# Patient Record
Sex: Male | Born: 1950 | Race: Black or African American | Hispanic: No | Marital: Married | Smoking: Former smoker
Health system: Southern US, Community
[De-identification: ages and names within clinical notes are randomized; demographics above are authoritative.]

## PROBLEM LIST (undated history)

## (undated) DIAGNOSIS — Z87891 Personal history of nicotine dependence: Secondary | ICD-10-CM

## (undated) DIAGNOSIS — M1990 Unspecified osteoarthritis, unspecified site: Secondary | ICD-10-CM

## (undated) DIAGNOSIS — Z550 Illiteracy and low-level literacy: Secondary | ICD-10-CM

## (undated) DIAGNOSIS — I10 Essential (primary) hypertension: Secondary | ICD-10-CM

## (undated) DIAGNOSIS — Z860100 Personal history of colon polyps, unspecified: Secondary | ICD-10-CM

## (undated) DIAGNOSIS — I442 Atrioventricular block, complete: Secondary | ICD-10-CM

## (undated) DIAGNOSIS — Z952 Presence of prosthetic heart valve: Secondary | ICD-10-CM

## (undated) DIAGNOSIS — E119 Type 2 diabetes mellitus without complications: Secondary | ICD-10-CM

## (undated) DIAGNOSIS — I2510 Atherosclerotic heart disease of native coronary artery without angina pectoris: Secondary | ICD-10-CM

## (undated) DIAGNOSIS — D649 Anemia, unspecified: Secondary | ICD-10-CM

## (undated) DIAGNOSIS — I739 Peripheral vascular disease, unspecified: Secondary | ICD-10-CM

## (undated) DIAGNOSIS — E785 Hyperlipidemia, unspecified: Secondary | ICD-10-CM

## (undated) DIAGNOSIS — K219 Gastro-esophageal reflux disease without esophagitis: Secondary | ICD-10-CM

## (undated) DIAGNOSIS — Z950 Presence of cardiac pacemaker: Secondary | ICD-10-CM

## (undated) DIAGNOSIS — Z45018 Encounter for adjustment and management of other part of cardiac pacemaker: Secondary | ICD-10-CM

## (undated) HISTORY — DX: Presence of cardiac pacemaker: Z95.0

## (undated) HISTORY — DX: Illiteracy and low-level literacy: Z55.0

## (undated) HISTORY — DX: Hyperlipidemia, unspecified: E78.5

## (undated) HISTORY — DX: Essential (primary) hypertension: I10

## (undated) HISTORY — DX: Presence of prosthetic heart valve: Z95.2

## (undated) HISTORY — DX: Personal history of colon polyps, unspecified: Z86.0100

## (undated) HISTORY — DX: Type 2 diabetes mellitus without complications: E11.9

## (undated) HISTORY — DX: Personal history of nicotine dependence: Z87.891

## (undated) HISTORY — DX: Anemia, unspecified: D64.9

## (undated) HISTORY — DX: Personal history of colonic polyps: Z86.010

## (undated) HISTORY — DX: Peripheral vascular disease, unspecified: I73.9

---
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
## 2000-08-23 ENCOUNTER — Ambulatory Visit (HOSPITAL_COMMUNITY): Admission: RE | Admit: 2000-08-23 | Discharge: 2000-08-23 | Payer: Self-pay | Admitting: Gastroenterology

## 2000-08-23 ENCOUNTER — Encounter (INDEPENDENT_AMBULATORY_CARE_PROVIDER_SITE_OTHER): Payer: Self-pay | Admitting: *Deleted

## 2006-09-06 ENCOUNTER — Emergency Department (HOSPITAL_COMMUNITY): Admission: EM | Admit: 2006-09-06 | Discharge: 2006-09-06 | Payer: Self-pay | Admitting: Family Medicine

## 2006-10-28 ENCOUNTER — Encounter: Admission: RE | Admit: 2006-10-28 | Discharge: 2006-10-28 | Payer: Self-pay | Admitting: Internal Medicine

## 2006-12-27 HISTORY — PX: COLONOSCOPY: SHX174

## 2007-01-06 LAB — HM COLONOSCOPY

## 2007-08-01 ENCOUNTER — Encounter: Admission: RE | Admit: 2007-08-01 | Discharge: 2007-08-01 | Payer: Self-pay | Admitting: Internal Medicine

## 2010-06-08 ENCOUNTER — Encounter: Admission: RE | Admit: 2010-06-08 | Discharge: 2010-06-08 | Payer: Self-pay | Admitting: Internal Medicine

## 2010-12-27 DIAGNOSIS — I10 Essential (primary) hypertension: Secondary | ICD-10-CM

## 2010-12-27 HISTORY — DX: Essential (primary) hypertension: I10

## 2011-01-17 ENCOUNTER — Encounter: Payer: Self-pay | Admitting: Internal Medicine

## 2011-05-14 NOTE — Procedures (Signed)
[place]. [facility]  Patient:    EDDRICK, DILONE                    MRN: [id number] Proc. Date: 08/23/00 Adm. Date:  [id number] Attending:  Nelda Marseille CC:         Hadassah Pais. Jeannetta Nap, M.D.   Procedure Report  PROCEDURE:  Colonoscopy with biopsy.  SURGEON:  Petra Kuba, M.D.  INDICATIONS:  Guaiac positivity.  Consent was signed after risks, benefits, methods, and options were thoroughly discussed in the office.  MEDICINES USED:  Demerol 50 and Versed 5.  DESCRIPTION OF PROCEDURE:  Rectal inspection is pertinent for small external hemorrhoids.  Digital exam was negative.  The video colonoscope was inserted and easily advanced around the colon to the cecum.  This did not require any abdominal pressure or any position changes.  The cecum was identified by the appendiceal orifice and the ileocecal valve.  In fact, the scope was inserted a short ways in the terminal ileum which was normal.  Photo documentation was obtained.  The scope was slowly withdrawn.  The prep was adequate.  There was minimal liquid stool that required washing and suctioning.  On slow withdrawal through the colon, the cecum, ascending, and transverse were normal.  In the mid descending, there was a slightly edematous fold that had somewhat polypoid appearance to it, and we went ahead and took two cold biopsies.  I doubt it was a polyp.  The scope was further withdrawn.  No other abnormalities were seen as we slowly withdrew back to the rectum.  Once back in the rectum, the scope was retroflexed and pertinent for some internal hemorrhoids.  The scope was straightened.  Anorectal pull through confirmed the above.  The scope was then reinserted a short ways of the left side of the colon.  Air was suctioned, the scope removed.  The patient tolerated the procedure well.  There was no obvious or immediate complication.  ENDOSCOPIC DIAGNOSES: 1. Internal and external  hemorrhoids. 2. Questionable tiny mid descending polyps, status post cold biopsy. 3. Otherwise within normal limits to the terminal ileum.  PLAN:  Await pathology to see if and when future colonic screening is needed. Followup p.r.n. or in six weeks to recheck guaiac symptoms, and decided any other workup and plans like a one-time upper GI small bowel follow through. Will await a CBC and certainly if _________ anemic, will proceed with workup sooner. DD:  08/23/00 TD:  08/24/00 Job: [id number]

## 2012-12-27 DIAGNOSIS — E119 Type 2 diabetes mellitus without complications: Secondary | ICD-10-CM

## 2012-12-27 HISTORY — DX: Type 2 diabetes mellitus without complications: E11.9

## 2013-12-26 ENCOUNTER — Encounter: Payer: Self-pay | Admitting: *Deleted

## 2014-01-01 ENCOUNTER — Ambulatory Visit: Payer: Self-pay | Admitting: Cardiovascular Disease

## 2014-03-25 ENCOUNTER — Encounter (HOSPITAL_COMMUNITY): Payer: Self-pay | Admitting: Pharmacy Technician

## 2014-04-02 ENCOUNTER — Ambulatory Visit (HOSPITAL_COMMUNITY)
Admission: RE | Admit: 2014-04-02 | Discharge: 2014-04-02 | Disposition: A | Discharge status: Home / Self Care | Payer: BC Managed Care – PPO | Source: Ambulatory Visit | Attending: Cardiology | Admitting: Cardiology

## 2014-04-02 ENCOUNTER — Encounter (HOSPITAL_COMMUNITY)
Admission: RE | Disposition: A | Discharge status: Home / Self Care | Payer: Self-pay | Source: Ambulatory Visit | Attending: Cardiology

## 2014-04-02 DIAGNOSIS — E785 Hyperlipidemia, unspecified: Secondary | ICD-10-CM | POA: Insufficient documentation

## 2014-04-02 DIAGNOSIS — I10 Essential (primary) hypertension: Secondary | ICD-10-CM | POA: Insufficient documentation

## 2014-04-02 DIAGNOSIS — I359 Nonrheumatic aortic valve disorder, unspecified: Secondary | ICD-10-CM | POA: Insufficient documentation

## 2014-04-02 DIAGNOSIS — Q231 Congenital insufficiency of aortic valve: Secondary | ICD-10-CM | POA: Insufficient documentation

## 2014-04-02 HISTORY — PX: LEFT AND RIGHT HEART CATHETERIZATION WITH CORONARY ANGIOGRAM: SHX5449

## 2014-04-02 LAB — POCT I-STAT 3, VENOUS BLOOD GAS (G3P V)
Acid-base deficit: 4 mmol/L — ABNORMAL HIGH (ref 0.0–2.0)
Bicarbonate: 21.2 mEq/L (ref 20.0–24.0)
O2 Saturation: 64 %
TCO2: 22 mmol/L (ref 0–100)
pCO2, Ven: 40 mmHg — ABNORMAL LOW (ref 45.0–50.0)
pH, Ven: 7.332 — ABNORMAL HIGH (ref 7.250–7.300)
pO2, Ven: 36 mmHg (ref 30.0–45.0)

## 2014-04-02 LAB — POCT I-STAT 3, ART BLOOD GAS (G3+)
Acid-base deficit: 5 mmol/L — ABNORMAL HIGH (ref 0.0–2.0)
Acid-base deficit: 5 mmol/L — ABNORMAL HIGH (ref 0.0–2.0)
Bicarbonate: 19.5 mEq/L — ABNORMAL LOW (ref 20.0–24.0)
Bicarbonate: 20.6 mEq/L (ref 20.0–24.0)
O2 Saturation: 69 %
O2 Saturation: 99 %
PCO2 ART: 38.6 mmHg (ref 35.0–45.0)
PH ART: 7.334 — AB (ref 7.350–7.450)
TCO2: 21 mmol/L (ref 0–100)
TCO2: 22 mmol/L (ref 0–100)
pCO2 arterial: 34.3 mmHg — ABNORMAL LOW (ref 35.0–45.0)
pH, Arterial: 7.362 (ref 7.350–7.450)
pO2, Arterial: 119 mmHg — ABNORMAL HIGH (ref 80.0–100.0)
pO2, Arterial: 38 mmHg — CL (ref 80.0–100.0)

## 2014-04-02 LAB — GLUCOSE, CAPILLARY: GLUCOSE-CAPILLARY: 130 mg/dL — AB (ref 70–99)

## 2014-04-02 SURGERY — LEFT AND RIGHT HEART CATHETERIZATION WITH CORONARY ANGIOGRAM
Anesthesia: LOCAL

## 2014-04-02 MED ORDER — MIDAZOLAM HCL 2 MG/2ML IJ SOLN
INTRAMUSCULAR | Status: AC
  Filled 2014-04-02: qty 2

## 2014-04-02 MED ORDER — ASPIRIN 81 MG PO CHEW
81.0000 mg | CHEWABLE_TABLET | ORAL | Status: AC
  Administered 2014-04-02: 81 mg via ORAL
  Filled 2014-04-02: qty 1

## 2014-04-02 MED ORDER — NITROGLYCERIN 0.2 MG/ML ON CALL CATH LAB
INTRAVENOUS | Status: AC
  Filled 2014-04-02: qty 1

## 2014-04-02 MED ORDER — SODIUM CHLORIDE 0.9 % IJ SOLN: 3.0000 mL | INTRAMUSCULAR | Status: DC | PRN

## 2014-04-02 MED ORDER — SODIUM CHLORIDE 0.9 % IV SOLN
INTRAVENOUS | Status: DC
  Administered 2014-04-02: 10:00:00 via INTRAVENOUS

## 2014-04-02 MED ORDER — LIDOCAINE HCL (PF) 1 % IJ SOLN
INTRAMUSCULAR | Status: AC
  Filled 2014-04-02: qty 30

## 2014-04-02 MED ORDER — HEPARIN (PORCINE) IN NACL 2-0.9 UNIT/ML-% IJ SOLN
INTRAMUSCULAR | Status: AC
  Filled 2014-04-02: qty 1000

## 2014-04-02 MED ORDER — SODIUM CHLORIDE 0.9 % IJ SOLN
3.0000 mL | Freq: Two times a day (BID) | INTRAMUSCULAR | Status: DC
Start: 2014-04-02 — End: 2014-04-02
  Administered 2014-04-02: 3 mL via INTRAVENOUS

## 2014-04-02 MED ORDER — SODIUM CHLORIDE 0.9 % IV BOLUS (SEPSIS)
500.0000 mL | Freq: Once | INTRAVENOUS | Status: AC
  Administered 2014-04-02: 09:00:00 via INTRAVENOUS

## 2014-04-02 MED ORDER — SODIUM CHLORIDE 0.9 % IV SOLN: 250.0000 mL | INTRAVENOUS | Status: DC | PRN

## 2014-04-02 MED ORDER — VERAPAMIL HCL 2.5 MG/ML IV SOLN
INTRAVENOUS | Status: AC
Start: 2014-04-02 — End: 2014-04-02
  Filled 2014-04-02: qty 2

## 2014-04-02 MED ORDER — HYDROMORPHONE HCL PF 1 MG/ML IJ SOLN
INTRAMUSCULAR | Status: AC
  Filled 2014-04-02: qty 1

## 2014-04-02 MED ORDER — SODIUM CHLORIDE 0.9 % IV SOLN
1.0000 mL/kg/h | INTRAVENOUS | Status: DC
Start: 2014-04-02 — End: 2014-04-02

## 2014-04-02 MED ORDER — HEPARIN SODIUM (PORCINE) 1000 UNIT/ML IJ SOLN
INTRAMUSCULAR | Status: AC
  Filled 2014-04-02: qty 1

## 2014-04-02 MED ORDER — METFORMIN HCL 500 MG PO TABS
500.0000 mg | ORAL_TABLET | Freq: Two times a day (BID) | ORAL | Status: DC
Start: 2014-04-04 — End: 2016-02-03

## 2014-04-02 NOTE — H&P (Signed)
  Please see office visit notes for complete details of HPI.  

## 2014-04-02 NOTE — Discharge Instructions (Signed)
° °  HOLD METFORMIN FOR 48 HOURS ° ° °Radial Site Care °Refer to this sheet in the next few weeks. These instructions provide you with information on caring for yourself after your procedure. Your caregiver may also give you more specific instructions. Your treatment has been planned according to current medical practices, but problems sometimes occur. Call your caregiver if you have any problems or questions after your procedure. °HOME CARE INSTRUCTIONS °· You may shower the day after the procedure. Remove the bandage (dressing) and gently wash the site with plain soap and water. Gently pat the site dry. °· Do not apply powder or lotion to the site. °· Do not submerge the affected site in water for 3 to 5 days. °· Inspect the site at least twice daily. °· Do not flex or bend the affected arm for 24 hours. °· No lifting over 5 pounds (2.3 kg) for 5 days after your procedure. °· Do not drive home if you are discharged the same day of the procedure. Have someone else drive you. °· You may drive 24 hours after the procedure unless otherwise instructed by your caregiver. °· Do not operate machinery or power tools for 24 hours. °· A responsible adult should be with you for the first 24 hours after you arrive home. °What to expect: °· Any bruising will usually fade within 1 to 2 weeks. °· Blood that collects in the tissue (hematoma) may be painful to the touch. It should usually decrease in size and tenderness within 1 to 2 weeks. °SEEK IMMEDIATE MEDICAL CARE IF: °· You have unusual pain at the radial site. °· You have redness, warmth, swelling, or pain at the radial site. °· You have drainage (other than a small amount of blood on the dressing). °· You have chills. °· You have a fever or persistent symptoms for more than 72 hours. °· You have a fever and your symptoms suddenly get worse. °· Your arm becomes pale, cool, tingly, or numb. °· You have heavy bleeding from the site. Hold pressure on the site. °Document  Released: 01/15/2011 Document Revised: 03/06/2012 Document Reviewed: 01/15/2011 °ExitCare® Patient Information ©2014 [employer]. ° °

## 2014-04-02 NOTE — Interval H&P Note (Signed)
History and Physical Interval Note:  04/02/2014 11:50 AM  Jonathan Brown  has presented today for surgery, with the diagnosis of c/p  The various methods of treatment have been discussed with the patient and family. After consideration of risks, benefits and other options for treatment, the patient has consented to  Procedure(s): LEFT AND RIGHT HEART CATHETERIZATION WITH CORONARY ANGIOGRAM (N/A) as a surgical intervention .  The patient's history has been reviewed, patient examined, no change in status, stable for surgery.  I have reviewed the patient's chart and labs.  Questions were answered to the patient's satisfaction.     Pamella PertGANJI,JAGADEESH R

## 2014-04-02 NOTE — CV Procedure (Addendum)
Procedures performed: Right and left heart catheterization, coronary arteriograms only without left ventriculogram and calculation of cardiac output and cardiac index by Fick. Right radial arterial access and right antecubital vein access was utilized for performing the procedure. Ascending aortogram and descending thoracic aortogram.  Indication: Patient is a 63 year-old [place] American male with hyperlipidemia, hypertension,  severe aortic stenosis by outpatient 2-D echocardiogram and abnormal stress test in the inferior wall. He presents with shortness of breath and dyspnea and exertion. Patient will need aortic valve replacement and hence also set up for coronary angiography and also aortogram to evaluate his anatomy. Aortogram was performed to evaluate for feasibility for minimally invasive surgery.  Procedural data:  RA pressure 5/5  Mean 3 mm mercury. RA saturation 69 %.  RV pressure 24/5 and Right ventricular EDP 6 mm Hg. PA pressure 27/11 with a mean of 19  mm mercury. PA saturation 64 %.  Pulmonary capillary wedge 7/6 with a mean of 5 mm Hg. Aortic saturation 99 %.  Cardiac output was 4.54 with cardiac index of 2.25, normal  by Fick.   Angiographic data  Left ventricle: NOT performed.    Right coronary artery: Smooth, dominant and has mild diffuse luminal irregularity.   Left main coronary artery: Normal. No stenosis.    LAD: Large, has mild diffuse luminal irregularity and mild proximal coronary calcification.  Circumflex coronary artery: Has mild diffuse luminal irregularity.  Ramus intermediate 1: Large vessel with mild disease in the proximal segment. Next  Ramus intermediate 2: Large vessel with midsegment showing very minimal luminal irregularity.  Ascending aortogram: The aortic valve was moderate to severely calcified. Bicuspid aortic valve. No significant aortic regurgitation. There is no evidence of ascending aortic aneurysm.   Descending thoracic aortogram:  Descending thoracic aorta was normal. Abdominal aorta was also well visualized. The mesenteric vessels were widely patent including the renal arteries. The femoral artery is widely patent bilaterally in the proximal segment, distal vessel not visualized.  Impression: Normal right heart catheterizaton with very mild coronary artery luminal irregularity, no significant stenosis. Left ventriculogram was not performed, aortic valve area not palpated due to inability to cross the aortic valve. No significant attempt was made to cross the aortic valve as mean gradient by transthoracic echocardiogram was suggestive of severe aortic stenosis.   Recommendation: Patient will be set up for a outpatient TEE to confirm the stenosis severity of the aortic valve. Once confirmed, he will need to be referred for aortic valve replacement.  Technique: A 5 French brachial short sheath introduced into the right antecubital vein access. A 5 French Swan-Ganz catheter was advanced with balloon inflated on the sheath under fluoroscopic guidance along with a 0.025 inch J-wire for support into first the right atrium followed by the right ventricle and into the pulmonary artery to pulmonary artery wedge position. Hemodynamics were obtained in a locations.  After hemodynamics were completed, samples were taken for SaO2% measurement to be used in [facility] /Index catheterization.  The catheter was then pulled back the balloon down and then placed in the axillary vein and we proceeded with completion of left heart catheterization.  Left Heart Catheterization   Right radial arterial access was obtained in a 6 French sheath was introduced into the right radial artery. A 5 [place]  Tig 4 diagnostic catheter was utilized over a J-wire 2 advanced into the descending aorta and selective right and left coronary arteriograms was performed. The same catheter was then utilized to perform a sling and aortic  root arteriogram and descending thoracic and  abdominal aortogram. The catheter was then pulled out of the body over J-wire. Hemostasis was obtained by applying TR band of the arterial access site. Venous hemostasis was obtained by applying manual pressure. There was no immediate complication.  The catheter was then removed the body over wire. All exchanges were made over standard J wire.

## 2014-04-30 ENCOUNTER — Ambulatory Visit (HOSPITAL_COMMUNITY)
Admission: RE | Admit: 2014-04-30 | Discharge: 2014-04-30 | Disposition: A | Discharge status: Home / Self Care | Payer: BC Managed Care – PPO | Source: Ambulatory Visit | Attending: Cardiology | Admitting: Cardiology

## 2014-04-30 ENCOUNTER — Encounter (HOSPITAL_COMMUNITY)
Admission: RE | Disposition: A | Discharge status: Home / Self Care | Payer: Self-pay | Source: Ambulatory Visit | Attending: Cardiology

## 2014-04-30 ENCOUNTER — Encounter (HOSPITAL_COMMUNITY): Payer: Self-pay

## 2014-04-30 DIAGNOSIS — I359 Nonrheumatic aortic valve disorder, unspecified: Secondary | ICD-10-CM | POA: Insufficient documentation

## 2014-04-30 HISTORY — DX: Gastro-esophageal reflux disease without esophagitis: K21.9

## 2014-04-30 HISTORY — PX: TEE WITHOUT CARDIOVERSION: SHX5443

## 2014-04-30 LAB — GLUCOSE, CAPILLARY: Glucose-Capillary: 94 mg/dL (ref 70–99)

## 2014-04-30 SURGERY — ECHOCARDIOGRAM, TRANSESOPHAGEAL
Anesthesia: Moderate Sedation

## 2014-04-30 MED ORDER — FENTANYL CITRATE 0.05 MG/ML IJ SOLN
INTRAMUSCULAR | Status: DC | PRN
  Administered 2014-04-30 (×2): 25 ug via INTRAVENOUS

## 2014-04-30 MED ORDER — SODIUM CHLORIDE 0.9 % IV SOLN
INTRAVENOUS | Status: DC
  Administered 2014-04-30: 500 mL via INTRAVENOUS

## 2014-04-30 MED ORDER — BUTAMBEN-TETRACAINE-BENZOCAINE 2-2-14 % EX AERO
INHALATION_SPRAY | CUTANEOUS | Status: DC | PRN
  Administered 2014-04-30: 2 via TOPICAL

## 2014-04-30 MED ORDER — FENTANYL CITRATE 0.05 MG/ML IJ SOLN
INTRAMUSCULAR | Status: AC
  Filled 2014-04-30: qty 2

## 2014-04-30 MED ORDER — MIDAZOLAM HCL 10 MG/2ML IJ SOLN
INTRAMUSCULAR | Status: DC | PRN
Start: 2014-04-30 — End: 2014-04-30
  Administered 2014-04-30 (×2): 2 mg via INTRAVENOUS

## 2014-04-30 MED ORDER — MIDAZOLAM HCL 5 MG/ML IJ SOLN
INTRAMUSCULAR | Status: AC
  Filled 2014-04-30: qty 1

## 2014-04-30 NOTE — Interval H&P Note (Signed)
History and Physical Interval Note:  04/30/2014 11:21 AM  Jonathan Brown  has presented today for surgery, with the diagnosis of AORTIC STENOSIS  The various methods of treatment have been discussed with the patient and family. After consideration of risks, benefits and other options for treatment, the patient has consented to  Procedure(s): TRANSESOPHAGEAL ECHOCARDIOGRAM (TEE) (N/A) as a surgical intervention .  The patient's history has been reviewed, patient examined, no change in status, stable for surgery.  I have reviewed the patient's chart and labs.  Questions were answered to the patient's satisfaction.   Patient has no new symptoms. No changes in medications. Physical exam unchanged and stable for the procedure. Has severe aortic stenosis by TTE.   Pamella PertJagadeesh R Camielle Sizer

## 2014-04-30 NOTE — CV Procedure (Signed)
Normal LVEF. Mod LVH. Mild mitral anular calcification. Mild MR, Trace TR. Severe AS. Mean PG of 51 mm Hg. AVA 0.6cm2. Bicuspid and calcified aortic valve.

## 2014-04-30 NOTE — H&P (View-Only) (Signed)
  Please see office visit notes for complete details of HPI.  

## 2014-04-30 NOTE — Progress Notes (Signed)
  Echocardiogram Echocardiogram Transesophageal has been performed.  Hermine MessickLauren A Williams 04/30/2014, 12:01 PM

## 2014-04-30 NOTE — Discharge Instructions (Addendum)
Moderate Sedation, Adult °Moderate sedation is given to help you relax or even sleep through a procedure. You may remain sleepy, be clumsy, or have poor balance for several hours following this procedure. Arrange for a responsible adult, family member, or friend to take you home. A responsible adult should stay with you for at least 24 hours or until the medicines have worn off. °· Do not participate in any activities where you could become injured for the next 24 hours, or until you feel normal again. Do not: °· Drive. °· Swim. °· Ride a bicycle. °· Operate heavy machinery. °· Cook. °· Use power tools. °· Climb ladders. °· Work at heights. °· Do not make important decisions or sign legal documents until you are improved. °· Vomiting may occur if you eat too soon. When you can drink without vomiting, try water, juice, or soup. Try solid foods if you feel little or no nausea. °· Only take over-the-counter or prescription medications for pain, discomfort, or fever as directed by your caregiver.If pain medications have been prescribed for you, ask your caregiver how soon it is safe to take them. °· Make sure you and your family fully understands everything about the medication given to you. Make sure you understand what side effects may occur. °· You should not drink alcohol, take sleeping pills, or medications that cause drowsiness for at least 24 hours. °· If you smoke, do not smoke alone. °· If you are feeling better, you may resume normal activities 24 hours after receiving sedation. °· Keep all appointments as scheduled. Follow all instructions. °· Ask questions if you do not understand. °SEEK MEDICAL CARE IF:  °· Your skin is pale or bluish in color. °· You continue to feel sick to your stomach (nauseous) or throw up (vomit). °· Your pain is getting worse and not helped by medication. °· You have bleeding or swelling. °· You are still sleepy or feeling clumsy after 24 hours. °SEEK IMMEDIATE MEDICAL CARE IF:   °· You develop a rash. °· You have difficulty breathing. °· You develop any type of allergic problem. °· You have a fever. °Document Released: 09/07/2001 Document Revised: 03/06/2012 Document Reviewed: 08/20/2013 °ExitCare® Patient Information ©2014 [employer]. ° °

## 2014-05-01 ENCOUNTER — Encounter (HOSPITAL_COMMUNITY): Payer: Self-pay | Admitting: Cardiology

## 2014-12-05 ENCOUNTER — Encounter (HOSPITAL_COMMUNITY): Payer: Self-pay | Admitting: Cardiology

## 2016-02-03 ENCOUNTER — Ambulatory Visit (INDEPENDENT_AMBULATORY_CARE_PROVIDER_SITE_OTHER): Payer: BLUE CROSS/BLUE SHIELD | Admitting: Medical

## 2016-02-03 ENCOUNTER — Encounter: Payer: Self-pay | Admitting: Medical

## 2016-02-03 VITALS — BP 110/78 | HR 74 | Ht 66.75 in | Wt 203.0 lb

## 2016-02-03 DIAGNOSIS — I10 Essential (primary) hypertension: Secondary | ICD-10-CM | POA: Diagnosis not present

## 2016-02-03 DIAGNOSIS — K219 Gastro-esophageal reflux disease without esophagitis: Secondary | ICD-10-CM | POA: Diagnosis not present

## 2016-02-03 DIAGNOSIS — E1141 Type 2 diabetes mellitus with diabetic mononeuropathy: Secondary | ICD-10-CM | POA: Diagnosis not present

## 2016-02-03 DIAGNOSIS — E118 Type 2 diabetes mellitus with unspecified complications: Secondary | ICD-10-CM

## 2016-02-03 DIAGNOSIS — Z23 Encounter for immunization: Secondary | ICD-10-CM | POA: Diagnosis not present

## 2016-02-03 DIAGNOSIS — I70213 Atherosclerosis of native arteries of extremities with intermittent claudication, bilateral legs: Secondary | ICD-10-CM | POA: Diagnosis not present

## 2016-02-03 DIAGNOSIS — I350 Nonrheumatic aortic (valve) stenosis: Secondary | ICD-10-CM | POA: Diagnosis not present

## 2016-02-03 DIAGNOSIS — I25119 Atherosclerotic heart disease of native coronary artery with unspecified angina pectoris: Secondary | ICD-10-CM | POA: Diagnosis not present

## 2016-02-03 DIAGNOSIS — E785 Hyperlipidemia, unspecified: Secondary | ICD-10-CM | POA: Diagnosis not present

## 2016-02-03 LAB — CBC
HCT: 41.9 % (ref 39.0–52.0)
Hemoglobin: 14 g/dL (ref 13.0–17.0)
MCH: 29.8 pg (ref 26.0–34.0)
MCHC: 33.4 g/dL (ref 30.0–36.0)
MCV: 89.1 fL (ref 78.0–100.0)
MPV: 11.2 fL (ref 8.6–12.4)
PLATELETS: 167 10*3/uL (ref 150–400)
RBC: 4.7 MIL/uL (ref 4.22–5.81)
RDW: 13.1 % (ref 11.5–15.5)
WBC: 7.6 10*3/uL (ref 4.0–10.5)

## 2016-02-03 MED ORDER — AMLODIPINE-OLMESARTAN 10-40 MG PO TABS: 1.0000 | ORAL_TABLET | Freq: Every day | ORAL | Status: DC

## 2016-02-03 MED ORDER — GABAPENTIN 100 MG PO CAPS: 100.0000 mg | ORAL_CAPSULE | Freq: Every day | ORAL | Status: DC

## 2016-02-03 MED ORDER — METOPROLOL TARTRATE 25 MG PO TABS: 25.0000 mg | ORAL_TABLET | Freq: Two times a day (BID) | ORAL | Status: DC

## 2016-02-03 MED ORDER — METFORMIN HCL 500 MG PO TABS: 500.0000 mg | ORAL_TABLET | Freq: Two times a day (BID) | ORAL | Status: DC

## 2016-02-03 MED ORDER — ROSUVASTATIN CALCIUM 20 MG PO TABS
20.0000 mg | ORAL_TABLET | Freq: Every day | ORAL | Status: DC
Start: 2016-02-03 — End: 2016-06-23

## 2016-02-03 MED ORDER — ESOMEPRAZOLE MAGNESIUM 40 MG PO CPDR: 40.0000 mg | DELAYED_RELEASE_CAPSULE | Freq: Every day | ORAL | Status: DC

## 2016-02-03 NOTE — Addendum Note (Signed)
Addended by: Kieth Brightly on: 02/03/2016 10:33 AM   Modules accepted: Orders

## 2016-02-03 NOTE — Progress Notes (Signed)
Subjective: Chief Complaint  Patient presents with  . New Patient (Initial Visit)    do not have records yet. brought medications. sees no other doctors. will get flu shot at the end of visit.   . Medication Refill   Here as a new patient.  Wife is in the lobby. Was seeing health dept for refills, but now has [employer].   Here for medication refills, follow up on chronic issues.   He notes hx/o diabetes, high cholesterol, high blood pressure, and hx/o "blockages" on prior cath.   hasn't seen ccardiology2 years, but in passing notes getting some pains in legs when he walks for a block, relieved with rest. Also gets some pains in the chest from time to time radiating to left arm.   Currently denies chest pain, edema, SOB.   Overall says he is pretty hhealthy  On the one hand he notes eating healthy diabetic diet, but then notes that he and his wife really love Pepsi Cola.  No other aggravating or relieving factors. No other complaint.  Past Medical History  Diagnosis Date  . GERD (gastroesophageal reflux disease)   . Hyperlipidemia   . Diabetes mellitus without complication (HCC) 2014  . Hypertension 2012    Past Surgical History  Procedure Laterality Date  . Tee without cardioversion N/A 04/30/2014    Procedure: TRANSESOPHAGEAL ECHOCARDIOGRAM (TEE);  Surgeon: Pamella Pert, MD;  Location: [facility] ENDOSCOPY;  Service: Cardiovascular;  Laterality: N/A;  . Left and right heart catheterization with coronary angiogram N/A 04/02/2014    Procedure: LEFT AND RIGHT HEART CATHETERIZATION WITH CORONARY ANGIOGRAM;  Surgeon: Pamella Pert, MD;  Location: [facility] CATH LAB;  Service: Cardiovascular;  Laterality: N/A;    Social History   Social History  . Marital Status: Married    Spouse Name: N/A  . Number of Children: N/A  . Years of Education: N/A   Occupational History  . Not on file.   Social History Main Topics  . Smoking status: Former Games developer  . Smokeless tobacco: Never Used  . Alcohol  Use: No  . Drug Use: No  . Sexual Activity: Not on file   Other Topics Concern  . Not on file   Social History Narrative    History reviewed. No pertinent family history.   Current outpatient prescriptions:  .  amLODipine-olmesartan (AZOR) 10-40 MG tablet, Take 1 tablet by mouth daily., Disp: 90 tablet, Rfl: 1 .  aspirin 81 MG tablet, Take 81 mg by mouth daily., Disp: , Rfl:  .  esomeprazole (NEXIUM) 40 MG capsule, Take 1 capsule (40 mg total) by mouth daily at 12 noon., Disp: 90 capsule, Rfl: 1 .  metFORMIN (GLUCOPHAGE) 500 MG tablet, Take 1 tablet (500 mg total) by mouth 2 (two) times daily with a meal., Disp: 180 tablet, Rfl: 0 .  metoprolol tartrate (LOPRESSOR) 25 MG tablet, Take 1 tablet (25 mg total) by mouth 2 (two) times daily., Disp: 180 tablet, Rfl: 1 .  rosuvastatin (CRESTOR) 20 MG tablet, Take 1 tablet (20 mg total) by mouth daily., Disp: 90 tablet, Rfl: 1 .  gabapentin (NEURONTIN) 100 MG capsule, Take 1 capsule (100 mg total) by mouth at bedtime. Reported on 02/03/2016, Disp: 90 capsule, Rfl: 1 .  metoprolol succinate (TOPROL-XL) 25 MG 24 hr tablet, Take 25 mg by mouth daily. Reported on 02/03/2016, Disp: , Rfl:   No Known Allergies   ROS as in subjective   Objective: BP 110/78 mmHg  Pulse 74  Ht 5'  6.75" (1.695 m)  Wt 203 lb (92.08 kg)  BMI 32.05 kg/m2  General appearance: alert, no distress, WD/WN, AA male Oral cavity: MMM, no lesions Neck: supple, no lymphadenopathy, no thyromegaly, no masses, no bruits Heart: 3/6 holosystolic blowing murmur, RRR, normal S1, S2, no murmurs Lungs: CTA bilaterally, no wheezes, rhonchi, or rales 1+ pedal pulses, faint Psych: pleasant, good eye contact, answers questions appropriately   Adult ECG Report  Indication: claudication, dyspnea  Rate: 63 bpm  Rhythm: normal sinus rhythm  QRS Axis: -65 degrees  PR Interval:  QRS Duration:  QTc:  Conduction Disturbances: right bundle branch block and left anterior  fascicular block  Other Abnormalities: T wave inversion III, strain pattern  Patient's cardiac risk factors are: advanced age (older than 38 for men, 36 for women), diabetes mellitus, dyslipidemia, hypertension and male gender.  EKG comparison: 2015, unchanged  Narrative Interpretation: abnormal EKG, but no change from 2015   Assessment: Encounter Diagnoses  Name Primary?  . Type 2 diabetes mellitus with complication, without long-term current use of insulin (HCC) Yes  . Essential hypertension   . Hyperlipidemia   . Gastroesophageal reflux disease without esophagitis   . 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 Need for prophylactic vaccination and inoculation against influenza   . Coronary artery disease involving native coronary artery of native heart with angina pectoris (HCC)   . Atherosclerosis of native artery of both lower extremities with intermittent claudication (HCC)   . Aortic stenosis    Plan: Given our conversation today, I suspect some degree of lower than average cognitive function/lower mental functioning, but not sure.  Not necessarily MR.  He was able to give relatively good history. Counseled on the influenza virus vaccine.  Vaccine information sheet given.  Influenza vaccine given after consent obtained. Refills today on medications routine labs today Discussed diabetic diet, exercise, and cutting out pepsi Given the cardiac symptoms, referral back to Dr. Jacinto Halim for updated eval.   reviewed cath info and EKG from 2015.

## 2016-02-04 ENCOUNTER — Other Ambulatory Visit: Payer: Self-pay | Admitting: Medical

## 2016-02-04 LAB — COMPREHENSIVE METABOLIC PANEL
ALK PHOS: 82 U/L (ref 40–115)
ALT: 33 U/L (ref 9–46)
AST: 17 U/L (ref 10–35)
Albumin: 4.1 g/dL (ref 3.6–5.1)
BUN: 13 mg/dL (ref 7–25)
CO2: 23 mmol/L (ref 20–31)
CREATININE: 1.04 mg/dL (ref 0.70–1.25)
Calcium: 9.3 mg/dL (ref 8.6–10.3)
Chloride: 108 mmol/L (ref 98–110)
GLUCOSE: 161 mg/dL — AB (ref 65–99)
POTASSIUM: 4.5 mmol/L (ref 3.5–5.3)
SODIUM: 139 mmol/L (ref 135–146)
TOTAL PROTEIN: 6.8 g/dL (ref 6.1–8.1)
Total Bilirubin: 0.5 mg/dL (ref 0.2–1.2)

## 2016-02-04 LAB — LIPID PANEL
Cholesterol: 149 mg/dL (ref 125–200)
HDL: 32 mg/dL — ABNORMAL LOW (ref >=40)
LDL Cholesterol: 80 mg/dL (ref <130)
Total CHOL/HDL Ratio: 4.7 Ratio (ref <=5.0)
Triglycerides: 185 mg/dL — ABNORMAL HIGH (ref <150)
VLDL: 37 mg/dL — ABNORMAL HIGH (ref <30)

## 2016-02-04 LAB — MICROALBUMIN / CREATININE URINE RATIO
Creatinine, Urine: 170 mg/dL (ref 20–370)
Microalb Creat Ratio: 11 mcg/mg creat (ref <30)
Microalb, Ur: 1.8 mg/dL

## 2016-02-04 LAB — HEMOGLOBIN A1C
Hgb A1c MFr Bld: 6.2 % — ABNORMAL HIGH (ref <5.7)
MEAN PLASMA GLUCOSE: 131 mg/dL — AB (ref <117)

## 2016-06-15 ENCOUNTER — Other Ambulatory Visit: Payer: Self-pay | Admitting: Medical

## 2016-06-17 ENCOUNTER — Telehealth: Payer: Self-pay | Admitting: Medical

## 2016-06-17 DIAGNOSIS — E118 Type 2 diabetes mellitus with unspecified complications: Secondary | ICD-10-CM

## 2016-06-17 MED ORDER — METFORMIN HCL 500 MG PO TABS: 500.0000 mg | ORAL_TABLET | Freq: Two times a day (BID) | ORAL | Status: DC

## 2016-06-17 NOTE — Telephone Encounter (Signed)
done

## 2016-06-17 NOTE — Telephone Encounter (Signed)
Pt called and needs a refill on his metformin pt made a diabetes check for 06/23/2016 pt uses [id number] - [place], [place] - [address]

## 2016-06-23 ENCOUNTER — Encounter: Payer: Self-pay | Admitting: Medical

## 2016-06-23 ENCOUNTER — Ambulatory Visit (INDEPENDENT_AMBULATORY_CARE_PROVIDER_SITE_OTHER): Payer: BLUE CROSS/BLUE SHIELD | Admitting: Medical

## 2016-06-23 VITALS — BP 126/84 | HR 64 | Wt 197.0 lb

## 2016-06-23 DIAGNOSIS — R9389 Abnormal findings on diagnostic imaging of other specified body structures: Secondary | ICD-10-CM

## 2016-06-23 DIAGNOSIS — K219 Gastro-esophageal reflux disease without esophagitis: Secondary | ICD-10-CM

## 2016-06-23 DIAGNOSIS — G4710 Hypersomnia, unspecified: Secondary | ICD-10-CM | POA: Diagnosis not present

## 2016-06-23 DIAGNOSIS — Z7189 Other specified counseling: Secondary | ICD-10-CM | POA: Diagnosis not present

## 2016-06-23 DIAGNOSIS — R062 Wheezing: Secondary | ICD-10-CM | POA: Diagnosis not present

## 2016-06-23 DIAGNOSIS — E1141 Type 2 diabetes mellitus with diabetic mononeuropathy: Secondary | ICD-10-CM

## 2016-06-23 DIAGNOSIS — E785 Hyperlipidemia, unspecified: Secondary | ICD-10-CM | POA: Diagnosis not present

## 2016-06-23 DIAGNOSIS — I70213 Atherosclerosis of native arteries of extremities with intermittent claudication, bilateral legs: Secondary | ICD-10-CM

## 2016-06-23 DIAGNOSIS — I25119 Atherosclerotic heart disease of native coronary artery with unspecified angina pectoris: Secondary | ICD-10-CM | POA: Diagnosis not present

## 2016-06-23 DIAGNOSIS — R938 Abnormal findings on diagnostic imaging of other specified body structures: Secondary | ICD-10-CM

## 2016-06-23 DIAGNOSIS — E118 Type 2 diabetes mellitus with unspecified complications: Secondary | ICD-10-CM

## 2016-06-23 DIAGNOSIS — I350 Nonrheumatic aortic (valve) stenosis: Secondary | ICD-10-CM

## 2016-06-23 DIAGNOSIS — I10 Essential (primary) hypertension: Secondary | ICD-10-CM

## 2016-06-23 DIAGNOSIS — R400 Somnolence: Secondary | ICD-10-CM

## 2016-06-23 DIAGNOSIS — Z7185 Encounter for immunization safety counseling: Secondary | ICD-10-CM

## 2016-06-23 LAB — COMPREHENSIVE METABOLIC PANEL
ALBUMIN: 4.1 g/dL (ref 3.6–5.1)
ALT: 20 U/L (ref 9–46)
AST: 16 U/L (ref 10–35)
Alkaline Phosphatase: 78 U/L (ref 40–115)
BUN: 13 mg/dL (ref 7–25)
CHLORIDE: 107 mmol/L (ref 98–110)
CO2: 19 mmol/L — AB (ref 20–31)
CREATININE: 1.05 mg/dL (ref 0.70–1.25)
Calcium: 9.4 mg/dL (ref 8.6–10.3)
Glucose, Bld: 82 mg/dL (ref 65–99)
POTASSIUM: 4.3 mmol/L (ref 3.5–5.3)
SODIUM: 137 mmol/L (ref 135–146)
Total Bilirubin: 0.4 mg/dL (ref 0.2–1.2)
Total Protein: 6.6 g/dL (ref 6.1–8.1)

## 2016-06-23 MED ORDER — METOPROLOL TARTRATE 25 MG PO TABS: 25.0000 mg | ORAL_TABLET | Freq: Two times a day (BID) | ORAL | Status: DC

## 2016-06-23 MED ORDER — ROSUVASTATIN CALCIUM 20 MG PO TABS: 20.0000 mg | ORAL_TABLET | Freq: Every day | ORAL | Status: DC

## 2016-06-23 MED ORDER — ALBUTEROL SULFATE HFA 108 (90 BASE) MCG/ACT IN AERS: 2.0000 | INHALATION_SPRAY | Freq: Four times a day (QID) | RESPIRATORY_TRACT | Status: DC | PRN

## 2016-06-23 MED ORDER — GABAPENTIN 100 MG PO CAPS: 100.0000 mg | ORAL_CAPSULE | Freq: Every day | ORAL | Status: DC

## 2016-06-23 MED ORDER — METFORMIN HCL 500 MG PO TABS: 500.0000 mg | ORAL_TABLET | Freq: Two times a day (BID) | ORAL | Status: DC

## 2016-06-23 MED ORDER — ASPIRIN 81 MG PO TABS: 81.0000 mg | ORAL_TABLET | Freq: Every day | ORAL | Status: DC

## 2016-06-23 MED ORDER — ESOMEPRAZOLE MAGNESIUM 40 MG PO CPDR: 40.0000 mg | DELAYED_RELEASE_CAPSULE | Freq: Every day | ORAL | Status: DC

## 2016-06-23 MED ORDER — LORATADINE 10 MG PO TABS: 10.0000 mg | ORAL_TABLET | Freq: Every day | ORAL | Status: DC

## 2016-06-23 MED ORDER — AMLODIPINE-OLMESARTAN 10-40 MG PO TABS: 1.0000 | ORAL_TABLET | Freq: Every day | ORAL | Status: DC

## 2016-06-23 NOTE — Progress Notes (Signed)
Subjective: Chief Complaint  Patient presents with  . Diabetes    needs refills. has not been checking sugar.    Here for 34mo f/u. I saw him as a new patient in February.    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  Married.   Vaccines - no prior shingles vaccine, had Td 2 years ago.  Thinks he may have had pneumococcal vaccine 4-5 years ago.  Lately staying rattly in chest, congestion in chest x 67mo.   Nonsmoker.  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    Here for medication refills, follow up on chronic issues.   He notes hx/o diabetes, high cholesterol, high blood pressure, and hx/o "blockages" on prior cath.  Currently denies chest pain, edema, SOB.   Hasn't seen Dr. Jacinto HalimGanji in follow up yet, although we referred last visit.   No other aggravating or relieving factors. No other complaint.  Past Medical History  Diagnosis Date  . GERD (gastroesophageal reflux disease)   . Hyperlipidemia   . Diabetes mellitus without complication (HCC) 2014  . Hypertension 2012    Past Surgical History  Procedure Laterality Date  . Tee without cardioversion N/A 04/30/2014    Procedure: TRANSESOPHAGEAL ECHOCARDIOGRAM (TEE);  Surgeon: Pamella PertJagadeesh R Ganji, MD;  [facility];  Service: Cardiovascular;  Laterality: N/A;  . Left and right heart catheterization with coronary angiogram N/A 04/02/2014    Procedure: LEFT AND RIGHT HEART CATHETERIZATION WITH CORONARY ANGIOGRAM;  Surgeon: Pamella PertJagadeesh R Ganji, MD;  [facility];  Service: Cardiovascular;  Laterality: N/A;    Social History   Social History  . Marital Status: Married    Spouse Name: N/A  . Number of Children: N/A  . Years of Education: N/A   Occupational History  . Not on file.   Social History Main Topics  . Smoking status: Former Games developermoker  . Smokeless tobacco: Never Used  . Alcohol Use: No   . Drug Use: No  . Sexual Activity: Not on file   Other Topics Concern  . Not on file   Social History Narrative    No family history on file.   Current outpatient prescriptions:  .  amLODipine-olmesartan (AZOR) 10-40 MG tablet, Take 1 tablet by mouth daily., Disp: 90 tablet, Rfl: 1 .  aspirin 81 MG tablet, Take 1 tablet (81 mg total) by mouth daily., Disp: 90 tablet, Rfl: 3 .  esomeprazole (NEXIUM) 40 MG capsule, Take 1 capsule (40 mg total) by mouth daily at 12 noon., Disp: 90 capsule, Rfl: 1 .  loratadine (CLARITIN) 10 MG tablet, Take 1 tablet (10 mg total) by mouth daily., Disp: 90 tablet, Rfl: 3 .  metFORMIN (GLUCOPHAGE) 500 MG tablet, Take 1 tablet (500 mg total) by mouth 2 (two) times daily with a meal., Disp: 180 tablet, Rfl: 1 .  metoprolol tartrate (LOPRESSOR) 25 MG tablet, Take 1 tablet (25 mg total) by mouth 2 (two) times daily., Disp: 180 tablet, Rfl: 1 .  rosuvastatin (CRESTOR) 20 MG tablet, Take 1 tablet (20 mg total) by mouth daily., Disp: 90 tablet, Rfl: 1 .  albuterol (PROVENTIL HFA;VENTOLIN HFA) 108 (90 Base) MCG/ACT inhaler, Inhale 2 puffs into the lungs every 6 (six) hours as needed for wheezing or shortness of breath., Disp: 1 Inhaler, Rfl: 0 .  gabapentin (NEURONTIN) 100 MG capsule, Take 1 capsule (100 mg total) by  mouth at bedtime. Reported on 02/03/2016, Disp: 90 capsule, Rfl: 1  No Known Allergies   ROS as in subjective   Objective: BP 126/84 mmHg  Pulse 64  Wt 197 lb (89.359 kg)  General appearance: alert, no distress, WD/WN, AA male Oral cavity: MMM, no lesions Neck: supple, no lymphadenopathy, no thyromegaly, no masses, no bruits, no JVD Heart: 3/6 holosystolic blowing murmur, RRR, normal S1, S2, no murmurs Lungs: coarse sounds throughout, no wheezes, rhonchi, or rales 1+ pedal pulses, faint Psych: pleasant, good eye contact, answers questions appropriately Ext: no edema   Assessment: Encounter Diagnoses  Name Primary?  . Diabetic  mononeuropathy associated with type 2 diabetes mellitus (HCC)   . Type 2 diabetes mellitus with complication, without long-term current use of insulin (HCC)   . Essential hypertension Yes  . Hyperlipidemia   . Gastroesophageal reflux disease without esophagitis   . Coronary artery disease involving native coronary artery of native heart with angina pectoris (HCC)   . Atherosclerosis of native artery of both lower extremities with intermittent claudication (HCC)   . Aortic stenosis   . Wheezing   . Abnormal chest CT   . Daytime somnolence   . Vaccine counseling     Plan: C/t same medications, f/u with Dr. Jacinto HalimGanji soon  Labs today  PFT reviewed although effort was suboptimals, and CMA noted he couldn't blow out all the candles on the demo, suggesting decreased capacity or inability to perform test fully.   Of note prior 2008 abnormal Chest CT.  Will send for chest xray.  Can use inhaler prn.    Advised he check insurance coverage for pneumococcal and shingles vaccine  Consider sleep study  Jonathan Brown was seen today for diabetes.  Diagnoses and all orders for this visit:  Essential hypertension -     Comprehensive metabolic panel -     Hemoglobin A1c -     amLODipine-olmesartan (AZOR) 10-40 MG tablet; Take 1 tablet by mouth daily. -     metoprolol tartrate (LOPRESSOR) 25 MG tablet; Take 1 tablet (25 mg total) by mouth 2 (two) times daily.  Diabetic mononeuropathy associated with type 2 diabetes mellitus (HCC) -     Comprehensive metabolic panel -     Hemoglobin A1c -     gabapentin (NEURONTIN) 100 MG capsule; Take 1 capsule (100 mg total) by mouth at bedtime. Reported on 02/03/2016  Type 2 diabetes mellitus with complication, without long-term current use of insulin (HCC) -     Comprehensive metabolic panel -     Hemoglobin A1c -     metFORMIN (GLUCOPHAGE) 500 MG tablet; Take 1 tablet (500 mg total) by mouth 2 (two) times daily with a meal.  Hyperlipidemia -     Comprehensive  metabolic panel -     Hemoglobin A1c -     rosuvastatin (CRESTOR) 20 MG tablet; Take 1 tablet (20 mg total) by mouth daily.  Gastroesophageal reflux disease without esophagitis -     esomeprazole (NEXIUM) 40 MG capsule; Take 1 capsule (40 mg total) by mouth daily at 12 noon.  Coronary artery disease involving native coronary artery of native heart with angina pectoris (HCC)  Atherosclerosis of native artery of both lower extremities with intermittent claudication (HCC) -     DG Chest 2 View; Future  Aortic stenosis -     DG Chest 2 View; Future  Wheezing -     Spirometry with Graph -     DG Chest 2 View; Future  Abnormal chest CT -     Spirometry with Graph -     DG Chest 2 View; Future  Daytime somnolence  Vaccine counseling  Other orders -     loratadine (CLARITIN) 10 MG tablet; Take 1 tablet (10 mg total) by mouth daily. -     aspirin 81 MG tablet; Take 1 tablet (81 mg total) by mouth daily. -     albuterol (PROVENTIL HFA;VENTOLIN HFA) 108 (90 Base) MCG/ACT inhaler; Inhale 2 puffs into the lungs every 6 (six) hours as needed for wheezing or shortness of breath.

## 2016-06-23 NOTE — Patient Instructions (Signed)
Encounter Diagnoses  Name Primary?  . 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 Type 2 diabetes mellitus with complication, without long-term current use of insulin (HCC)   . Essential hypertension Yes  . Hyperlipidemia   . Gastroesophageal reflux disease without esophagitis   . Coronary artery disease involving native coronary artery of native heart with angina pectoris (HCC)   . Atherosclerosis of native artery of both lower extremities with intermittent claudication (HCC)   . Aortic stenosis   . Wheezing   . Abnormal chest CT   . Daytime somnolence   . Vaccine counseling    Recommendations:  Go for chest xray  Begin albuterol inhaler, 2 puffs every 6 hours as needed for wheezing, congestion  We will call with labs and xray results  Follow up with Dr. Gerhard MunchGanji/cardiology  We may end up ordering a sleep study   Check insurance coverage for pneumococcal and shingles vaccines

## 2016-06-24 ENCOUNTER — Other Ambulatory Visit: Payer: Self-pay | Admitting: Medical

## 2016-06-24 LAB — HEMOGLOBIN A1C
HEMOGLOBIN A1C: 5.6 % (ref <5.7)
MEAN PLASMA GLUCOSE: 114 mg/dL

## 2016-11-01 ENCOUNTER — Telehealth: Payer: Self-pay | Admitting: Medical

## 2016-11-01 NOTE — Telephone Encounter (Signed)
Pt states that he needs a refill on his amlodipine pt uses [facility]- [place], [place] - [address] pt can be reached at [phone]

## 2016-11-02 ENCOUNTER — Other Ambulatory Visit: Payer: Self-pay

## 2016-11-02 DIAGNOSIS — I10 Essential (primary) hypertension: Secondary | ICD-10-CM

## 2016-11-02 MED ORDER — AMLODIPINE-OLMESARTAN 10-40 MG PO TABS: 1.0000 | ORAL_TABLET | Freq: Every day | ORAL | 1 refills | Status: DC

## 2016-11-02 NOTE — Telephone Encounter (Signed)
[facility] and called patient to let him know

## 2016-11-04 ENCOUNTER — Encounter: Payer: Self-pay | Admitting: Medical

## 2016-11-04 ENCOUNTER — Ambulatory Visit (INDEPENDENT_AMBULATORY_CARE_PROVIDER_SITE_OTHER): Payer: Medicare HMO | Admitting: Medical

## 2016-11-04 ENCOUNTER — Telehealth: Payer: Self-pay | Admitting: Medical

## 2016-11-04 VITALS — BP 132/70 | HR 77 | Wt 198.4 lb

## 2016-11-04 DIAGNOSIS — I25119 Atherosclerotic heart disease of native coronary artery with unspecified angina pectoris: Secondary | ICD-10-CM

## 2016-11-04 DIAGNOSIS — I70213 Atherosclerosis of native arteries of extremities with intermittent claudication, bilateral legs: Secondary | ICD-10-CM | POA: Diagnosis not present

## 2016-11-04 DIAGNOSIS — I10 Essential (primary) hypertension: Secondary | ICD-10-CM | POA: Diagnosis not present

## 2016-11-04 DIAGNOSIS — E1141 Type 2 diabetes mellitus with diabetic mononeuropathy: Secondary | ICD-10-CM

## 2016-11-04 DIAGNOSIS — R938 Abnormal findings on diagnostic imaging of other specified body structures: Secondary | ICD-10-CM | POA: Diagnosis not present

## 2016-11-04 DIAGNOSIS — Z125 Encounter for screening for malignant neoplasm of prostate: Secondary | ICD-10-CM

## 2016-11-04 DIAGNOSIS — R358 Other polyuria: Secondary | ICD-10-CM

## 2016-11-04 DIAGNOSIS — Z91199 Patient's noncompliance with other medical treatment and regimen due to unspecified reason: Secondary | ICD-10-CM

## 2016-11-04 DIAGNOSIS — E118 Type 2 diabetes mellitus with unspecified complications: Secondary | ICD-10-CM

## 2016-11-04 DIAGNOSIS — I350 Nonrheumatic aortic (valve) stenosis: Secondary | ICD-10-CM

## 2016-11-04 DIAGNOSIS — E785 Hyperlipidemia, unspecified: Secondary | ICD-10-CM

## 2016-11-04 DIAGNOSIS — R351 Nocturia: Secondary | ICD-10-CM | POA: Diagnosis not present

## 2016-11-04 DIAGNOSIS — K219 Gastro-esophageal reflux disease without esophagitis: Secondary | ICD-10-CM | POA: Diagnosis not present

## 2016-11-04 DIAGNOSIS — R3589 Other polyuria: Secondary | ICD-10-CM

## 2016-11-04 DIAGNOSIS — Z0000 Encounter for general adult medical examination without abnormal findings: Secondary | ICD-10-CM | POA: Insufficient documentation

## 2016-11-04 DIAGNOSIS — Z23 Encounter for immunization: Secondary | ICD-10-CM | POA: Diagnosis not present

## 2016-11-04 DIAGNOSIS — E639 Nutritional deficiency, unspecified: Secondary | ICD-10-CM

## 2016-11-04 DIAGNOSIS — Z9119 Patient's noncompliance with other medical treatment and regimen: Secondary | ICD-10-CM

## 2016-11-04 DIAGNOSIS — R9389 Abnormal findings on diagnostic imaging of other specified body structures: Secondary | ICD-10-CM

## 2016-11-04 LAB — HEMOGLOBIN A1C
Hgb A1c MFr Bld: 5.5 % (ref <5.7)
MEAN PLASMA GLUCOSE: 111 mg/dL

## 2016-11-04 LAB — PSA: PSA: 1.6 ng/mL (ref <=4.0)

## 2016-11-04 LAB — ALT: ALT: 37 U/L (ref 9–46)

## 2016-11-04 NOTE — Addendum Note (Signed)
Addended by: Debbrah AlarSMITH, Jerrie Gullo F on: 11/04/2016 11:09 AM   Modules accepted: Orders

## 2016-11-04 NOTE — Progress Notes (Addendum)
Subjective: Chief Complaint  Patient presents with  . dm check    dm check , flu shot    Here for 58mo f/u. I saw him as a new patient in February.   From last visit he forgot to call insurance about shingles and pneumonia vaccines.  Last tetanus vaccine about 4 years ago.  Last colonoscopy about 7 years ago.   Not sure about repeat.  [facility]    Taking Azor 10/40mg  daily and Metoprolol 25mg  twice daily for BP  Taking Nexium 40mg  daily for reflux  Diabetes - taking Metformin 500gm BID, checking glucose most days, seeing mostly <130.   Exercise - walking, mowing.  Checking feet daily for wound.   No foot concerns.  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  Married.   Having some low back pain, using topical heat patch.  No injury or fall.   No other aggravating or relieving factors. No other complaint.  Past Medical History:  Diagnosis Date  . Diabetes mellitus without complication (HCC) 2014  . GERD (gastroesophageal reflux disease)   . Hyperlipidemia   . Hypertension 2012    Past Surgical History:  Procedure Laterality Date  . LEFT AND RIGHT HEART CATHETERIZATION WITH CORONARY ANGIOGRAM N/A 04/02/2014   Procedure: LEFT AND RIGHT HEART CATHETERIZATION WITH CORONARY ANGIOGRAM;  Surgeon: Pamella PertJagadeesh R Ganji, MD;  [facility];  Service: Cardiovascular;  Laterality: N/A;  . TEE WITHOUT CARDIOVERSION N/A 04/30/2014   Procedure: TRANSESOPHAGEAL ECHOCARDIOGRAM (TEE);  Surgeon: Pamella PertJagadeesh R Ganji, MD;  [facility];  Service: Cardiovascular;  Laterality: N/A;    Social History   Social History  . Marital status: Married    Spouse name: N/A  . Number of children: N/A  . Years of education: N/A   Occupational History  . Not on file.   Social History Main Topics  . Smoking status: Former Games developermoker  . Smokeless  tobacco: Never Used  . Alcohol use No  . Drug use: No  . Sexual activity: Not on file   Other Topics Concern  . Not on file   Social History Narrative  . No narrative on file    History reviewed. No pertinent family history.   Current Outpatient Prescriptions:  .  amLODipine-olmesartan (AZOR) 10-40 MG tablet, Take 1 tablet by mouth daily., Disp: 90 tablet, Rfl: 1 .  aspirin 81 MG tablet, Take 1 tablet (81 mg total) by mouth daily., Disp: 90 tablet, Rfl: 3 .  esomeprazole (NEXIUM) 40 MG capsule, Take 1 capsule (40 mg total) by mouth daily at 12 noon., Disp: 90 capsule, Rfl: 1 .  metFORMIN (GLUCOPHAGE) 500 MG tablet, Take 1 tablet (500 mg total) by mouth 2 (two) times daily with a meal., Disp: 180 tablet, Rfl: 1 .  metoprolol tartrate (LOPRESSOR) 25 MG tablet, Take 1 tablet (25 mg total) by mouth 2 (two) times daily., Disp: 180 tablet, Rfl: 1 .  rosuvastatin (CRESTOR) 20 MG tablet, Take 1 tablet (20 mg total) by mouth daily., Disp: 90 tablet, Rfl: 1 .  albuterol (PROVENTIL HFA;VENTOLIN HFA) 108 (90 Base) MCG/ACT inhaler, Inhale 2 puffs into the lungs every 6 (six) hours as needed for wheezing or shortness of breath., Disp: 1 Inhaler, Rfl: 0  No Known Allergies   ROS as in subjective   Objective: BP 132/70   Pulse 77  Wt 198 lb 6.4 oz (90 kg)   SpO2 98%   BMI 31.31 kg/m   General appearance: alert, no distress, WD/WN, AA male Oral cavity: MMM, no lesions Neck: supple, no lymphadenopathy, no thyromegaly, no masses, no bruits, no JVD Heart: 3/6 holosystolic blowing murmur, RRR, normal S1, S2, no murmurs Lungs: coarse sounds throughout, no wheezes, rhonchi, or rales 1+ pedal pulses, faint Psych: pleasant, good eye contact, answers questions appropriately Back: mild mild line lumbar tenderness, ROM 80% of normal with mild pain noted Neuro: normal heel and toe walk, -SLR Ext: no edema Abdomen: +bs, soft, nontneder, no mass, no organomegaly Declines DRE   Diabetic Foot  Exam - Simple   Simple Foot Form Diabetic Foot exam was performed with the following findings:  Yes 11/04/2016 10:50 AM  Visual Inspection See comments:  Yes Sensation Testing See comments:  Yes Pulse Check See comments:  Yes Comments 1+ pedal pulses, 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  . Essential hypertension Yes  . Coronary artery disease involving native coronary artery of native heart with angina pectoris (HCC)   . Atherosclerosis of native artery of both lower extremities with intermittent claudication (HCC)   . Aortic valve stenosis, etiology of cardiac valve disease unspecified   . Gastroesophageal reflux disease without esophagitis   . 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 Type 2 diabetes mellitus with complication, without long-term current use of insulin (HCC)   . Hyperlipidemia, unspecified hyperlipidemia type   . Polyuria   . Nocturia   . Screening for prostate cancer   . Abnormal chest CT   . Poor diet   . Noncompliance      Plan: Discussed need to work on better compliance with diet, exercise  C/t glucose monitoring.  C/t same medications  Labs today  Advised again he check insurance coverage for pneumococcal and shingles vaccine  Noncompliance with diet and some recommendations  Counseled on the influenza virus vaccine.  Vaccine information sheet given.  Influenza vaccine given after consent obtained.   Patient Instructions  Recommendations:  Please call and check to see what your out of pocket expense would be for the shingles and pneumonia vaccines.   You are due for both  We will call with lab results  Continue   I'd like to refer you for Cologuard colon cancer screen.  This just requires a stool sample instead of endoscopy.  High blood pressure  Continue  Azor 10/40mg  daily and  Metoprolol 25mg  twice daily  Acid reflux  Continue Nexium 40mg  daily   Cholesterol  Continue Crestor 20mg  and Aspirin 81mg  daily at bedtime  Diabetes  Continue Metformin 500gm twice daily  Check your sugars daily and write these down so we can review them or bring your glucometer next visit so we can check average readings  Check your feet daily for wounds/sores, and recheck if you see a nonhealing wound  See your eye doctor yearly for diabetic eye exam  See your dentist twice yearly  You need to do a better job with your diet!!!  Eat 3-4 fruit servings daily  Eat vegetables throughout the day  Eat grains 3 times daily such as 1 cup of whole grain pasta or brown rice or oatmeal or 2 slices of whole grain bread  Don't eat meat and dairy every day  Drink mostly water  As a diabetic, you should not be drinking sweet tea, soda, eating junk food such as chips or candy   I would like you to see a dietician  Back pain  Stretch daily  Continue to walk for exercise  You can use tylenol for pain  Abnormal chest findings  You had an abnormal chest CT a few years ago  I'd recommend we repeat this now to make sure there is nothing we need to address.    Please call your insurance company to find out your out of pocket expense to do this  You have valve disease of the heart and are due back with Dr. Jacinto HalimGanji now if your last appointment was 01/2016!    Drevion was seen today for dm check.  Diagnoses and all orders for this visit:  Essential hypertension -     ALT  Coronary artery disease involving native coronary artery of native heart with angina pectoris (HCC)  Atherosclerosis of native artery of both lower extremities with intermittent claudication (HCC)  Aortic valve stenosis, etiology of cardiac valve disease unspecified  Gastroesophageal reflux disease without esophagitis  Diabetic mononeuropathy associated with type 2 diabetes mellitus (HCC) -      Hemoglobin A1c -     HM DIABETES FOOT EXAM -     HM DIABETES EYE EXAM -     ALT  Type 2 diabetes mellitus with complication, without long-term current use of insulin (HCC) -     HM DIABETES EYE EXAM  Hyperlipidemia, unspecified hyperlipidemia type  Polyuria -     PSA  Nocturia -     PSA  Screening for prostate cancer -     PSA  Abnormal chest CT  Poor diet  Noncompliance  Spent > 45 minutes face to face with patient in discussion of symptoms, evaluation, plan and recommendations.

## 2016-11-04 NOTE — Patient Instructions (Signed)
Recommendations:  Please call and check to see what your out of pocket expense would be for the shingles and pneumonia vaccines.   You are due for both  We will call with lab results  Continue   I'd like to refer you for Cologuard colon cancer screen.  This just requires a stool sample instead of endoscopy.  High blood pressure  Continue  Azor 10/40mg  daily and Metoprolol 25mg  twice daily  Acid reflux  Continue Nexium 40mg  daily   Cholesterol  Continue Crestor 20mg  and Aspirin 81mg  daily at bedtime  Diabetes  Continue Metformin 500gm twice daily  Check your sugars daily and write these down so we can review them or bring your glucometer next visit so we can check average readings  Check your feet daily for wounds/sores, and recheck if you see a nonhealing wound  See your eye doctor yearly for diabetic eye exam  See your dentist twice yearly  You need to do a better job with your diet!!!  Eat 3-4 fruit servings daily  Eat vegetables throughout the day  Eat grains 3 times daily such as 1 cup of whole grain pasta or brown rice or oatmeal or 2 slices of whole grain bread  Don't eat meat and dairy every day  Drink mostly water  As a diabetic, you should not be drinking sweet tea, soda, eating junk food such as chips or candy   I would like you to see a dietician  Back pain  Stretch daily  Continue to walk for exercise  You can use tylenol for pain  Abnormal chest findings  You had an abnormal chest CT a few years ago  I'd recommend we repeat this now to make sure there is nothing we need to address.    Please call your insurance company to find out your out of pocket expense to do this  You have valve disease of the heart and are due back with Dr. Jacinto HalimGanji now if your last appointment was 01/2016!

## 2016-11-04 NOTE — Telephone Encounter (Signed)
Faxed order cologuard

## 2016-11-04 NOTE — Telephone Encounter (Signed)
Refer for cologuard testing 

## 2016-11-05 ENCOUNTER — Other Ambulatory Visit: Payer: Self-pay | Admitting: Medical

## 2016-11-08 ENCOUNTER — Telehealth: Payer: Self-pay | Admitting: Medical

## 2016-11-08 ENCOUNTER — Encounter: Payer: Self-pay | Admitting: Medical

## 2016-11-08 ENCOUNTER — Other Ambulatory Visit: Payer: Self-pay | Admitting: Medical

## 2016-11-08 MED ORDER — AMLODIPINE BESYLATE 10 MG PO TABS: 10.0000 mg | ORAL_TABLET | Freq: Every day | ORAL | 0 refills | Status: DC

## 2016-11-08 MED ORDER — LOSARTAN POTASSIUM 100 MG PO TABS: 100.0000 mg | ORAL_TABLET | Freq: Every day | ORAL | 0 refills | Status: DC

## 2016-11-08 NOTE — Telephone Encounter (Signed)
Wife states can't afford generic Azor, was only $4 til insurance changed.  I called [facility] & it's now $52.49 for 30 days.  I checked to see and neither amlodipine or olmesartan are on the $4 list. Cost amlodipine $23 & olmesartan $231.  I printed $4 list to see if there is anything you can change pt to that he can afford, list in your folder.  Please advise

## 2016-11-08 NOTE — Telephone Encounter (Signed)
I would stay on Amlodipine, so I sent separate Amlodipine and a similar medication to Olmesartan called Losartan.   Thus, once he runs out of Azor, he will then take separate Amlodipine and separate Losartan which is similar to Azor.  F/u 39mo on BP

## 2016-11-08 NOTE — Telephone Encounter (Signed)
Wife informed of medication change, called pharmacy and both new meds are $4 each.

## 2016-11-10 ENCOUNTER — Encounter: Payer: Self-pay | Admitting: Medical

## 2016-11-10 ENCOUNTER — Other Ambulatory Visit: Payer: Self-pay | Admitting: Medical

## 2016-11-10 ENCOUNTER — Telehealth: Payer: Self-pay | Admitting: Medical

## 2016-11-10 MED ORDER — LOVASTATIN 40 MG PO TABS: 40.0000 mg | ORAL_TABLET | Freq: Every day | ORAL | 1 refills | Status: DC

## 2016-11-10 NOTE — Telephone Encounter (Signed)
Pt informed and verbalized understanding

## 2016-11-10 NOTE — Telephone Encounter (Signed)
I changed to Lovastain 40mg  which would be similar to the dose of Crestor.  If this is not $4, let me know

## 2016-11-10 NOTE — Telephone Encounter (Signed)
[facility] [place]  [facility] & [facility] to [facility].  Problem is Rosuvastatin is $141 for 30 days and pt can't afford that.  I checked with [facility] and Lovastatin 20mg  is on the $4 list, can you switch him to that?

## 2016-11-11 NOTE — Telephone Encounter (Signed)
Called pharmacy & Lovastatin is also $4, pt picked up

## 2016-12-27 DIAGNOSIS — Z950 Presence of cardiac pacemaker: Secondary | ICD-10-CM

## 2016-12-27 HISTORY — DX: Presence of cardiac pacemaker: Z95.0

## 2017-01-03 DIAGNOSIS — Z79899 Other long term (current) drug therapy: Secondary | ICD-10-CM | POA: Diagnosis not present

## 2017-01-03 DIAGNOSIS — M545 Low back pain: Secondary | ICD-10-CM | POA: Diagnosis not present

## 2017-01-03 DIAGNOSIS — Z98811 Dental restoration status: Secondary | ICD-10-CM | POA: Diagnosis not present

## 2017-01-03 DIAGNOSIS — E785 Hyperlipidemia, unspecified: Secondary | ICD-10-CM | POA: Diagnosis not present

## 2017-01-03 DIAGNOSIS — E119 Type 2 diabetes mellitus without complications: Secondary | ICD-10-CM | POA: Diagnosis not present

## 2017-01-03 DIAGNOSIS — R011 Cardiac murmur, unspecified: Secondary | ICD-10-CM | POA: Diagnosis not present

## 2017-01-03 DIAGNOSIS — K029 Dental caries, unspecified: Secondary | ICD-10-CM | POA: Diagnosis not present

## 2017-01-03 DIAGNOSIS — Z0000 Encounter for general adult medical examination without abnormal findings: Secondary | ICD-10-CM | POA: Diagnosis not present

## 2017-01-03 DIAGNOSIS — K219 Gastro-esophageal reflux disease without esophagitis: Secondary | ICD-10-CM | POA: Diagnosis not present

## 2017-01-03 DIAGNOSIS — Z87891 Personal history of nicotine dependence: Secondary | ICD-10-CM | POA: Diagnosis not present

## 2017-01-03 DIAGNOSIS — Z7984 Long term (current) use of oral hypoglycemic drugs: Secondary | ICD-10-CM | POA: Diagnosis not present

## 2017-01-03 DIAGNOSIS — G3184 Mild cognitive impairment, so stated: Secondary | ICD-10-CM | POA: Diagnosis not present

## 2017-01-03 DIAGNOSIS — R600 Localized edema: Secondary | ICD-10-CM | POA: Diagnosis not present

## 2017-01-03 DIAGNOSIS — I10 Essential (primary) hypertension: Secondary | ICD-10-CM | POA: Diagnosis not present

## 2017-02-11 ENCOUNTER — Other Ambulatory Visit: Payer: Self-pay | Admitting: Medical

## 2017-03-08 ENCOUNTER — Other Ambulatory Visit: Payer: Self-pay | Admitting: Medical

## 2017-03-08 DIAGNOSIS — I10 Essential (primary) hypertension: Secondary | ICD-10-CM

## 2017-03-08 DIAGNOSIS — E118 Type 2 diabetes mellitus with unspecified complications: Secondary | ICD-10-CM

## 2017-03-14 ENCOUNTER — Encounter: Payer: Self-pay | Admitting: Medical

## 2017-03-14 ENCOUNTER — Ambulatory Visit (INDEPENDENT_AMBULATORY_CARE_PROVIDER_SITE_OTHER): Payer: Medicare HMO | Admitting: Medical

## 2017-03-14 VITALS — BP 118/86 | HR 71 | Wt 200.8 lb

## 2017-03-14 DIAGNOSIS — E118 Type 2 diabetes mellitus with unspecified complications: Secondary | ICD-10-CM | POA: Diagnosis not present

## 2017-03-14 DIAGNOSIS — Z2821 Immunization not carried out because of patient refusal: Secondary | ICD-10-CM | POA: Diagnosis not present

## 2017-03-14 DIAGNOSIS — K219 Gastro-esophageal reflux disease without esophagitis: Secondary | ICD-10-CM | POA: Diagnosis not present

## 2017-03-14 DIAGNOSIS — I70213 Atherosclerosis of native arteries of extremities with intermittent claudication, bilateral legs: Secondary | ICD-10-CM | POA: Diagnosis not present

## 2017-03-14 DIAGNOSIS — I10 Essential (primary) hypertension: Secondary | ICD-10-CM | POA: Diagnosis not present

## 2017-03-14 DIAGNOSIS — I350 Nonrheumatic aortic (valve) stenosis: Secondary | ICD-10-CM | POA: Diagnosis not present

## 2017-03-14 DIAGNOSIS — I25119 Atherosclerotic heart disease of native coronary artery with unspecified angina pectoris: Secondary | ICD-10-CM | POA: Diagnosis not present

## 2017-03-14 DIAGNOSIS — Z7189 Other specified counseling: Secondary | ICD-10-CM | POA: Diagnosis not present

## 2017-03-14 DIAGNOSIS — Z7185 Encounter for immunization safety counseling: Secondary | ICD-10-CM

## 2017-03-14 DIAGNOSIS — E1141 Type 2 diabetes mellitus with diabetic mononeuropathy: Secondary | ICD-10-CM

## 2017-03-14 DIAGNOSIS — E785 Hyperlipidemia, unspecified: Secondary | ICD-10-CM | POA: Diagnosis not present

## 2017-03-14 LAB — COMPREHENSIVE METABOLIC PANEL
ALBUMIN: 4 g/dL (ref 3.6–5.1)
ALT: 25 U/L (ref 9–46)
AST: 15 U/L (ref 10–35)
Alkaline Phosphatase: 90 U/L (ref 40–115)
BUN: 15 mg/dL (ref 7–25)
CALCIUM: 10.1 mg/dL (ref 8.6–10.3)
CHLORIDE: 107 mmol/L (ref 98–110)
CO2: 20 mmol/L (ref 20–31)
Creat: 1.07 mg/dL (ref 0.70–1.25)
Glucose, Bld: 175 mg/dL — ABNORMAL HIGH (ref 65–99)
POTASSIUM: 4.3 mmol/L (ref 3.5–5.3)
Sodium: 136 mmol/L (ref 135–146)
Total Bilirubin: 0.5 mg/dL (ref 0.2–1.2)
Total Protein: 6.9 g/dL (ref 6.1–8.1)

## 2017-03-14 LAB — LIPID PANEL
CHOL/HDL RATIO: 5.8 ratio — AB (ref <5.0)
Cholesterol: 174 mg/dL (ref <200)
HDL: 30 mg/dL — AB (ref >40)
LDL CALC: 94 mg/dL (ref <100)
TRIGLYCERIDES: 251 mg/dL — AB (ref <150)
VLDL: 50 mg/dL — AB (ref <30)

## 2017-03-14 NOTE — Progress Notes (Signed)
Subjective: Chief Complaint  Patient presents with  . dm check    dm check    Here for 70mo f/u.  Hypertension - compliant with Norvasc 10mg  daily, Losartan 100mg  daily, Metoprolol 25mg  BID without side effects.  Not check BPs.  Taking Nexium 40mg  daily for reflux  Diabetes - feeling fine, not checking sugars regularly.  Taking Metformin 500gm BID, checking glucose sometime, seeing mostly <130.   Checking feet daily for wound.   No foot concerns.  We changed last visit from [place] which was too expensive.  He notes ABIs within past year or 2 through cardiology.  Lipids - taking Crestor 20mg  daily and ASA 81mg  daily at bedtime  He is exercising with mowing, walking, some physical labor digging.  Walking track at [employer].  Since last visit eating more whole grains, has cut back on meat, trying to eat health.  Wife has diabetes as well. .    No other aggravating or relieving factors. No other complaint.  Past Medical History:  Diagnosis Date  . Diabetes mellitus without complication (HCC) 2014  . GERD (gastroesophageal reflux disease)   . Hyperlipidemia   . Hypertension 2012    Past Surgical History:  Procedure Laterality Date  . LEFT AND RIGHT HEART CATHETERIZATION WITH CORONARY ANGIOGRAM N/A 04/02/2014   Procedure: LEFT AND RIGHT HEART CATHETERIZATION WITH CORONARY ANGIOGRAM;  Surgeon: Pamella Pert, MD;  Location: [facility] CATH LAB;  Service: Cardiovascular;  Laterality: N/A;  . TEE WITHOUT CARDIOVERSION N/A 04/30/2014   Procedure: TRANSESOPHAGEAL ECHOCARDIOGRAM (TEE);  Surgeon: Pamella Pert, MD;  Location: [facility] ENDOSCOPY;  Service: Cardiovascular;  Laterality: N/A;    Social History   Social History  . Marital status: Married    Spouse name: N/A  . Number of children: N/A  . Years of education: N/A   Occupational History  . Not on file.   Social History Main Topics  . Smoking status: Former Games developer  . Smokeless tobacco: Never Used  . Alcohol use No  . Drug use: No  . Sexual  activity: Not on file   Other Topics Concern  . Not on file   Social History Narrative  . No narrative on file    History reviewed. No pertinent family history.   Current Outpatient Prescriptions:  .  amLODipine (NORVASC) 10 MG tablet, TAKE ONE TABLET BY MOUTH ONCE DAILY, Disp: 90 tablet, Rfl: 0 .  aspirin 81 MG tablet, Take 1 tablet (81 mg total) by mouth daily., Disp: 90 tablet, Rfl: 3 .  esomeprazole (NEXIUM) 40 MG capsule, Take 1 capsule (40 mg total) by mouth daily at 12 noon., Disp: 90 capsule, Rfl: 1 .  losartan (COZAAR) 100 MG tablet, TAKE ONE TABLET BY MOUTH ONCE DAILY, Disp: 90 tablet, Rfl: 0 .  lovastatin (MEVACOR) 40 MG tablet, Take 1 tablet (40 mg total) by mouth at bedtime., Disp: 90 tablet, Rfl: 1 .  metFORMIN (GLUCOPHAGE) 500 MG tablet, TAKE ONE TABLET BY MOUTH TWICE DAILY WITH  MEAL, Disp: 180 tablet, Rfl: 0 .  metoprolol tartrate (LOPRESSOR) 25 MG tablet, TAKE ONE TABLET BY MOUTH TWICE DAILY, Disp: 180 tablet, Rfl: 0  No Known Allergies   ROS as in subjective   Objective: BP 118/86   Pulse 71   Wt 200 lb 12.8 oz (91.1 kg)   SpO2 97%   BMI 31.69 kg/m   BP Readings from Last 3 Encounters:  03/14/17 118/86  11/04/16 132/70  06/23/16 126/84   General appearance: alert, no distress, WD/WN,  AA male Oral cavity: MMM, no lesions Neck: supple, no lymphadenopathy, no thyromegaly, no masses, no bruits, no JVD Heart: 3/6 holosystolic blowing murmur, RRR, normal S1, S2, no murmurs Lungs: clear, no wheezes, rhonchi, or rales 1+ pedal pulses, faint Psych: pleasant, good eye contact, answers questions appropriately Ext: no edema Abdomen: +bs, soft, nontender, no mass, no organomegaly   Diabetic Foot Exam - Simple   Simple Foot Form Diabetic Foot exam was performed with the following findings:  Yes 03/14/2017 11:58 AM  Visual Inspection No deformities, no ulcerations, no other skin breakdown bilaterally:  Yes Sensation Testing Intact to touch and monofilament  testing bilaterally:  Yes Pulse Check See comments:  Yes Comments 1+ pedal pulses      Assessment: Encounter Diagnoses  Name Primary?  . Type 2 diabetes mellitus with complication, without long-term current use of insulin (HCC) Yes  . 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 Essential hypertension   . Atherosclerosis of native artery of both lower extremities with intermittent claudication (HCC)   . Coronary artery disease involving native coronary artery of native heart with angina pectoris (HCC)   . Aortic valve stenosis, etiology of cardiac valve disease unspecified   . Gastroesophageal reflux disease without esophagitis   . Hyperlipidemia, unspecified hyperlipidemia type   . Pneumococcal vaccination declined   . Vaccine counseling      Plan: C/t same medications, BP at goal, labs today.  Advised he check sugars 3 days per week fasting in the morning Glad to hear he is exercising, eating healthier. He declines pneumococcal and shingles vaccine Reviewed Dr. Verl DickerGanji's notes from 01/2016.  He is due back at this time for f/u.   Last LE screening 2015.  Ezio was seen today for dm check.  Diagnoses and all orders for this visit:  Type 2 diabetes mellitus with complication, without long-term current use of insulin (HCC) -     Microalbumin / creatinine urine ratio -     HM DIABETES FOOT EXAM -     HM DIABETES EYE EXAM -     Hemoglobin A1c -     Comprehensive metabolic panel -     Lipid panel  Diabetic mononeuropathy associated with type 2 diabetes mellitus (HCC) -     Microalbumin / creatinine urine ratio -     HM DIABETES FOOT EXAM -     HM DIABETES EYE EXAM  Essential hypertension -     Comprehensive metabolic panel  Atherosclerosis of native artery of both lower extremities with intermittent claudication (HCC) -     Comprehensive metabolic panel -     Lipid panel  Coronary artery disease involving native coronary artery of  native heart with angina pectoris (HCC)  Aortic valve stenosis, etiology of cardiac valve disease unspecified  Gastroesophageal reflux disease without esophagitis  Hyperlipidemia, unspecified hyperlipidemia type -     Lipid panel  Pneumococcal vaccination declined  Vaccine counseling

## 2017-03-15 ENCOUNTER — Other Ambulatory Visit: Payer: Self-pay | Admitting: Medical

## 2017-03-15 DIAGNOSIS — K219 Gastro-esophageal reflux disease without esophagitis: Secondary | ICD-10-CM

## 2017-03-15 DIAGNOSIS — I10 Essential (primary) hypertension: Secondary | ICD-10-CM

## 2017-03-15 DIAGNOSIS — E118 Type 2 diabetes mellitus with unspecified complications: Secondary | ICD-10-CM

## 2017-03-15 LAB — MICROALBUMIN / CREATININE URINE RATIO
CREATININE, URINE: 119 mg/dL (ref 20–370)
Microalb Creat Ratio: 2 mcg/mg creat (ref <30)
Microalb, Ur: 0.2 mg/dL

## 2017-03-15 LAB — HEMOGLOBIN A1C
Hgb A1c MFr Bld: 5.5 % (ref <5.7)
Mean Plasma Glucose: 111 mg/dL

## 2017-03-15 MED ORDER — ESOMEPRAZOLE MAGNESIUM 40 MG PO CPDR: 40.0000 mg | DELAYED_RELEASE_CAPSULE | Freq: Every day | ORAL | 1 refills | Status: DC

## 2017-03-15 MED ORDER — LOVASTATIN 40 MG PO TABS
40.0000 mg | ORAL_TABLET | Freq: Every day | ORAL | 3 refills | Status: DC
Start: 2017-03-15 — End: 2017-06-15

## 2017-03-15 MED ORDER — ASPIRIN 81 MG PO TABS: 81.0000 mg | ORAL_TABLET | Freq: Every day | ORAL | 3 refills | Status: DC

## 2017-03-22 ENCOUNTER — Telehealth: Payer: Self-pay

## 2017-03-22 ENCOUNTER — Other Ambulatory Visit: Payer: Self-pay | Admitting: Medical

## 2017-03-22 DIAGNOSIS — R69 Illness, unspecified: Secondary | ICD-10-CM | POA: Diagnosis not present

## 2017-03-22 MED ORDER — OMEPRAZOLE 40 MG PO CPDR: 40.0000 mg | DELAYED_RELEASE_CAPSULE | Freq: Every day | ORAL | 1 refills | Status: DC

## 2017-03-22 NOTE — Telephone Encounter (Signed)
Omeprazole sent as option to nexium

## 2017-03-22 NOTE — Telephone Encounter (Signed)
Pt stopped by the office about his nexium. He said that his nexium was $ 300 .00  And he can't afford it . Wants to know if he can havre something cheaper.

## 2017-03-24 NOTE — Telephone Encounter (Signed)
Okay 

## 2017-03-24 NOTE — Telephone Encounter (Signed)
Called to let him know about this , and he hung up the phone.

## 2017-05-18 ENCOUNTER — Other Ambulatory Visit: Payer: Self-pay | Admitting: Medical

## 2017-06-14 ENCOUNTER — Ambulatory Visit (INDEPENDENT_AMBULATORY_CARE_PROVIDER_SITE_OTHER): Payer: Medicare HMO | Admitting: Medical

## 2017-06-14 ENCOUNTER — Encounter: Payer: Self-pay | Admitting: Medical

## 2017-06-14 VITALS — BP 132/80 | HR 77 | Ht 67.0 in | Wt 189.6 lb

## 2017-06-14 DIAGNOSIS — I70213 Atherosclerosis of native arteries of extremities with intermittent claudication, bilateral legs: Secondary | ICD-10-CM | POA: Diagnosis not present

## 2017-06-14 DIAGNOSIS — I25119 Atherosclerotic heart disease of native coronary artery with unspecified angina pectoris: Secondary | ICD-10-CM | POA: Diagnosis not present

## 2017-06-14 DIAGNOSIS — I10 Essential (primary) hypertension: Secondary | ICD-10-CM | POA: Diagnosis not present

## 2017-06-14 DIAGNOSIS — Z1211 Encounter for screening for malignant neoplasm of colon: Secondary | ICD-10-CM

## 2017-06-14 DIAGNOSIS — Z550 Illiteracy and low-level literacy: Secondary | ICD-10-CM | POA: Insufficient documentation

## 2017-06-14 DIAGNOSIS — Z114 Encounter for screening for human immunodeficiency virus [HIV]: Secondary | ICD-10-CM

## 2017-06-14 DIAGNOSIS — K219 Gastro-esophageal reflux disease without esophagitis: Secondary | ICD-10-CM | POA: Diagnosis not present

## 2017-06-14 DIAGNOSIS — E1141 Type 2 diabetes mellitus with diabetic mononeuropathy: Secondary | ICD-10-CM

## 2017-06-14 DIAGNOSIS — Z125 Encounter for screening for malignant neoplasm of prostate: Secondary | ICD-10-CM | POA: Insufficient documentation

## 2017-06-14 DIAGNOSIS — Z0000 Encounter for general adult medical examination without abnormal findings: Secondary | ICD-10-CM

## 2017-06-14 DIAGNOSIS — I350 Nonrheumatic aortic (valve) stenosis: Secondary | ICD-10-CM

## 2017-06-14 DIAGNOSIS — Z87891 Personal history of nicotine dependence: Secondary | ICD-10-CM | POA: Insufficient documentation

## 2017-06-14 DIAGNOSIS — R69 Illness, unspecified: Secondary | ICD-10-CM | POA: Diagnosis not present

## 2017-06-14 DIAGNOSIS — J929 Pleural plaque without asbestos: Secondary | ICD-10-CM | POA: Insufficient documentation

## 2017-06-14 DIAGNOSIS — E785 Hyperlipidemia, unspecified: Secondary | ICD-10-CM | POA: Diagnosis not present

## 2017-06-14 DIAGNOSIS — E118 Type 2 diabetes mellitus with unspecified complications: Secondary | ICD-10-CM

## 2017-06-14 DIAGNOSIS — Z7189 Other specified counseling: Secondary | ICD-10-CM

## 2017-06-14 DIAGNOSIS — R9389 Abnormal findings on diagnostic imaging of other specified body structures: Secondary | ICD-10-CM

## 2017-06-14 DIAGNOSIS — R5383 Other fatigue: Secondary | ICD-10-CM | POA: Insufficient documentation

## 2017-06-14 DIAGNOSIS — Z1159 Encounter for screening for other viral diseases: Secondary | ICD-10-CM | POA: Diagnosis not present

## 2017-06-14 DIAGNOSIS — R938 Abnormal findings on diagnostic imaging of other specified body structures: Secondary | ICD-10-CM | POA: Diagnosis not present

## 2017-06-14 DIAGNOSIS — Z7185 Encounter for immunization safety counseling: Secondary | ICD-10-CM

## 2017-06-14 LAB — POCT URINALYSIS DIP (PROADVANTAGE DEVICE)
BILIRUBIN UA: NEGATIVE
Glucose, UA: NEGATIVE mg/dL
Leukocytes, UA: NEGATIVE
Nitrite, UA: NEGATIVE
Protein Ur, POC: NEGATIVE mg/dL
SPECIFIC GRAVITY, URINE: 1.03
Urobilinogen, Ur: NEGATIVE
pH, UA: 5.5 (ref 5.0–8.0)

## 2017-06-14 LAB — LIPID PANEL
CHOL/HDL RATIO: 4.3 ratio (ref <5.0)
Cholesterol: 147 mg/dL (ref <200)
HDL: 34 mg/dL — AB (ref >40)
LDL CALC: 97 mg/dL (ref <100)
Triglycerides: 82 mg/dL (ref <150)
VLDL: 16 mg/dL (ref <30)

## 2017-06-14 LAB — CBC
HCT: 42.7 % (ref 38.5–50.0)
Hemoglobin: 14.4 g/dL (ref 13.2–17.1)
MCH: 30.6 pg (ref 27.0–33.0)
MCHC: 33.7 g/dL (ref 32.0–36.0)
MCV: 90.7 fL (ref 80.0–100.0)
MPV: 11.5 fL (ref 7.5–12.5)
PLATELETS: 169 10*3/uL (ref 140–400)
RBC: 4.71 MIL/uL (ref 4.20–5.80)
RDW: 12.8 % (ref 11.0–15.0)
WBC: 6 10*3/uL (ref 4.0–10.5)

## 2017-06-14 LAB — TSH: TSH: 2.16 m[IU]/L (ref 0.40–4.50)

## 2017-06-14 LAB — HEPATITIS C ANTIBODY: HCV AB: NEGATIVE

## 2017-06-14 NOTE — Addendum Note (Signed)
Addended by: Winn JockVALENTINE, Whitley Strycharz N on: 06/14/2017 01:49 PM   Modules accepted: Orders

## 2017-06-14 NOTE — Addendum Note (Signed)
Addended by: Winn JockVALENTINE, Toy Eisemann N on: 06/14/2017 10:11 AM   Modules accepted: Orders

## 2017-06-14 NOTE — Progress Notes (Signed)
Subjective:    Jonathan Brown is a 66 y.o. male who presents for Preventative Services visit and chronic medical problems/med check visit.    Primary Care Provider Tysinger, Kermit Baloavid S, PA-C here for primary care  Current Health Care Team:  Dentist, eye doctor  Dr. Jacinto HalimGanji, cardiology  Medical Services you may have received from other than [facility] providers in the past year (date may be approximate) none  Exercise Current exercise habits: some with walking    Nutrition/Diet Current diet: in general, a "healthy" diet    Depression Screen [facility] 06/[facility]/2017  Decreased Interest 0  Down, Depressed, Hopeless 0  PHQ - 2 Score 0    Activities of Daily Living Screen/Functional Status Survey Is the patient deaf or have difficulty hearing?: No Does the patient have difficulty seeing, even when wearing glasses/contacts?: Yes (sometimes) Does the patient have difficulty concentrating, remembering, or making decisions?: No Does the patient have difficulty walking or climbing stairs?: No Does the patient have difficulty dressing or bathing?: No Does the patient have difficulty doing errands alone such as visiting a doctor's office or shopping?: No   Fall Risk Screen Fall Risk  06/14/2017  Falls in the past year? Yes  Risk for fall due to : Other (Comment)    Gait Assessment: Normal gait observed yes  Advanced directives Does patient have a Health Care Power of Attorney? No Does patient have a Living Will? No  Past Medical History:  Diagnosis Date  . Diabetes mellitus without complication (HCC) 2014  . GERD (gastroesophageal reflux disease)   . Hyperlipidemia   . Hypertension 2012    Past Surgical History:  Procedure Laterality Date  . LEFT AND RIGHT HEART CATHETERIZATION WITH CORONARY ANGIOGRAM N/A 04/02/2014   Procedure: LEFT AND RIGHT HEART CATHETERIZATION WITH CORONARY ANGIOGRAM;  Surgeon: Pamella PertJagadeesh R Ganji, MD;  [facility] LAB;  Service:  Cardiovascular;  Laterality: N/A;  . TEE WITHOUT CARDIOVERSION N/A 04/30/2014   Procedure: TRANSESOPHAGEAL ECHOCARDIOGRAM (TEE);  Surgeon: Pamella PertJagadeesh R Ganji, MD;  [facility];  Service: Cardiovascular;  Laterality: N/A;    Social History   Social History  . Marital status: Married    Spouse name: N/A  . Number of children: N/A  . Years of education: N/A   Occupational History  . Not on file.   Social History Main Topics  . Smoking status: Former Games developermoker  . Smokeless tobacco: Never Used  . Alcohol use No  . Drug use: No  . Sexual activity: Not on file   Other Topics Concern  . Not on file   Social History Narrative  . No narrative on file    Family History  Problem Relation Age of Onset  . Other Mother        died of old age  . Heart disease Father   . Sleep apnea Sister   . Diabetes Brother        diabetic coma x 3 years  . Alcohol abuse Brother   . Heart disease Brother 48       MI  . Cancer Neg Hx   . Stroke Neg Hx      Current Outpatient Prescriptions:  .  amLODipine (NORVASC) 10 MG tablet, TAKE 1 TABLET BY MOUTH ONCE DAILY, Disp: 90 tablet, Rfl: 0 .  aspirin 81 MG tablet, Take 1 tablet (81 mg total) by mouth daily., Disp: 90 tablet, Rfl: 3 .  losartan (COZAAR) 100 MG tablet, TAKE 1 TABLET BY MOUTH ONCE  DAILY, Disp: 90 tablet, Rfl: 0 .  lovastatin (MEVACOR) 40 MG tablet, Take 1 tablet (40 mg total) by mouth at bedtime., Disp: 90 tablet, Rfl: 3 .  metFORMIN (GLUCOPHAGE) 500 MG tablet, TAKE ONE TABLET BY MOUTH TWICE DAILY WITH  MEAL, Disp: 180 tablet, Rfl: 0 .  metoprolol tartrate (LOPRESSOR) 25 MG tablet, TAKE ONE TABLET BY MOUTH TWICE DAILY, Disp: 180 tablet, Rfl: 0 .  omeprazole (PRILOSEC) 40 MG capsule, Take 1 capsule (40 mg total) by mouth daily., Disp: 90 capsule, Rfl: 1  No Known Allergies  History reviewed: allergies, current medications, past family history, past medical history, past social history, past surgical history and problem  list  Chronic issues discussed: Diabetes - compliant with medication without c/o.  Hyperlipidemia - compliant with medication without c/o.  CAD/HTN - compliant with medication without c/o.  hasn't been back to see cardiology yet   Acute issues discussed: Fatigue, not sure about snoring, witnessed apnea.  Sister has sleep apnea  Objective:   Biometrics BP 132/80   Pulse 77   Ht 5\' 7"  (1.702 m)   Wt 189 lb 9.6 oz (86 kg)   SpO2 97%   BMI 29.70 kg/m   Cognitive Testing  Alert? Yes  Normal Appearance?Yes  Oriented to person? Yes  Place? Yes   Time? Yes  Recall of three objects?  Yes  Can perform simple calculations? No  Displays appropriate judgment?Yes  Can read the correct time from a watch face?No  General appearance: alert, no distress, WD/WN, AA male  Nutritional Status: Inadequate calore intake? no Loss of muscle mass? no Loss of fat beneath skin? no Localized or general edema? no Diminished functional status? no  Other pertinent exam: HEENT: normocephalic, sclerae anicteric, TMs pearly, nares patent, no discharge or erythema, pharynx normal Skin:  Oral cavity: MMM, no lesions Neck: supple, no lymphadenopathy, no thyromegaly, no masses, no bruits Heart: 2/6 holosystolic murmur heard best in upper sternal border,s otherwise RRR, normal S1, S2 Lungs: decreased breath sounds, no wheezes, rhonchi, or rales Abdomen: +bs, soft, non tender, non distended, no masses, no hepatomegaly, no splenomegaly Musculoskeletal: nontender, no swelling, no obvious deformity Extremities: no edema, no cyanosis, no clubbing Pulses: 1+ symmetric, upper and lower extremities, normal cap refill Neurological: alert, oriented x 3, CN2-12 intact, strength normal upper extremities and lower extremities, sensation normal throughout, DTRs 2+ throughout, no cerebellar signs, gait normal Psychiatric: normal affect, behavior normal, pleasant   GU: normal male external genitalia, nontender, no  masses, no hernia, no lymphadenopathy Rectal: anus normal tone, prostate mildly enlarged, no nodules, occult negative stool    Assessment:   Encounter Diagnoses  Name Primary?  . Medicare annual wellness visit, initial Yes  . Aortic valve stenosis, etiology of cardiac valve disease unspecified   . Atherosclerosis of native artery of both lower extremities with intermittent claudication (HCC)   . Coronary artery disease involving native coronary artery of native heart with angina pectoris (HCC)   . Essential hypertension   . Gastroesophageal reflux disease without esophagitis   . 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 Type 2 diabetes mellitus with complication, without long-term current use of insulin (HCC)   . Abnormal chest CT   . Hyperlipidemia, unspecified hyperlipidemia type   . Vaccine counseling   . Screening for prostate cancer   . 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 Need for hepatitis C screening test   . Screening for HIV (human immunodeficiency virus)   .  Other fatigue   . Pleural plaque   . Screen for colon cancer      Plan:   A preventative services visit was completed today.  During the course of the visit today, we discussed and counseled about appropriate screening and preventive services.  A health risk assessment was established today that included a review of current medications, allergies, social history, family history, medical and preventative health history, biometrics, and preventative screenings to identify potential safety concerns or impairments.  A personalized plan was printed today for your records and use.   Personalized health advice and education was given today to reduce health risks and promote self management and wellness.  Information regarding end of life planning was discussed today.  Conditions/risks identified: Falls - discussed fall prevention Can't read or write - recommend he consider classes to help learn to  read  Chronic problems discussed today: Diabetes: C/t same medications for diabetes, daily foot checks, glucose monitoring.   Not sure he is really monitoring given inability to read, but he says he is monitoring sugars  HTN - C/t same medication   CAD, hyperlipidemia - c/t same medication labs  GERD - controlled on current medication   Acute problems discussed today: Fatigue - labs today, referral back to cardiology, consider sleep study  Recommendations:  I recommend a yearly ophthalmology/optometry visit for glaucoma screening and eye checkup  I recommended a yearly dental visit for hygiene and checkup  Advanced directives - discussed nature and purpose of Advanced Directives, encouraged them to complete them if they have not done so and/or encouraged them to get [place] a copy if they have done this already.  Due for repeat Chest CT given fatigue, hx/o abnormal CT, pleural plaque   Referrals today: Referred to GI for updated colonoscopy Referred back to cardiology for general eval of fatigue, hx/o aortic stenosis, updated ABI and possibly AAA screen No major risks for osteoporosis at this time   Immunizations: I recommended a yearly influenza vaccine, typically in September when the vaccine is usually available Is the Pneumococcal vaccine up to date: no. Is the Shingles vaccine up to date: no.   Is the Td/Tdap vaccine up to date: no. He will check insurance coverage on vaccines   Medicare Attestation A preventative services visit was completed today.  During the course of the visit the patient was educated and counseled about appropriate screening and preventive services.  A health risk assessment was established with the patient that included a review of current medications, allergies, social history, family history, medical and preventative health history, biometrics, and preventative screenings to identify potential safety concerns or impairments.  A personalized plan was  printed today for the patient's records and use.   Personalized health advice and education was given today to reduce health risks and promote self management and wellness.  Information regarding end of life planning was discussed today.  Ernst Breach, PA-C   06/14/2017

## 2017-06-14 NOTE — Patient Instructions (Signed)
MEDICARE PREVENTATIVE SERVICES AND PERSONALIZED PLAN for Bilbo Carcamo June 14, 2017  Thank you for trusting [place] with your health care.   Here is a summary and plan from your visit.  Your list of diagnoses today/Problem List: Encounter Diagnoses  Name Primary?  . Medicare annual wellness visit, initial Yes  . Aortic valve stenosis, etiology of cardiac valve disease unspecified   . Atherosclerosis of native artery of both lower extremities with intermittent claudication (HCC)   . Coronary artery disease involving native coronary artery of native heart with angina pectoris (HCC)   . Essential hypertension   . Gastroesophageal reflux disease without esophagitis   . 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 Type 2 diabetes mellitus with complication, without long-term current use of insulin (HCC)   . Abnormal chest CT   . Hyperlipidemia, unspecified hyperlipidemia type   . Vaccine counseling   . Screening for prostate cancer   . 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 Need for hepatitis C screening test   . Screening for HIV (human immunodeficiency virus)   . Other fatigue   . Pleural plaque      Cancer Screening Colorectal cancer screening: we will refer you back to the stomach specialist/Gastroenterology as you are due for repeat colonoscopy. Your last one was 10 years ago  Prostate cancer screening: your prostate lab was normal 10/2016, and your prostate is mildly enlarged today   Osteoporosis Screening Bone Density Screening for bone health:  If you have lost height over time, we would recommend a bone density screen   Cardiovascular Screening Blood pressure: your blood pressure is at goal BP Readings from Last 3 Encounters:  06/14/17 132/80  03/14/17 118/86  11/04/16 132/70     Abdominal Aortic Aneurysm Screen Call your insurance to see if they will cover a abdominal screening ultrasound to rule out AAA abdominal aortic aneurysm   Diabetes   We will recheck labs today   Weight Screen/BMI Screen  Wt Readings from Last 3 Encounters:  06/14/17 189 lb 9.6 oz (86 kg)  03/14/17 200 lb 12.8 oz (91.1 kg)  11/04/16 198 lb 6.4 oz (90 kg)    Your height to weight ratio is Body mass index is 29.7 kg/m.  Reference Range:  Underweight: BMI less than 18.5.  Normal weight: BMI between 18.5 and 24.9.  Overweight: BMI between 25 and 29.9.  Obese: BMI of 30 and above.   Your listed Vaccines/Immunizations: Immunization History  Administered Date(s) Administered  . Influenza,inj,Quad PF,36+ Mos 02/03/2016, 11/04/2016    I recommend the following vaccines: Please call your insurer to see if they will cover the following vaccines that are recommended:  Shingrix  Pneumococcal  Tetanus Diptheria    GENERAL RECOMMENDATIONS FOR GOOD HEALTH:  Supplements:  . Take a daily baby Aspirin 81mg  at bedtime for heart health unless you have a history of gastrointestinal bleed, allergy to aspirin, or are already taking higher dose Aspirin or other antiplatelet or blood thinner medication.   . Consume 1200 mg of Calcium daily through dietary calcium or supplement if you are male age 25 or older, or men 5 and older.   Men aged 67-70 should consume 1000 mg of Calcium daily. . Take 600 IU of Vitamin D daily.  Take 800 IU of Calcium daily if you are older than age 93.  . Take a general multivitamin daily.   Healthy diet: Eat a variety of foods, including fruits, vegetables, vegetable protein such  as beans, lentils, tofu, and grains, such as rice.  Limit meat or animal protein, but if you eat meat, choose leans cuts such as chicken, fish, or [place].  Drink plenty of water daily.  Decrease saturated fat in the diet, avoid lots of red meat, processed foods, sweets, fast foods, and fried foods.  Limit salt and caffeine intake.  Exercise: Aerobic exercise helps maintain good heart health. Weight bearing exercise helps keep bones and muscles  working strong.  We recommend at least 30-40 minutes of exercise most days of the week.   Fall prevention: Falls are the leading cause of injuries, accidents, and accidental deaths in people over the age of 66. Falling is a real threat to your ability to live on your own.  Causes include poor eyesight or poor hearing, illness, poor lighting, throw rugs, clutter in your home, and medication side effects causing dizziness or balance problems.  Such medications can include medications for depression, sleep problems, high blood pressure, diabetes, and heart conditions.   PREVENTION  Be sure your home is as safe as possible. Here are some tips:  Wear shoes with non-skid soles (not house slippers).   Be sure your home and outside area are well lit.   Use night lights throughout your house, including hallways and stairways.   Remove clutter and clean up spills on floors and walkways.   Remove throw rugs or fasten them to the floor with carpet tape. Tack down carpet edges.   Do not place electrical cords across pathways.   Install grab bars in your bathtub, shower, and toilet area. Towel bars should not be used as a grab bar.   Install handrails on both sides of stairways.   Do not climb on stools or stepladders. Get someone else to help with jobs that require climbing.   Do not wax your floors at all, or use a non-skid wax.   Repair uneven or unsafe sidewalks, walkways or stairs.   Keep frequently used items within reach.   Be aware of pets so you do not trip.  Get regular check-ups from your doctor, and take good care of yourself:  Have your eyes checked every year for vision changes, cataracts, glaucoma, and other eye problems. Wear eyeglasses as directed.   Have your hearing checked every 2 years, or anytime you or others think that you cannot hear well. Use hearing aids as directed.   See your caregiver if you have foot pain or corns. Sore feet can contribute to falls.   Let your  caregiver know if a medicine is making you feel dizzy or making you lose your balance.   Use a cane, walker, or wheelchair as directed. Use walker or wheelchair brakes when getting in and out.   When you get up from bed, sit on the side of the bed for 1 to 2 minutes before you stand up. This will give your blood pressure time to adjust, and you will feel less dizzy.   If you need to go to the bathroom often, consider using a bedside commode.  Disease prevention:  If you smoke or chew tobacco, find out from your caregiver how to quit. It can literally save your life, no matter how long you have been a tobacco user. If you do not use tobacco, never begin. Medicare does cover some smoking cessation counseling.  Maintain a healthy diet and normal weight. Increased weight leads to problems with blood pressure and diabetes. We check your height, weight, and  BMI as part of your yearly visit.  The Body Mass Index or BMI is a way of measuring how much of your body is fat. Having a BMI above 27 increases the risk of heart disease, diabetes, hypertension, stroke and other problems related to obesity. Your caregiver can help determine your BMI and based on it develop an exercise and dietary program to help you achieve or maintain this important measurement at a healthful level.  High blood pressure causes heart and blood vessel problems.  Persistent high blood pressure should be treated with medicine if weight loss and exercise do not work.  We check your blood pressure as part of your yearly visit.  Avoid drinking alcohol in excess (more than two drinks per day).  Avoid use of street drugs. Do not share needles with anyone. Ask for professional help if you need assistance or instructions on stopping the use of alcohol, cigarettes, and/or drugs.  Brush your teeth twice a day with fluoride toothpaste, and floss once a day. Good oral hygiene prevents tooth decay and gum disease. The problems can be painful,  unattractive, and can cause other health problems. Visit your dentist for a routine oral and dental checkup and preventive care every 6-12 months.   See your eye doctor yearly for routine screening for things like glaucoma.  Look at your skin regularly.  Use a mirror to look at your back. Notify your caregivers of changes in moles, especially if there are changes in shapes, colors, a size larger than a pencil eraser, an irregular border, or development of new moles.  Safety:  Use seatbelts 100% of the time, whether driving or as a passenger.  Use safety devices such as hearing protection if you work in environments with loud noise or significant background noise.  Use safety glasses when doing any work that could send debris in to the eyes.  Use a helmet if you ride a bike or motorcycle.  Use appropriate safety gear for contact sports.  Talk to your caregiver about gun safety.  Use sunscreen with a SPF (or skin protection factor) of 15 or greater.  Lighter skinned people are at a greater risk of skin cancer. Don't forget to also wear sunglasses in order to protect your eyes from too much damaging sunlight. Damaging sunlight can accelerate cataract formation.   If you have multiple sexual partners, or if you are not in a monogamous relationship, practice safe sex. Use condoms. Condoms are used to help reduce the spread of sexually transmitted infections (or STIs).  Consider an HIV test if you have never been tested.  Consider routine screening for STIs if you have multiple sexual partners.   Keep carbon monoxide and smoke detectors in your home functioning at all times. Change the batteries every 6 months or use a model that plugs into the wall or is hard wired in.   END OF LIFE PLANNING/ADVANCED DIRECTIVES Advance health-care planning is deciding the kind of care you want at the end of life. While alert competent adults are able to exercise their rights to make health care and financial decisions,  problems arise when an individual becomes unconscious, incapacitated, or otherwise unable to communicate or make such decisions. Advance health care directives are the legal documents in which you give written instructions about your choices limited, aggressive or palliative care if, in the future, you cannot speak for yourself.  Advanced directives include the following: A Health Care Power of Attorney allows you to appoint someone to act  as your health care agent to make health care decisions for you should it be determined by your health care provider that you are no longer able to make these decisions for yourself.  A Living Will is a legal document in which you can declare that under certain conditions you desire your life not be prolonged by extraordinary or artificial means during your last illness or when you are near death. We can provide you with sample advanced directives, you can get an attorney to prepare these for you, or you can visit [place] Secretary of State's website for additional information and resources at [email or web address]  Further, I recommend you have an attorney prepare a Will and Durable Power of Attorney if you haven't done so already.  Please get [place] a copy of your health care Advanced Directives.   PREVENTATIV E CARE RECOMMENDATIONS:  Vaccinations: We recommend the following vaccinations as part of your preventative care:  Pneumococcal vaccine is recommended to protect against certain types of pneumonia.  This is normally recommended for adults age 52 or older once, or up to every 5 years for those at high risk.  The vaccine is also recommended for adults younger than 66 years old with certain underlying conditions that make them high risk for pneumonia.  Influenza vaccine is recommended to protect against seasonal influenza or "the flu." Influenza is a serious disease that can lead to hospitalization and sometimes even death. Traditional flu vaccines  (called trivalent vaccines) are made to protect against three flu viruses; an influenza A (H1N1) virus, an influenza A (H3N2) virus, and an influenza B virus. In addition, there are flu vaccines made to protect against four flu viruses (called "quadrivalent" vaccines). These vaccines protect against the same viruses as the trivalent vaccine and an additional B virus.  We recommend the high dose influenza vaccine to those 65 years and older.  Hepatitis B vaccine to protect against a form of infection of the liver by a virus acquired from blood or body fluids, particularly for high risk groups.  Td or Tdap vaccine to protect against Tetanus, diphtheria and pertussis which can be very serious.  These diseases are caused by bacteria.  Diphtheria and pertussis are spread from person to person through coughing or sneezing.  Tetanus enters the body through cuts, scratches, or wounds.  Tetanus (Lockjaw) causes painful muscle tightening and stiffness, usually all over the body.  Diphtheria can cause a thick coating to form in the back of the throat.  It can lead to breathing problems, paralysis, heart failure, and death.  Pertussis (Whooping Cough) causes severe coughing spells, which can cause difficulty breathing, vomiting and disturbed sleep.  Td or Tdap is usually given every 10 years.  Shingles vaccine to protect against Varicella Zoster if you are older than age 71, or younger than 66 years old with certain underlying illness.    Cancer Screening: Most routine colon cancer screening begins at the age of 41.  Subsequent colonoscopies are performed either every 5-10 years for normal screening, or every 2-5 years for higher risks patients, up until age 4 years of age. Annual screening is done with easy to use take-home tests to check for hidden blood in the stool called hemoccult tests.  Sigmoidoscopy or colonoscopy can detect the earliest forms of colon cancer and is life saving. These tests use a small camera  at the end of a tube to directly examine the colon.   Osteoporosis Screening: Screening for osteoporosis usually begins at age 36  for women, and can be done as frequent as every 2 years.  However, women or men with higher risk of osteoporosis may be screened earlier than age 46.  Osteoporosis or low bone mass is diminished bone strength from alterations in bone architecture leading to bone fragility and increased fracture risk.     Cardiovascular Screening: Fat and cholesterol leaves deposits in your arteries that can block them. This causes heart disease and vessel disease elsewhere in your body.  If your cholesterol is found to be high, or if you have heart disease or certain other medical conditions, then you may need to have your cholesterol monitored frequently and be treated with medication. Cardiovascular screening in the form of lab tests for cholesterol, HDL and triglycerides can be done every 5 years.  A screening electrocardiogram can be done as part of the Welcome to Medicare physical.  Diabetes Screening: Diabetes screening can be done at least every 3 years for those with risk factors,  or every 6-6months for prediabetic patients.  Screening includes fasting blood sugar test or glucose tolerance test.  Risk factors include hypertension, dyslipidemia, obesity, previously abnormal glucose tests, family history of diabetes, age 79 years or older, and history of gestations diabetes.   AAA (abdominal aortic aneurysm) Screening: Medicare allows for a one time ultrasound to screen for abdominal aortic aneurysm if done as a referral as part of the Welcome to Medicare exam.  Men eligible for this screening include those men between age 66-7 years of age who have smoked at least 100 cigarettes in his lifetime and/or has a family history of AAA.  HIV Screening:  Medicare allows for yearly screening for patients at high risk for contracting HIV disease.

## 2017-06-15 ENCOUNTER — Other Ambulatory Visit: Payer: Self-pay

## 2017-06-15 ENCOUNTER — Other Ambulatory Visit: Payer: Self-pay | Admitting: Medical

## 2017-06-15 DIAGNOSIS — I10 Essential (primary) hypertension: Secondary | ICD-10-CM

## 2017-06-15 DIAGNOSIS — E118 Type 2 diabetes mellitus with unspecified complications: Secondary | ICD-10-CM

## 2017-06-15 LAB — HIV ANTIBODY (ROUTINE TESTING W REFLEX): HIV 1&2 Ab, 4th Generation: NONREACTIVE

## 2017-06-15 LAB — HEMOGLOBIN A1C
HEMOGLOBIN A1C: 5.3 % (ref <5.7)
MEAN PLASMA GLUCOSE: 105 mg/dL

## 2017-06-15 MED ORDER — OMEPRAZOLE 40 MG PO CPDR: 40.0000 mg | DELAYED_RELEASE_CAPSULE | Freq: Every day | ORAL | 3 refills | Status: DC

## 2017-06-15 MED ORDER — ASPIRIN 81 MG PO TABS: 81.0000 mg | ORAL_TABLET | Freq: Every day | ORAL | 3 refills | Status: DC

## 2017-06-15 MED ORDER — METFORMIN HCL 500 MG PO TABS: ORAL_TABLET | ORAL | 3 refills | Status: DC

## 2017-06-15 MED ORDER — AMLODIPINE BESYLATE 10 MG PO TABS: 10.0000 mg | ORAL_TABLET | Freq: Every day | ORAL | 3 refills | Status: DC

## 2017-06-15 MED ORDER — LOSARTAN POTASSIUM 100 MG PO TABS: 100.0000 mg | ORAL_TABLET | Freq: Every day | ORAL | 3 refills | Status: DC

## 2017-06-15 MED ORDER — METFORMIN HCL 500 MG PO TABS
ORAL_TABLET | ORAL | 3 refills | Status: DC
Start: 2017-06-15 — End: 2017-07-12

## 2017-06-15 MED ORDER — METOPROLOL TARTRATE 25 MG PO TABS: 25.0000 mg | ORAL_TABLET | Freq: Two times a day (BID) | ORAL | 3 refills | Status: DC

## 2017-06-15 MED ORDER — LOVASTATIN 40 MG PO TABS: 40.0000 mg | ORAL_TABLET | Freq: Every day | ORAL | 3 refills | Status: DC

## 2017-06-16 ENCOUNTER — Telehealth: Payer: Self-pay | Admitting: Medical

## 2017-06-16 ENCOUNTER — Telehealth: Payer: Self-pay

## 2017-06-16 NOTE — Telephone Encounter (Signed)
Dorinda Hillarsha, GI sent me an email as below.  Not sure if you are aware of this.  Hi Lawton Dollinger,    Patient is requesting to be referred back to Dr. E[facility]Magod at [facility]. His last colon was on 01/06/2007 by Dr. Ewing SchleinMagod.    Thank You  Judeth CornfieldStephanie

## 2017-06-16 NOTE — Telephone Encounter (Signed)
Pt is not able to be scheduled with [facility] due to being dismissed in 2008. PT is aware per Lad[facility]onda at [facility].

## 2017-06-16 NOTE — Telephone Encounter (Signed)
Sent referral to dr.magod

## 2017-06-20 NOTE — Telephone Encounter (Signed)
Called and spoke with [facility] , about this resent referral to [facility]

## 2017-06-21 DIAGNOSIS — I350 Nonrheumatic aortic (valve) stenosis: Secondary | ICD-10-CM | POA: Diagnosis not present

## 2017-06-21 DIAGNOSIS — I119 Hypertensive heart disease without heart failure: Secondary | ICD-10-CM | POA: Diagnosis not present

## 2017-06-21 DIAGNOSIS — I2510 Atherosclerotic heart disease of native coronary artery without angina pectoris: Secondary | ICD-10-CM | POA: Diagnosis not present

## 2017-06-21 DIAGNOSIS — E782 Mixed hyperlipidemia: Secondary | ICD-10-CM | POA: Diagnosis not present

## 2017-06-27 ENCOUNTER — Telehealth: Payer: Self-pay | Admitting: Medical

## 2017-06-27 NOTE — Telephone Encounter (Signed)
Pt's wife, Elinor DodgeGwendolyn, called stating not to schedule colonoscopy yet. They would like to call to let us know when pt is ready to be scheduled for colonoscopy because he has several things going on with his heart doctor right now.

## 2017-06-27 NOTE — Telephone Encounter (Signed)
Okay 

## 2017-07-06 DIAGNOSIS — I2510 Atherosclerotic heart disease of native coronary artery without angina pectoris: Secondary | ICD-10-CM | POA: Diagnosis not present

## 2017-07-06 DIAGNOSIS — I739 Peripheral vascular disease, unspecified: Secondary | ICD-10-CM | POA: Diagnosis not present

## 2017-07-06 DIAGNOSIS — I350 Nonrheumatic aortic (valve) stenosis: Secondary | ICD-10-CM | POA: Diagnosis not present

## 2017-07-10 NOTE — H&P (Signed)
OFFICE VISIT NOTES COPIED TO EPIC FOR DOCUMENTATION  . History of Present Illness Jonathan Brown(Chandra K. Vyas MD; 06/21/2017 8:55 PM) Patient words: Last O/V 03/10/2016; F/U for Fatigue, Dyspnea, Hx of Aortic Stenosis.  The patient is a 66 year old male who presents for a follow-up for Aortic stenosis. [name] is 66 years old African-American male. He has severe aortic stenosis by transthoracic echo. as well as TEE. His TEE on 04/30/2014 had revealed severely calcified bicuspid aortic valve with peak gradient 82, mean gradient 51 and aortic valve area of 0.74 cm2.  He also has mild nonobstructive CAD. Left and right heart catheterization 0n 04/02/2014 revealed mild luminal irregularities of LAD, Cx, Ramus 1 & Ramus 2 & RCA. Ascending aorta and descending aorta normal without aneurysm. Bicuspid aortic valve. Valve not crossed. Normal right heart catheterization with preserved cardiac output and cardiac index.  Patient has returned for follow-up after 15 months. He has h/o exertional shortness of breath, gradually getting worse. He feels short of breath on walking for less than one block. He denies any dyspnea at rest and there is no orthopnea or PND. He is complaining of feeling tired and fatigued on work.  No complaints of chest pain, tightness, pressure or heaviness. No palpitation, sudden heart racing or irregular heart beat. No dizziness, near syncope or syncope.  Patient has had pain in the calves on walking for 1 block, slightly increased from before. No history of leg pain at rest and no cramps. No history of swelling on the legs.  Patient has hypertension, diabetes mellitus type 2 and hypercholesterolemia. He does not smoke.  No history of thyroid problems. No history of TIA or CVA.   Problem List/Past Medical (April Rinaldo RatelGarrison; 06/21/2017 1:55 PM) Gastroesophageal reflux disease without esophagitis (K21.9)  Type II diabetes mellitus, well controlled (E11.9)  Sinus tachycardia (R00.0)   Dyspnea (R06.00)  Claudication (I73.9)  Lower extremity arterial duplex 03/06/2014: 1. No hemodynamically significant stenosis is identified in the right lower extremity arterial system. This exam reveals normal perfusion of the right lower extremity. Right ABI 1.11. Diffuse plaque noted. 2. Significant velocity increase at left SFA-mid suggests hemodynamically significant stenosis of >50%. This exam reveals normal perfusion of the left lower extremity. Left ABI 1.02. Diffuse mixed plaque noted in the LE arterial tree. Benign essential HTN (I10)  Mixed hyperlipidemia (E78.2)  Aortic stenosis, severe (I35.0)  Echocardiogram 02/24/2016: 1. Left ventricle cavity is normal in size. Moderate concentric hypertrophy of the left ventricle. Basal septal hypertrophy. Normal global wall motion. Doppler evidence of grade II (pseudonormal) diastolic dysfunction. Diastolic dysfunction findings suggests elevated LA/LV end diastolic pressure. Calculated EF 57%. 2. Moderate calcification of the aortic valve annulus. Moderate aortic valve leaflet calcification. Severely restricted aortic valve leaflets. Severely calcified aortic valve. Probably bicuspid aortic valve with fused left and noncoronary cusps. Peak gradient of 91 and mean gradient of 55 mmHg, calculated aortic valve area of 0.87 cm. Gradients suggest critical aortic stenosis. Compared to the study done on 03/14/2014, mean gradient has worsened from 43.5 mmHg. 3. Trace mitral regurgitation. Moderate calcification of the mitral valve annulus. MiTEE- 04/30/2014- TEE- Severely calcified bicuspid aortic valve with severe stenosis. Peak gradient-82, mean gradient-51, aortic valve area- 0.74 cm2. Coronary artery disease, non-occlusive (I25.10)  Left and right heart catheterization 04/02/2014: Mild noncritical coronary artery disease. Mild luminal irregularities of LAD, Cx, Ramus 1 & Ramus 2 & RCA. Ascending aorta and descending aorta normal without aneurysm.  Bicuspid aortic valve. Valve not crossed. Normal right heart catheterization  with preserved cardiac output and cardiac index. Hypertension with heart disease (I11.9)   Allergies (April Garrison; 06/25/17 1:55 PM) No Known Drug Allergies [02/20/2014]:  Family History (April Rinaldo Ratel; 06-25-2017 2:10 PM) Mother  Deceased. at age 32; from old age Father  Deceased. at age 27; from unknown reasons Sister 2  1-Older; 1-Younger; No known Heart conditions Brother 2  Deceased. 1 at age 54 from an MI; 1 from DM  Social History (April Rinaldo Ratel; 2017/06/25 1:55 PM) Current tobacco use  Never smoker. Alcohol Use  Occasional alcohol use. Marital status  Married. Living Situation  Lives with spouse. Number of Children  0.  Past Surgical History (April Garrison; June 25, 2017 1:55 PM) None [02/20/2014]:  Medication History Jonathan Many, MD; June 25, 2017 9:39 PM) MetFORMIN HCl (500MG  Tablet, 1 Oral twice daily) Active. NexIUM (40MG  Capsule DR, 1 Oral daily) Active. (Omeprazole) Aspirin (81MG  Tablet, 1 Oral daily) Active. Metoprolol Tartrate (25MG  Tablet, 1 Oral two times daily) Active. Lovastatin (40MG  Tablet, 1 Oral daily) Active. AmLODIPine Besylate (10MG  Tablet, 1/2 Oral daily) Active. (Dose changed to 1/2 tab. daily from 06-25-17) Losartan Potassium (100MG  Tablet, 1 Oral daily) Active. Tylenol (325MG  Tablet, 2 Oral daily) Active. Medications Reconciled (Pt brought medications)  Diagnostic Studies History (April Garrison; 06-25-17 2:13 PM) Echocardiogram [02/2014]: 1. Left ventricular cavity is normal in size. Moderate to severe concentric hypertrophy. Mild septal notching. Diastolic filling with impaired relaxation pattern and elevated mean left atrial pressure. Normal global wall motion. Normal systolic global function. Calculated EF 67%. Doppler evidence of Grade I (impaired) diastolic dysfunction. 2. Left atrial cavity is moderately dilated. 3. Bicuspid aortic valve.  Severe calcification of the aortic annulus. Severe aortic stenosis. Moderate aortic valve leaflet calcification. Aortic valve leaflet mobility is severely restricted. 4. Trace mitral regurgitation. Trace mitral stenosis. Mild mitral valve leaflet calcification. Mitral anterior leaflet mobility is moderately restricted. 5. Tricuspid valve structurally normal. Trace tricuspid regurgitation. No pulmonary hypertension. Nuclear stress test [02/2014]: 1. Resting EKG NSR, LAD, LAFB, RBBB. Poor R wave progression. LVH, RVH. Non specific ST-T changes. Stress EKG was non diagnostic for ischemia. No ST-T changes of ischemia noted with pharmacologic stress testing. Stress symptoms included shortness of breath. Stress terminated due to completion of protocol. 2. Perfusion imaging study demonstrates inferior wall moderate-sized defect extending from the base towards the mid ventricle in the form of myocardial scar with very minimal infarct ischemia. The summed score is 6 on polar plot images with the difference of 2, suggesting very minimal peri-infarct ischemia. Dynamic QGS images reveal no significant wall motion abnormality. Left ventricular ejection fraction was estimated to be 54%. This episodes a low risk study. Lower Extremity Dopplers [02/2014]: 1. No hemodynamically significant stenosis is identified in the right lower extremity arterial system. This exam reveals normal perfusion of the right lower extremity. Right ABI 1.11. Diffuse plaque noted. 2. Significant velocity increase at left SFA-mid suggests hemodynamically significant stenosis of >50%. This exam reveals normal perfusion of the left lower extremity. Left ABI 1.02. Diffuse mixed plaque noted in the LE arterial tree. Colonoscopy [2010]: removed benign polyps Coronary Angiogram [04/02/2014]:    Review of Systems Jonathan Brown K. Vyas MD; 2017-06-25 8:56 PM)  Note: GENERAL- Feels tired, fatigue. No fever, chills. CARDIO VASCULAR- No chest pain, Has  exertional shortness of breath, No orthopnea or PND. No palpitation, dizziness, fainting. Has hypertension & h/o high cholesterol. No swelling on legs. Has claudication in legs, No cramps. No h/o DVT PULMONARY- No cough, phlegm, wheezing, not feeling congested in chest. GASTROINTESTINAL- No  abdominal pain, nausea, vomiting or diarrhea. No dark tarry stools.Normal appetite. No heartburn. ENDOCRINE- No Thyroid problem, No feeling of excessive heat or cold, No polydipsia or polyuria. Has Diabetes. NEUROLOGICAL- No focal motor or sensory symptoms, Good coordination. No seizures. MUSCULOSKELETAL- No generalized myalgias or muscle weakness. No joint swelling SKIN- No skin rash, No pruritus HEMATOLOGY- No anemia, petechiae, excessive bruising, epistaxis, GI bleed or any abnormal bleeding.   Vitals (April Garrison; 06/21/2017 2:20 PM) 06/21/2017 1:56 PM Weight: 189.56 lb Height: 69in Body Surface Area: 2.02 m Body Mass Index: 27.99 kg/m  Pulse: 68 (Regular)  P.OX: 97% (Room air) BP: 104/64 (Sitting, Left Arm, Standard)  Physical Exam Jonathan Brown K. Vyas MD; 06/21/2017 9:00 PM) The physical exam findings are as follows: Note:GENERAL APPEARANCE- Alert, Oriented. Well built, overweight. HEENT- Unremarkable, fundi were not examined. NECK- No JVD. Carotid pulses are 2+, conducted aortic murmur is audible on both sides. No thyromegaly. No lymphadenopathy. HEART- Palpation- Apex beat is palpable in left 5th intercostal space, inside midclavicular line. No heave or thrills palpable. Auscultation- Normal S1, S2. No gallops. Gr. 3/6 ESM is audible in aortic & LPSA, Murmur is conducted to the neck. CHEST- Normal shape, Normal percussion. Auscultation- Normal breath sounds, No crepitations. No wheezing. ABDOMEN- Palpation- Soft, Nontender. No hepatosplenomegaly. No masses felt. Auscultation- No bruits audible. EXTREMITIES- No Clubbing or Cyanosis. No edema on legs or feet. Skin on legs is  very dry. PERIPHERAL PULSES- Both femoral pulses- 1-2+, No bruits audible. Right posterior tibial pulse- faint. Lt. posterior tibial & both dorsalis pedis pulses- Not palpable.    Assessment & Plan Jonathan Brown K. Vyas MD; 06/21/2017 9:42 PM) Aortic stenosis, severe (I35.0) Story:Echocardiogram 07/06/2017: Left ventricle cavity is normal in size. Moderate concentric hypertrophy of the left ventricle. Normal global wall motion. Doppler evidence of grade II (pseudonormal) diastolic dysfunction, elevated LAP. Calculated EF 59%. Mild calcification of the aortic valve annulus.  Moderate aortic valve leaflet calcification. Severely restricted aortic valve leaflets. Severe aortic valve stenosis. Aortic valve peak pressure gradient of 76 and mean gradient of  47  mmHg, calculated aortic valve area  0.65 cm. Mild (Grade I) mitral regurgitation. Mild to moderate tricuspid regurgitation. Moderate pulmonary hypertension. Pulmonary artery systolic pressure is estimated at 46 mm Hg. IVC is dilated with poor inspiration collapse consistent with elevated right atrial pressure. Compared to the study done on 02/24/2016, no significant change in the study.  Coronary artery disease, non-occlusive (I25.10) Story: Left and right heart catheterization 04/02/2014: Mild noncritical coronary artery disease. Mild luminal irregularities of LAD, Cx, Ramus 1 & Ramus 2 & RCA. Ascending aorta and descending aorta normal without aneurysm. Bicuspid aortic valve. Valve not crossed. Normal right heart catheterization with preserved cardiac output and cardiac index. Future Plans 07/04/2017: METABOLIC PANEL, BASIC [id number]) - one time 07/04/2017: CBC & PLATELETS (AUTO) ([id number]) - one time 07/04/2017: PT (PROTHROMBIN TIME) (29562) - one time Hypertension with heart disease (I11.9) Mixed hyperlipidemia (E78.2) Claudication (I73.9) Story: Lower extremity arterial duplex 03/06/2014: 1. No hemodynamically significant stenosis is identified in the  right lower extremity arterial system. This exam reveals normal perfusion of the right lower extremity. Right ABI 1.11. Diffuse plaque noted. 2. Significant velocity increase at left SFA-mid suggests hemodynamically significant stenosis of >50%. This exam reveals normal perfusion of the left lower extremity. Left ABI 1.02. Diffuse mixed plaque noted in the LE arterial tree. Future Plans 07/06/2017: Complete duplex ultrasound of arteries of lower extremity (13086) - one time Note:EKG-sinus rhythm, left atrial abnormality, RBBB, LAHB, LVH, nonspecific  ST-T wave abnormality  Patient has critical aortic stenosis and is becoming increasingly symptomatic. I had advised him to have aortic valve replacement Brown times in the past but he has been putting it off. I again had a lengthy talk with him and explained to him the need for aortic valve replacement. Indications, procedure and possible complications were explained. Patient is now agreeable. We will do cardiac catheterization, repeat echocardiogram prior to the surgery.  His claudication has slightly worsened from before. He has been scheduled for lower extremity arterial duplex study.  Systolic blood pressure is towards the lower limits of normal. I have advised him to decrease the dose of amlodipine to 5 mg daily. He will continue all the other present medications.  Secondary prevention was again discussed. I have advised him to continue strict ADA, low-salt, low-cholesterol diet.  I will see him in follow-up after the above tests. It was a 30 minute face-to-face visit, more than half the time was spent in explaining about the condition, management, possible complications of not having surgery etc.  CC: Crosby Oyster, PA.  Signed electronically by Jonathan Many, MD (06/21/2017 9:43 PM)

## 2017-07-12 ENCOUNTER — Telehealth: Payer: Self-pay | Admitting: Medical

## 2017-07-12 ENCOUNTER — Ambulatory Visit (HOSPITAL_COMMUNITY)
Admission: RE | Admit: 2017-07-12 | Discharge: 2017-07-12 | Disposition: A | Discharge status: Home / Self Care | Payer: Medicare HMO | Source: Ambulatory Visit | Attending: Cardiology | Admitting: Cardiology

## 2017-07-12 ENCOUNTER — Encounter (HOSPITAL_COMMUNITY)
Admission: RE | Disposition: A | Discharge status: Home / Self Care | Payer: Self-pay | Source: Ambulatory Visit | Attending: Cardiology

## 2017-07-12 DIAGNOSIS — I2584 Coronary atherosclerosis due to calcified coronary lesion: Secondary | ICD-10-CM | POA: Diagnosis not present

## 2017-07-12 DIAGNOSIS — Z7982 Long term (current) use of aspirin: Secondary | ICD-10-CM | POA: Insufficient documentation

## 2017-07-12 DIAGNOSIS — I350 Nonrheumatic aortic (valve) stenosis: Secondary | ICD-10-CM | POA: Insufficient documentation

## 2017-07-12 DIAGNOSIS — E119 Type 2 diabetes mellitus without complications: Secondary | ICD-10-CM | POA: Insufficient documentation

## 2017-07-12 DIAGNOSIS — K219 Gastro-esophageal reflux disease without esophagitis: Secondary | ICD-10-CM | POA: Insufficient documentation

## 2017-07-12 DIAGNOSIS — Z7984 Long term (current) use of oral hypoglycemic drugs: Secondary | ICD-10-CM | POA: Diagnosis not present

## 2017-07-12 DIAGNOSIS — I119 Hypertensive heart disease without heart failure: Secondary | ICD-10-CM | POA: Diagnosis not present

## 2017-07-12 DIAGNOSIS — E782 Mixed hyperlipidemia: Secondary | ICD-10-CM | POA: Insufficient documentation

## 2017-07-12 DIAGNOSIS — I2510 Atherosclerotic heart disease of native coronary artery without angina pectoris: Secondary | ICD-10-CM | POA: Insufficient documentation

## 2017-07-12 DIAGNOSIS — E118 Type 2 diabetes mellitus with unspecified complications: Secondary | ICD-10-CM

## 2017-07-12 HISTORY — PX: CARDIAC CATHETERIZATION: SHX172

## 2017-07-12 LAB — POCT I-STAT 3, VENOUS BLOOD GAS (G3P V)
Acid-base deficit: 4 mmol/L — ABNORMAL HIGH (ref 0.0–2.0)
Bicarbonate: 21.3 mmol/L (ref 20.0–28.0)
O2 SAT: 66 %
PH VEN: 7.358 (ref 7.250–7.430)
PO2 VEN: 35 mmHg (ref 32.0–45.0)
TCO2: 22 mmol/L (ref 0–100)
pCO2, Ven: 37.9 mmHg — ABNORMAL LOW (ref 44.0–60.0)

## 2017-07-12 LAB — POCT I-STAT 3, ART BLOOD GAS (G3+)
ACID-BASE DEFICIT: 3 mmol/L — AB (ref 0.0–2.0)
Bicarbonate: 21.8 mmol/L (ref 20.0–28.0)
O2 SAT: 98 %
PCO2 ART: 37.9 mmHg (ref 32.0–48.0)
TCO2: 23 mmol/L (ref 0–100)
pH, Arterial: 7.368 (ref 7.350–7.450)
pO2, Arterial: 111 mmHg — ABNORMAL HIGH (ref 83.0–108.0)

## 2017-07-12 LAB — GLUCOSE, CAPILLARY
Glucose-Capillary: 101 mg/dL — ABNORMAL HIGH (ref 65–99)
Glucose-Capillary: 101 mg/dL — ABNORMAL HIGH (ref 65–99)

## 2017-07-12 SURGERY — RIGHT HEART CATH AND CORONARY ANGIOGRAPHY
Anesthesia: LOCAL

## 2017-07-12 MED ORDER — FENTANYL CITRATE (PF) 100 MCG/2ML IJ SOLN
INTRAMUSCULAR | Status: AC
  Filled 2017-07-12: qty 2

## 2017-07-12 MED ORDER — IODIXANOL 320 MG/ML IV SOLN
INTRAVENOUS | Status: DC | PRN
  Administered 2017-07-12: 65 mL via INTRA_ARTERIAL

## 2017-07-12 MED ORDER — SODIUM CHLORIDE 0.9 % WEIGHT BASED INFUSION: 1.0000 mL/kg/h | INTRAVENOUS | Status: DC

## 2017-07-12 MED ORDER — NITROGLYCERIN 1 MG/10 ML FOR IR/CATH LAB
INTRA_ARTERIAL | Status: AC
  Filled 2017-07-12: qty 10

## 2017-07-12 MED ORDER — SODIUM CHLORIDE 0.9 % IV SOLN: 250.0000 mL | INTRAVENOUS | Status: DC | PRN

## 2017-07-12 MED ORDER — SODIUM CHLORIDE 0.9% FLUSH: 3.0000 mL | INTRAVENOUS | Status: DC | PRN

## 2017-07-12 MED ORDER — VERAPAMIL HCL 2.5 MG/ML IV SOLN
INTRAVENOUS | Status: AC
  Filled 2017-07-12: qty 2

## 2017-07-12 MED ORDER — ASPIRIN 81 MG PO CHEW
81.0000 mg | CHEWABLE_TABLET | ORAL | Status: AC
  Administered 2017-07-12: 81 mg via ORAL

## 2017-07-12 MED ORDER — SODIUM CHLORIDE 0.9% FLUSH: 3.0000 mL | Freq: Two times a day (BID) | INTRAVENOUS | Status: DC

## 2017-07-12 MED ORDER — MIDAZOLAM HCL 2 MG/2ML IJ SOLN
INTRAMUSCULAR | Status: DC | PRN
  Administered 2017-07-12: 1 mg via INTRAVENOUS

## 2017-07-12 MED ORDER — HEPARIN (PORCINE) IN NACL 2-0.9 UNIT/ML-% IJ SOLN
INTRAMUSCULAR | Status: AC
  Filled 2017-07-12: qty 500

## 2017-07-12 MED ORDER — MIDAZOLAM HCL 2 MG/2ML IJ SOLN
INTRAMUSCULAR | Status: AC
  Filled 2017-07-12: qty 2

## 2017-07-12 MED ORDER — LIDOCAINE HCL (PF) 1 % IJ SOLN
INTRAMUSCULAR | Status: DC | PRN
  Administered 2017-07-12 (×2): 2 mL via INTRADERMAL

## 2017-07-12 MED ORDER — HEPARIN SODIUM (PORCINE) 1000 UNIT/ML IJ SOLN
INTRAMUSCULAR | Status: AC
  Filled 2017-07-12: qty 1

## 2017-07-12 MED ORDER — SODIUM CHLORIDE 0.9 % WEIGHT BASED INFUSION
3.0000 mL/kg/h | INTRAVENOUS | Status: AC
  Administered 2017-07-12: 3 mL/kg/h via INTRAVENOUS

## 2017-07-12 MED ORDER — ASPIRIN 81 MG PO CHEW
CHEWABLE_TABLET | ORAL | Status: AC
  Administered 2017-07-12: 81 mg via ORAL
  Filled 2017-07-12: qty 1

## 2017-07-12 MED ORDER — HEPARIN (PORCINE) IN NACL 2-0.9 UNIT/ML-% IJ SOLN
INTRAMUSCULAR | Status: AC | PRN
  Administered 2017-07-12: 1000 mL

## 2017-07-12 MED ORDER — LIDOCAINE HCL (PF) 1 % IJ SOLN
INTRAMUSCULAR | Status: DC | PRN
  Administered 2017-07-12: 5 mL via INTRA_ARTERIAL

## 2017-07-12 MED ORDER — LIDOCAINE HCL (PF) 1 % IJ SOLN
INTRAMUSCULAR | Status: AC
  Filled 2017-07-12: qty 30

## 2017-07-12 MED ORDER — IOPAMIDOL (ISOVUE-370) INJECTION 76%
INTRAVENOUS | Status: AC
  Filled 2017-07-12: qty 100

## 2017-07-12 MED ORDER — FENTANYL CITRATE (PF) 100 MCG/2ML IJ SOLN
INTRAMUSCULAR | Status: DC | PRN
  Administered 2017-07-12: 25 ug via INTRAVENOUS

## 2017-07-12 MED ORDER — HEPARIN SODIUM (PORCINE) 1000 UNIT/ML IJ SOLN
INTRAMUSCULAR | Status: DC | PRN
  Administered 2017-07-12: 3000 [IU] via INTRAVENOUS

## 2017-07-12 MED ORDER — METFORMIN HCL 500 MG PO TABS: ORAL_TABLET | ORAL | 3 refills | Status: DC

## 2017-07-12 SURGICAL SUPPLY — 18 items
CATH BALLN WEDGE 5F 110CM (CATHETERS) ×2 IMPLANT
CATH INFINITI 5FR AL1 (CATHETERS) ×2 IMPLANT
CATH INFINITI 5FR JL4 (CATHETERS) ×2 IMPLANT
CATH OPTITORQUE TIG 4.0 5F (CATHETERS) ×2 IMPLANT
DEVICE RAD COMP TR BAND LRG (VASCULAR PRODUCTS) ×2 IMPLANT
DEVICE TORQUE .025-.038 (MISCELLANEOUS) ×2 IMPLANT
GLIDESHEATH SLEND A-KIT 6F 20G (SHEATH) ×2 IMPLANT
GUIDEWIRE .025 260CM (WIRE) ×2 IMPLANT
GUIDEWIRE INQWIRE 1.5J.035X260 (WIRE) ×1 IMPLANT
INQWIRE 1.5J .035X260CM (WIRE) ×2
KIT HEART LEFT (KITS) ×2 IMPLANT
PACK CARDIAC CATHETERIZATION (CUSTOM PROCEDURE TRAY) ×2 IMPLANT
SHEATH GLIDE SLENDER 4/5FR (SHEATH) ×2 IMPLANT
TRANSDUCER W/STOPCOCK (MISCELLANEOUS) ×2 IMPLANT
TUBING ART PRESS 72  MALE/FEM (TUBING) ×1
TUBING ART PRESS 72 MALE/FEM (TUBING) ×1 IMPLANT
TUBING CIL FLEX 10 FLL-RA (TUBING) ×2 IMPLANT
WIRE HITORQ VERSACORE ST 145CM (WIRE) ×2 IMPLANT

## 2017-07-12 NOTE — Discharge Instructions (Signed)
NO METFORMIN/GLUCOPHAGE FOR 2 DAYS ° ° °Radial Site Care °Refer to this sheet in the next few weeks. These instructions provide you with information about caring for yourself after your procedure. Your health care provider may also give you more specific instructions. Your treatment has been planned according to current medical practices, but problems sometimes occur. Call your health care provider if you have any problems or questions after your procedure. °What can I expect after the procedure? °After your procedure, it is typical to have the following: °· Bruising at the radial site that usually fades within 1-2 weeks. °· Blood collecting in the tissue (hematoma) that may be painful to the touch. It should usually decrease in size and tenderness within 1-2 weeks. ° °Follow these instructions at home: °· Take medicines only as directed by your health care provider. °· You may shower 24-48 hours after the procedure or as directed by your health care provider. Remove the bandage (dressing) and gently wash the site with plain soap and water. Pat the area dry with a clean towel. Do not rub the site, because this may cause bleeding. °· Do not take baths, swim, or use a hot tub until your health care provider approves. °· Check your insertion site every day for redness, swelling, or drainage. °· Do not apply powder or lotion to the site. °· Do not flex or bend the affected arm for 24 hours or as directed by your health care provider. °· Do not push or pull heavy objects with the affected arm for 24 hours or as directed by your health care provider. °· Do not lift over 10 lb (4.5 kg) for 5 days after your procedure or as directed by your health care provider. °· Ask your health care provider when it is okay to: °? Return to work or school. °? Resume usual physical activities or sports. °? Resume sexual activity. °· Do not drive home if you are discharged the same day as the procedure. Have someone else drive you. °· You  may drive 24 hours after the procedure unless otherwise instructed by your health care provider. °· Do not operate machinery or power tools for 24 hours after the procedure. °· If your procedure was done as an outpatient procedure, which means that you went home the same day as your procedure, a responsible adult should be with you for the first 24 hours after you arrive home. °· Keep all follow-up visits as directed by your health care provider. This is important. °Contact a health care provider if: °· You have a fever. °· You have chills. °· You have increased bleeding from the radial site. Hold pressure on the site. °Get help right away if: °· You have unusual pain at the radial site. °· You have redness, warmth, or swelling at the radial site. °· You have drainage (other than a small amount of blood on the dressing) from the radial site. °· The radial site is bleeding, and the bleeding does not stop after 30 minutes of holding steady pressure on the site. °· Your arm or hand becomes pale, cool, tingly, or numb. °This information is not intended to replace advice given to you by your health care provider. Make sure you discuss any questions you have with your health care provider. °Document Released: 01/15/2011 Document Revised: 05/20/2016 Document Reviewed: 07/01/2014 °Elsevier Interactive Patient Education © 2018 [employer] ° °

## 2017-07-12 NOTE — Interval H&P Note (Signed)
History and Physical Interval Note:  07/12/2017 10:01 AM  Jonathan Brown  has presented today for surgery, with the diagnosis of arotic stenosis  The various methods of treatment have been discussed with the patient and family. After consideration of risks, benefits and other options for treatment, the patient has consented to  Procedure(s): Right/Left Heart Cath and Coronary Angiography (N/A) as a surgical intervention .  The patient's history has been reviewed, patient examined, no change in status, stable for surgery.  I have reviewed the patient's chart and labs.  Questions were answered to the patient's satisfaction.     Yates DecampGANJI, Marisol Glazer

## 2017-07-12 NOTE — Telephone Encounter (Signed)
pls call Dr. Verl DickerGanji's office about his abnormal ABI screen.   Please ask them if Dr. Jacinto HalimGanji will be seeing him for further eval and intervention of the lower extremity blood flow?   I didn't know if he needs to see a separate vascular surgeon or if Dr. Jacinto HalimGanji does this type of therapy as well?  I wanted to know before we call patient.   The results of his ABI were abnormal.  However, he is in the hospital TODAY 07/12/17 for major heart surgery/valve replacement!

## 2017-07-12 NOTE — Telephone Encounter (Signed)
[facility].

## 2017-07-18 ENCOUNTER — Encounter: Payer: Self-pay | Admitting: Medical

## 2017-07-19 DIAGNOSIS — I2510 Atherosclerotic heart disease of native coronary artery without angina pectoris: Secondary | ICD-10-CM | POA: Diagnosis not present

## 2017-07-19 DIAGNOSIS — I350 Nonrheumatic aortic (valve) stenosis: Secondary | ICD-10-CM | POA: Diagnosis not present

## 2017-07-19 DIAGNOSIS — I119 Hypertensive heart disease without heart failure: Secondary | ICD-10-CM | POA: Diagnosis not present

## 2017-07-19 DIAGNOSIS — E782 Mixed hyperlipidemia: Secondary | ICD-10-CM | POA: Diagnosis not present

## 2017-07-27 ENCOUNTER — Encounter: Payer: Medicare HMO | Admitting: Cardiothoracic Surgery

## 2017-07-29 ENCOUNTER — Institutional Professional Consult (permissible substitution) (INDEPENDENT_AMBULATORY_CARE_PROVIDER_SITE_OTHER): Payer: Medicare HMO | Admitting: Cardiothoracic Surgery

## 2017-07-29 ENCOUNTER — Encounter: Payer: Self-pay | Admitting: Cardiothoracic Surgery

## 2017-07-29 VITALS — BP 145/88 | HR 60 | Resp 20 | Ht 69.0 in | Wt 185.0 lb

## 2017-07-29 DIAGNOSIS — I350 Nonrheumatic aortic (valve) stenosis: Secondary | ICD-10-CM

## 2017-07-29 NOTE — Progress Notes (Signed)
PCP is Tysinger, Kermit Baloavid S, PA-C Referring Provider is Yates DecampGanji, Jay, MD  Chief Complaint  Patient presents with  . Aortic Stenosis    Surgical eval, Cardiac Cath 07/12/17, ECHO 07/06/17, Arterial Duplex Lower extremities 07/06/17  Patient examined, cardiac cath and echocardiograms personally reviewed. Right heart cath data personally reviewed.  HPI: 66 year old AA male with known asymptomatic aortic stenosis followed with serial echo for a probable bicuspid aortic valve presents for evaluation for aortic valve replacement. His echocardiogram shows a significant increase in transvalvular gradient with a mean gradient now of 48 mmHg and peak gradient of 90 mmHg. Aortic valve areas calculated 0.7. He has had associated dyspnea on exertion and decrease in exercise tolerance. He denies syncope or chest pain. Coronary angiography reveals no significant CAD. Right heart catheterization demonstrated normal right heart pressures and cardiac index. He is in a sinus rhythm. There is no family history of cardiac valvular disease. There is no history of rheumatic fever or scarlet fever. Review of his echocardiogram demonstrates normal LV systolic function, normal RV function, no significant MR or TR.  The patient has no active dental complaints but is not seen a dentist in 3 or 4 years. He plans on having a dental exam and cleaning prior to surgery. The patient wishes to delay surgery until late summer because of his family and work schedule.  The patient is a nonsmoker. He has diabetes hypertension and hyperlipidemia.  Past Medical History:  Diagnosis Date  . Aortic stenosis    Dr. Jacinto HalimGanji  . CAD (coronary artery disease)   . Diabetes mellitus without complication (HCC) 2014  . Former smoker   . GERD (gastroesophageal reflux disease)   . Hyperlipidemia   . Hypertension 2012  . PVD (peripheral vascular disease) (HCC)   . 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  COLONOSCOPY  2008  . LEFT AND RIGHT HEART CATHETERIZATION WITH CORONARY ANGIOGRAM N/A 04/02/2014   Procedure: LEFT AND RIGHT HEART CATHETERIZATION WITH CORONARY ANGIOGRAM;  Surgeon: Pamella PertJagadeesh R Ganji, MD;  [facility];  Service: Cardiovascular;  Laterality: N/A;  . TEE WITHOUT CARDIOVERSION N/A 04/30/2014   Procedure: TRANSESOPHAGEAL ECHOCARDIOGRAM (TEE);  Surgeon: Pamella PertJagadeesh R Ganji, MD;  [facility];  Service: Cardiovascular;  Laterality: N/A;    Family History  Problem Relation Age of Onset  . Other Mother        died of old age  . Heart disease Father   . Sleep apnea Sister   . Diabetes Brother        diabetic coma x 3 years  . Alcohol abuse Brother   . Heart disease Brother 48       MI  . Cancer Neg Hx   . Stroke Neg Hx     Social History Social History  Substance Use Topics  . Smoking status: Former Games developermoker  . Smokeless tobacco: Never Used  . Alcohol use No    Current Outpatient Prescriptions  Medication Sig Dispense Refill  . acetaminophen (TYLENOL) 500 MG tablet Take 1,000 mg by mouth daily as needed for moderate pain.    Marland Kitchen. amLODipine (NORVASC) 10 MG tablet Take 1 tablet (10 mg total) by mouth daily. (Patient taking differently: Take 5 mg by mouth daily. ) 90 tablet 3  . aspirin 81 MG tablet Take 1 tablet (81 mg total) by mouth daily. 90 tablet 3  . losartan (COZAAR) 100 MG tablet Take 1 tablet (100 mg total)  by mouth daily. 90 tablet 3  . lovastatin (MEVACOR) 40 MG tablet Take 1 tablet (40 mg total) by mouth at bedtime. 90 tablet 3  . metFORMIN (GLUCOPHAGE) 500 MG tablet TAKE ONE TABLET BY MOUTH TWICE DAILY WITH  MEAL 180 tablet 3  . metoprolol tartrate (LOPRESSOR) 25 MG tablet Take 1 tablet (25 mg total) by mouth 2 (two) times daily. 180 tablet 3  . omeprazole (PRILOSEC) 40 MG capsule Take 1 capsule (40 mg total) by mouth daily. 90 capsule 3  . oxymetazoline (AFRIN) 0.05 % nasal spray Place 1 spray into both nostrils daily as needed for congestion.     . Tetrahydrozoline HCl (VISINE OP) Apply 1 drop to eye daily as needed (dry eyes).     No current facility-administered medications for this visit.     No Known Allergies  Review of Systems  ROS       Review of Systems :  [ y ] = yes, [  ] = no        General :  Weight gain [ yes  ]    Weight loss  [   ]  Fatigue [ yes ]  Fever [  ]  Chills  [  ]                                Weakness  [  ]           HEENT    Headache [  yes]  Dizziness [  ]  Blurred vision [  ] Glaucoma  [  ]                          Nosebleeds [  ] Painful or loose teeth [  ]last dental exam 3 years ago        Cardiac :  Chest pain/ pressure [  ]  Resting SOB [  ] exertional SOB [yes relieved by rest  ]                        Orthopnea [  ]  Pedal edema  [  ]  Palpitations [  ] Syncope/presyncope [ ]                         Paroxysmal nocturnal dyspnea [  ]         Pulmonary : cough [ yes minimal nonproductive ]  wheezing [  ]  Hemoptysis [  ] Sputum [  ] Snoring [  ]                              Pneumothorax [  ]  Sleep apnea [  ]        GI : Vomiting [  ]  Dysphagia [  ]  Melena  [  ]  Abdominal pain [  ] BRBPR [  ]              Heart burn [  ]  Constipation [  ] Diarrhea  [  ] Colonoscopy Mahler.Beck[yes   ]        GU : Hematuria [  ]  Dysuria [  ]  Nocturia [  ] UTI's [  ]        Vascular :  Claudication [  ]  Rest pain [  ]  DVT [  ] Vein stripping [  ] leg ulcers [  ]                          TIA [  ] Stroke [  ]  Varicose veins [  ]        NEURO :  Headaches  [  ] Seizures [  ] Vision changes [  ] Paresthesias [  ]                                       Seizures [  ]        Musculoskeletal :  Arthritis [  ] Gout  [  ]  Back pain [  ]  Joint pain [  ]        Skin :  Rash [  ]  Melanoma [  ] Sores [  ]        Heme : Bleeding problems [  ]Clotting Disorders [  ] Anemia [  ]Blood Transfusion [ ]         Endocrine : Diabetes Mahler.Beck  ] Heat or Cold intolerance [  ] Polyuria [  ]excessive thirst [ ]         Psych :  Depression [  ]  Anxiety [  ]  Psych hospitalizations [  ] Memory change [  ]                                               BP (!) 145/88   Pulse 60   Resp 20   Ht 5\' 9"  (1.753 m)   Wt 185 lb (83.9 kg)   SpO2 98%   BMI 27.32 kg/m  Physical Exam     Physical Exam  General: Well-nourished middle-aged AA male no acute distress HEENT: Normocephalic pupils equal , dentition adequate but abnormal Neck: Supple without JVD, adenopathy, or bruit Chest: Clear to auscultation, symmetrical breath sounds, no rhonchi, no tenderness             or deformity Cardiovascular: Regular rate and rhythm, n 3/6 AS  murmur, no gallop, peripheral pulses             palpable in all extremities Abdomen:  Soft, nontender, no palpable mass or organomegaly Extremities: Warm, well-perfused, no clubbing cyanosis edema or tenderness,              no venous stasis changes of the legs Rectal/GU: Deferred Neuro: Grossly non--focal and symmetrical throughout Skin: Clean and dry without rash or ulceration   Diagnostic Tests: Echocardiogram and cardiac cath and right heart cath data as noted above  Impression: Severe symptomatic aortic stenosis, possible bicuspid aortic valve Diabetes Hypertension  Plan: Patient would benefit from aortic valve replacement using a tissue prosthesis. Prior to surgery he needs a CTA of his thoracic aorta, PFTs, and carotid Dopplers. He wishes to have his surgery later this summer and he will be seen back in 4-6 weeks to schedule surgery. Prior to surgery he will visit his family dentist for dental exam and cleaning.  Mikey Bussing, MD [facility] [phone]

## 2017-08-02 ENCOUNTER — Encounter: Payer: Self-pay | Admitting: Cardiology

## 2017-09-20 ENCOUNTER — Other Ambulatory Visit: Payer: Self-pay | Admitting: Cardiothoracic Surgery

## 2017-09-20 DIAGNOSIS — Z952 Presence of prosthetic heart valve: Secondary | ICD-10-CM

## 2017-09-21 ENCOUNTER — Encounter: Payer: Self-pay | Admitting: Cardiothoracic Surgery

## 2017-09-21 ENCOUNTER — Other Ambulatory Visit: Payer: Self-pay | Admitting: *Deleted

## 2017-09-21 ENCOUNTER — Ambulatory Visit (INDEPENDENT_AMBULATORY_CARE_PROVIDER_SITE_OTHER): Payer: Medicare HMO | Admitting: Cardiothoracic Surgery

## 2017-09-21 ENCOUNTER — Ambulatory Visit
Admission: RE | Admit: 2017-09-21 | Discharge: 2017-09-21 | Disposition: A | Discharge status: Home / Self Care | Payer: Medicare HMO | Source: Ambulatory Visit | Attending: Cardiothoracic Surgery | Admitting: Cardiothoracic Surgery

## 2017-09-21 VITALS — BP 118/78 | HR 76 | Resp 16 | Ht 69.0 in | Wt 190.0 lb

## 2017-09-21 DIAGNOSIS — I350 Nonrheumatic aortic (valve) stenosis: Secondary | ICD-10-CM | POA: Diagnosis not present

## 2017-09-21 DIAGNOSIS — R0789 Other chest pain: Secondary | ICD-10-CM | POA: Diagnosis not present

## 2017-09-21 DIAGNOSIS — Z952 Presence of prosthetic heart valve: Secondary | ICD-10-CM

## 2017-09-21 NOTE — Progress Notes (Signed)
PCP is Tysinger, Kermit Balo, PA-C Referring Provider is Yates Decamp, MD  Chief Complaint  Patient presents with  . Follow-up    discuss surgery for AVR.Marland KitchenMarland KitchenCTA THORACIC AORTA/PFT/DENTAL VISIT...? CAROTID DOPPLER DONE    [id number], cardiac cath and echocardiograms personally reviewed. Right heart cath data personally reviewed. Patient with known diagnosis of severe aortic stenosis establish earlier this summer returns to schedule date for surgery. His echocardiogram demonstrated severe aortic calcified stenosis with aortic valve area of 0.65, ejection fraction of 60% and peak aortic gradient 75 mmHg. Since his last visit he has had some dyspnea with exertion but no presyncope or chest pains or edema.   HPI: 66 year old AA male with known asymptomatic aortic stenosis followed with serial echo for a probable bicuspid aortic valve presents for evaluation for aortic valve replacement. His echocardiogram shows a significant increase in transvalvular gradient with a mean gradient now of 48 mmHg and peak gradient of 80 mmHg. Aortic valve areas calculated 0.7. He has had associated dyspnea on exertion and decrease in exercise tolerance. He denies syncope or chest pain. Coronary angiography reveals no significant CAD. Right heart catheterization demonstrated normal right heart pressures and cardiac index. He is in a sinus rhythm. There is no family history of cardiac valvular disease. There is no history of rheumatic fever or scarlet fever. Review of his echocardiogram demonstrates normal LV systolic function, normal RV function, no significant MR or TR.   The patient has no active dental complaints and practices good dental hygiene. The patient wishes to delay surgery until late summer because of his family and work schedule.   The patient is a nonsmoker. He has diabetes hypertension and hyperlipidemia.      Past Medical History:  Diagnosis Date  . Aortic stenosis    Dr. Jacinto Halim  . CAD (coronary artery  disease)   . Diabetes mellitus without complication (HCC) 2014  . Former smoker   . GERD (gastroesophageal reflux disease)   . Hyperlipidemia   . Hypertension 2012  . PVD (peripheral vascular disease) (HCC)   . 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 COLONOSCOPY  2008  . LEFT AND RIGHT HEART CATHETERIZATION WITH CORONARY ANGIOGRAM N/A 04/02/2014   Procedure: LEFT AND RIGHT HEART CATHETERIZATION WITH CORONARY ANGIOGRAM;  Surgeon: Pamella Pert, MD;  Location: [facility] CATH LAB;  Service: Cardiovascular;  Laterality: N/A;  . TEE WITHOUT CARDIOVERSION N/A 04/30/2014   Procedure: TRANSESOPHAGEAL ECHOCARDIOGRAM (TEE);  Surgeon: Pamella Pert, MD;  Location: [facility] ENDOSCOPY;  Service: Cardiovascular;  Laterality: N/A;    Family History  Problem Relation Age of Onset  . Other Mother        died of old age  . Heart disease Father   . Sleep apnea Sister   . Diabetes Brother        diabetic coma x 3 years  . Alcohol abuse Brother   . Heart disease Brother 48       MI  . Cancer Neg Hx   . Stroke Neg Hx     Social History Social History  Substance Use Topics  . Smoking status: Former Games developer  . Smokeless tobacco: Never Used  . Alcohol use No    Current Outpatient Prescriptions  Medication Sig Dispense Refill  . acetaminophen (TYLENOL) 500 MG tablet Take 1,000 mg by mouth daily as needed for moderate pain.    Marland Kitchen amLODipine (NORVASC) 10 MG tablet Take 1 tablet (10 mg total)  by mouth daily. (Patient taking differently: Take 5 mg by mouth daily. ) 90 tablet 3  . aspirin 81 MG tablet Take 1 tablet (81 mg total) by mouth daily. 90 tablet 3  . losartan (COZAAR) 100 MG tablet Take 1 tablet (100 mg total) by mouth daily. 90 tablet 3  . lovastatin (MEVACOR) 40 MG tablet Take 1 tablet (40 mg total) by mouth at bedtime. 90 tablet 3  . metFORMIN (GLUCOPHAGE) 500 MG tablet TAKE ONE TABLET BY MOUTH TWICE DAILY WITH  MEAL 180 tablet 3  . metoprolol tartrate  (LOPRESSOR) 25 MG tablet Take 1 tablet (25 mg total) by mouth 2 (two) times daily. 180 tablet 3  . omeprazole (PRILOSEC) 40 MG capsule Take 1 capsule (40 mg total) by mouth daily. 90 capsule 3  . oxymetazoline (AFRIN) 0.05 % nasal spray Place 1 spray into both nostrils daily as needed for congestion.    . Tetrahydrozoline HCl (VISINE OP) Apply 1 drop to eye daily as needed (dry eyes).     No current facility-administered medications for this visit.     No Known Allergies  Review of Systems         Review of Systems :  [ y ] = yes, [  ] = no        General :  Weight gain [   ]    Weight loss  [   ]  Fatigue [  ]  Fever [  ]  Chills  [  ]                                Weakness  [  ]           HEENT    Headache [  ]  Dizziness [  ]  Blurred vision [  ] Glaucoma  [  ]                          Nosebleeds [  ] Painful or loose teeth [  ]        Cardiac :  Chest pain/ pressure [  ]  Resting SOB [  ] exertional SOB [ yes ]  history of hypertension                        Orthopnea [  ]  Pedal edema  [  ]  Palpitations [  ] Syncope/presyncope                         Paroxysmal nocturnal dyspnea [  ]         Pulmonary : cough [  ]  wheezing [  ]  Hemoptysis [  ] Sputum [  ] Snoring [  ]                              Pneumothorax [  ]  Sleep apnea [  ]        GI : Vomiting [  ]  Dysphagia [  ]  Melena  [  ]  Abdominal pain [  ] BRBPR [  ]              Heart burn [  ]  Constipation [  ] Diarrhea  [  ]  Colonoscopy [   ]        GU : Hematuria [  ]  Dysuria [  ]  Nocturia [  ] UTI's [  ]        Vascular : Claudication [  ]  Rest pain [  ]  DVT [  ] Vein stripping [  ] leg ulcers [  ]                          TIA [  ] Stroke [  ]  Varicose veins [  ]        NEURO :  Headaches  [  ] Seizures [  ] Vision changes [  ] Paresthesias [  ]                                       Seizures [  ]        Musculoskeletal :  Arthritis [  ] Gout  [  ]  Back pain [  ]  Joint pain [  ]        Skin :  Rash [   ]  Melanoma [  ] Sores [  ]        Heme : Bleeding problems [  ]Clotting Disorders [  ] Anemia [  ]Blood Transfusion         Endocrine : Diabetes [ yes ] Heat or Cold intolerance [  ] Polyuria [  ]excessive thirst         Psych : Depression [  ]  Anxiety [  ]  Psych hospitalizations [  ] Memory change [  ]         Right-hand dominant   No prior history of surgical procedures or general anesthesia    No history of thoracic trauma or pneumothorax                                      BP 118/78 (BP Location: Left Arm, Patient Position: Sitting, Cuff Size: Large)   Pulse 76   Resp 16   Ht  (1.753 m)   Wt 190 lb (86.2 kg)   SpO2 98% Comment: ON RA  BMI 28.06 kg/m  Physical Exam     Physical Exam  General: Middle-aged male no acute distress accompanied by wife HEENT: Normocephalic pupils equal , dentition adequate Neck: Supple without JVD, adenopathy, or bruit Chest: Clear to auscultation, symmetrical breath sounds, no rhonchi, no tenderness             or deformity Cardiovascular: Regular rate and rhythm, 4/6 AS murmur, no gallop, peripheral pulses             palpable in all extremities Abdomen:  Soft, nontender, no palpable mass or organomegaly Extremities: Warm, well-perfused, no clubbing cyanosis edema or tenderness,              no venous stasis changes of the legs Rectal/GU: Deferred Neuro: Grossly non--focal and symmetrical throughout Skin: Clean and dry without rash or ulceration      Diagnostic Tests: Echocardiogram and coronary angiogram images personally reviewed today and discussed with patient and family.  Impression: Severe aortic stenosis, no significant CAD Preserved LV function Diabetes mellitus Hypertension  Plan: Plan aortic valve replacement with a bioprosthetic valve on October 4 at [facility] Procedure indications benefits risks discussed with patient.  Mikey Bussing, MD [facility] [phone]

## 2017-09-26 NOTE — Pre-Procedure Instructions (Signed)
Enedina Finner  09/26/2017      [facility] [place], [place] - [address] [place] [address] Phone: [phone] Fax: [phone]  [facility] - [place], [place] - [address] [place] [address] Phone: [phone] Fax: [phone]    Your procedure is scheduled on Thursday, 09/29/2017.  Report to [facility] Admitting at 0530 A.M.  Call this number if you have problems the morning of surgery:  [phone]   Remember:  Do not eat food or drink liquids after midnight.  Take these medicines the morning of surgery with A SIP OF WATER: Acetaminophen (Tylenol) - if needed Amlodipine (Norvasc) Metoprolol tartrate (Lopressor) Omeprazole (Prilosec) Oxymetazoline (Afrin) - if needed Visine eye drops - if needed  7 days prior to surgery STOP taking any Aleve, Naproxen, Ibuprofen, Motrin, Advil, Goody's, BC's, all herbal medications, fish oil, and all vitamins.  Follow your doctors instructions regarding your Aspirin.  If no instructions were given by the doctor you will need to call the office to get instructions.  Your pre admission RN will also call for those instructions   WHAT DO I DO ABOUT MY DIABETES MEDICATION?  Marland Kitchen Do not take oral diabetes medicines (pills) the morning of surgery - Metformin (Glucophage)      How to Manage Your Diabetes Before and After Surgery  Why is it important to control my blood sugar before and after surgery? . Improving blood sugar levels before and after surgery helps healing and can limit problems. . A way of improving blood sugar control is eating a healthy diet by: o  Eating less sugar and carbohydrates o  Increasing activity/exercise o  Talking with your doctor about reaching your blood sugar goals . High blood sugars (greater than 180 mg/dL) can raise your risk of infections and slow your recovery, so you  will need to focus on controlling your diabetes during the weeks before surgery. . Make sure that the doctor who takes care of your diabetes knows about your planned surgery including the date and location.  How do I manage my blood sugar before surgery? . Check your blood sugar at least 4 times a day, starting 2 days before surgery, to make sure that the level is not too high or low. o Check your blood sugar the morning of your surgery when you wake up and every 2 hours until you get to the Short Stay unit. . If your blood sugar is less than 70 mg/dL, you will need to treat for low blood sugar: o Do not take insulin. o Treat a low blood sugar (less than 70 mg/dL) with  cup of clear juice (cranberry or apple), 4 glucose tablets, OR glucose gel. o Recheck blood sugar in 15 minutes after treatment (to make sure it is greater than 70 mg/dL). If your blood sugar is not greater than 70 mg/dL on recheck, call [phone] for further instructions. . Report your blood sugar to the short stay nurse when you get to Short Stay.  . If you are admitted to the hospital after surgery: o Your blood sugar will be checked by the staff and you will probably be given insulin after surgery (instead of oral diabetes medicines) to make sure you have good blood sugar levels. o The goal for blood sugar control after surgery is 80-180 mg/dL.     Do not wear jewelry.  Do not wear lotions,  powders, or colognes, or deodorant.  Men may shave face and neck.  Do not bring valuables to the hospital.  [facility] is not responsible for any belongings or valuables.  Contacts, eyeglasses, dentures or bridgework may not be worn into surgery.  Leave your suitcase in the car.  After surgery it may be brought to your room.  For patients admitted to the hospital, discharge time will be determined by your treatment team.  Patients discharged the day of surgery will not be allowed to drive home.   Name and phone number of your  driver:    Special instructions:   [facility]- Preparing For Surgery  Before surgery, you can play an important role. Because skin is not sterile, your skin needs to be as free of germs as possible. You can reduce the number of germs on your skin by washing with CHG (chlorahexidine gluconate) Soap before surgery.  CHG is an antiseptic cleaner which kills germs and bonds with the skin to continue killing germs even after washing.  Please do not use if you have an allergy to CHG or antibacterial soaps. If your skin becomes reddened/irritated stop using the CHG.  Do not shave (including legs and underarms) for at least 48 hours prior to first CHG shower. It is OK to shave your face.  Please follow these instructions carefully.   1. Shower the NIGHT BEFORE SURGERY and the MORNING OF SURGERY with CHG.   2. If you chose to wash your hair, wash your hair first as usual with your normal shampoo.  3. After you shampoo, rinse your hair and body thoroughly to remove the shampoo.  4. Use CHG as you would any other liquid soap. You can apply CHG directly to the skin and wash gently with a scrungie or a clean washcloth.   5. Apply the CHG Soap to your body ONLY FROM THE NECK DOWN.  Do not use on open wounds or open sores. Avoid contact with your eyes, ears, mouth and genitals (private parts). Wash genitals (private parts) with your normal soap.  6. Wash thoroughly, paying special attention to the area where your surgery will be performed.  7. Thoroughly rinse your body with warm water from the neck down.  8. DO NOT shower/wash with your normal soap after using and rinsing off the CHG Soap.  9. Pat yourself dry with a CLEAN TOWEL.   10. Wear CLEAN PAJAMAS   11. Place CLEAN SHEETS on your bed the night of your first shower and DO NOT SLEEP WITH PETS.    Day of Surgery: Shower as stated above. Do not apply any deodorants/lotions. Please wear clean clothes to the hospital/surgery center.       Please read over the following fact sheets that you were given.

## 2017-09-27 ENCOUNTER — Encounter (HOSPITAL_COMMUNITY)
Admission: RE | Admit: 2017-09-27 | Discharge: 2017-09-27 | Disposition: A | Discharge status: Home / Self Care | Payer: Medicare HMO | Source: Ambulatory Visit | Attending: Cardiothoracic Surgery | Admitting: Cardiothoracic Surgery

## 2017-09-27 ENCOUNTER — Ambulatory Visit (HOSPITAL_COMMUNITY)
Admission: RE | Admit: 2017-09-27 | Discharge: 2017-09-27 | Disposition: A | Discharge status: Home / Self Care | Payer: Medicare HMO | Source: Ambulatory Visit | Attending: Cardiothoracic Surgery | Admitting: Cardiothoracic Surgery

## 2017-09-27 ENCOUNTER — Ambulatory Visit (HOSPITAL_BASED_OUTPATIENT_CLINIC_OR_DEPARTMENT_OTHER)
Admission: RE | Admit: 2017-09-27 | Discharge: 2017-09-27 | Disposition: A | Discharge status: Home / Self Care | Payer: Medicare HMO | Source: Ambulatory Visit | Attending: Cardiothoracic Surgery | Admitting: Cardiothoracic Surgery

## 2017-09-27 ENCOUNTER — Encounter (HOSPITAL_COMMUNITY): Payer: Self-pay

## 2017-09-27 ENCOUNTER — Other Ambulatory Visit: Payer: Self-pay

## 2017-09-27 DIAGNOSIS — I350 Nonrheumatic aortic (valve) stenosis: Secondary | ICD-10-CM

## 2017-09-27 DIAGNOSIS — E785 Hyperlipidemia, unspecified: Secondary | ICD-10-CM

## 2017-09-27 DIAGNOSIS — M1990 Unspecified osteoarthritis, unspecified site: Secondary | ICD-10-CM | POA: Diagnosis not present

## 2017-09-27 DIAGNOSIS — I10 Essential (primary) hypertension: Secondary | ICD-10-CM

## 2017-09-27 DIAGNOSIS — I272 Pulmonary hypertension, unspecified: Secondary | ICD-10-CM | POA: Diagnosis not present

## 2017-09-27 DIAGNOSIS — I517 Cardiomegaly: Secondary | ICD-10-CM

## 2017-09-27 DIAGNOSIS — Z01818 Encounter for other preprocedural examination: Secondary | ICD-10-CM

## 2017-09-27 DIAGNOSIS — I442 Atrioventricular block, complete: Secondary | ICD-10-CM | POA: Diagnosis not present

## 2017-09-27 DIAGNOSIS — J90 Pleural effusion, not elsewhere classified: Secondary | ICD-10-CM | POA: Diagnosis not present

## 2017-09-27 DIAGNOSIS — Z01810 Encounter for preprocedural cardiovascular examination: Secondary | ICD-10-CM

## 2017-09-27 DIAGNOSIS — I25119 Atherosclerotic heart disease of native coronary artery with unspecified angina pectoris: Secondary | ICD-10-CM | POA: Diagnosis not present

## 2017-09-27 DIAGNOSIS — K219 Gastro-esophageal reflux disease without esophagitis: Secondary | ICD-10-CM | POA: Diagnosis not present

## 2017-09-27 DIAGNOSIS — I2510 Atherosclerotic heart disease of native coronary artery without angina pectoris: Secondary | ICD-10-CM | POA: Insufficient documentation

## 2017-09-27 DIAGNOSIS — Z01812 Encounter for preprocedural laboratory examination: Secondary | ICD-10-CM | POA: Insufficient documentation

## 2017-09-27 DIAGNOSIS — I70213 Atherosclerosis of native arteries of extremities with intermittent claudication, bilateral legs: Secondary | ICD-10-CM | POA: Insufficient documentation

## 2017-09-27 DIAGNOSIS — E119 Type 2 diabetes mellitus without complications: Secondary | ICD-10-CM

## 2017-09-27 DIAGNOSIS — E1151 Type 2 diabetes mellitus with diabetic peripheral angiopathy without gangrene: Secondary | ICD-10-CM | POA: Diagnosis not present

## 2017-09-27 DIAGNOSIS — I359 Nonrheumatic aortic valve disorder, unspecified: Secondary | ICD-10-CM | POA: Diagnosis not present

## 2017-09-27 DIAGNOSIS — K449 Diaphragmatic hernia without obstruction or gangrene: Secondary | ICD-10-CM

## 2017-09-27 DIAGNOSIS — I959 Hypotension, unspecified: Secondary | ICD-10-CM | POA: Diagnosis not present

## 2017-09-27 HISTORY — DX: Unspecified osteoarthritis, unspecified site: M19.90

## 2017-09-27 LAB — CBC
HCT: 38.6 % — ABNORMAL LOW (ref 39.0–52.0)
Hemoglobin: 13 g/dL (ref 13.0–17.0)
MCH: 30.4 pg (ref 26.0–34.0)
MCHC: 33.7 g/dL (ref 30.0–36.0)
MCV: 90.4 fL (ref 78.0–100.0)
Platelets: 143 10*3/uL — ABNORMAL LOW (ref 150–400)
RBC: 4.27 MIL/uL (ref 4.22–5.81)
RDW: 12.4 % (ref 11.5–15.5)
WBC: 8.9 10*3/uL (ref 4.0–10.5)

## 2017-09-27 LAB — URINALYSIS, ROUTINE W REFLEX MICROSCOPIC
Bilirubin Urine: NEGATIVE
Glucose, UA: NEGATIVE mg/dL
Ketones, ur: NEGATIVE mg/dL
Leukocytes, UA: NEGATIVE
Nitrite: NEGATIVE
Protein, ur: NEGATIVE mg/dL
Specific Gravity, Urine: 1.025 (ref 1.005–1.030)
Squamous Epithelial / LPF: NONE SEEN
WBC, UA: NONE SEEN WBC/hpf (ref 0–5)
pH: 5 (ref 5.0–8.0)

## 2017-09-27 LAB — PULMONARY FUNCTION TEST
DL/VA % pred: 83 %
DL/VA: 3.8 ml/min/mmHg/L
DLCO cor % pred: 66 %
DLCO cor: 20.5 ml/min/mmHg
DLCO unc % pred: 63 %
DLCO unc: 19.77 ml/min/mmHg
FEF 25-75 Post: 3.23 L/sec
FEF 25-75 Pre: 2.7 L/sec
FEF2575-%Change-Post: 19 %
FEF2575-%Pred-Post: 125 %
FEF2575-%Pred-Pre: 104 %
FEV1-%Change-Post: 3 %
FEV1-%Pred-Post: 103 %
FEV1-%Pred-Pre: 99 %
FEV1-Post: 2.97 L
FEV1-Pre: 2.87 L
FEV1FVC-%Change-Post: 0 %
FEV1FVC-%Pred-Pre: 104 %
FEV6-%Change-Post: 0 %
FEV6-%Pred-Post: 99 %
FEV6-%Pred-Pre: 99 %
FEV6-Post: 3.58 L
FEV6-Pre: 3.57 L
FEV6FVC-%Change-Post: -2 %
FEV6FVC-%Pred-Post: 101 %
FEV6FVC-%Pred-Pre: 104 %
FVC-%Change-Post: 2 %
FVC-%Pred-Post: 97 %
FVC-%Pred-Pre: 95 %
FVC-Post: 3.66 L
FVC-Pre: 3.57 L
Post FEV1/FVC ratio: 81 %
Post FEV6/FVC ratio: 98 %
Pre FEV1/FVC ratio: 80 %
Pre FEV6/FVC Ratio: 100 %
RV % pred: 85 %
RV: 1.97 L
TLC % pred: 83 %
TLC: 5.71 L

## 2017-09-27 LAB — COMPREHENSIVE METABOLIC PANEL
ALT: 24 U/L (ref 17–63)
AST: 20 U/L (ref 15–41)
Albumin: 3.7 g/dL (ref 3.5–5.0)
Alkaline Phosphatase: 74 U/L (ref 38–126)
Anion gap: 7 (ref 5–15)
BUN: 10 mg/dL (ref 6–20)
CO2: 18 mmol/L — ABNORMAL LOW (ref 22–32)
Calcium: 9.2 mg/dL (ref 8.9–10.3)
Chloride: 113 mmol/L — ABNORMAL HIGH (ref 101–111)
Creatinine, Ser: 0.94 mg/dL (ref 0.61–1.24)
GFR calc Af Amer: >60 mL/min (ref >60)
GFR calc non Af Amer: >60 mL/min (ref >60)
Glucose, Bld: 116 mg/dL — ABNORMAL HIGH (ref 65–99)
Potassium: 3.6 mmol/L (ref 3.5–5.1)
Sodium: 138 mmol/L (ref 135–145)
Total Bilirubin: 1.3 mg/dL — ABNORMAL HIGH (ref 0.3–1.2)
Total Protein: 6.5 g/dL (ref 6.5–8.1)

## 2017-09-27 LAB — HEMOGLOBIN A1C
Hgb A1c MFr Bld: 5.1 % (ref 4.8–5.6)
Mean Plasma Glucose: 99.67 mg/dL

## 2017-09-27 LAB — GLUCOSE, CAPILLARY: Glucose-Capillary: 114 mg/dL — ABNORMAL HIGH (ref 65–99)

## 2017-09-27 LAB — SURGICAL PCR SCREEN
MRSA, PCR: NEGATIVE
Staphylococcus aureus: NEGATIVE

## 2017-09-27 LAB — APTT: aPTT: 33 seconds (ref 24–36)

## 2017-09-27 LAB — PROTIME-INR
INR: 1.06
Prothrombin Time: 13.7 seconds (ref 11.4–15.2)

## 2017-09-27 LAB — ABO/RH: ABO/RH(D): B POS

## 2017-09-27 MED ORDER — ALBUTEROL SULFATE (2.5 MG/3ML) 0.083% IN NEBU
2.5000 mg | INHALATION_SOLUTION | Freq: Once | RESPIRATORY_TRACT | Status: AC
Start: 2017-09-27 — End: 2017-09-27
  Administered 2017-09-27: 2.5 mg via RESPIRATORY_TRACT

## 2017-09-27 NOTE — Progress Notes (Signed)
PCP - [facility]yster Cardiologist - Jay ganji  Chest x-ray - 09/27/17 EKG - 09/27/17 Stress Test - a year ago requesting from Dr. Jacinto Halim ECHO - 08/02/17 Cardiac Cath -08/02/17   Sleep Study - denies   Fasting Blood Sugar - 110-120s Checks Blood Sugar __0___ times a day Patient does not check his sugar on a regular basis, when he does check it is usually in the lower range   Patient cannot read or write patietn's wife reads all information for him    Patient denies shortness of breath, fever, cough and chest pain at PAT appointment   Patient verbalized understanding of instructions that were given to them at the PAT appointment. Patient was also instructed that they will need to review over the PAT instructions again at home before surgery.

## 2017-09-27 NOTE — Progress Notes (Signed)
Pre-op Cardiac Surgery  Carotid Findings:  Bilateral:  1-39% ICA stenosis.  Vertebral artery flow is antegrade.     Upper Extremity Right Left  Brachial Pressures 121 Triphasic 121 Triphasic  Radial Waveforms Triphasic Triphasic  Ulnar Waveforms Triphasic Triphasic  Palmar Arch (Allen's Test) Normal Normal   Findings:  Doppler waveforms remained normal bilaterally with both radial and ulnar compressions  [employer], RVS     09/27/2017 10:15 am

## 2017-09-28 MED ORDER — DEXTROSE 5 % IV SOLN
750.0000 mg | INTRAVENOUS | Status: DC
  Filled 2017-09-28 (×2): qty 750

## 2017-09-28 MED ORDER — CHLORHEXIDINE GLUCONATE 0.12 % MT SOLN
15.0000 mL | Freq: Once | OROMUCOSAL | Status: AC
  Administered 2017-09-29: 15 mL via OROMUCOSAL
  Filled 2017-09-28: qty 15

## 2017-09-28 MED ORDER — METOPROLOL TARTRATE 12.5 MG HALF TABLET: 12.5000 mg | ORAL_TABLET | Freq: Once | ORAL | Status: DC

## 2017-09-28 MED ORDER — TRANEXAMIC ACID (OHS) BOLUS VIA INFUSION
15.0000 mg/kg | INTRAVENOUS | Status: AC
  Administered 2017-09-29: 1299 mg via INTRAVENOUS
  Filled 2017-09-28: qty 1299

## 2017-09-28 MED ORDER — TRANEXAMIC ACID (OHS) PUMP PRIME SOLUTION
2.0000 mg/kg | INTRAVENOUS | Status: DC
  Filled 2017-09-28 (×2): qty 1.73

## 2017-09-28 MED ORDER — POTASSIUM CHLORIDE 2 MEQ/ML IV SOLN
80.0000 meq | INTRAVENOUS | Status: DC
  Filled 2017-09-28 (×2): qty 40

## 2017-09-28 MED ORDER — SODIUM CHLORIDE 0.9 % IV SOLN
1.5000 mg/kg/h | INTRAVENOUS | Status: AC
  Administered 2017-09-29: 1.5 mg/kg/h via INTRAVENOUS
  Filled 2017-09-28 (×2): qty 25

## 2017-09-28 MED ORDER — SODIUM CHLORIDE 0.9 % IV SOLN
INTRAVENOUS | Status: DC
  Filled 2017-09-28 (×2): qty 30

## 2017-09-28 MED ORDER — NITROGLYCERIN IN D5W 200-5 MCG/ML-% IV SOLN
2.0000 ug/min | INTRAVENOUS | Status: AC
  Administered 2017-09-29: 5 ug/min via INTRAVENOUS
  Filled 2017-09-28 (×2): qty 250

## 2017-09-28 MED ORDER — SODIUM CHLORIDE 0.9 % IV SOLN
INTRAVENOUS | Status: DC
  Filled 2017-09-28 (×2): qty 1

## 2017-09-28 MED ORDER — DEXTROSE 5 % IV SOLN
1.5000 g | INTRAVENOUS | Status: AC
  Administered 2017-09-29: 1.5 g via INTRAVENOUS
  Administered 2017-09-29: .75 g via INTRAVENOUS
  Filled 2017-09-28 (×2): qty 1.5

## 2017-09-28 MED ORDER — MAGNESIUM SULFATE 50 % IJ SOLN
40.0000 meq | INTRAMUSCULAR | Status: DC
  Filled 2017-09-28 (×2): qty 10

## 2017-09-28 MED ORDER — DOPAMINE-DEXTROSE 3.2-5 MG/ML-% IV SOLN
0.0000 ug/kg/min | INTRAVENOUS | Status: AC
  Administered 2017-09-29: 3 ug/kg/min via INTRAVENOUS
  Filled 2017-09-28 (×2): qty 250

## 2017-09-28 MED ORDER — DEXTROSE 5 % IV SOLN
0.0000 ug/min | INTRAVENOUS | Status: DC
  Filled 2017-09-28 (×2): qty 4

## 2017-09-28 MED ORDER — DEXMEDETOMIDINE HCL IN NACL 400 MCG/100ML IV SOLN
0.1000 ug/kg/h | INTRAVENOUS | Status: AC
  Administered 2017-09-29: .5 ug/kg/h via INTRAVENOUS
  Administered 2017-09-29: .3 ug/kg/h via INTRAVENOUS
  Filled 2017-09-28 (×2): qty 100

## 2017-09-28 MED ORDER — SODIUM CHLORIDE 0.9 % IV SOLN
30.0000 ug/min | INTRAVENOUS | Status: DC
  Filled 2017-09-28 (×2): qty 2

## 2017-09-28 MED ORDER — PLASMA-LYTE 148 IV SOLN
INTRAVENOUS | Status: AC
  Administered 2017-09-29: 500 mL
  Filled 2017-09-28 (×2): qty 2.5

## 2017-09-28 MED ORDER — VANCOMYCIN HCL 10 G IV SOLR
1500.0000 mg | INTRAVENOUS | Status: DC
  Filled 2017-09-28 (×2): qty 1500

## 2017-09-28 NOTE — Progress Notes (Signed)
Anesthesia Chart Review:  Pt is a 66 year old male scheduled for aortic valve replacement on 09/29/2017 with Kerin Perna, MD  PMH includes:  CAD (mild by 06/2017 cath), aortic stenosis, OVD, HTN, DM, hyperlipidemia, GERD. Former smoker. BMI 28  Medications include: Amlodipine, ASA 81 mg, losartan, lovastatin, metformin, metoprolol, Prilosec  BP 133/82   Pulse 66   Temp 36.5 C   Resp 18   Ht  (1.753 m)   Wt 190 lb 14.4 oz (86.6 kg)   SpO2 100%   BMI 28.19 kg/m   Preoperative labs reviewed.  HbA1c 5.1, glucose 116  CXR 09/27/17:  1. No pneumonia or effusion.  Question bronchitis. 2. Small hiatal hernia.  EKG 09/27/17: NSR. LAD. LVH with QRS widening. T wave abnormality, consider lateral ischemia  Carotid duplex 09/27/17:  - Final report not yet available. Preliminary report shows 1-39% B ICA stenosis.   Right and left cardiac cath 07/12/17:  - Normal to mild elevation in pulmonary artery pressure, normal LVEDP, PA pressure 35/12, mean 23 mmHg.  - Mild noncritical coronary artery disease, ostial ramus 30% calcific stenosis, otherwise 10-20% luminal irregularity in all 3 vessels. Mild disease in the RCA.  - Aortic valve could not be crossed. Heavily calcified, mild aortic root dilatation and AV appears to be bicuspid.  Echo 07/06/17:  1. LV cavity normal in size. Moderate concentric LVH. Normal global wall motion. Doppler evidence of grade II diastolic dysfunction, elevated LAP.Calculated EF 59%. 2. Mild calcification of the aortic valve annulus. Moderate aortic leaflet calcification. Severely restricted aortic valve leaflets. Severe aortic valve stenosis. Aortic valve peak pressure gradient of 76 and mean gradient of 47 mmHg, calculated aortic valve area 0.65 cm 3. Mild mitral regurgitation. 4. Mild to moderate tricuspid regurgitation. Moderate pulmonary hypertension. Pulmonary artery systolic pressure estimated at 46 mmHg 5. IVC is dilated with poor inspiration collapse  consistent with elevated RA pressure.  If no changes, I anticipate pt can proceed with surgery as scheduled.   Rica Mast, FNP-BC [facility]/Anesthesiology Phone: [phone] 09/28/2017 3:05 PM

## 2017-09-28 NOTE — Anesthesia Preprocedure Evaluation (Addendum)
Anesthesia Evaluation  Patient identified by MRN, date of birth, ID band Patient awake    Reviewed: Allergy & Precautions, NPO status , Patient's Chart, lab work & pertinent test results, reviewed documented beta blocker date and time   Airway Mallampati: I  TM Distance: >3 FB Neck ROM: Full    Dental  (+) Dental Advisory Given   Pulmonary shortness of breath and with exertion, former smoker,    Pulmonary exam normal breath sounds clear to auscultation       Cardiovascular hypertension, + angina with exertion + CAD and + Peripheral Vascular Disease  Normal cardiovascular exam+ Valvular Problems/Murmurs AS  Rhythm:Regular Rate:Normal  TTE 2018 - Mod conc LVH, grade 2 diastolic dysfx, EF ~60%, Severe AS with peak and mean gradients of  and with calc AVA 0.65cm^2.  Mild MR, mild-mod TR, mod pulm HTN (PASP ),    Neuro/Psych negative neurological ROS  negative psych ROS   GI/Hepatic Neg liver ROS, GERD  Controlled,  Endo/Other  negative endocrine ROSdiabetes, Type 2  Renal/GU negative Renal ROS  negative genitourinary   Musculoskeletal  (+) Arthritis ,   Abdominal   Peds  Hematology negative hematology ROS (+)   Anesthesia Other Findings   Reproductive/Obstetrics                            Anesthesia Physical Anesthesia Plan  ASA: IV  Anesthesia Plan: General   Post-op Pain Management:    Induction: Intravenous  PONV Risk Score and Plan: 3 and Treatment may vary due to age or medical condition and Midazolam  Airway Management Planned: Oral ETT  Additional Equipment: Arterial line, CVP, PA Cath, TEE and Ultrasound Guidance Line Placement  Intra-op Plan:   Post-operative Plan: Post-operative intubation/ventilation  Informed Consent: I have reviewed the patients History and Physical, chart, labs and discussed the procedure including the risks, benefits and  alternatives for the proposed anesthesia with the patient or authorized representative who has indicated his/her understanding and acceptance.   Dental advisory given  Plan Discussed with: CRNA  Anesthesia Plan Comments:         Anesthesia Quick Evaluation

## 2017-09-29 ENCOUNTER — Inpatient Hospital Stay (HOSPITAL_COMMUNITY): Payer: Medicare HMO

## 2017-09-29 ENCOUNTER — Inpatient Hospital Stay (HOSPITAL_COMMUNITY)
Admission: RE | Admit: 2017-09-29 | Discharge: 2017-10-06 | DRG: 220 | Disposition: A | Discharge status: Home / Self Care | Payer: Medicare HMO | Source: Ambulatory Visit | Attending: Cardiothoracic Surgery | Admitting: Cardiothoracic Surgery

## 2017-09-29 ENCOUNTER — Inpatient Hospital Stay (HOSPITAL_COMMUNITY): Payer: Medicare HMO | Admitting: Emergency Medicine

## 2017-09-29 ENCOUNTER — Encounter (HOSPITAL_COMMUNITY)
Admission: RE | Disposition: A | Discharge status: Home / Self Care | Payer: Self-pay | Source: Ambulatory Visit | Attending: Cardiothoracic Surgery

## 2017-09-29 ENCOUNTER — Inpatient Hospital Stay (HOSPITAL_COMMUNITY): Payer: Medicare HMO | Admitting: Anesthesiology

## 2017-09-29 ENCOUNTER — Encounter (HOSPITAL_COMMUNITY): Payer: Self-pay | Admitting: Urology

## 2017-09-29 DIAGNOSIS — Z950 Presence of cardiac pacemaker: Secondary | ICD-10-CM

## 2017-09-29 DIAGNOSIS — I10 Essential (primary) hypertension: Secondary | ICD-10-CM | POA: Diagnosis not present

## 2017-09-29 DIAGNOSIS — I442 Atrioventricular block, complete: Secondary | ICD-10-CM | POA: Diagnosis not present

## 2017-09-29 DIAGNOSIS — J90 Pleural effusion, not elsewhere classified: Secondary | ICD-10-CM | POA: Diagnosis present

## 2017-09-29 DIAGNOSIS — I081 Rheumatic disorders of both mitral and tricuspid valves: Secondary | ICD-10-CM | POA: Diagnosis not present

## 2017-09-29 DIAGNOSIS — I272 Pulmonary hypertension, unspecified: Secondary | ICD-10-CM | POA: Diagnosis not present

## 2017-09-29 DIAGNOSIS — I350 Nonrheumatic aortic (valve) stenosis: Secondary | ICD-10-CM

## 2017-09-29 DIAGNOSIS — R05 Cough: Secondary | ICD-10-CM | POA: Diagnosis not present

## 2017-09-29 DIAGNOSIS — M1990 Unspecified osteoarthritis, unspecified site: Secondary | ICD-10-CM | POA: Diagnosis not present

## 2017-09-29 DIAGNOSIS — K219 Gastro-esophageal reflux disease without esophagitis: Secondary | ICD-10-CM | POA: Diagnosis present

## 2017-09-29 DIAGNOSIS — Z87891 Personal history of nicotine dependence: Secondary | ICD-10-CM

## 2017-09-29 DIAGNOSIS — Z452 Encounter for adjustment and management of vascular access device: Secondary | ICD-10-CM | POA: Diagnosis not present

## 2017-09-29 DIAGNOSIS — Z951 Presence of aortocoronary bypass graft: Secondary | ICD-10-CM

## 2017-09-29 DIAGNOSIS — I25119 Atherosclerotic heart disease of native coronary artery with unspecified angina pectoris: Secondary | ICD-10-CM | POA: Diagnosis present

## 2017-09-29 DIAGNOSIS — J9811 Atelectasis: Secondary | ICD-10-CM | POA: Diagnosis not present

## 2017-09-29 DIAGNOSIS — E118 Type 2 diabetes mellitus with unspecified complications: Secondary | ICD-10-CM

## 2017-09-29 DIAGNOSIS — E1151 Type 2 diabetes mellitus with diabetic peripheral angiopathy without gangrene: Secondary | ICD-10-CM | POA: Diagnosis not present

## 2017-09-29 DIAGNOSIS — Q677 Pectus carinatum: Secondary | ICD-10-CM | POA: Diagnosis not present

## 2017-09-29 DIAGNOSIS — Z79899 Other long term (current) drug therapy: Secondary | ICD-10-CM

## 2017-09-29 DIAGNOSIS — I359 Nonrheumatic aortic valve disorder, unspecified: Secondary | ICD-10-CM | POA: Diagnosis not present

## 2017-09-29 DIAGNOSIS — Z952 Presence of prosthetic heart valve: Secondary | ICD-10-CM

## 2017-09-29 DIAGNOSIS — I358 Other nonrheumatic aortic valve disorders: Secondary | ICD-10-CM | POA: Diagnosis not present

## 2017-09-29 DIAGNOSIS — I959 Hypotension, unspecified: Secondary | ICD-10-CM | POA: Diagnosis not present

## 2017-09-29 DIAGNOSIS — I517 Cardiomegaly: Secondary | ICD-10-CM | POA: Diagnosis not present

## 2017-09-29 DIAGNOSIS — I2510 Atherosclerotic heart disease of native coronary artery without angina pectoris: Secondary | ICD-10-CM | POA: Diagnosis present

## 2017-09-29 HISTORY — PX: TEE WITHOUT CARDIOVERSION: SHX5443

## 2017-09-29 HISTORY — PX: AORTIC VALVE REPLACEMENT: SHX41

## 2017-09-29 LAB — POCT I-STAT, CHEM 8
BUN: 15 mg/dL (ref 6–20)
BUN: 14 mg/dL (ref 6–20)
BUN: 13 mg/dL (ref 6–20)
BUN: 13 mg/dL (ref 6–20)
BUN: 13 mg/dL (ref 6–20)
BUN: 13 mg/dL (ref 6–20)
BUN: 10 mg/dL (ref 6–20)
Calcium, Ion: 1.3 mmol/L (ref 1.15–1.40)
Calcium, Ion: 1.29 mmol/L (ref 1.15–1.40)
Calcium, Ion: 1.05 mmol/L — ABNORMAL LOW (ref 1.15–1.40)
Calcium, Ion: 1.19 mmol/L (ref 1.15–1.40)
Calcium, Ion: 1.18 mmol/L (ref 1.15–1.40)
Calcium, Ion: 1.09 mmol/L — ABNORMAL LOW (ref 1.15–1.40)
Calcium, Ion: 1.2 mmol/L (ref 1.15–1.40)
Chloride: 111 mmol/L (ref 101–111)
Chloride: 110 mmol/L (ref 101–111)
Chloride: 107 mmol/L (ref 101–111)
Chloride: 109 mmol/L (ref 101–111)
Chloride: 109 mmol/L (ref 101–111)
Chloride: 108 mmol/L (ref 101–111)
Chloride: 108 mmol/L (ref 101–111)
Creatinine, Ser: 0.8 mg/dL (ref 0.61–1.24)
Creatinine, Ser: 0.9 mg/dL (ref 0.61–1.24)
Creatinine, Ser: 0.7 mg/dL (ref 0.61–1.24)
Creatinine, Ser: 0.7 mg/dL (ref 0.61–1.24)
Creatinine, Ser: 0.6 mg/dL — ABNORMAL LOW (ref 0.61–1.24)
Creatinine, Ser: 0.8 mg/dL (ref 0.61–1.24)
Creatinine, Ser: 0.8 mg/dL (ref 0.61–1.24)
Glucose, Bld: 126 mg/dL — ABNORMAL HIGH (ref 65–99)
Glucose, Bld: 123 mg/dL — ABNORMAL HIGH (ref 65–99)
Glucose, Bld: 107 mg/dL — ABNORMAL HIGH (ref 65–99)
Glucose, Bld: 137 mg/dL — ABNORMAL HIGH (ref 65–99)
Glucose, Bld: 145 mg/dL — ABNORMAL HIGH (ref 65–99)
Glucose, Bld: 188 mg/dL — ABNORMAL HIGH (ref 65–99)
Glucose, Bld: 132 mg/dL — ABNORMAL HIGH (ref 65–99)
HCT: 30 % — ABNORMAL LOW (ref 39.0–52.0)
HCT: 29 % — ABNORMAL LOW (ref 39.0–52.0)
HCT: 25 % — ABNORMAL LOW (ref 39.0–52.0)
HCT: 26 % — ABNORMAL LOW (ref 39.0–52.0)
HCT: 26 % — ABNORMAL LOW (ref 39.0–52.0)
HCT: 26 % — ABNORMAL LOW (ref 39.0–52.0)
HCT: 30 % — ABNORMAL LOW (ref 39.0–52.0)
Hemoglobin: 10.2 g/dL — ABNORMAL LOW (ref 13.0–17.0)
Hemoglobin: 9.9 g/dL — ABNORMAL LOW (ref 13.0–17.0)
Hemoglobin: 8.5 g/dL — ABNORMAL LOW (ref 13.0–17.0)
Hemoglobin: 8.8 g/dL — ABNORMAL LOW (ref 13.0–17.0)
Hemoglobin: 8.8 g/dL — ABNORMAL LOW (ref 13.0–17.0)
Hemoglobin: 8.8 g/dL — ABNORMAL LOW (ref 13.0–17.0)
Hemoglobin: 10.2 g/dL — ABNORMAL LOW (ref 13.0–17.0)
Potassium: 3.8 mmol/L (ref 3.5–5.1)
Potassium: 3.9 mmol/L (ref 3.5–5.1)
Potassium: 4 mmol/L (ref 3.5–5.1)
Potassium: 4.6 mmol/L (ref 3.5–5.1)
Potassium: 4.8 mmol/L (ref 3.5–5.1)
Potassium: 4 mmol/L (ref 3.5–5.1)
Potassium: 4 mmol/L (ref 3.5–5.1)
Sodium: 143 mmol/L (ref 135–145)
Sodium: 142 mmol/L (ref 135–145)
Sodium: 143 mmol/L (ref 135–145)
Sodium: 142 mmol/L (ref 135–145)
Sodium: 143 mmol/L (ref 135–145)
Sodium: 143 mmol/L (ref 135–145)
Sodium: 143 mmol/L (ref 135–145)
TCO2: 23 mmol/L (ref 22–32)
TCO2: 21 mmol/L — ABNORMAL LOW (ref 22–32)
TCO2: 28 mmol/L (ref 22–32)
TCO2: 27 mmol/L (ref 22–32)
TCO2: 25 mmol/L (ref 22–32)
TCO2: 22 mmol/L (ref 22–32)
TCO2: 23 mmol/L (ref 22–32)

## 2017-09-29 LAB — POCT I-STAT 3, ART BLOOD GAS (G3+)
Acid-Base Excess: 1 mmol/L (ref 0.0–2.0)
Acid-base deficit: 4 mmol/L — ABNORMAL HIGH (ref 0.0–2.0)
Acid-base deficit: 4 mmol/L — ABNORMAL HIGH (ref 0.0–2.0)
Acid-base deficit: 6 mmol/L — ABNORMAL HIGH (ref 0.0–2.0)
Acid-base deficit: 7 mmol/L — ABNORMAL HIGH (ref 0.0–2.0)
Acid-base deficit: 5 mmol/L — ABNORMAL HIGH (ref 0.0–2.0)
Bicarbonate: 21.8 mmol/L (ref 20.0–28.0)
Bicarbonate: 25.9 mmol/L (ref 20.0–28.0)
Bicarbonate: 21.9 mmol/L (ref 20.0–28.0)
Bicarbonate: 20.1 mmol/L (ref 20.0–28.0)
Bicarbonate: 18.9 mmol/L — ABNORMAL LOW (ref 20.0–28.0)
Bicarbonate: 24.8 mmol/L (ref 20.0–28.0)
Bicarbonate: 19.6 mmol/L — ABNORMAL LOW (ref 20.0–28.0)
O2 Saturation: 100 %
O2 Saturation: 100 %
O2 Saturation: 98 %
O2 Saturation: 91 %
O2 Saturation: 90 %
O2 Saturation: 97 %
O2 Saturation: 96 %
Patient temperature: 36.5
Patient temperature: 36.6
Patient temperature: 37
Patient temperature: 37.8
TCO2: 23 mmol/L (ref 22–32)
TCO2: 27 mmol/L (ref 22–32)
TCO2: 23 mmol/L (ref 22–32)
TCO2: 21 mmol/L — ABNORMAL LOW (ref 22–32)
TCO2: 20 mmol/L — ABNORMAL LOW (ref 22–32)
TCO2: 26 mmol/L (ref 22–32)
TCO2: 21 mmol/L — ABNORMAL LOW (ref 22–32)
pCO2 arterial: 43.3 mmHg (ref 32.0–48.0)
pCO2 arterial: 41.1 mmHg (ref 32.0–48.0)
pCO2 arterial: 40.8 mmHg (ref 32.0–48.0)
pCO2 arterial: 39.9 mmHg (ref 32.0–48.0)
pCO2 arterial: 37.2 mmHg (ref 32.0–48.0)
pCO2 arterial: 39.3 mmHg (ref 32.0–48.0)
pCO2 arterial: 36.2 mmHg (ref 32.0–48.0)
pH, Arterial: 7.311 — ABNORMAL LOW (ref 7.350–7.450)
pH, Arterial: 7.408 (ref 7.350–7.450)
pH, Arterial: 7.338 — ABNORMAL LOW (ref 7.350–7.450)
pH, Arterial: 7.309 — ABNORMAL LOW (ref 7.350–7.450)
pH, Arterial: 7.312 — ABNORMAL LOW (ref 7.350–7.450)
pH, Arterial: 7.408 (ref 7.350–7.450)
pH, Arterial: 7.345 — ABNORMAL LOW (ref 7.350–7.450)
pO2, Arterial: 289 mmHg — ABNORMAL HIGH (ref 83.0–108.0)
pO2, Arterial: 495 mmHg — ABNORMAL HIGH (ref 83.0–108.0)
pO2, Arterial: 115 mmHg — ABNORMAL HIGH (ref 83.0–108.0)
pO2, Arterial: 65 mmHg — ABNORMAL LOW (ref 83.0–108.0)
pO2, Arterial: 63 mmHg — ABNORMAL LOW (ref 83.0–108.0)
pO2, Arterial: 85 mmHg (ref 83.0–108.0)
pO2, Arterial: 88 mmHg (ref 83.0–108.0)

## 2017-09-29 LAB — BLOOD GAS, ARTERIAL
Acid-base deficit: 3.3 mmol/L — ABNORMAL HIGH (ref 0.0–2.0)
Bicarbonate: 20.1 mmol/L (ref 20.0–28.0)
Drawn by: 421801
FIO2: 21
O2 Saturation: 98.4 %
Patient temperature: 98.6
pCO2 arterial: 30.1 mmHg — ABNORMAL LOW (ref 32.0–48.0)
pH, Arterial: 7.441 (ref 7.350–7.450)
pO2, Arterial: 109 mmHg — ABNORMAL HIGH (ref 83.0–108.0)

## 2017-09-29 LAB — CBC
HCT: 35 % — ABNORMAL LOW (ref 39.0–52.0)
HCT: 32.8 % — ABNORMAL LOW (ref 39.0–52.0)
Hemoglobin: 11.6 g/dL — ABNORMAL LOW (ref 13.0–17.0)
Hemoglobin: 10.9 g/dL — ABNORMAL LOW (ref 13.0–17.0)
MCH: 29.4 pg (ref 26.0–34.0)
MCH: 29.4 pg (ref 26.0–34.0)
MCHC: 33.1 g/dL (ref 30.0–36.0)
MCHC: 33.2 g/dL (ref 30.0–36.0)
MCV: 88.8 fL (ref 78.0–100.0)
MCV: 88.4 fL (ref 78.0–100.0)
PLATELETS: 80 10*3/uL — AB (ref 150–400)
Platelets: 72 10*3/uL — ABNORMAL LOW (ref 150–400)
RBC: 3.94 MIL/uL — AB (ref 4.22–5.81)
RBC: 3.71 MIL/uL — ABNORMAL LOW (ref 4.22–5.81)
RDW: 13 % (ref 11.5–15.5)
RDW: 13.1 % (ref 11.5–15.5)
WBC: 18.7 10*3/uL — AB (ref 4.0–10.5)
WBC: 11.5 10*3/uL — ABNORMAL HIGH (ref 4.0–10.5)

## 2017-09-29 LAB — APTT: APTT: 33 s (ref 24–36)

## 2017-09-29 LAB — CREATININE, SERUM
Creatinine, Ser: 0.89 mg/dL (ref 0.61–1.24)
GFR calc Af Amer: >60 mL/min (ref >60)
GFR calc non Af Amer: >60 mL/min (ref >60)

## 2017-09-29 LAB — GLUCOSE, CAPILLARY
Glucose-Capillary: 125 mg/dL — ABNORMAL HIGH (ref 65–99)
Glucose-Capillary: 110 mg/dL — ABNORMAL HIGH (ref 65–99)
Glucose-Capillary: 115 mg/dL — ABNORMAL HIGH (ref 65–99)
Glucose-Capillary: 146 mg/dL — ABNORMAL HIGH (ref 65–99)
Glucose-Capillary: 136 mg/dL — ABNORMAL HIGH (ref 65–99)
Glucose-Capillary: 145 mg/dL — ABNORMAL HIGH (ref 65–99)
Glucose-Capillary: 115 mg/dL — ABNORMAL HIGH (ref 65–99)
Glucose-Capillary: 112 mg/dL — ABNORMAL HIGH (ref 65–99)

## 2017-09-29 LAB — POCT I-STAT 4, (NA,K, GLUC, HGB,HCT)
Glucose, Bld: 142 mg/dL — ABNORMAL HIGH (ref 65–99)
HCT: 31 % — ABNORMAL LOW (ref 39.0–52.0)
Hemoglobin: 10.5 g/dL — ABNORMAL LOW (ref 13.0–17.0)
Potassium: 4.4 mmol/L (ref 3.5–5.1)
Sodium: 145 mmol/L (ref 135–145)

## 2017-09-29 LAB — PROTIME-INR
INR: 1.46
Prothrombin Time: 17.6 seconds — ABNORMAL HIGH (ref 11.4–15.2)

## 2017-09-29 LAB — PREPARE RBC (CROSSMATCH)

## 2017-09-29 LAB — MAGNESIUM: Magnesium: 3 mg/dL — ABNORMAL HIGH (ref 1.7–2.4)

## 2017-09-29 LAB — PLATELET COUNT: Platelets: 99 10*3/uL — ABNORMAL LOW (ref 150–400)

## 2017-09-29 LAB — HEMOGLOBIN AND HEMATOCRIT, BLOOD
HCT: 26.7 % — ABNORMAL LOW (ref 39.0–52.0)
Hemoglobin: 9.2 g/dL — ABNORMAL LOW (ref 13.0–17.0)

## 2017-09-29 SURGERY — REPLACEMENT, AORTIC VALVE, OPEN
Anesthesia: General | Site: Chest

## 2017-09-29 MED ORDER — SODIUM CHLORIDE 0.9 % IV SOLN
INTRAVENOUS | Status: DC
  Administered 2017-09-30: 3.1 [IU]/h via INTRAVENOUS
  Filled 2017-09-29: qty 1

## 2017-09-29 MED ORDER — NITROGLYCERIN IN D5W 200-5 MCG/ML-% IV SOLN
0.0000 ug/min | INTRAVENOUS | Status: DC
  Administered 2017-09-29: 20 ug/min via INTRAVENOUS

## 2017-09-29 MED ORDER — ASPIRIN 81 MG PO CHEW: 324.0000 mg | CHEWABLE_TABLET | Freq: Every day | ORAL | Status: DC

## 2017-09-29 MED ORDER — PANTOPRAZOLE SODIUM 40 MG PO TBEC
40.0000 mg | DELAYED_RELEASE_TABLET | Freq: Every day | ORAL | Status: DC
  Administered 2017-10-01 – 2017-10-06 (×6): 40 mg via ORAL
  Filled 2017-09-29 (×6): qty 1

## 2017-09-29 MED ORDER — MIDAZOLAM HCL 2 MG/2ML IJ SOLN
2.0000 mg | INTRAMUSCULAR | Status: DC | PRN
  Administered 2017-09-29: 2 mg via INTRAVENOUS
  Filled 2017-09-29: qty 2

## 2017-09-29 MED ORDER — SODIUM CHLORIDE 0.9 % IV SOLN
INTRAVENOUS | Status: DC | PRN
  Administered 2017-09-29: 1 [IU]/h via INTRAVENOUS

## 2017-09-29 MED ORDER — VANCOMYCIN HCL IN DEXTROSE 1-5 GM/200ML-% IV SOLN
1000.0000 mg | Freq: Two times a day (BID) | INTRAVENOUS | Status: AC
  Administered 2017-09-29 – 2017-10-01 (×4): 1000 mg via INTRAVENOUS
  Filled 2017-09-29 (×5): qty 200

## 2017-09-29 MED ORDER — MILRINONE LACTATE IN DEXTROSE 20-5 MG/100ML-% IV SOLN
0.1250 ug/kg/min | INTRAVENOUS | Status: AC
  Administered 2017-09-29: 0.25 ug/kg/min via INTRAVENOUS
  Filled 2017-09-29: qty 100

## 2017-09-29 MED ORDER — "THROMBI-PAD 3""X3"" EX PADS"
1.0000 | MEDICATED_PAD | Freq: Once | CUTANEOUS | Status: AC
  Administered 2017-09-29: 1 via TOPICAL
  Filled 2017-09-29: qty 1

## 2017-09-29 MED ORDER — SODIUM CHLORIDE 0.9% FLUSH
10.0000 mL | Freq: Two times a day (BID) | INTRAVENOUS | Status: DC
  Administered 2017-09-29 – 2017-09-30 (×3): 10 mL

## 2017-09-29 MED ORDER — FENTANYL CITRATE (PF) 250 MCG/5ML IJ SOLN
INTRAMUSCULAR | Status: AC
  Filled 2017-09-29: qty 5

## 2017-09-29 MED ORDER — FAMOTIDINE IN NACL 20-0.9 MG/50ML-% IV SOLN
20.0000 mg | Freq: Two times a day (BID) | INTRAVENOUS | Status: AC
  Administered 2017-09-29 (×2): 20 mg via INTRAVENOUS
  Filled 2017-09-29: qty 50

## 2017-09-29 MED ORDER — VANCOMYCIN HCL IN DEXTROSE 1-5 GM/200ML-% IV SOLN: 1000.0000 mg | Freq: Once | INTRAVENOUS | Status: DC

## 2017-09-29 MED ORDER — METOCLOPRAMIDE HCL 5 MG/ML IJ SOLN
10.0000 mg | Freq: Four times a day (QID) | INTRAMUSCULAR | Status: DC
  Administered 2017-09-29: 10 mg via INTRAVENOUS

## 2017-09-29 MED ORDER — LACTATED RINGERS IV SOLN: INTRAVENOUS | Status: DC

## 2017-09-29 MED ORDER — 0.9 % SODIUM CHLORIDE (POUR BTL) OPTIME
TOPICAL | Status: DC | PRN
  Administered 2017-09-29: 6000 mL

## 2017-09-29 MED ORDER — MORPHINE SULFATE (PF) 4 MG/ML IV SOLN
INTRAVENOUS | Status: AC
  Administered 2017-09-29: 4 mg
  Filled 2017-09-29: qty 1

## 2017-09-29 MED ORDER — PRAVASTATIN SODIUM 40 MG PO TABS
40.0000 mg | ORAL_TABLET | Freq: Every day | ORAL | Status: DC
  Administered 2017-09-30 – 2017-10-04 (×5): 40 mg via ORAL
  Filled 2017-09-29 (×6): qty 1

## 2017-09-29 MED ORDER — ONDANSETRON HCL 4 MG/2ML IJ SOLN: 4.0000 mg | Freq: Four times a day (QID) | INTRAMUSCULAR | Status: DC | PRN

## 2017-09-29 MED ORDER — FENTANYL CITRATE (PF) 250 MCG/5ML IJ SOLN
INTRAMUSCULAR | Status: DC | PRN
  Administered 2017-09-29: 250 ug via INTRAVENOUS
  Administered 2017-09-29: 50 ug via INTRAVENOUS
  Administered 2017-09-29: 500 ug via INTRAVENOUS
  Administered 2017-09-29 (×2): 100 ug via INTRAVENOUS
  Administered 2017-09-29: 250 ug via INTRAVENOUS

## 2017-09-29 MED ORDER — VANCOMYCIN HCL 1000 MG IV SOLR
INTRAVENOUS | Status: DC | PRN
  Administered 2017-09-29: 1500 mg via INTRAVENOUS

## 2017-09-29 MED ORDER — SODIUM CHLORIDE 0.9 % IV SOLN
0.0000 ug/kg/h | INTRAVENOUS | Status: DC
  Filled 2017-09-29: qty 2

## 2017-09-29 MED ORDER — BISACODYL 5 MG PO TBEC
10.0000 mg | DELAYED_RELEASE_TABLET | Freq: Every day | ORAL | Status: DC
  Administered 2017-09-30 – 2017-10-04 (×4): 10 mg via ORAL
  Filled 2017-09-29 (×5): qty 2

## 2017-09-29 MED ORDER — DOPAMINE-DEXTROSE 3.2-5 MG/ML-% IV SOLN: 0.0000 ug/kg/min | INTRAVENOUS | Status: DC

## 2017-09-29 MED ORDER — MIDAZOLAM HCL 5 MG/5ML IJ SOLN
INTRAMUSCULAR | Status: DC | PRN
  Administered 2017-09-29 (×2): 2 mg via INTRAVENOUS
  Administered 2017-09-29: 4 mg via INTRAVENOUS

## 2017-09-29 MED ORDER — DOCUSATE SODIUM 100 MG PO CAPS
200.0000 mg | ORAL_CAPSULE | Freq: Every day | ORAL | Status: DC
  Administered 2017-09-30 – 2017-10-04 (×4): 200 mg via ORAL
  Filled 2017-09-29 (×5): qty 2

## 2017-09-29 MED ORDER — CHLORHEXIDINE GLUCONATE 0.12% ORAL RINSE (MEDLINE KIT)
15.0000 mL | Freq: Two times a day (BID) | OROMUCOSAL | Status: DC
  Administered 2017-09-29: 15 mL via OROMUCOSAL

## 2017-09-29 MED ORDER — LACTATED RINGERS IV SOLN
500.0000 mL | Freq: Once | INTRAVENOUS | Status: AC | PRN
  Administered 2017-09-29: 500 mL via INTRAVENOUS

## 2017-09-29 MED ORDER — THROMBIN 5000 UNITS EX SOLR
CUTANEOUS | Status: AC
  Filled 2017-09-29: qty 5000

## 2017-09-29 MED ORDER — INSULIN REGULAR BOLUS VIA INFUSION
0.0000 [IU] | Freq: Three times a day (TID) | INTRAVENOUS | Status: DC
  Filled 2017-09-29: qty 10

## 2017-09-29 MED ORDER — PROPOFOL 10 MG/ML IV BOLUS
INTRAVENOUS | Status: DC | PRN
  Administered 2017-09-29: 60 mg via INTRAVENOUS

## 2017-09-29 MED ORDER — ACETAMINOPHEN 160 MG/5ML PO SOLN: 650.0000 mg | Freq: Once | ORAL | Status: AC

## 2017-09-29 MED ORDER — SODIUM CHLORIDE 0.9 % IJ SOLN
OROMUCOSAL | Status: DC | PRN
  Administered 2017-09-29 (×4): 4 mL via TOPICAL

## 2017-09-29 MED ORDER — SODIUM CHLORIDE 0.9 % IV SOLN: INTRAVENOUS | Status: DC

## 2017-09-29 MED ORDER — SODIUM CHLORIDE 0.9% FLUSH: 3.0000 mL | INTRAVENOUS | Status: DC | PRN

## 2017-09-29 MED ORDER — ACETAMINOPHEN 500 MG PO TABS
1000.0000 mg | ORAL_TABLET | Freq: Four times a day (QID) | ORAL | Status: AC
  Administered 2017-09-29 – 2017-10-04 (×18): 1000 mg via ORAL
  Filled 2017-09-29 (×18): qty 2

## 2017-09-29 MED ORDER — CHLORHEXIDINE GLUCONATE 4 % EX LIQD: 30.0000 mL | CUTANEOUS | Status: DC

## 2017-09-29 MED ORDER — MORPHINE SULFATE (PF) 2 MG/ML IV SOLN: 2.0000 mg | INTRAVENOUS | Status: DC | PRN

## 2017-09-29 MED ORDER — LACTATED RINGERS IV SOLN
INTRAVENOUS | Status: DC | PRN
  Administered 2017-09-29: 07:00:00 via INTRAVENOUS

## 2017-09-29 MED ORDER — ROCURONIUM BROMIDE 100 MG/10ML IV SOLN
INTRAVENOUS | Status: DC | PRN
  Administered 2017-09-29: 70 mg via INTRAVENOUS
  Administered 2017-09-29: 100 mg via INTRAVENOUS
  Administered 2017-09-29: 30 mg via INTRAVENOUS

## 2017-09-29 MED ORDER — ASPIRIN EC 325 MG PO TBEC
325.0000 mg | DELAYED_RELEASE_TABLET | Freq: Every day | ORAL | Status: DC
  Administered 2017-09-30 – 2017-10-06 (×7): 325 mg via ORAL
  Filled 2017-09-29 (×7): qty 1

## 2017-09-29 MED ORDER — MILRINONE LACTATE IN DEXTROSE 20-5 MG/100ML-% IV SOLN
0.1250 ug/kg/min | INTRAVENOUS | Status: DC
  Administered 2017-09-29: 0.25 ug/kg/min via INTRAVENOUS
  Administered 2017-09-30: 0.125 ug/kg/min via INTRAVENOUS
  Filled 2017-09-29 (×2): qty 100

## 2017-09-29 MED ORDER — PROPOFOL 10 MG/ML IV BOLUS
INTRAVENOUS | Status: AC
  Filled 2017-09-29: qty 20

## 2017-09-29 MED ORDER — ALBUMIN HUMAN 5 % IV SOLN
INTRAVENOUS | Status: DC | PRN
  Administered 2017-09-29: 12:00:00 via INTRAVENOUS

## 2017-09-29 MED ORDER — SODIUM CHLORIDE 0.9% FLUSH: 10.0000 mL | INTRAVENOUS | Status: DC | PRN

## 2017-09-29 MED ORDER — SODIUM CHLORIDE 0.45 % IV SOLN: INTRAVENOUS | Status: DC | PRN

## 2017-09-29 MED ORDER — PHENYLEPHRINE 40 MCG/ML (10ML) SYRINGE FOR IV PUSH (FOR BLOOD PRESSURE SUPPORT)
PREFILLED_SYRINGE | INTRAVENOUS | Status: AC
  Filled 2017-09-29: qty 10

## 2017-09-29 MED ORDER — DEXTROSE 5 % IV SOLN
1.5000 g | Freq: Two times a day (BID) | INTRAVENOUS | Status: AC
  Administered 2017-09-29 – 2017-10-01 (×4): 1.5 g via INTRAVENOUS
  Filled 2017-09-29 (×4): qty 1.5

## 2017-09-29 MED ORDER — MAGNESIUM SULFATE 4 GM/100ML IV SOLN
4.0000 g | Freq: Once | INTRAVENOUS | Status: AC
  Administered 2017-09-29: 4 g via INTRAVENOUS
  Filled 2017-09-29: qty 100

## 2017-09-29 MED ORDER — LACTATED RINGERS IV SOLN
INTRAVENOUS | Status: DC | PRN
  Administered 2017-09-29 (×2): via INTRAVENOUS

## 2017-09-29 MED ORDER — CHLORHEXIDINE GLUCONATE CLOTH 2 % EX PADS
6.0000 | MEDICATED_PAD | Freq: Every day | CUTANEOUS | Status: DC
  Administered 2017-09-29: 6 via TOPICAL

## 2017-09-29 MED ORDER — METOCLOPRAMIDE HCL 5 MG/ML IJ SOLN
10.0000 mg | Freq: Four times a day (QID) | INTRAMUSCULAR | Status: DC
  Administered 2017-09-29 – 2017-10-04 (×15): 10 mg via INTRAVENOUS
  Filled 2017-09-29 (×16): qty 2

## 2017-09-29 MED ORDER — PROTAMINE SULFATE 10 MG/ML IV SOLN
INTRAVENOUS | Status: DC | PRN
  Administered 2017-09-29: 300 mg via INTRAVENOUS

## 2017-09-29 MED ORDER — ROCURONIUM BROMIDE 10 MG/ML (PF) SYRINGE
PREFILLED_SYRINGE | INTRAVENOUS | Status: AC
  Filled 2017-09-29: qty 5

## 2017-09-29 MED ORDER — MORPHINE SULFATE (PF) 4 MG/ML IV SOLN
1.0000 mg | INTRAVENOUS | Status: AC | PRN
  Administered 2017-09-29 (×2): 4 mg via INTRAVENOUS
  Filled 2017-09-29 (×2): qty 1

## 2017-09-29 MED ORDER — ORAL CARE MOUTH RINSE
15.0000 mL | Freq: Four times a day (QID) | OROMUCOSAL | Status: DC
  Administered 2017-09-29: 15 mL via OROMUCOSAL

## 2017-09-29 MED ORDER — METOPROLOL TARTRATE 25 MG/10 ML ORAL SUSPENSION: 12.5000 mg | Freq: Two times a day (BID) | ORAL | Status: DC

## 2017-09-29 MED ORDER — ACETAMINOPHEN 650 MG RE SUPP
650.0000 mg | Freq: Once | RECTAL | Status: AC
  Administered 2017-09-29: 650 mg via RECTAL

## 2017-09-29 MED ORDER — METOPROLOL TARTRATE 12.5 MG HALF TABLET
12.5000 mg | ORAL_TABLET | Freq: Two times a day (BID) | ORAL | Status: DC
  Filled 2017-09-29: qty 1

## 2017-09-29 MED ORDER — PHENYLEPHRINE HCL 10 MG/ML IJ SOLN
INTRAMUSCULAR | Status: DC | PRN
  Administered 2017-09-29: 120 ug via INTRAVENOUS

## 2017-09-29 MED ORDER — TRAMADOL HCL 50 MG PO TABS
50.0000 mg | ORAL_TABLET | ORAL | Status: DC | PRN
  Administered 2017-10-01: 100 mg via ORAL
  Administered 2017-10-01: 50 mg via ORAL
  Administered 2017-10-01 – 2017-10-02 (×2): 100 mg via ORAL
  Administered 2017-10-03: 50 mg via ORAL
  Filled 2017-09-29: qty 2
  Filled 2017-09-29: qty 1
  Filled 2017-09-29: qty 2
  Filled 2017-09-29 (×2): qty 1
  Filled 2017-09-29: qty 2

## 2017-09-29 MED ORDER — CHLORHEXIDINE GLUCONATE CLOTH 2 % EX PADS: 6.0000 | MEDICATED_PAD | Freq: Every day | CUTANEOUS | Status: DC

## 2017-09-29 MED ORDER — POTASSIUM CHLORIDE 10 MEQ/50ML IV SOLN: 10.0000 meq | INTRAVENOUS | Status: AC

## 2017-09-29 MED ORDER — SODIUM CHLORIDE 0.9% FLUSH
3.0000 mL | Freq: Two times a day (BID) | INTRAVENOUS | Status: DC
  Administered 2017-09-30 – 2017-10-02 (×6): 3 mL via INTRAVENOUS

## 2017-09-29 MED ORDER — ALBUTEROL SULFATE (2.5 MG/3ML) 0.083% IN NEBU
2.5000 mg | INHALATION_SOLUTION | Freq: Four times a day (QID) | RESPIRATORY_TRACT | Status: DC
  Administered 2017-09-29 – 2017-10-02 (×11): 2.5 mg via RESPIRATORY_TRACT
  Filled 2017-09-29 (×11): qty 3

## 2017-09-29 MED ORDER — CALCIUM CHLORIDE 10 % IV SOLN
INTRAVENOUS | Status: DC | PRN
  Administered 2017-09-29: 200 mg via INTRAVENOUS

## 2017-09-29 MED ORDER — SODIUM CHLORIDE 0.9 % IV SOLN
0.0000 ug/min | INTRAVENOUS | Status: DC
  Filled 2017-09-29: qty 2

## 2017-09-29 MED ORDER — MORPHINE SULFATE (PF) 2 MG/ML IV SOLN: 1.0000 mg | INTRAVENOUS | Status: DC | PRN

## 2017-09-29 MED ORDER — OXYCODONE HCL 5 MG PO TABS
5.0000 mg | ORAL_TABLET | ORAL | Status: DC | PRN
  Administered 2017-09-30: 10 mg via ORAL
  Administered 2017-09-30: 5 mg via ORAL
  Administered 2017-10-01 (×2): 10 mg via ORAL
  Filled 2017-09-29 (×2): qty 2
  Filled 2017-09-29: qty 1
  Filled 2017-09-29: qty 2

## 2017-09-29 MED ORDER — BISACODYL 10 MG RE SUPP: 10.0000 mg | Freq: Every day | RECTAL | Status: DC

## 2017-09-29 MED ORDER — ACETAMINOPHEN 160 MG/5ML PO SOLN
1000.0000 mg | Freq: Four times a day (QID) | ORAL | Status: AC
  Filled 2017-09-29: qty 40.6

## 2017-09-29 MED ORDER — SODIUM CHLORIDE 0.9 % IV SOLN: 250.0000 mL | INTRAVENOUS | Status: DC

## 2017-09-29 MED ORDER — CHLORHEXIDINE GLUCONATE 0.12 % MT SOLN
15.0000 mL | OROMUCOSAL | Status: AC
  Administered 2017-09-29: 15 mL via OROMUCOSAL

## 2017-09-29 MED ORDER — HEMOSTATIC AGENTS (NO CHARGE) OPTIME
TOPICAL | Status: DC | PRN
  Administered 2017-09-29 (×2): 1 via TOPICAL

## 2017-09-29 MED ORDER — SODIUM BICARBONATE 8.4 % IV SOLN
50.0000 meq | Freq: Once | INTRAVENOUS | Status: AC
  Administered 2017-09-29: 50 meq via INTRAVENOUS

## 2017-09-29 MED ORDER — HEPARIN SODIUM (PORCINE) 1000 UNIT/ML IJ SOLN
INTRAMUSCULAR | Status: DC | PRN
  Administered 2017-09-29: 30000 [IU] via INTRAVENOUS

## 2017-09-29 MED ORDER — FENTANYL CITRATE (PF) 250 MCG/5ML IJ SOLN
INTRAMUSCULAR | Status: AC
  Filled 2017-09-29: qty 20

## 2017-09-29 MED ORDER — MIDAZOLAM HCL 10 MG/2ML IJ SOLN
INTRAMUSCULAR | Status: AC
  Filled 2017-09-29: qty 2

## 2017-09-29 MED ORDER — MORPHINE SULFATE (PF) 4 MG/ML IV SOLN
2.0000 mg | INTRAVENOUS | Status: DC | PRN
  Administered 2017-09-30 (×3): 4 mg via INTRAVENOUS
  Filled 2017-09-29 (×3): qty 1

## 2017-09-29 MED ORDER — ALBUMIN HUMAN 5 % IV SOLN
250.0000 mL | INTRAVENOUS | Status: AC | PRN
  Administered 2017-09-29 (×3): 250 mL via INTRAVENOUS
  Filled 2017-09-29: qty 250

## 2017-09-29 MED ORDER — CALCIUM CHLORIDE 10 % IV SOLN
INTRAVENOUS | Status: AC
  Filled 2017-09-29: qty 10

## 2017-09-29 MED ORDER — PHENYLEPHRINE HCL 10 MG/ML IJ SOLN
INTRAVENOUS | Status: DC | PRN
  Administered 2017-09-29: 50 ug/min via INTRAVENOUS

## 2017-09-29 MED ORDER — METOPROLOL TARTRATE 5 MG/5ML IV SOLN: 2.5000 mg | INTRAVENOUS | Status: DC | PRN

## 2017-09-29 SURGICAL SUPPLY — 95 items
ADAPTER CARDIO PERF ANTE/RETRO (ADAPTER) ×4 IMPLANT
BAG DECANTER FOR FLEXI CONT (MISCELLANEOUS) ×4 IMPLANT
BLADE STERNUM SYSTEM 6 (BLADE) ×4 IMPLANT
BLADE SURG 12 STRL SS (BLADE) ×4 IMPLANT
BLADE SURG 15 STRL LF DISP TIS (BLADE) ×2 IMPLANT
BLADE SURG 15 STRL SS (BLADE) ×2
CANISTER SUCT 3000ML PPV (MISCELLANEOUS) ×4 IMPLANT
CANNULA GUNDRY RCSP 15FR (MISCELLANEOUS) ×4 IMPLANT
CATH CPB KIT VANTRIGT (MISCELLANEOUS) ×4 IMPLANT
CATH HEART VENT LEFT (CATHETERS) ×4 IMPLANT
CATH RETROPLEGIA CORONARY 14FR (CATHETERS) IMPLANT
CATH ROBINSON RED A/P 18FR (CATHETERS) ×20 IMPLANT
CATH THORACIC 36FR RT ANG (CATHETERS) ×4 IMPLANT
CLIP FOGARTY SPRING 6M (CLIP) IMPLANT
CLIP VESOCCLUDE MED 24/CT (CLIP) ×4 IMPLANT
CLIP VESOCCLUDE SM WIDE 24/CT (CLIP) ×8 IMPLANT
CONT SPEC 4OZ CLIKSEAL STRL BL (MISCELLANEOUS) ×4 IMPLANT
COVER SURGICAL LIGHT HANDLE (MISCELLANEOUS) ×8 IMPLANT
CRADLE DONUT ADULT HEAD (MISCELLANEOUS) ×4 IMPLANT
DRAIN CHANNEL 32F RND 10.7 FF (WOUND CARE) ×4 IMPLANT
DRAPE CARDIOVASCULAR INCISE (DRAPES) ×2
DRAPE SLUSH/WARMER DISC (DRAPES) ×4 IMPLANT
DRAPE SRG 135X102X78XABS (DRAPES) ×2 IMPLANT
DRSG AQUACEL AG ADV 3.5X14 (GAUZE/BANDAGES/DRESSINGS) ×4 IMPLANT
ELECT BLADE 4.0 EZ CLEAN MEGAD (MISCELLANEOUS) ×4
ELECT BLADE 6.5 EXT (BLADE) ×4 IMPLANT
ELECT CAUTERY BLADE 6.4 (BLADE) ×4 IMPLANT
ELECT REM PT RETURN 9FT ADLT (ELECTROSURGICAL) ×8
ELECTRODE BLDE 4.0 EZ CLN MEGD (MISCELLANEOUS) ×2 IMPLANT
ELECTRODE REM PT RTRN 9FT ADLT (ELECTROSURGICAL) ×4 IMPLANT
FELT TEFLON 1X6 (MISCELLANEOUS) ×8 IMPLANT
GAUZE SPONGE 4X4 12PLY STRL (GAUZE/BANDAGES/DRESSINGS) ×4 IMPLANT
GLOVE BIO SURGEON STRL SZ 6.5 (GLOVE) ×18 IMPLANT
GLOVE BIO SURGEON STRL SZ7.5 (GLOVE) ×8 IMPLANT
GLOVE BIO SURGEONS STRL SZ 6.5 (GLOVE) ×6
GOWN STRL REUS W/ TWL LRG LVL3 (GOWN DISPOSABLE) ×16 IMPLANT
GOWN STRL REUS W/TWL LRG LVL3 (GOWN DISPOSABLE) ×16
HEMOSTAT POWDER SURGIFOAM 1G (HEMOSTASIS) ×16 IMPLANT
HEMOSTAT SURGICEL 2X14 (HEMOSTASIS) ×4 IMPLANT
INSERT FOGARTY XLG (MISCELLANEOUS) IMPLANT
IV CATH 18G X1.75 CATHLON (IV SOLUTION) ×4 IMPLANT
KIT BASIN OR (CUSTOM PROCEDURE TRAY) ×4 IMPLANT
KIT ROOM TURNOVER OR (KITS) ×4 IMPLANT
KIT SUCTION CATH 14FR (SUCTIONS) ×4 IMPLANT
LEAD PACING MYOCARDI (MISCELLANEOUS) ×4 IMPLANT
LINE VENT (MISCELLANEOUS) ×4 IMPLANT
NEEDLE AORTIC AIR ASPIRATING (NEEDLE) ×4 IMPLANT
NS IRRIG 1000ML POUR BTL (IV SOLUTION) ×24 IMPLANT
PACK OPEN HEART (CUSTOM PROCEDURE TRAY) ×4 IMPLANT
PAD ARMBOARD 7.5X6 YLW CONV (MISCELLANEOUS) ×8 IMPLANT
SENSOR MYOCARDIAL TEMP (MISCELLANEOUS) ×4 IMPLANT
SET CARDIOPLEGIA MPS 5001102 (MISCELLANEOUS) ×4 IMPLANT
SURGIFLO W/THROMBIN 8M KIT (HEMOSTASIS) ×4 IMPLANT
SUT BONE WAX W31G (SUTURE) ×4 IMPLANT
SUT ETHIBON 2 0 V 52N 30 (SUTURE) ×16 IMPLANT
SUT ETHIBOND 2 0 SH (SUTURE) ×10
SUT ETHIBOND 2 0 SH 36X2 (SUTURE) ×10 IMPLANT
SUT PROLENE 3 0 RB 1 (SUTURE) ×4 IMPLANT
SUT PROLENE 3 0 SH 1 (SUTURE) IMPLANT
SUT PROLENE 3 0 SH DA (SUTURE) IMPLANT
SUT PROLENE 4 0 RB 1 (SUTURE) ×22
SUT PROLENE 4 0 SH DA (SUTURE) ×12 IMPLANT
SUT PROLENE 4-0 RB1 .5 CRCL 36 (SUTURE) ×22 IMPLANT
SUT PROLENE 5 0 C 1 36 (SUTURE) ×8 IMPLANT
SUT PROLENE 6 0 C 1 30 (SUTURE) ×40 IMPLANT
SUT PROLENE 8 0 BV175 6 (SUTURE) ×8 IMPLANT
SUT SILK  1 MH (SUTURE) ×6
SUT SILK 1 MH (SUTURE) ×6 IMPLANT
SUT SILK 1 TIES 10X30 (SUTURE) ×4 IMPLANT
SUT SILK 2 0 SH CR/8 (SUTURE) ×12 IMPLANT
SUT SILK 2 0 TIES 10X30 (SUTURE) ×4 IMPLANT
SUT SILK 2 0 TIES 17X18 (SUTURE) ×2
SUT SILK 2-0 18XBRD TIE BLK (SUTURE) ×2 IMPLANT
SUT SILK 3 0 SH CR/8 (SUTURE) ×4 IMPLANT
SUT SILK 4 0 TIE 10X30 (SUTURE) ×8 IMPLANT
SUT STEEL 6MS V (SUTURE) ×8 IMPLANT
SUT STEEL SZ 6 DBL 3X14 BALL (SUTURE) ×4 IMPLANT
SUT TEM PAC WIRE 2 0 SH (SUTURE) ×16 IMPLANT
SUT VIC AB 1 CTX 36 (SUTURE) ×4
SUT VIC AB 1 CTX36XBRD ANBCTR (SUTURE) ×4 IMPLANT
SUT VIC AB 2-0 CTX 27 (SUTURE) ×8 IMPLANT
SUT VIC AB 3-0 X1 27 (SUTURE) ×12 IMPLANT
SYR 10ML KIT SKIN ADHESIVE (MISCELLANEOUS) IMPLANT
SYSTEM SAHARA CHEST DRAIN ATS (WOUND CARE) ×4 IMPLANT
TAPE CLOTH SURG 4X10 WHT LF (GAUZE/BANDAGES/DRESSINGS) ×4 IMPLANT
TAPE PAPER 2X10 WHT MICROPORE (GAUZE/BANDAGES/DRESSINGS) ×4 IMPLANT
TOWEL GREEN STERILE (TOWEL DISPOSABLE) ×16 IMPLANT
TOWEL GREEN STERILE FF (TOWEL DISPOSABLE) ×8 IMPLANT
TOWEL OR 17X24 6PK STRL BLUE (TOWEL DISPOSABLE) ×8 IMPLANT
TOWEL OR 17X26 10 PK STRL BLUE (TOWEL DISPOSABLE) IMPLANT
TRAY FOLEY SILVER 16FR TEMP (SET/KITS/TRAYS/PACK) ×4 IMPLANT
UNDERPAD 30X30 (UNDERPADS AND DIAPERS) ×4 IMPLANT
VALVE MAGNA EASE 21MM (Prosthesis & Implant Heart) ×4 IMPLANT
VENT LEFT HEART 12002 (CATHETERS) ×8
WATER STERILE IRR 1000ML POUR (IV SOLUTION) ×8 IMPLANT

## 2017-09-29 NOTE — Anesthesia Procedure Notes (Signed)
Arterial Line Insertion Start/End10/03/2017 6:45 AM, 09/29/2017 7:00 AM Performed by: Andree Elk, CRNA  Patient location: Pre-op. Preanesthetic checklist: patient identified, IV checked, site marked, risks and benefits discussed, surgical consent, monitors and equipment checked, pre-op evaluation and timeout performed Lidocaine 1% used for infiltration Left, radial was placed Catheter size: 20 G Hand hygiene performed , maximum sterile barriers used  and Seldinger technique used Allen's test indicative of satisfactory collateral circulation Attempts: 1 Procedure performed without using ultrasound guided technique. Following insertion, Biopatch and dressing applied. Post procedure assessment: normal  Patient tolerated the procedure well with no immediate complications.

## 2017-09-29 NOTE — Progress Notes (Signed)
RT advanced ET tube 2 cm to 24 at the lip per MD Dr. Donata Clay.

## 2017-09-29 NOTE — Progress Notes (Signed)
Pharmacy Antibiotic Note  Jonathan Brown is a 66 y.o. male s/p AVR today. Pharmacy consulted to dose vancomycin x 48 hours post-op for surgical prophylaxis. Renal function wnl.  Vancomycin trough goal 10-15  Plan: 1) Vancomycin 1g IV q12 x 48 hours 2) Follow renal function, LOT, level if needed     Temp (24hrs), Avg:98.4 F (36.9 C), Min:98.4 F (36.9 C), Max:98.4 F (36.9 C)   Recent Labs Lab 09/27/17 0833  09/29/17 0907 09/29/17 0930 09/29/17 1033 09/29/17 1140 09/29/17 1253  WBC 8.9  --   --   --   --   --   --   CREATININE 0.94  < > 0.90 0.70 0.70 0.60* 0.80  < > = values in this interval not displayed.  Estimated Creatinine Clearance: 100.4 mL/min (by C-G formula based on SCr of 0.8 mg/dL).    No Known Allergies  Antimicrobials this admission: 10/4 Vancomycin >> 10/4 Cefuroxime post-op  Dose adjustments this admission: n/a  Microbiology results: none  Thank you for allowing pharmacy to be a part of this patient's care.  Fredrik Rigger 09/29/2017 2:12 PM

## 2017-09-29 NOTE — OR Nursing (Signed)
12:35 - 45 minute call to SICU nurse 13:00 - 20 minute call to SICU nurse

## 2017-09-29 NOTE — Progress Notes (Signed)
Advanced 2 cm per MD

## 2017-09-29 NOTE — Progress Notes (Signed)
Pre Procedure note for inpatients:   Jonathan Brown has been scheduled for Procedure(s): AORTIC VALVE REPLACEMENT (AVR) (N/A) TRANSESOPHAGEAL ECHOCARDIOGRAM (TEE) (N/A) today. The various methods of treatment have been discussed with the patient. After consideration of the risks, benefits and treatment options the patient has consented to the planned procedure.   The patient has been seen and labs reviewed. There are no changes in the patient's condition to prevent proceeding with the planned procedure today.  Recent labs:  Lab Results  Component Value Date   WBC 8.9 09/27/2017   HGB 13.0 09/27/2017   HCT 38.6 (L) 09/27/2017   PLT 143 (L) 09/27/2017   GLUCOSE 116 (H) 09/27/2017   CHOL 147 06/14/2017   TRIG 82 06/14/2017   HDL 34 (L) 06/14/2017   LDLCALC 97 06/14/2017   ALT 24 09/27/2017   AST 20 09/27/2017   NA 138 09/27/2017   K 3.6 09/27/2017   CL 113 (H) 09/27/2017   CREATININE 0.94 09/27/2017   BUN 10 09/27/2017   CO2 18 (L) 09/27/2017   TSH 2.16 06/14/2017   PSA 1.6 11/04/2016   INR 1.06 09/27/2017   HGBA1C 5.1 09/27/2017   MICROALBUR 0.2 03/14/2017    Kerin Perna III, MD 09/29/2017 7:29 AM

## 2017-09-29 NOTE — Progress Notes (Signed)
Increased FIO2 to 60% to increase sats. Sats 96% and vitals are stable. RT will continue to monitor.

## 2017-09-29 NOTE — Anesthesia Procedure Notes (Signed)
Central Venous Catheter Insertion Performed by: Val Eagle, anesthesiologist Start/End10/03/2017 7:29 AM, 09/29/2017 7:29 AM Patient location: Pre-op. Preanesthetic checklist: patient identified, IV checked, site marked, risks and benefits discussed, surgical consent, monitors and equipment checked, pre-op evaluation, timeout performed and anesthesia consent Hand hygiene performed  and maximum sterile barriers used  PA cath was placed.Swan type:thermodilution Procedure performed without using ultrasound guided technique. Attempts: 1 Patient tolerated the procedure well with no immediate complications.

## 2017-09-29 NOTE — Progress Notes (Signed)
Patient extubated at 1748 after rapid sicu wean. NIF -20, VC 1.2L. Able to vocalize and clear secretions. Currently on 4 LNC. RT to monitor as needed

## 2017-09-29 NOTE — Anesthesia Procedure Notes (Signed)
Central Venous Catheter Insertion Performed by: Val Eagle, anesthesiologist Start/End10/03/2017 7:14 AM, 09/29/2017 7:23 AM Patient location: Pre-op. Preanesthetic checklist: patient identified, IV checked, site marked, risks and benefits discussed, surgical consent, monitors and equipment checked, pre-op evaluation, timeout performed and anesthesia consent Lidocaine 1% used for infiltration and patient sedated Hand hygiene performed  and maximum sterile barriers used  Catheter size: 8.5 Fr Total catheter length 10. Sheath introducer Procedure performed using ultrasound guided technique. Ultrasound Notes:anatomy identified, needle tip was noted to be adjacent to the nerve/plexus identified, no ultrasound evidence of intravascular and/or intraneural injection and image(s) printed for medical record Attempts: 1 Following insertion, line sutured, dressing applied and Biopatch. Post procedure assessment: blood return through all ports, free fluid flow and no air  Patient tolerated the procedure well with no immediate complications.

## 2017-09-29 NOTE — Transfer of Care (Signed)
Immediate Anesthesia Transfer of Care Note  Patient: Jonathan Brown  Procedure(s) Performed: AORTIC VALVE REPLACEMENT (AVR) using 21mm Magna Ease valve (N/A Chest) TRANSESOPHAGEAL ECHOCARDIOGRAM (TEE) (N/A )  Patient Location: SICU  Anesthesia Type:General  Level of Consciousness: sedated, unresponsive and Patient remains intubated per anesthesia plan  Airway & Oxygen Therapy: Patient remains intubated per anesthesia plan and Patient placed on Ventilator (see vital sign flow sheet for setting)  Post-op Assessment: Report given to RN and Post -op Vital signs reviewed and stable  Post vital signs: Reviewed and stable  Last Vitals:  Vitals:   09/29/17 0618  BP: 132/77  Pulse: 60  Resp: 19  Temp: 36.9 C  SpO2: 100%    Last Pain:  Vitals:   09/29/17 0618  TempSrc: Oral  PainSc:          Complications: No apparent anesthesia complications

## 2017-09-29 NOTE — Brief Op Note (Addendum)
09/29/2017  12:02 PM  PATIENT:  Jonathan Brown  66 y.o. male  PRE-OPERATIVE DIAGNOSIS:  Severe aortic stenosis  POST-OPERATIVE DIAGNOSIS:  Severe aortic stenosis  PROCEDURE:  TRANSESOPHAGEAL ECHOCARDIOGRAM (TEE), MEDIAN STERNOTOMY for AORTIC VALVE REPLACEMENT (AVR) (using  Magna Ease Pericardial tissue valve, Model [id number], size 21)    SURGEON:  Surgeon(s) and Role:    Kerin Perna, MD - Primary  PHYSICIAN ASSISTANT: Doree Fudge PA-C  ASSISTANTS: Christinia Gully RNFA  ANESTHESIA:   general  EBL:  400 cc Total I/O In: -  Out: 1150 [Urine:1150]  BLOOD ADMINISTERED:1 PRBC  DRAINS: 1 MT  SPECIMEN:  Source of Specimen:  Native AV leaflets  DISPOSITION OF SPECIMEN:  PATHOLOGY  COUNTS CORRECT:  YES   DICTATION: .Dragon Dictation  PLAN OF CARE: Admit to inpatient   PATIENT DISPOSITION:  ICU - intubated and hemodynamically stable.   Delay start of Pharmacological VTE agent (>24hrs) due to surgical blood loss or risk of bleeding: yes  BASELINE WEIGHT: 86.6 kg

## 2017-09-29 NOTE — Progress Notes (Signed)
Patient ID: SIGMOND PATALANO, male   DOB: 10-23-1951, 66 y.o.   MRN: [id number] EVENING ROUNDS NOTE :     [address]       [name] [address]             [phone]                 Day of Surgery Procedure(s) (LRB): AORTIC VALVE REPLACEMENT (AVR) using 21mm Magna Ease valve (N/A) TRANSESOPHAGEAL ECHOCARDIOGRAM (TEE) (N/A)  Total Length of Stay:  LOS: 0 days  BP 102/64   Pulse 88   Temp 98.6 F (37 C)   Resp 11   Ht  (1.753 m)   Wt 190 lb 14.7 oz (86.6 kg)   SpO2 98%   BMI 28.19 kg/m  body surface area is 2.05 meters squared.  .Intake/Output      10/04 0701 - 10/05 0700   I.V. (mL/kg) 2628.2 (30.3)   Blood 215   NG/GT 30   IV Piggyback 1300   Total Intake(mL/kg) 4173.2 (48.2)   Urine (mL/kg/hr) 3045 (2.9)   Chest Tube 250   Total Output 3295   Net +878.2         . sodium chloride 20 mL/hr at 09/29/17 1400  . [START ON 09/30/2017] sodium chloride    . sodium chloride 10 mL/hr at 09/29/17 1400  . albumin human    . cefUROXime (ZINACEF)  IV Stopped (09/29/17 1600)  . dexmedetomidine (PRECEDEX) IV infusion Stopped (09/29/17 1725)  . DOPamine 3 mcg/kg/min (09/29/17 1832)  . famotidine (PEPCID) IV Stopped (09/29/17 1530)  . insulin (NOVOLIN-R) infusion 3.8 Units/hr (09/29/17 1830)  . lactated ringers    . lactated ringers 20 mL/hr at 09/29/17 1400  . magnesium sulfate 4 g (09/29/17 1457)  . milrinone 0.25 mcg/kg/min (09/29/17 1400)  . nitroGLYCERIN Stopped (09/29/17 1752)  . phenylephrine (NEO-SYNEPHRINE) Adult infusion Stopped (09/29/17 1640)  . vancomycin 1,000 mg (09/29/17 1815)     Lab Results  Component Value Date   WBC 18.7 (H) 09/29/2017   HGB 11.6 (L) 09/29/2017   HCT 35.0 (L) 09/29/2017   PLT 80 (L) 09/29/2017   GLUCOSE 142 (H) 09/29/2017   CHOL 147 06/14/2017   TRIG 82 06/14/2017   HDL 34 (L) 06/14/2017   LDLCALC 97 06/14/2017   ALT 24 09/27/2017   AST 20 09/27/2017   NA 145 09/29/2017   K 4.4 09/29/2017   CL 108  09/29/2017   CREATININE 0.80 09/29/2017   BUN 13 09/29/2017   CO2 18 (L) 09/27/2017   TSH 2.16 06/14/2017   PSA 1.6 11/04/2016   INR 1.46 09/29/2017   HGBA1C 5.1 09/27/2017   MICROALBUR 0.2 03/14/2017   Stable post op , low dose dopamine and milrinone  Not bleeding Extubated   Delight Ovens MD  Beeper [phone] Office [phone] 09/29/2017 7:05 PM

## 2017-09-29 NOTE — Anesthesia Procedure Notes (Signed)
Procedure Name: Intubation Date/Time: 09/29/2017 7:52 AM Performed by: Rebekah Chesterfield L Pre-anesthesia Checklist: Patient identified, Emergency Drugs available, Suction available and Patient being monitored Patient Re-evaluated:Patient Re-evaluated prior to induction Oxygen Delivery Method: Circle System Utilized Preoxygenation: Pre-oxygenation with 100% oxygen Induction Type: IV induction Ventilation: Mask ventilation with difficulty and Oral airway inserted - appropriate to patient size Laryngoscope Size: Mac and 3 Grade View: Grade II Tube type: Oral Tube size: 7.5 mm Number of attempts: 1 Airway Equipment and Method: Stylet and Oral airway Placement Confirmation: ETT inserted through vocal cords under direct vision,  positive ETCO2 and breath sounds checked- equal and bilateral Secured at: 21 cm Tube secured with: Tape Dental Injury: Teeth and Oropharynx as per pre-operative assessment

## 2017-09-30 ENCOUNTER — Encounter (HOSPITAL_COMMUNITY): Payer: Self-pay | Admitting: Cardiothoracic Surgery

## 2017-09-30 ENCOUNTER — Inpatient Hospital Stay (HOSPITAL_COMMUNITY): Payer: Medicare HMO

## 2017-09-30 LAB — BASIC METABOLIC PANEL
ANION GAP: 7 (ref 5–15)
BUN: 9 mg/dL (ref 6–20)
CALCIUM: 8.2 mg/dL — AB (ref 8.9–10.3)
CO2: 22 mmol/L (ref 22–32)
CREATININE: 0.96 mg/dL (ref 0.61–1.24)
Chloride: 108 mmol/L (ref 101–111)
Glucose, Bld: 113 mg/dL — ABNORMAL HIGH (ref 65–99)
Potassium: 3.9 mmol/L (ref 3.5–5.1)
SODIUM: 137 mmol/L (ref 135–145)

## 2017-09-30 LAB — GLUCOSE, CAPILLARY
Glucose-Capillary: 104 mg/dL — ABNORMAL HIGH (ref 65–99)
Glucose-Capillary: 110 mg/dL — ABNORMAL HIGH (ref 65–99)
Glucose-Capillary: 112 mg/dL — ABNORMAL HIGH (ref 65–99)
Glucose-Capillary: 112 mg/dL — ABNORMAL HIGH (ref 65–99)
Glucose-Capillary: 113 mg/dL — ABNORMAL HIGH (ref 65–99)
Glucose-Capillary: 115 mg/dL — ABNORMAL HIGH (ref 65–99)
Glucose-Capillary: 119 mg/dL — ABNORMAL HIGH (ref 65–99)
Glucose-Capillary: 119 mg/dL — ABNORMAL HIGH (ref 65–99)
Glucose-Capillary: 121 mg/dL — ABNORMAL HIGH (ref 65–99)
Glucose-Capillary: 121 mg/dL — ABNORMAL HIGH (ref 65–99)
Glucose-Capillary: 122 mg/dL — ABNORMAL HIGH (ref 65–99)
Glucose-Capillary: 141 mg/dL — ABNORMAL HIGH (ref 65–99)
Glucose-Capillary: 141 mg/dL — ABNORMAL HIGH (ref 65–99)
Glucose-Capillary: 180 mg/dL — ABNORMAL HIGH (ref 65–99)
Glucose-Capillary: 246 mg/dL — ABNORMAL HIGH (ref 65–99)
Glucose-Capillary: 273 mg/dL — ABNORMAL HIGH (ref 65–99)

## 2017-09-30 LAB — CBC
HCT: 30.1 % — ABNORMAL LOW (ref 39.0–52.0)
HEMATOCRIT: 30.4 % — AB (ref 39.0–52.0)
HEMOGLOBIN: 10.2 g/dL — AB (ref 13.0–17.0)
Hemoglobin: 10 g/dL — ABNORMAL LOW (ref 13.0–17.0)
MCH: 29.2 pg (ref 26.0–34.0)
MCH: 29.9 pg (ref 26.0–34.0)
MCHC: 33.2 g/dL (ref 30.0–36.0)
MCHC: 33.6 g/dL (ref 30.0–36.0)
MCV: 88 fL (ref 78.0–100.0)
MCV: 89.1 fL (ref 78.0–100.0)
PLATELETS: 69 10*3/uL — AB (ref 150–400)
Platelets: 72 10*3/uL — ABNORMAL LOW (ref 150–400)
RBC: 3.42 MIL/uL — ABNORMAL LOW (ref 4.22–5.81)
RBC: 3.41 MIL/uL — AB (ref 4.22–5.81)
RDW: 13.5 % (ref 11.5–15.5)
RDW: 13.9 % (ref 11.5–15.5)
WBC: 11.7 10*3/uL — AB (ref 4.0–10.5)
WBC: 15.2 10*3/uL — ABNORMAL HIGH (ref 4.0–10.5)

## 2017-09-30 LAB — BPAM FFP
Blood Product Expiration Date: 201810062359
Blood Product Expiration Date: 201810092359
ISSUE DATE / TIME: 201810041221
ISSUE DATE / TIME: 201810041221
Unit Type and Rh: 7300
Unit Type and Rh: 7300

## 2017-09-30 LAB — PREPARE FRESH FROZEN PLASMA
Unit division: 0
Unit division: 0

## 2017-09-30 LAB — MAGNESIUM
MAGNESIUM: 2.4 mg/dL (ref 1.7–2.4)
Magnesium: 2.4 mg/dL (ref 1.7–2.4)

## 2017-09-30 LAB — CREATININE, SERUM
Creatinine, Ser: 1.16 mg/dL (ref 0.61–1.24)
GFR calc non Af Amer: >60 mL/min (ref >60)

## 2017-09-30 MED ORDER — INSULIN ASPART 100 UNIT/ML ~~LOC~~ SOLN
0.0000 [IU] | SUBCUTANEOUS | Status: DC
  Administered 2017-09-30: 8 [IU] via SUBCUTANEOUS
  Administered 2017-09-30: 12 [IU] via SUBCUTANEOUS
  Administered 2017-09-30 (×2): 2 [IU] via SUBCUTANEOUS
  Administered 2017-10-01: 4 [IU] via SUBCUTANEOUS
  Administered 2017-10-01: 12 [IU] via SUBCUTANEOUS
  Administered 2017-10-01: 8 [IU] via SUBCUTANEOUS
  Administered 2017-10-01 – 2017-10-02 (×4): 2 [IU] via SUBCUTANEOUS

## 2017-09-30 MED ORDER — PNEUMOCOCCAL VAC POLYVALENT 25 MCG/0.5ML IJ INJ: 0.5000 mL | INJECTION | INTRAMUSCULAR | Status: DC

## 2017-09-30 MED ORDER — FUROSEMIDE 10 MG/ML IJ SOLN
20.0000 mg | Freq: Two times a day (BID) | INTRAMUSCULAR | Status: DC
  Administered 2017-09-30 – 2017-10-02 (×5): 20 mg via INTRAVENOUS
  Filled 2017-09-30 (×5): qty 2

## 2017-09-30 MED ORDER — ORAL CARE MOUTH RINSE
15.0000 mL | Freq: Two times a day (BID) | OROMUCOSAL | Status: DC
  Administered 2017-09-30 – 2017-10-03 (×3): 15 mL via OROMUCOSAL

## 2017-09-30 MED ORDER — GUAIFENESIN ER 600 MG PO TB12
600.0000 mg | ORAL_TABLET | Freq: Two times a day (BID) | ORAL | Status: DC
  Administered 2017-09-30 – 2017-10-06 (×12): 600 mg via ORAL
  Filled 2017-09-30 (×12): qty 1

## 2017-09-30 MED FILL — Heparin Sodium (Porcine) Inj 1000 Unit/ML: INTRAMUSCULAR | Qty: 20 | Status: AC

## 2017-09-30 MED FILL — Electrolyte-R (PH 7.4) Solution: INTRAVENOUS | Qty: 3000 | Status: AC

## 2017-09-30 MED FILL — Lidocaine HCl IV Inj 20 MG/ML: INTRAVENOUS | Qty: 15 | Status: AC

## 2017-09-30 MED FILL — Sodium Bicarbonate IV Soln 8.4%: INTRAVENOUS | Qty: 50 | Status: AC

## 2017-09-30 MED FILL — Mannitol IV Soln 20%: INTRAVENOUS | Qty: 500 | Status: AC

## 2017-09-30 MED FILL — Sodium Chloride IV Soln 0.9%: INTRAVENOUS | Qty: 2000 | Status: AC

## 2017-09-30 NOTE — Care Management Note (Addendum)
Case Management Note  Patient Details  Name: Jonathan Brown MRN: [id number] Date of Birth: 03/19/1951  Subjective/Objective:  From home with wife, who will be with him 24/7 for assistance, POD 1 MVR, extubated, conts on milrinone and dopamine.    10/8 1433 Letha Cape RN, BSN - for PPM placement today.                Action/Plan: NCM will follow for dc needs.   Expected Discharge Date:                  Expected Discharge Plan:  Home/Self Care  In-House Referral:     Discharge planning Services  CM Consult  Post Acute Care Choice:    Choice offered to:     DME Arranged:    DME Agency:     HH Arranged:    HH Agency:     Status of Service:  In process, will continue to follow  If discussed at Long Length of Stay Meetings, dates discussed:    Additional Comments:  Leone Haven, RN 09/30/2017, 11:19 AM

## 2017-09-30 NOTE — Anesthesia Postprocedure Evaluation (Signed)
Anesthesia Post Note  Patient: [name]  Procedure(s) Performed: AORTIC VALVE REPLACEMENT (AVR) using 21mm Magna Ease valve (N/A Chest) TRANSESOPHAGEAL ECHOCARDIOGRAM (TEE) (N/A )     Patient location during evaluation: SICU Anesthesia Type: General Level of consciousness: awake Pain management: pain level controlled Vital Signs Assessment: post-procedure vital signs reviewed and stable Respiratory status: respiratory function stable and patient connected to nasal cannula oxygen Cardiovascular status: stable Postop Assessment: no apparent nausea or vomiting Anesthetic complications: no Comments: Patient extubated uneventfully in ICU on POD#0, doing well.    Last Vitals:  Vitals:   09/30/17 0600 09/30/17 0700  BP: 122/65   Pulse: 89 88  Resp: 13 14  Temp: 37.2 C 37.3 C  SpO2: 97% 97%    Last Pain:  Vitals:   09/30/17 0450  TempSrc:   PainSc: 7                  Jonathan Brown

## 2017-09-30 NOTE — Op Note (Signed)
NAME:  Jonathan Brown, Jonathan Brown NO.:  [id number]  MEDICAL RECORD NO.:  [id number]  LOCATION:                                 FACILITY:  PHYSICIAN:  Kerin Perna, M.D.       DATE OF BIRTH:  DATE OF PROCEDURE:  09/29/2017 DATE OF DISCHARGE:                              OPERATIVE REPORT   OPERATION:  Aortic valve replacement using a 21-mm pericardial Magna Ease valve (serial [id number]).  SURGEON:  Kerin Perna, MD.  ASSISTANT:  Lin Landsman, PA-C.  PREOPERATIVE DIAGNOSES:  Severe aortic stenosis, bicuspid aortic valve, pulmonary hypertension, and left ventricular hypertrophy.  POSTOPERATIVE DIAGNOSES:  Severe aortic stenosis, bicuspid aortic valve, pulmonary hypertension, and left ventricular hypertrophy.  ANESTHESIA:  General by Dr. Mal Amabile.  CLINICAL NOTE:  The patient is a very nice 66 year old gentleman followed by Dr. Jacinto Halim for history of murmur and aortic stenosis by echocardiogram.  His transvalvular gradient has increased over the past few years.  The patient had been recommended by Dr. Jacinto Halim for aortic valve replacement, but the patient has declined until just recently. The patient recently also developed symptoms of decreasing exercise tolerance and easy fatigability, but no discrete symptoms of presyncope or chest pain.  Cardiac catheterization showed no significant coronary artery disease and I had discussed the procedure of aortic valve replacement through sternotomy and cardiopulmonary bypass in detail with the patient and his wife when I saw the patient in consultation in the office.  I discussed the procedure benefits including increased survival and increased quality of life and preservation of LV function.  I had discussed with the patient and wife the risks including stroke, bleeding requiring blood transfusion, arrhythmias including heart block and pacemaker requirement, postoperative infection, postoperative pulmonary problems including  pleural effusion, and death.  The patient demonstrated his understanding of these issues and agreed to proceed with surgery under what I felt was an informed consent.  OPERATIVE FINDINGS: 1. Severe dilatation and LVH. 2. Shortened ascending aorta which was not enlarged, but had     calcification in the aortic root. 3. Severely malformed and calcified bicuspid aortic valve. 4. Successful AVR with a 21-mm tissue valve with normal functioning     prosthetic valve without significant gradient or AI after     termination of cardiopulmonary bypass.  DESCRIPTION OF PROCEDURE:  The patient was brought from the preop holding area after being evaluated and informed consent signed.  The patient was placed supine on the operating table.  General anesthesia was induced under invasive hemodynamic monitoring.  The patient remained stable.  Transesophageal echo probe was placed by the anesthesia team, which confirmed the preoperative diagnosis of severe aortic stenosis. The patient was prepped and draped as a sterile field.  A proper time- out was performed.  A sternal incision was made.  The pericardium was opened.  There was moderate clear pericardial effusion, which was drained.  Heparin was administered.  Pursestrings were placed in ascending aorta and right atrium.  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  Exposure of the aorta was very difficult.  After heparin was administered and the ACT was documented as  being therapeutic, the patient was cannulated and placed on cardiopulmonary bypass.  A left ventricular vent was placed via the right superior pulmonary vein.  Cardioplegia cannulas were placed both antegrade aortic and retrograde coronary sinus cardioplegia.  The patient was cooled to 32 degrees and an aortic crossclamp was applied.  The large clamp was needed because of the deep chest and posteriorly located aorta.  One liter of cold blood  cardioplegia was delivered in split doses between the antegrade aortic and retrograde coronary sinus catheters.  There was good cardioplegic arrest, and septal temperature dropped less than 12 degrees.  Cardioplegia was delivered every 20 minutes while the crossclamp was in place.  The aorta was opened with a transverse aortotomy.  The aortic valve was inspected.  It was extremely dysplastic and had only a slit-like opening.  It was heavily calcified.  With great effort and tedious dissection, the aortic valve and the annular calcium were excised and removed.  The outflow tract was irrigated with copious amounts of cold saline.  The annulus was sized to a 21-mm Magna Ease valve.  Subannular 2-0 Ethibond pledgeted sutures were placed around the anulus numbering 13 total.  The valve was prepared according to the protocol.  The sutures were placed through the sewing ring and the valve was seated and conformed to the annulus nicely.  All sutures were tied.  There was no obstruction of the coronary ostium.  The aortotomy was then closed in 2 layers with running 4-0 Prolene and air was vented from the coronaries with a dose of retrograde warm blood cardioplegia in the usual de-airing maneuvers.  The crossclamp was then removed.  The patient was cardioverted back to a regular rhythm.  Temporary pacing wires were applied.  The patient was rewarmed and reperfused.  The aortotomy was inspected and was hemostatic.  The patient was started on low-dose milrinone and renal dose dopamine. After the patient was adequately rewarmed and reperfused, the lungs were re-expanded and the ventilator was resumed.  The patient was then weaned off cardiopulmonary bypass.  LV function was well preserved.  There was normal systolic function.  The prosthetic valve functioning well without leak or significant transvalvular gradient.  Protamine was administered without adverse reaction.  The cannulas were removed  and the cardioplegia cannula was removed.  The superior pericardial fat was closed over the aorta.  Anterior mediastinal chest tube was placed and brought out through separate incision.  The sternum was closed with interrupted steel wire.  The pectoralis fascia was closed with a running #1 Vicryl.  The subcutaneous and skin layers were closed in running Vicryl and sterile dressings were applied.  Total cardiopulmonary bypass time was 154 minutes.     Kerin Perna, M.D.   ______________________________ Kerin Perna, M.D.    PV/MEDQ  D:  09/29/2017  T:  09/29/2017  Job:  [id number]  cc:   Pamella Pert, MD

## 2017-10-01 ENCOUNTER — Inpatient Hospital Stay (HOSPITAL_COMMUNITY): Payer: Medicare HMO

## 2017-10-01 LAB — BASIC METABOLIC PANEL
Anion gap: 7 (ref 5–15)
BUN: 8 mg/dL (ref 6–20)
CO2: 22 mmol/L (ref 22–32)
Calcium: 8.4 mg/dL — ABNORMAL LOW (ref 8.9–10.3)
Chloride: 107 mmol/L (ref 101–111)
Creatinine, Ser: 1.04 mg/dL (ref 0.61–1.24)
GFR calc Af Amer: >60 mL/min (ref >60)
GFR calc non Af Amer: >60 mL/min (ref >60)
Glucose, Bld: 157 mg/dL — ABNORMAL HIGH (ref 65–99)
Potassium: 3.3 mmol/L — ABNORMAL LOW (ref 3.5–5.1)
Sodium: 136 mmol/L (ref 135–145)

## 2017-10-01 LAB — GLUCOSE, CAPILLARY
Glucose-Capillary: 100 mg/dL — ABNORMAL HIGH (ref 65–99)
Glucose-Capillary: 142 mg/dL — ABNORMAL HIGH (ref 65–99)
Glucose-Capillary: 155 mg/dL — ABNORMAL HIGH (ref 65–99)
Glucose-Capillary: 156 mg/dL — ABNORMAL HIGH (ref 65–99)
Glucose-Capillary: 168 mg/dL — ABNORMAL HIGH (ref 65–99)
Glucose-Capillary: 217 mg/dL — ABNORMAL HIGH (ref 65–99)
Glucose-Capillary: 259 mg/dL — ABNORMAL HIGH (ref 65–99)

## 2017-10-01 LAB — CBC
HCT: 28.7 % — ABNORMAL LOW (ref 39.0–52.0)
Hemoglobin: 9.5 g/dL — ABNORMAL LOW (ref 13.0–17.0)
MCH: 29.2 pg (ref 26.0–34.0)
MCHC: 33.1 g/dL (ref 30.0–36.0)
MCV: 88.3 fL (ref 78.0–100.0)
Platelets: 63 10*3/uL — ABNORMAL LOW (ref 150–400)
RBC: 3.25 MIL/uL — ABNORMAL LOW (ref 4.22–5.81)
RDW: 13.6 % (ref 11.5–15.5)
WBC: 14 10*3/uL — ABNORMAL HIGH (ref 4.0–10.5)

## 2017-10-01 LAB — POCT I-STAT, CHEM 8
BUN: 13 mg/dL (ref 6–20)
Calcium, Ion: 1.25 mmol/L (ref 1.15–1.40)
Chloride: 102 mmol/L (ref 101–111)
Creatinine, Ser: 0.9 mg/dL (ref 0.61–1.24)
Glucose, Bld: 193 mg/dL — ABNORMAL HIGH (ref 65–99)
HCT: 29 % — ABNORMAL LOW (ref 39.0–52.0)
Hemoglobin: 9.9 g/dL — ABNORMAL LOW (ref 13.0–17.0)
Potassium: 3.7 mmol/L (ref 3.5–5.1)
Sodium: 138 mmol/L (ref 135–145)
TCO2: 23 mmol/L (ref 22–32)

## 2017-10-01 MED ORDER — INSULIN GLARGINE 100 UNIT/ML ~~LOC~~ SOLN
14.0000 [IU] | Freq: Two times a day (BID) | SUBCUTANEOUS | Status: DC
  Administered 2017-10-01 – 2017-10-02 (×3): 14 [IU] via SUBCUTANEOUS
  Filled 2017-10-01 (×4): qty 0.14

## 2017-10-01 MED ORDER — POTASSIUM CHLORIDE 10 MEQ/50ML IV SOLN
10.0000 meq | INTRAVENOUS | Status: AC
  Administered 2017-10-01 (×3): 10 meq via INTRAVENOUS
  Filled 2017-10-01 (×3): qty 50

## 2017-10-01 MED ORDER — LOSARTAN POTASSIUM 25 MG PO TABS
25.0000 mg | ORAL_TABLET | Freq: Every day | ORAL | Status: DC
  Administered 2017-10-01 – 2017-10-04 (×4): 25 mg via ORAL
  Filled 2017-10-01 (×4): qty 1

## 2017-10-01 MED ORDER — POTASSIUM CHLORIDE CRYS ER 20 MEQ PO TBCR
20.0000 meq | EXTENDED_RELEASE_TABLET | Freq: Two times a day (BID) | ORAL | Status: DC
  Administered 2017-10-01 – 2017-10-04 (×8): 20 meq via ORAL
  Filled 2017-10-01 (×8): qty 1

## 2017-10-01 MED ORDER — ENOXAPARIN SODIUM 40 MG/0.4ML ~~LOC~~ SOLN
40.0000 mg | SUBCUTANEOUS | Status: DC
  Administered 2017-10-01 – 2017-10-04 (×4): 40 mg via SUBCUTANEOUS
  Filled 2017-10-01 (×5): qty 0.4

## 2017-10-01 MED ORDER — ALPRAZOLAM 0.5 MG PO TABS
0.5000 mg | ORAL_TABLET | Freq: Two times a day (BID) | ORAL | Status: DC | PRN
  Administered 2017-10-01 – 2017-10-02 (×2): 0.5 mg via ORAL
  Filled 2017-10-01 (×2): qty 1

## 2017-10-01 NOTE — Progress Notes (Signed)
2 Days Post-Op Procedure(s) (LRB): AORTIC VALVE REPLACEMENT (AVR) using 21mm Magna Ease valve (N/A) TRANSESOPHAGEAL ECHOCARDIOGRAM (TEE) (N/A) Sub Excellent diuresis with a-v pacing Now with underlying HR 62, appears junctional Will leave at av paced for diuresis, preop CHF  Objective: Vital signs in last 24 hours: Temp:  [98 F (36.7 C)-99.9 F (37.7 C)] 98.6 F (37 C) (10/06 0808) Pulse Rate:  [60-89] 88 (10/06 0600) Cardiac Rhythm: A-V Sequential paced;Other (Comment) (10/06 0600) Resp:  [17-37] 28 (10/06 0600) BP: (100-160)/(49-75) 100/56 (10/06 0600) SpO2:  [93 %-99 %] 95 % (10/06 0808) Arterial Line BP: (156-176)/(49-58) 162/49 (10/05 1300) Weight:  [196 lb 10.4 oz (89.2 kg)] 196 lb 10.4 oz (89.2 kg) (10/06 0500)  Hemodynamic parameters for last 24 hours: PAP: (40)/(13) 40/13  Intake/Output from previous day: 10/05 0701 - 10/06 0700 In: 1484.1 [P.O.:120; I.V.:1014.1; IV Piggyback:350] Out: 2950 [Urine:2770; Chest Tube:180] Intake/Output this shift: No intake/output data recorded.       Exam    General- alert and comfortable   Lungs- clear without rales, wheezes   Cor- regular rate and rhythm, no murmur , gallop   Abdomen- soft, non-tender   Extremities - warm, non-tender, minimal edema   Neuro- oriented, appropriate, no focal weakness   Lab Results:  Recent Labs  09/30/17 1721 10/01/17 0341  WBC 15.2* 14.0*  HGB 10.2* 9.5*  HCT 30.4* 28.7*  PLT 72* 63*   BMET:  Recent Labs  09/30/17 0344 09/30/17 1721 10/01/17 0341  NA 137  --  136  K 3.9  --  3.3*  CL 108  --  107  CO2 22  --  22  GLUCOSE 113*  --  157*  BUN 9  --  8  CREATININE 0.96 1.16 1.04  CALCIUM 8.2*  --  8.4*    PT/INR:  Recent Labs  09/29/17 1421  LABPROT 17.6*  INR 1.46   ABG    Component Value Date/Time   PHART 7.345 (L) 09/29/2017 2050   HCO3 19.6 (L) 09/29/2017 2050   TCO2 21 (L) 09/29/2017 2050   ACIDBASEDEF 5.0 (H) 09/29/2017 2050   O2SAT 96.0 09/29/2017 2050    CBG (last 3)   Recent Labs  09/30/17 1941 09/30/17 2343 10/01/17 0342  GLUCAP 273* 142* 156*    Assessment/Plan: S/P Procedure(s) (LRB): AORTIC VALVE REPLACEMENT (AVR) using 21mm Magna Ease valve (N/A) TRANSESOPHAGEAL ECHOCARDIOGRAM (TEE) (N/A) Mobilize Diuresis Diabetes control   LOS: 2 days    Jonathan Brown 10/01/2017

## 2017-10-01 NOTE — Progress Notes (Signed)
CT surgery p.m. Rounds  Patient ambulated two thirds around the unit AV paced at 80 Excellent diuresis Pulmonary symptoms improved with bronchodilator and Mucinex Anxiety an issue start low-dose Xanax twice a day  prn

## 2017-10-01 NOTE — Plan of Care (Signed)
Problem: Activity: Goal: Risk for activity intolerance will decrease Outcome: Progressing Pt tolerating out of bed to chair and ambulation in hallway well. Ambulated twice today 190-254ft. Will continue to encourage increased activity as tolerated.

## 2017-10-02 ENCOUNTER — Inpatient Hospital Stay (HOSPITAL_COMMUNITY): Payer: Medicare HMO

## 2017-10-02 DIAGNOSIS — I442 Atrioventricular block, complete: Principal | ICD-10-CM

## 2017-10-02 DIAGNOSIS — I359 Nonrheumatic aortic valve disorder, unspecified: Secondary | ICD-10-CM

## 2017-10-02 LAB — COMPREHENSIVE METABOLIC PANEL
ALT: 20 U/L (ref 17–63)
AST: 27 U/L (ref 15–41)
Albumin: 2.8 g/dL — ABNORMAL LOW (ref 3.5–5.0)
Alkaline Phosphatase: 49 U/L (ref 38–126)
Anion gap: 11 (ref 5–15)
BUN: 8 mg/dL (ref 6–20)
CO2: 23 mmol/L (ref 22–32)
Calcium: 8.4 mg/dL — ABNORMAL LOW (ref 8.9–10.3)
Chloride: 104 mmol/L (ref 101–111)
Creatinine, Ser: 0.98 mg/dL (ref 0.61–1.24)
GFR calc Af Amer: >60 mL/min (ref >60)
GFR calc non Af Amer: >60 mL/min (ref >60)
Glucose, Bld: 145 mg/dL — ABNORMAL HIGH (ref 65–99)
Potassium: 3.7 mmol/L (ref 3.5–5.1)
Sodium: 138 mmol/L (ref 135–145)
Total Bilirubin: 0.8 mg/dL (ref 0.3–1.2)
Total Protein: 5.7 g/dL — ABNORMAL LOW (ref 6.5–8.1)

## 2017-10-02 LAB — POCT I-STAT, CHEM 8
BUN: 15 mg/dL (ref 6–20)
Calcium, Ion: 1.25 mmol/L (ref 1.15–1.40)
Chloride: 103 mmol/L (ref 101–111)
Creatinine, Ser: 0.9 mg/dL (ref 0.61–1.24)
Glucose, Bld: 151 mg/dL — ABNORMAL HIGH (ref 65–99)
HCT: 29 % — ABNORMAL LOW (ref 39.0–52.0)
Hemoglobin: 9.9 g/dL — ABNORMAL LOW (ref 13.0–17.0)
Potassium: 3.8 mmol/L (ref 3.5–5.1)
Sodium: 138 mmol/L (ref 135–145)
TCO2: 23 mmol/L (ref 22–32)

## 2017-10-02 LAB — CBC
HCT: 28.7 % — ABNORMAL LOW (ref 39.0–52.0)
Hemoglobin: 9.5 g/dL — ABNORMAL LOW (ref 13.0–17.0)
MCH: 29.4 pg (ref 26.0–34.0)
MCHC: 33.1 g/dL (ref 30.0–36.0)
MCV: 88.9 fL (ref 78.0–100.0)
Platelets: 65 10*3/uL — ABNORMAL LOW (ref 150–400)
RBC: 3.23 MIL/uL — ABNORMAL LOW (ref 4.22–5.81)
RDW: 13.5 % (ref 11.5–15.5)
WBC: 16.8 10*3/uL — ABNORMAL HIGH (ref 4.0–10.5)

## 2017-10-02 LAB — GLUCOSE, CAPILLARY
Glucose-Capillary: 122 mg/dL — ABNORMAL HIGH (ref 65–99)
Glucose-Capillary: 123 mg/dL — ABNORMAL HIGH (ref 65–99)
Glucose-Capillary: 134 mg/dL — ABNORMAL HIGH (ref 65–99)
Glucose-Capillary: 170 mg/dL — ABNORMAL HIGH (ref 65–99)
Glucose-Capillary: 122 mg/dL — ABNORMAL HIGH (ref 65–99)

## 2017-10-02 LAB — SURGICAL PCR SCREEN
MRSA, PCR: NEGATIVE
Staphylococcus aureus: NEGATIVE

## 2017-10-02 MED ORDER — POTASSIUM CHLORIDE 10 MEQ/50ML IV SOLN
10.0000 meq | INTRAVENOUS | Status: AC
  Administered 2017-10-02 (×2): 10 meq via INTRAVENOUS
  Filled 2017-10-02 (×2): qty 50

## 2017-10-02 MED ORDER — INSULIN ASPART 100 UNIT/ML ~~LOC~~ SOLN
0.0000 [IU] | Freq: Three times a day (TID) | SUBCUTANEOUS | Status: DC
  Administered 2017-10-02: 3 [IU] via SUBCUTANEOUS
  Administered 2017-10-02 – 2017-10-04 (×3): 2 [IU] via SUBCUTANEOUS

## 2017-10-02 MED ORDER — SORBITOL 70 % PO SOLN
30.0000 mL | Freq: Once | ORAL | Status: AC
  Administered 2017-10-02: 30 mL via ORAL
  Filled 2017-10-02: qty 30

## 2017-10-02 MED ORDER — ALPRAZOLAM 0.5 MG PO TABS
1.0000 mg | ORAL_TABLET | Freq: Two times a day (BID) | ORAL | Status: DC | PRN
  Administered 2017-10-02 – 2017-10-06 (×3): 1 mg via ORAL
  Filled 2017-10-02 (×3): qty 2

## 2017-10-02 MED ORDER — LEVALBUTEROL HCL 1.25 MG/0.5ML IN NEBU: 1.2500 mg | INHALATION_SOLUTION | Freq: Four times a day (QID) | RESPIRATORY_TRACT | Status: DC

## 2017-10-02 MED ORDER — LEVALBUTEROL HCL 1.25 MG/0.5ML IN NEBU
1.2500 mg | INHALATION_SOLUTION | Freq: Four times a day (QID) | RESPIRATORY_TRACT | Status: DC
  Administered 2017-10-02 – 2017-10-03 (×4): 1.25 mg via RESPIRATORY_TRACT
  Filled 2017-10-02 (×4): qty 0.5

## 2017-10-02 MED ORDER — INSULIN ASPART 100 UNIT/ML ~~LOC~~ SOLN: 0.0000 [IU] | Freq: Every day | SUBCUTANEOUS | Status: DC

## 2017-10-02 NOTE — Progress Notes (Signed)
CT surgery p.m. Rounds  Appreciate evaluation by Dr. Ladona Ridgel for permanent pacemaker Patient with complete heart block being AV paced Diuresing well Ambulating in hallway Pulmonary status stable Possible permanent pacemaker tomorrow in cath lab

## 2017-10-02 NOTE — Consult Note (Signed)
Cardiology Consultation:   Patient ID: VERNIS EID; [id number]; 1951/10/03   Admit date: 09/29/2017 Date of Consult: 10/02/2017  Primary Care Provider: Jac Canavan, PA-C Primary Cardiologist: Jacinto Halim Primary Electrophysiologist:  new   Patient Profile:   Jonathan Brown is a 66 y.o. male with a hx of aortic stenosis who is being seen today for the evaluation of complete heart block at the request of Dr. Morton Peters.  History of Present Illness:   Mr. Moening is a very pleasant 66 year old man with a long history of aortic stenosis, and coronary artery disease, who underwent aortic valve replacement several days ago. His procedure was complicated by the development of complete heart block. He is referred now for additional evaluation. He has an underlying sinus rhythm with a narrow QRS escape in the 40s. He has no history of syncope. The patient had refused valve replacement for many years. He was noted to have received a 21 mm tissue valve at the time of his surgery.  Past Medical History:  Diagnosis Date  . Aortic stenosis    Dr. Jacinto Halim  . Arthritis   . CAD (coronary artery disease)   . Diabetes mellitus without complication (HCC) 2014   type 2   . Former smoker   . GERD (gastroesophageal reflux disease)   . Hyperlipidemia   . Hypertension 2012  . PVD (peripheral vascular disease) (HCC)   . 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 AORTIC VALVE REPLACEMENT N/A 09/29/2017   Procedure: AORTIC VALVE REPLACEMENT (AVR) using 21mm Magna Ease valve;  Surgeon: Kerin Perna, MD;  Location: [facility] OR;  Service: Open Heart Surgery;  Laterality: N/A;  . COLONOSCOPY  2008  . CORONARY ARTERY BYPASS GRAFT    . LEFT AND RIGHT HEART CATHETERIZATION WITH CORONARY ANGIOGRAM N/A 04/02/2014   Procedure: LEFT AND RIGHT HEART CATHETERIZATION WITH CORONARY ANGIOGRAM;  Surgeon: Pamella Pert, MD;  Location: [facility] CATH  LAB;  Service: Cardiovascular;  Laterality: N/A;  . TEE WITHOUT CARDIOVERSION N/A 04/30/2014   Procedure: TRANSESOPHAGEAL ECHOCARDIOGRAM (TEE);  Surgeon: Pamella Pert, MD;  Location: [facility] ENDOSCOPY;  Service: Cardiovascular;  Laterality: N/A;  . TEE WITHOUT CARDIOVERSION N/A 09/29/2017   Procedure: TRANSESOPHAGEAL ECHOCARDIOGRAM (TEE);  Surgeon: Donata Clay, Theron Arista, MD;  Location: [facility] OR;  Service: Open Heart Surgery;  Laterality: N/A;       Inpatient Medications: Scheduled Meds: . acetaminophen  1,000 mg Oral Q6H   Or  . acetaminophen (TYLENOL) oral liquid 160 mg/5 mL  1,000 mg Per Tube Q6H  . albuterol  2.5 mg Nebulization Q6H  . aspirin EC  325 mg Oral Daily   Or  . aspirin  324 mg Per Tube Daily  . bisacodyl  10 mg Oral Daily   Or  . bisacodyl  10 mg Rectal Daily  . docusate sodium  200 mg Oral Daily  . enoxaparin (LOVENOX) injection  40 mg Subcutaneous Q24H  . furosemide  20 mg Intravenous BID  . guaiFENesin  600 mg Oral BID  . insulin aspart  0-24 Units Subcutaneous Q4H  . insulin glargine  14 Units Subcutaneous BID  . losartan  25 mg Oral Daily  . mouth rinse  15 mL Mouth Rinse BID  . metoCLOPramide (REGLAN) injection  10 mg Intravenous Q6H  . pantoprazole  40 mg Oral Daily  . pneumococcal 23 valent vaccine  0.5 mL  Intramuscular Tomorrow-1000  . potassium chloride  20 mEq Oral BID  . pravastatin  40 mg Oral q1800  . sodium chloride flush  10-40 mL Intracatheter Q12H  . sodium chloride flush  10-40 mL Intracatheter Q12H  . sodium chloride flush  3 mL Intravenous Q12H   Continuous Infusions: . sodium chloride Stopped (09/30/17 1000)  . sodium chloride    . sodium chloride 10 mL/hr at 09/30/17 2300  . lactated ringers 20 mL/hr at 10/02/17 0800   PRN Meds: sodium chloride, ALPRAZolam, ondansetron (ZOFRAN) IV, oxyCODONE, sodium chloride flush, traMADol  Allergies:   No Known Allergies  Social History:   Social History   Social History  . Marital status: Married     Spouse name: N/A  . Number of children: N/A  . Years of education: N/A   Occupational History  . Not on file.   Social History Main Topics  . Smoking status: Former Smoker    Quit date: 09/28/2007  . Smokeless tobacco: Never Used  . Alcohol use No  . Drug use: No  . Sexual activity: Not on file   Other Topics Concern  . Not on file   Social History Narrative  . No narrative on file    Family History:    Family History  Problem Relation Age of Onset  . Other Mother        died of old age  . Heart disease Father   . Sleep apnea Sister   . Diabetes Brother        diabetic coma x 3 years  . Alcohol abuse Brother   . Heart disease Brother 48       MI  . Cancer Neg Hx   . Stroke Neg Hx      ROS:  Please see the history of present illness.  ROS  All other ROS reviewed and negative.     Physical Exam/Data:   Vitals:   10/02/17 0600 10/02/17 0700 10/02/17 0800 10/02/17 0900  BP: (!) 144/74 104/73 104/72 130/82  Pulse: 86 96 99 96  Resp: (!) 23 (!) 22 (!) 22 17  Temp:  98.9 F (37.2 C)    TempSrc:      SpO2: (!) 85% 96% 97% 96%  Weight:      Height:        Intake/Output Summary (Last 24 hours) at 10/02/17 1026 Last data filed at 10/02/17 0900  Gross per 24 hour  Intake              830 ml  Output             3225 ml  Net            -2395 ml   Filed Weights   09/30/17 0450 10/01/17 0500 10/02/17 0500  Weight: 196 lb 6.9 oz (89.1 kg) 196 lb 10.4 oz (89.2 kg) (!) 421 lb 15.4 oz (191.4 kg)    General:  Well nourished, well developed, in no acute distress HEENT: normal Lymph: no adenopathy Neck: 6 cm JVD Endocrine:  No thryomegaly Vascular: No carotid bruits; FA pulses 2+ bilaterally without bruits  Cardiac:  normal S1, S2; RRR; no murmur, midline dry sternotomy incision Lungs:  clear to auscultation bilaterally, no wheezing, rhonchi or rales  Abd: soft, nontender, no hepatomegaly  Ext: no edema Musculoskeletal:  No deformities, BUE and BLE strength  normal and equal Skin: warm and dry  Neuro:  CNs 2-12 intact, no focal abnormalities noted Psych:  Normal  affect   EKG:  The EKG was personally reviewed and demonstrates:  Normal sinus rhythm with AV sequential pacing Telemetry:  Telemetry was personally reviewed and demonstrates:  Normal sinus rhythm with AV sequential pacing.  Relevant CV Studies: Echo demonstrates preserved left ventricular systolic function  Laboratory Data:  Chemistry Recent Labs Lab 09/30/17 0344 09/30/17 1721 10/01/17 0341 10/01/17 1729 10/02/17 0605  NA 137  --  136 138 138  K 3.9  --  3.3* 3.7 3.7  CL 108  --  107 102 104  CO2 22  --  22  --  23  GLUCOSE 113*  --  157* 193* 145*  BUN 9  --  CREATININE 0.96 1.16 1.04 0.90 0.98  CALCIUM 8.2*  --  8.4*  --  8.4*  GFRNONAA >60 >60 >60  --  >60  GFRAA >60 >60 >60  --  >60  ANIONGAP 7  --  7  --  11     Recent Labs Lab 09/27/17 0833 10/02/17 0605  PROT 6.5 5.7*  ALBUMIN 3.7 2.8*  AST 20 27  ALT 24 20  ALKPHOS 74 49  BILITOT 1.3* 0.8   Hematology Recent Labs Lab 09/30/17 1721 10/01/17 0341 10/01/17 1729 10/02/17 0605  WBC 15.2* 14.0*  --  16.8*  RBC 3.41* 3.25*  --  3.23*  HGB 10.2* 9.5* 9.9* 9.5*  HCT 30.4* 28.7* 29.0* 28.7*  MCV 89.1 88.3  --  88.9  MCH 29.9 29.2  --  29.4  MCHC 33.6 33.1  --  33.1  RDW 13.9 13.6  --  13.5  PLT 72* 63*  --  65*   Cardiac EnzymesNo results for input(s): TROPONINI in the last 168 hours. No results for input(s): TROPIPOC in the last 168 hours.  BNPNo results for input(s): BNP, PROBNP in the last 168 hours.  DDimer No results for input(s): DDIMER in the last 168 hours.  Radiology/Studies:  Dg Chest Port 1 View  Result Date: 10/02/2017 CLINICAL DATA:  Followup aortic valve replacement. EXAM: PORTABLE CHEST 1 VIEW COMPARISON:  10/01/2017 FINDINGS: Right internal jugular venous access sheath remains in place. Cardiomegaly appears the same. The pulmonary vascularity is normal. The lungs are  clear. No effusions. IMPRESSION: Good postoperative appearance. Venous access sheath remains in place. Persistent stable enlargement of the cardiac silhouette. Electronically Signed   By: Paulina Fusi M.D.   On: 10/02/2017 07:01   Dg Chest Port 1 View  Result Date: 10/01/2017 CLINICAL DATA:  Followup.  Status post aortic valve replacement. EXAM: PORTABLE CHEST 1 VIEW COMPARISON:  09/30/2017 and earlier exams. FINDINGS: Cardiac silhouette is mildly enlarged. There is no mediastinal widening. Lungs are clear.  No convincing pleural effusion.  No pneumothorax. Right internal jugular introducer sheath and mediastinal tube remain in place. Swan-Ganz catheter has been removed. IMPRESSION: 1. No acute findings or evidence of an operative complication. 2. Remaining support apparatus is stable and well positioned. Electronically Signed   By: Amie Portland M.D.   On: 10/01/2017 07:42   Dg Chest Port 1 View  Result Date: 09/30/2017 CLINICAL DATA:  Status post cardiac surgery EXAM: PORTABLE CHEST 1 VIEW COMPARISON:  09/29/2017 FINDINGS: Swan-Ganz catheter and mediastinal drain are again noted and stable. The endotracheal tube and nasogastric catheter have been removed in the interval. The cardiac shadow remains enlarged. Poor inspiratory effort is noted with very minimal left basilar atelectasis. No pneumothorax is seen. IMPRESSION: Poor inspiratory effort with minimal left basilar atelectasis. Tubes and  lines as described. Electronically Signed   By: Alcide Clever M.D.   On: 09/30/2017 07:24   Dg Chest Port 1 View  Result Date: 09/29/2017 CLINICAL DATA:  Status post aortic valve replacement. EXAM: PORTABLE CHEST 1 VIEW COMPARISON:  Radiographs of September 27, 2017. FINDINGS: Endotracheal tube is seen projected over tracheal air shadow with distal tip approximately 7 cm above the carina. Nasogastric tube tip is seen in proximal stomach. Right internal jugular Swan-Ganz catheter is noted with tip directed into right  pulmonary artery. Sternotomy wires are noted. Aortic valve prosthesis is noted. No pneumothorax is noted. Minimal bibasilar subsegmental atelectasis is noted. Bony thorax is unremarkable. IMPRESSION: Endotracheal and nasogastric tubes are in grossly good position. No pneumothorax is noted. Electronically Signed   By: Lupita Raider, M.D.   On: 09/29/2017 14:46    Assessment and Plan:   1. Complete heart block after aortic valve replacement - the patient has had no recurrent AV conduction after 5 days. I've discussed the treatment options in detail. The risks, goals, benefits, and expectations of pacemaker insertion were reviewed with the patient. He wishes to proceed. We will attempt to get this accomplished tomorrow. 2. Aortic stenosis - he is status post aortic valve replacement with no residual exam evidence of stenosis or regurgitation.   For questions or updates, please contact [facility] Please consult [email or web address] for contact info under Cardiology/STEMI.   Signed, Lewayne Bunting, MD  10/02/2017 10:26 AM

## 2017-10-02 NOTE — Progress Notes (Signed)
3 Days Post-Op Procedure(s) (LRB): AORTIC VALVE REPLACEMENT (AVR) using 21mm Magna Ease valve (N/A) TRANSESOPHAGEAL ECHOCARDIOGRAM (TEE) (N/A) Subjective: waqlked in hall Good diuresis Slow nodal; rhythm- 54/min Will do better with ppm  Objective: Vital signs in last 24 hours: Temp:  [98.1 F (36.7 C)-99.4 F (37.4 C)] 98.9 F (37.2 C) (10/07 0700) Pulse Rate:  [80-99] 96 (10/07 0900) Cardiac Rhythm: A-V Sequential paced (10/07 0600) Resp:  [14-35] 17 (10/07 0900) BP: (91-191)/(50-151) 130/82 (10/07 0900) SpO2:  [85 %-100 %] 96 % (10/07 0900) Weight:  [421 lb 15.4 oz (191.4 kg)] 421 lb 15.4 oz (191.4 kg) (10/07 0500)  Hemodynamic parameters for last 24 hours:    Intake/Output from previous day: 10/06 0701 - 10/07 0700 In: 1082 [P.O.:420; I.V.:512; IV Piggyback:100] Out: 3100 [Urine:3100] Intake/Output this shift: Total I/O In: 40 [I.V.:40] Out: 400 [Urine:400]       Exam    General- alert and comfortable   Lungs- clear without rales, wheezes   Cor- regular rate and rhythm, no murmur , gallop   Abdomen- soft, non-tender   Extremities - warm, non-tender, minimal edema   Neuro- oriented, appropriate, no focal weakness   Lab Results:  Recent Labs  10/01/17 0341 10/01/17 1729 10/02/17 0605  WBC 14.0*  --  16.8*  HGB 9.5* 9.9* 9.5*  HCT 28.7* 29.0* 28.7*  PLT 63*  --  65*   BMET:  Recent Labs  10/01/17 0341 10/01/17 1729 10/02/17 0605  NA 136 138 138  K 3.3* 3.7 3.7  CL 107 102 104  CO2 22  --  23  GLUCOSE 157* 193* 145*  BUN CREATININE 1.04 0.90 0.98  CALCIUM 8.4*  --  8.4*    PT/INR:  Recent Labs  09/29/17 1421  LABPROT 17.6*  INR 1.46   ABG    Component Value Date/Time   PHART 7.345 (L) 09/29/2017 2050   HCO3 19.6 (L) 09/29/2017 2050   TCO2 23 10/01/2017 1729   ACIDBASEDEF 5.0 (H) 09/29/2017 2050   O2SAT 96.0 09/29/2017 2050   CBG (last 3)   Recent Labs  10/01/17 2326 10/02/17 0354 10/02/17 0848  GLUCAP 100* 122* 123*     Assessment/Plan: S/P Procedure(s) (LRB): AORTIC VALVE REPLACEMENT (AVR) using 21mm Magna Ease valve (N/A) TRANSESOPHAGEAL ECHOCARDIOGRAM (TEE) (N/A) Mobilize Diuresis Diabetes control PPM tomorrow- npo tonite   LOS: 3 days    Kathlee Nations Trigt III 10/02/2017

## 2017-10-03 ENCOUNTER — Inpatient Hospital Stay (HOSPITAL_COMMUNITY): Payer: Medicare HMO

## 2017-10-03 ENCOUNTER — Inpatient Hospital Stay (HOSPITAL_COMMUNITY)
Admission: RE | Disposition: A | Discharge status: Home / Self Care | Payer: Self-pay | Source: Ambulatory Visit | Attending: Cardiothoracic Surgery

## 2017-10-03 DIAGNOSIS — Z950 Presence of cardiac pacemaker: Secondary | ICD-10-CM

## 2017-10-03 HISTORY — DX: Presence of cardiac pacemaker: Z95.0

## 2017-10-03 HISTORY — PX: PACEMAKER IMPLANT: EP1218

## 2017-10-03 LAB — CBC
HCT: 26.5 % — ABNORMAL LOW (ref 39.0–52.0)
Hemoglobin: 8.7 g/dL — ABNORMAL LOW (ref 13.0–17.0)
MCH: 29.5 pg (ref 26.0–34.0)
MCHC: 32.8 g/dL (ref 30.0–36.0)
MCV: 89.8 fL (ref 78.0–100.0)
Platelets: 103 10*3/uL — ABNORMAL LOW (ref 150–400)
RBC: 2.95 MIL/uL — ABNORMAL LOW (ref 4.22–5.81)
RDW: 13.6 % (ref 11.5–15.5)
WBC: 11.8 10*3/uL — ABNORMAL HIGH (ref 4.0–10.5)

## 2017-10-03 LAB — BPAM RBC
Blood Product Expiration Date: 201810192359
Blood Product Expiration Date: 201810192359
Blood Product Expiration Date: 201810242359
Blood Product Expiration Date: 201810242359
ISSUE DATE / TIME: 201810021328
ISSUE DATE / TIME: 201810040931
ISSUE DATE / TIME: 201810040931
Unit Type and Rh: 7300
Unit Type and Rh: 7300
Unit Type and Rh: 7300
Unit Type and Rh: 7300

## 2017-10-03 LAB — GLUCOSE, CAPILLARY
Glucose-Capillary: 121 mg/dL — ABNORMAL HIGH (ref 65–99)
Glucose-Capillary: 103 mg/dL — ABNORMAL HIGH (ref 65–99)
Glucose-Capillary: 114 mg/dL — ABNORMAL HIGH (ref 65–99)
Glucose-Capillary: 111 mg/dL — ABNORMAL HIGH (ref 65–99)

## 2017-10-03 LAB — TYPE AND SCREEN
ABO/RH(D): B POS
Antibody Screen: NEGATIVE
Unit division: 0
Unit division: 0
Unit division: 0
Unit division: 0

## 2017-10-03 LAB — BASIC METABOLIC PANEL
Anion gap: 6 (ref 5–15)
BUN: 11 mg/dL (ref 6–20)
CO2: 24 mmol/L (ref 22–32)
Calcium: 8.5 mg/dL — ABNORMAL LOW (ref 8.9–10.3)
Chloride: 107 mmol/L (ref 101–111)
Creatinine, Ser: 0.91 mg/dL (ref 0.61–1.24)
GFR calc Af Amer: >60 mL/min (ref >60)
GFR calc non Af Amer: >60 mL/min (ref >60)
Glucose, Bld: 107 mg/dL — ABNORMAL HIGH (ref 65–99)
Potassium: 3.7 mmol/L (ref 3.5–5.1)
Sodium: 137 mmol/L (ref 135–145)

## 2017-10-03 SURGERY — PACEMAKER IMPLANT
Anesthesia: LOCAL

## 2017-10-03 MED ORDER — METOPROLOL TARTRATE 5 MG/5ML IV SOLN
5.0000 mg | Freq: Once | INTRAVENOUS | Status: AC
  Administered 2017-10-03: 5 mg via INTRAVENOUS

## 2017-10-03 MED ORDER — HEPARIN (PORCINE) IN NACL 2-0.9 UNIT/ML-% IJ SOLN
INTRAMUSCULAR | Status: AC
  Filled 2017-10-03: qty 500

## 2017-10-03 MED ORDER — FENTANYL CITRATE (PF) 100 MCG/2ML IJ SOLN
INTRAMUSCULAR | Status: AC
  Filled 2017-10-03: qty 2

## 2017-10-03 MED ORDER — SODIUM CHLORIDE 0.9 % IV SOLN: INTRAVENOUS | Status: DC

## 2017-10-03 MED ORDER — CEFAZOLIN SODIUM-DEXTROSE 2-4 GM/100ML-% IV SOLN
2.0000 g | INTRAVENOUS | Status: AC
  Administered 2017-10-03: 2 g via INTRAVENOUS
  Filled 2017-10-03: qty 100

## 2017-10-03 MED ORDER — SODIUM CHLORIDE 0.9% FLUSH: 3.0000 mL | INTRAVENOUS | Status: DC | PRN

## 2017-10-03 MED ORDER — METOPROLOL TARTRATE 12.5 MG HALF TABLET
12.5000 mg | ORAL_TABLET | Freq: Two times a day (BID) | ORAL | Status: DC
  Filled 2017-10-03 (×2): qty 1

## 2017-10-03 MED ORDER — FUROSEMIDE 40 MG PO TABS
40.0000 mg | ORAL_TABLET | Freq: Every day | ORAL | Status: DC
  Administered 2017-10-04: 40 mg via ORAL
  Filled 2017-10-03: qty 1

## 2017-10-03 MED ORDER — PNEUMOCOCCAL VAC POLYVALENT 25 MCG/0.5ML IJ INJ: 0.5000 mL | INJECTION | INTRAMUSCULAR | Status: AC | PRN

## 2017-10-03 MED ORDER — FUROSEMIDE 10 MG/ML IJ SOLN: 20.0000 mg | Freq: Every day | INTRAMUSCULAR | Status: DC

## 2017-10-03 MED ORDER — MIDAZOLAM HCL 5 MG/5ML IJ SOLN
INTRAMUSCULAR | Status: AC
  Filled 2017-10-03: qty 5

## 2017-10-03 MED ORDER — HEPARIN (PORCINE) IN NACL 2-0.9 UNIT/ML-% IJ SOLN
INTRAMUSCULAR | Status: AC | PRN
  Administered 2017-10-03: 500 mL

## 2017-10-03 MED ORDER — METOPROLOL TARTRATE 5 MG/5ML IV SOLN
INTRAVENOUS | Status: AC
  Filled 2017-10-03: qty 5

## 2017-10-03 MED ORDER — CHLORHEXIDINE GLUCONATE 4 % EX LIQD: 60.0000 mL | Freq: Once | CUTANEOUS | Status: DC

## 2017-10-03 MED ORDER — SODIUM CHLORIDE 0.9% FLUSH: 3.0000 mL | Freq: Two times a day (BID) | INTRAVENOUS | Status: DC

## 2017-10-03 MED ORDER — GENTAMICIN SULFATE 40 MG/ML IJ SOLN
INTRAMUSCULAR | Status: AC
  Filled 2017-10-03: qty 2

## 2017-10-03 MED ORDER — BUPIVACAINE HCL (PF) 0.25 % IJ SOLN
INTRAMUSCULAR | Status: AC
  Filled 2017-10-03: qty 30

## 2017-10-03 MED ORDER — MIDAZOLAM HCL 5 MG/5ML IJ SOLN
INTRAMUSCULAR | Status: DC | PRN
  Administered 2017-10-03: 1 mg via INTRAVENOUS

## 2017-10-03 MED ORDER — YOU HAVE A PACEMAKER BOOK
Freq: Once | Status: AC
  Administered 2017-10-03: 05:00:00
  Filled 2017-10-03: qty 1

## 2017-10-03 MED ORDER — SODIUM CHLORIDE 0.9 % IV SOLN: 250.0000 mL | INTRAVENOUS | Status: DC

## 2017-10-03 MED ORDER — BUPIVACAINE HCL (PF) 0.25 % IJ SOLN
INTRAMUSCULAR | Status: DC | PRN
  Administered 2017-10-03: 30 mL

## 2017-10-03 MED ORDER — SODIUM CHLORIDE 0.9 % IR SOLN
80.0000 mg | Status: AC
  Administered 2017-10-03: 80 mg
  Filled 2017-10-03: qty 2

## 2017-10-03 MED ORDER — INSULIN GLARGINE 100 UNIT/ML ~~LOC~~ SOLN
10.0000 [IU] | Freq: Two times a day (BID) | SUBCUTANEOUS | Status: DC
  Administered 2017-10-03 – 2017-10-05 (×4): 10 [IU] via SUBCUTANEOUS
  Filled 2017-10-03 (×8): qty 0.1

## 2017-10-03 MED ORDER — CEFAZOLIN SODIUM-DEXTROSE 2-4 GM/100ML-% IV SOLN
INTRAVENOUS | Status: AC
  Filled 2017-10-03: qty 100

## 2017-10-03 MED ORDER — POTASSIUM CHLORIDE IN NACL 20-0.9 MEQ/L-% IV SOLN
INTRAVENOUS | Status: DC
  Administered 2017-10-03: 15:00:00 via INTRAVENOUS
  Administered 2017-10-03: 50 mL via INTRAVENOUS
  Filled 2017-10-03 (×3): qty 1000

## 2017-10-03 SURGICAL SUPPLY — 12 items
CABLE SURGICAL S-101-97-12 (CABLE) ×4 IMPLANT
CATH RIGHTSITE C315HIS02 (CATHETERS) ×2 IMPLANT
IPG PACE AZUR XT DR MRI W1DR01 (Pacemaker) ×1 IMPLANT
LEAD CAPSURE NOVUS 5076-52CM (Lead) ×2 IMPLANT
LEAD SELECT SECURE 3830 383069 (Lead) ×1 IMPLANT
PACE AZURE XT DR MRI W1DR01 (Pacemaker) ×2 IMPLANT
PAD DEFIB LIFELINK (PAD) ×2 IMPLANT
SELECT SECURE 3830 383069 (Lead) ×2 IMPLANT
SHEATH CLASSIC 7F (SHEATH) ×4 IMPLANT
SLITTER 6232ADJ (MISCELLANEOUS) ×2 IMPLANT
TRAY PACEMAKER INSERTION (PACKS) ×2 IMPLANT
WIRE HI TORQ VERSACORE-J 145CM (WIRE) ×2 IMPLANT

## 2017-10-03 NOTE — H&P (View-Only) (Signed)
 Progress Note  Patient Name: Jonathan Brown Date of Encounter: 10/03/2017  Primary Cardiologist: Dr. Ganji  Subjective   No c/o CP, palpitations or SOB  Inpatient Medications    Scheduled Meds: . acetaminophen  1,000 mg Oral Q6H   Or  . acetaminophen (TYLENOL) oral liquid 160 mg/5 mL  1,000 mg Per Tube Q6H  . aspirin EC  325 mg Oral Daily   Or  . aspirin  324 mg Per Tube Daily  . bisacodyl  10 mg Oral Daily   Or  . bisacodyl  10 mg Rectal Daily  . docusate sodium  200 mg Oral Daily  . enoxaparin (LOVENOX) injection  40 mg Subcutaneous Q24H  . furosemide  20 mg Intravenous BID  . guaiFENesin  600 mg Oral BID  . insulin aspart  0-15 Units Subcutaneous TID WC  . insulin aspart  0-5 Units Subcutaneous QHS  . insulin glargine  14 Units Subcutaneous BID  . levalbuterol  1.25 mg Nebulization Q6H  . losartan  25 mg Oral Daily  . mouth rinse  15 mL Mouth Rinse BID  . metoCLOPramide (REGLAN) injection  10 mg Intravenous Q6H  . pantoprazole  40 mg Oral Daily  . pneumococcal 23 valent vaccine  0.5 mL Intramuscular Tomorrow-1000  . potassium chloride  20 mEq Oral BID  . pravastatin  40 mg Oral q1800  . sodium chloride flush  10-40 mL Intracatheter Q12H  . sodium chloride flush  10-40 mL Intracatheter Q12H  . sodium chloride flush  3 mL Intravenous Q12H   Continuous Infusions: . sodium chloride Stopped (09/30/17 1000)  . sodium chloride    . sodium chloride 10 mL/hr at 09/30/17 2300  . lactated ringers 20 mL/hr at 10/02/17 0800   PRN Meds: sodium chloride, ALPRAZolam, ondansetron (ZOFRAN) IV, oxyCODONE, sodium chloride flush, traMADol   Vital Signs    Vitals:   10/03/17 0200 10/03/17 0300 10/03/17 0400 10/03/17 0500  BP: (!) 89/57 (!) 141/89 126/85 95/71  Pulse: (!) 102 (!) 101 97 (!) 103  Resp: 17 (!) 23 (!) 27 19  Temp:   98.7 F (37.1 C)   TempSrc:   Oral   SpO2: 98% 97% 96% 97%  Weight:    189 lb 9.5 oz (86 kg)  Height:        Intake/Output Summary (Last  24 hours) at 10/03/17 0748 Last data filed at 10/03/17 0700  Gross per 24 hour  Intake             1470 ml  Output             1695 ml  Net             -225 ml   Filed Weights   10/01/17 0500 10/02/17 0500 10/03/17 0500  Weight: 196 lb 10.4 oz (89.2 kg) 191 lb 6.4 oz (86.8 kg) 189 lb 9.5 oz (86 kg)    Telemetry    Sinus rates 90's, Vpacing - Personally Reviewed  ECG    No new EKG - Personally Reviewed  Physical Exam   GEN: No acute distress, OOB to chair Neck: No JVD, R central IJ Cardiac: RRR, no murmurs, rubs, or gallops.  Respiratory: diminished at the bases slightly b/l GI: Soft, nontender, non-distended  MS: No edema; No deformity. Neuro:  Nonfocal  Psych: Normal affect   Labs    Chemistry Recent Labs Lab 09/27/17 0833  10/01/17 0341  10/02/17 0605 10/02/17 1715 10/03/17 0500  NA 138  < >   136  < > 138 138 137  K 3.6  < > 3.3*  < > 3.7 3.8 3.7  CL 113*  < > 107  < > 104 103 107  CO2 18*  < > 22  --  23  --  24  GLUCOSE 116*  < > 157*  < > 145* 151* 107*  BUN 10  < > 8  < > 8 15 11  CREATININE 0.94  < > 1.04  < > 0.98 0.90 0.91  CALCIUM 9.2  < > 8.4*  --  8.4*  --  8.5*  PROT 6.5  --   --   --  5.7*  --   --   ALBUMIN 3.7  --   --   --  2.8*  --   --   AST 20  --   --   --  27  --   --   ALT 24  --   --   --  20  --   --   ALKPHOS 74  --   --   --  49  --   --   BILITOT 1.3*  --   --   --  0.8  --   --   GFRNONAA >60  < > >60  --  >60  --  >60  GFRAA >60  < > >60  --  >60  --  >60  ANIONGAP 7  < > 7  --  11  --  6  < > = values in this interval not displayed.   Hematology Recent Labs Lab 10/01/17 0341  10/02/17 0605 10/02/17 1715 10/03/17 0500  WBC 14.0*  --  16.8*  --  11.8*  RBC 3.25*  --  3.23*  --  2.95*  HGB 9.5*  < > 9.5* 9.9* 8.7*  HCT 28.7*  < > 28.7* 29.0* 26.5*  MCV 88.3  --  88.9  --  89.8  MCH 29.2  --  29.4  --  29.5  MCHC 33.1  --  33.1  --  32.8  RDW 13.6  --  13.5  --  13.6  PLT 63*  --  65*  --  103*  < > = values in this  interval not displayed.  Cardiac EnzymesNo results for input(s): TROPONINI in the last 168 hours. No results for input(s): TROPIPOC in the last 168 hours.   BNPNo results for input(s): BNP, PROBNP in the last 168 hours.   DDimer No results for input(s): DDIMER in the last 168 hours.   Radiology    Dg Chest Port 1 View Result Date: 10/03/2017 CLINICAL DATA:  Status post aortic valve replacement 4 days ago. EXAM: PORTABLE CHEST 1 VIEW COMPARISON:  Portable chest x-ray of October 02, 2017 FINDINGS: The lungs are reasonably well inflated. There is hazy increased density at the right lung base with similar but less prominent findings on the left. There is no large pleural effusion and no pneumothorax. The cardiac silhouette remains enlarged. The pulmonary vascularity is slightly more conspicuous today. The sternal wires are intact. The right internal jugular Cordis sheath tip projects over the proximal SVC. There is calcification in the wall of the aortic arch. IMPRESSION: Mild increased prominence of the pulmonary interstitium bilaterally suggests low-grade interstitial edema. Hazy increased density at the lung bases greatest on the right likely reflects atelectasis. There is no large pleural effusion. Electronically Signed   By: David  Jordan M.D.   On: 10/03/2017   07:22     Cardiac Studies   09/29/17: Inter-op TEE Result status: Final result   Mitral valve: No leaflet thickening and calcification present. Mild regurgitation.  Left ventricle: Normal cavity size. Concentric hypertrophy of moderate severity. LV systolic function is mildly reduced with an EF of 45-50%. Mildly hypokinetic global wall motion No thrombus present. No mass present.  Right ventricle: Normal cavity size, wall thickness and ejection fraction. No thrombus present. No mass present.  Septum: No Patent Foramen Ovale present.  Left atrium: Patent foramen ovale not present.  Aortic valve: The valve is bicuspid. Moderate valve  thickening present. Moderate valve calcification present. Severely decreased leaflet separation. Severe stenosis. Trace regurgitation.  Tricuspid valve: Mild to moderate regurgitation.   07/12/17: LHC No obstructive CAD decrobed  Patient Profile     65 y.o. male severe AS s/p AVR (tissue) on 09/29/17, CAD, DM, HTN, HLD remains with CHB pendin pacemake implant today with Dr. Inetha Maret  Assessment & Plan    1. Persistent CHB s/p AVR POD #4     Planned for PPM today     The patient has been seen and examined today by Dr. Kapil Petropoulos, patient continues to be agreeable to proceed with PPM today  D/w RN, will need central line out, reported yesterday that CTS did not want line removed.  Asked to re-discuss with their service today prior to device would like it removed  WBC down to 11.8 (16.8 yesterday) No fever H/H 8.7/26.5 Discontinue lovenox for pacer implant >> SCD  2. Severe AS, bicuspid, no s/p AVR (tissue)     POD #4     C/w surgical service  3. HTN       For questions or updates, please contact [facility] Please consult [email or web address] for contact info under Cardiology/STEMI.      Signed, Renee Lynn Ursuy, PA-C  10/03/2017, 7:48 AM    EP Attending  Patient seen and examined. Agree with above. The patient has been stable over the weekend. He has persistent CHB, and a slow escape. I have reviewed the indications for PPM insertion and he wishes to proceed.  Jhaniya Briski,M.D.   

## 2017-10-03 NOTE — Progress Notes (Signed)
Called patient observed to be pacing 123bpm BP 146/94, patient is without complaints Discussed with MDT rep, patient was observed to have ST throughout is procedure, post implant pacer was tracking this, his upper rate limit is 130 EKG is ST w/Vpaved rhythm Will give  IV Lopressor once Agree with routine PO betablocker added on  Francis Dowse, PA-C

## 2017-10-03 NOTE — Interval H&P Note (Signed)
History and Physical Interval Note:  10/03/2017 10:45 AM  Jonathan Brown  has presented today for surgery, with the diagnosis of CHB  The various methods of treatment have been discussed with the patient and family. After consideration of risks, benefits and other options for treatment, the patient has consented to  Procedure(s): PACEMAKER IMPLANT (N/A) as a surgical intervention .  The patient's history has been reviewed, patient examined, no change in status, stable for surgery.  I have reviewed the patient's chart and labs.  Questions were answered to the patient's satisfaction.     Lewayne Bunting

## 2017-10-03 NOTE — Progress Notes (Signed)
Prepared patient for perm pacer tom, has IV site to left and right arm, chlorhexidine to chest and axilla area, will keep NPO after MN, educated patient regarding pre and post expectations of pacer, will order booklet for patient. Emotional support given to patient

## 2017-10-03 NOTE — Progress Notes (Signed)
      [address]       [name] [address]             [phone]      S/p permanent pacemaker this afternoon  BP 108/76   Pulse 97   Temp 98.6 F (37 C) (Oral)   Resp 13   Ht  (1.753 m)   Wt 189 lb 9.5 oz (86 kg)   SpO2 97%   BMI 28.00 kg/m    Intake/Output Summary (Last 24 hours) at 10/03/17 1745 Last data filed at 10/03/17 1700  Gross per 24 hour  Intake          1420.01 ml  Output             1100 ml  Net           320.01 ml    Doing well post procedure  Viviann Spare C. Dorris Fetch, MD [facility] [phone]

## 2017-10-03 NOTE — Progress Notes (Signed)
Day of Surgery Procedure(s) (LRB): PACEMAKER IMPLANT (N/A) Subjective: Doing well after aVR for severe calcific AS but with heart block Receiving transvenous permanent pacing system today by EP Will remove epicardial wires tomorrow and then patient should be able to transfer out of ICU Chest x-ray clear Incision healing well  Objective: Vital signs in last 24 hours: Temp:  [97.7 F (36.5 C)-98.7 F (37.1 C)] 98.4 F (36.9 C) (10/08 1228) Pulse Rate:  [95-105] 96 (10/08 1015) Cardiac Rhythm: A-V Sequential paced (10/08 0800) Resp:  [14-35] 21 (10/08 1015) BP: (77-167)/(50-98) 111/70 (10/08 1015) SpO2:  [95 %-100 %] 98 % (10/08 1425) Weight:  [189 lb 9.5 oz (86 kg)] 189 lb 9.5 oz (86 kg) (10/08 0500)  Hemodynamic parameters for last 24 hours:  stable  Intake/Output from previous day: 10/07 0701 - 10/08 0700 In: 1470 [P.O.:540; I.V.:480; IV Piggyback:100] Out: 1695 [Urine:1695] Intake/Output this shift: Total I/O In: 40 [I.V.:40] Out: 325 [Urine:325]       Exam    General- alert and comfortable   Lungs- clear without rales, wheezes   Cor- regular rate and rhythm, no murmur , gallop   Abdomen- soft, non-tender   Extremities - warm, non-tender, minimal edema   Neuro- oriented, appropriate, no focal weakness   Lab Results:  Recent Labs  10/02/17 0605 10/02/17 1715 10/03/17 0500  WBC 16.8*  --  11.8*  HGB 9.5* 9.9* 8.7*  HCT 28.7* 29.0* 26.5*  PLT 65*  --  103*   BMET:  Recent Labs  10/02/17 0605 10/02/17 1715 10/03/17 0500  NA 138 138 137  K 3.7 3.8 3.7  CL 104 103 107  CO2 23  --  24  GLUCOSE 145* 151* 107*  BUN CREATININE 0.98 0.90 0.91  CALCIUM 8.4*  --  8.5*    PT/INR: No results for input(s): LABPROT, INR in the last 72 hours. ABG    Component Value Date/Time   PHART 7.345 (L) 09/29/2017 2050   HCO3 19.6 (L) 09/29/2017 2050   TCO2 23 10/02/2017 1715   ACIDBASEDEF 5.0 (H) 09/29/2017 2050   O2SAT 96.0 09/29/2017 2050   CBG (last  3)   Recent Labs  10/02/17 2108 10/03/17 0804 10/03/17 1227  GLUCAP 122* 121* 103*    Assessment/Plan: S/P Procedure(s) (LRB): PACEMAKER IMPLANT (N/A) Mobilize Diuresis Diabetes control The need for permanent pacemaker discussed with patient and wife including the known risk of heart block after aVR.   LOS: 4 days    Kathlee Nations Trigt III 10/03/2017

## 2017-10-03 NOTE — Progress Notes (Signed)
Progress Note  Patient Name: Jonathan Brown Date of Encounter: 10/03/2017  Primary Cardiologist: Dr. Jacinto Halim  Subjective   No c/o CP, palpitations or SOB  Inpatient Medications    Scheduled Meds: . acetaminophen  1,000 mg Oral Q6H   Or  . acetaminophen (TYLENOL) oral liquid 160 mg/5 mL  1,000 mg Per Tube Q6H  . aspirin EC  325 mg Oral Daily   Or  . aspirin  324 mg Per Tube Daily  . bisacodyl  10 mg Oral Daily   Or  . bisacodyl  10 mg Rectal Daily  . docusate sodium  200 mg Oral Daily  . enoxaparin (LOVENOX) injection  40 mg Subcutaneous Q24H  . furosemide  20 mg Intravenous BID  . guaiFENesin  600 mg Oral BID  . insulin aspart  0-15 Units Subcutaneous TID WC  . insulin aspart  0-5 Units Subcutaneous QHS  . insulin glargine  14 Units Subcutaneous BID  . levalbuterol  1.25 mg Nebulization Q6H  . losartan  25 mg Oral Daily  . mouth rinse  15 mL Mouth Rinse BID  . metoCLOPramide (REGLAN) injection  10 mg Intravenous Q6H  . pantoprazole  40 mg Oral Daily  . pneumococcal 23 valent vaccine  0.5 mL Intramuscular Tomorrow-1000  . potassium chloride  20 mEq Oral BID  . pravastatin  40 mg Oral q1800  . sodium chloride flush  10-40 mL Intracatheter Q12H  . sodium chloride flush  10-40 mL Intracatheter Q12H  . sodium chloride flush  3 mL Intravenous Q12H   Continuous Infusions: . sodium chloride Stopped (09/30/17 1000)  . sodium chloride    . sodium chloride 10 mL/hr at 09/30/17 2300  . lactated ringers 20 mL/hr at 10/02/17 0800   PRN Meds: sodium chloride, ALPRAZolam, ondansetron (ZOFRAN) IV, oxyCODONE, sodium chloride flush, traMADol   Vital Signs    Vitals:   10/03/17 0200 10/03/17 0300 10/03/17 0400 10/03/17 0500  BP: (!) 89/57 (!) 141/89 126/85 95/71  Pulse: (!) 102 (!) 101 97 (!) 103  Resp: 17 (!) 23 (!) 27 19  Temp:   98.7 F (37.1 C)   TempSrc:   Oral   SpO2: 98% 97% 96% 97%  Weight:    189 lb 9.5 oz (86 kg)  Height:        Intake/Output Summary (Last  24 hours) at 10/03/17 0748 Last data filed at 10/03/17 0700  Gross per 24 hour  Intake             1470 ml  Output             1695 ml  Net             -225 ml   Filed Weights   10/01/17 0500 10/02/17 0500 10/03/17 0500  Weight: 196 lb 10.4 oz (89.2 kg) 191 lb 6.4 oz (86.8 kg) 189 lb 9.5 oz (86 kg)    Telemetry    Sinus rates 90's, Vpacing - Personally Reviewed  ECG    No new EKG - Personally Reviewed  Physical Exam   GEN: No acute distress, OOB to chair Neck: No JVD, R central IJ Cardiac: RRR, no murmurs, rubs, or gallops.  Respiratory: diminished at the bases slightly b/l GI: Soft, nontender, non-distended  MS: No edema; No deformity. Neuro:  Nonfocal  Psych: Normal affect   Labs    Chemistry Recent Labs Lab 09/27/17 2952  10/01/17 0341  10/02/17 0605 10/02/17 1715 10/03/17 0500  NA 138  < >  136  < > 138 138 137  K 3.6  < > 3.3*  < > 3.7 3.8 3.7  CL 113*  < > 107  < > 104 103 107  CO2 18*  < > 22  --  23  --  24  GLUCOSE 116*  < > 157*  < > 145* 151* 107*  BUN 10  < > 8  < > CREATININE 0.94  < > 1.04  < > 0.98 0.90 0.91  CALCIUM 9.2  < > 8.4*  --  8.4*  --  8.5*  PROT 6.5  --   --   --  5.7*  --   --   ALBUMIN 3.7  --   --   --  2.8*  --   --   AST 20  --   --   --  27  --   --   ALT 24  --   --   --  20  --   --   ALKPHOS 74  --   --   --  49  --   --   BILITOT 1.3*  --   --   --  0.8  --   --   GFRNONAA >60  < > >60  --  >60  --  >60  GFRAA >60  < > >60  --  >60  --  >60  ANIONGAP 7  < > 7  --  11  --  6  < > = values in this interval not displayed.   Hematology Recent Labs Lab 10/01/17 0341  10/02/17 0605 10/02/17 1715 10/03/17 0500  WBC 14.0*  --  16.8*  --  11.8*  RBC 3.25*  --  3.23*  --  2.95*  HGB 9.5*  < > 9.5* 9.9* 8.7*  HCT 28.7*  < > 28.7* 29.0* 26.5*  MCV 88.3  --  88.9  --  89.8  MCH 29.2  --  29.4  --  29.5  MCHC 33.1  --  33.1  --  32.8  RDW 13.6  --  13.5  --  13.6  PLT 63*  --  65*  --  103*  < > = values in this  interval not displayed.  Cardiac EnzymesNo results for input(s): TROPONINI in the last 168 hours. No results for input(s): TROPIPOC in the last 168 hours.   BNPNo results for input(s): BNP, PROBNP in the last 168 hours.   DDimer No results for input(s): DDIMER in the last 168 hours.   Radiology    Dg Chest Port 1 View Result Date: 10/03/2017 CLINICAL DATA:  Status post aortic valve replacement 4 days ago. EXAM: PORTABLE CHEST 1 VIEW COMPARISON:  Portable chest x-ray of October 02, 2017 FINDINGS: The lungs are reasonably well inflated. There is hazy increased density at the right lung base with similar but less prominent findings on the left. There is no large pleural effusion and no pneumothorax. The cardiac silhouette remains enlarged. The pulmonary vascularity is slightly more conspicuous today. The sternal wires are intact. The right internal jugular Cordis sheath tip projects over the proximal SVC. There is calcification in the wall of the aortic arch. IMPRESSION: Mild increased prominence of the pulmonary interstitium bilaterally suggests low-grade interstitial edema. Hazy increased density at the lung bases greatest on the right likely reflects atelectasis. There is no large pleural effusion. Electronically Signed   By: David  [place] M.D.   On: 10/03/2017  07:22     Cardiac Studies   09/29/17: Inter-op TEE Result status: Final result   Mitral valve: No leaflet thickening and calcification present. Mild regurgitation.  Left ventricle: Normal cavity size. Concentric hypertrophy of moderate severity. LV systolic function is mildly reduced with an EF of 45-50%. Mildly hypokinetic global wall motion No thrombus present. No mass present.  Right ventricle: Normal cavity size, wall thickness and ejection fraction. No thrombus present. No mass present.  Septum: No Patent Foramen Ovale present.  Left atrium: Patent foramen ovale not present.  Aortic valve: The valve is bicuspid. Moderate valve  thickening present. Moderate valve calcification present. Severely decreased leaflet separation. Severe stenosis. Trace regurgitation.  Tricuspid valve: Mild to moderate regurgitation.   07/12/17: LHC No obstructive CAD decrobed  Patient Profile     66 y.o. male severe AS s/p AVR (tissue) on 09/29/17, CAD, DM, HTN, HLD remains with CHB pendin pacemake implant today with Dr. Ladona Ridgel  Assessment & Plan    1. Persistent CHB s/p AVR POD #4     Planned for PPM today     The patient has been seen and examined today by Dr. Ladona Ridgel, patient continues to be agreeable to proceed with PPM today  D/w RN, will need central line out, reported yesterday that CTS did not want line removed.  Asked to re-discuss with their service today prior to device would like it removed  WBC down to 11.8 (16.8 yesterday) No fever H/H 8.7/26.5 Discontinue lovenox for pacer implant >> SCD  2. Severe AS, bicuspid, no s/p AVR (tissue)     POD #4     C/w surgical service  3. HTN       For questions or updates, please contact [facility] Please consult [email or web address] for contact info under Cardiology/STEMI.      Signed, Sheilah Pigeon, PA-C  10/03/2017, 7:48 AM    EP Attending  Patient seen and examined. Agree with above. The patient has been stable over the weekend. He has persistent CHB, and a slow escape. I have reviewed the indications for PPM insertion and he wishes to proceed.  Leonia Reeves.D.

## 2017-10-04 ENCOUNTER — Inpatient Hospital Stay (HOSPITAL_COMMUNITY): Payer: Medicare HMO

## 2017-10-04 ENCOUNTER — Encounter (HOSPITAL_COMMUNITY): Payer: Self-pay | Admitting: Internal Medicine

## 2017-10-04 LAB — GLUCOSE, CAPILLARY
Glucose-Capillary: 89 mg/dL (ref 65–99)
Glucose-Capillary: 127 mg/dL — ABNORMAL HIGH (ref 65–99)
Glucose-Capillary: 135 mg/dL — ABNORMAL HIGH (ref 65–99)

## 2017-10-04 LAB — VAS US DOPPLER PRE CABG
LEFT ECA DIAS: -5 cm/s
LEFT VERTEBRAL DIAS: -12 cm/s
Left CCA dist dias: -15 cm/s
Left CCA dist sys: -73 cm/s
Left CCA prox dias: 12 cm/s
Left CCA prox sys: 79 cm/s
Left ICA dist dias: -16 cm/s
Left ICA dist sys: -54 cm/s
Left ICA prox dias: -9 cm/s
Left ICA prox sys: -65 cm/s
RIGHT ECA DIAS: -11 cm/s
RIGHT VERTEBRAL DIAS: -15 cm/s
Right CCA prox dias: 16 cm/s
Right CCA prox sys: 68 cm/s
Right cca dist sys: -68 cm/s

## 2017-10-04 MED ORDER — SODIUM CHLORIDE 0.9% FLUSH: 3.0000 mL | INTRAVENOUS | Status: DC | PRN

## 2017-10-04 MED ORDER — CEFAZOLIN SODIUM-DEXTROSE 1-4 GM/50ML-% IV SOLN
1.0000 g | Freq: Four times a day (QID) | INTRAVENOUS | Status: AC
  Administered 2017-10-04 – 2017-10-05 (×3): 1 g via INTRAVENOUS
  Filled 2017-10-04 (×3): qty 50

## 2017-10-04 MED ORDER — MAGNESIUM HYDROXIDE 400 MG/5ML PO SUSP: 30.0000 mL | Freq: Every day | ORAL | Status: DC | PRN

## 2017-10-04 MED ORDER — GUAIFENESIN ER 600 MG PO TB12: 600.0000 mg | ORAL_TABLET | Freq: Two times a day (BID) | ORAL | Status: DC | PRN

## 2017-10-04 MED ORDER — SODIUM CHLORIDE 0.9 % IV SOLN: 250.0000 mL | INTRAVENOUS | Status: DC | PRN

## 2017-10-04 MED ORDER — CEFAZOLIN SODIUM-DEXTROSE 2-4 GM/100ML-% IV SOLN
2.0000 g | Freq: Once | INTRAVENOUS | Status: AC
Start: 2017-10-04 — End: 2017-10-04
  Administered 2017-10-04: 2 g via INTRAVENOUS
  Filled 2017-10-04: qty 100

## 2017-10-04 MED ORDER — MOVING RIGHT ALONG BOOK
Freq: Once | Status: AC
  Administered 2017-10-04: 09:00:00
  Filled 2017-10-04: qty 1

## 2017-10-04 MED ORDER — ONDANSETRON HCL 4 MG/2ML IJ SOLN: 4.0000 mg | Freq: Four times a day (QID) | INTRAMUSCULAR | Status: DC | PRN

## 2017-10-04 MED ORDER — LEVALBUTEROL HCL 1.25 MG/0.5ML IN NEBU
1.2500 mg | INHALATION_SOLUTION | Freq: Three times a day (TID) | RESPIRATORY_TRACT | Status: DC
  Administered 2017-10-04 – 2017-10-05 (×3): 1.25 mg via RESPIRATORY_TRACT
  Filled 2017-10-04 (×4): qty 0.5

## 2017-10-04 MED ORDER — ACETAMINOPHEN 325 MG PO TABS: 325.0000 mg | ORAL_TABLET | ORAL | Status: DC | PRN

## 2017-10-04 MED ORDER — LEVALBUTEROL HCL 0.63 MG/3ML IN NEBU: 0.6300 mg | INHALATION_SOLUTION | Freq: Four times a day (QID) | RESPIRATORY_TRACT | Status: DC | PRN

## 2017-10-04 MED ORDER — SODIUM CHLORIDE 0.9% FLUSH
3.0000 mL | Freq: Two times a day (BID) | INTRAVENOUS | Status: DC
  Administered 2017-10-04 – 2017-10-05 (×2): 3 mL via INTRAVENOUS

## 2017-10-04 MED ORDER — CEFAZOLIN SODIUM-DEXTROSE 1-4 GM/50ML-% IV SOLN: 1.0000 g | Freq: Four times a day (QID) | INTRAVENOUS | Status: DC

## 2017-10-04 MED ORDER — METOPROLOL TARTRATE 25 MG PO TABS
25.0000 mg | ORAL_TABLET | Freq: Two times a day (BID) | ORAL | Status: DC
  Administered 2017-10-04 (×2): 25 mg via ORAL
  Filled 2017-10-04 (×3): qty 1

## 2017-10-04 MED FILL — Magnesium Sulfate Inj 50%: INTRAMUSCULAR | Qty: 10 | Status: AC

## 2017-10-04 MED FILL — Potassium Chloride Inj 2 mEq/ML: INTRAVENOUS | Qty: 40 | Status: AC

## 2017-10-04 MED FILL — Heparin Sodium (Porcine) Inj 1000 Unit/ML: INTRAMUSCULAR | Qty: 30 | Status: AC

## 2017-10-04 MED FILL — Bupivacaine HCl Preservative Free (PF) Inj 0.25%: INTRAMUSCULAR | Qty: 30 | Status: AC

## 2017-10-04 MED FILL — Fentanyl Citrate Preservative Free (PF) Inj 100 MCG/2ML: INTRAMUSCULAR | Qty: 2 | Status: AC

## 2017-10-04 NOTE — Progress Notes (Signed)
Pt arrived to 4e from 2h. Vitals obtained. Pt oriented to room and staff. Telemetry box applied and CCMD notified x2. Pt denies needs at this time. Will continue current plan of care.  Berdine Dance BSN, RN

## 2017-10-04 NOTE — Progress Notes (Signed)
Orthopedic Tech Progress Note Patient Details:  Jonathan Brown [birth year] [id number]  Ortho Devices Ortho Device/Splint Location: floor nurse stated pt does not need arm sling at this time.   time: 1700 tuesday 10/04/17   [name] 10/04/2017, 5:00 PM

## 2017-10-04 NOTE — Progress Notes (Signed)
1 Day Post-Op Procedure(s) (LRB): PACEMAKER IMPLANT (N/A) Subjective: Pacemaker working well, intrinsic atrial rate greater than 100 and beta blockers started-Lopressor 25 mg twice a day Post pacemaker chest x-ray clear Patient ready for transfer to step down Weight almost back to preop Incision clean and dry  Objective: Vital signs in last 24 hours: Temp:  [98.1 F (36.7 C)-98.9 F (37.2 C)] 98.1 F (36.7 C) (10/09 1151) Pulse Rate:  [97-124] 103 (10/09 1400) Cardiac Rhythm: Ventricular paced (10/09 1400) Resp:  [13-30] 22 (10/09 1300) BP: (95-167)/(62-102) 138/80 (10/09 1400) SpO2:  [93 %-99 %] 96 % (10/09 1400) Weight:  [190 lb 11.2 oz (86.5 kg)] 190 lb 11.2 oz (86.5 kg) (10/09 0600)  Hemodynamic parameters for last 24 hours:  stable  Intake/Output from previous day: 10/08 0701 - 10/09 0700 In: 1090 [I.V.:1090] Out: 1605 [Urine:1605] Intake/Output this shift: Total I/O In: 670.7 [P.O.:480; I.V.:190.7] Out: 700 [Urine:700]       Exam    General- alert and comfortable   Lungs- clear without rales, wheezes   Cor- regular rate and rhythm, no murmur , gallop   Abdomen- soft, non-tender   Extremities - warm, non-tender, minimal edema   Neuro- oriented, appropriate, no focal weakness   Lab Results:  Recent Labs  10/02/17 0605 10/02/17 1715 10/03/17 0500  WBC 16.8*  --  11.8*  HGB 9.5* 9.9* 8.7*  HCT 28.7* 29.0* 26.5*  PLT 65*  --  103*   BMET:  Recent Labs  10/02/17 0605 10/02/17 1715 10/03/17 0500  NA 138 138 137  K 3.7 3.8 3.7  CL 104 103 107  CO2 23  --  24  GLUCOSE 145* 151* 107*  BUN CREATININE 0.98 0.90 0.91  CALCIUM 8.4*  --  8.5*    PT/INR: No results for input(s): LABPROT, INR in the last 72 hours. ABG    Component Value Date/Time   PHART 7.345 (L) 09/29/2017 2050   HCO3 19.6 (L) 09/29/2017 2050   TCO2 23 10/02/2017 1715   ACIDBASEDEF 5.0 (H) 09/29/2017 2050   O2SAT 96.0 09/29/2017 2050   CBG (last 3)   Recent Labs  10/03/17 2122 10/04/17 0745 10/04/17 1149  GLUCAP 111* 89 127*    Assessment/Plan: S/P Procedure(s) (LRB): PACEMAKER IMPLANT (N/A) Mobilize Diuresis Diabetes control Plan for transfer to step-down: see transfer orders DC EPWs in a.m.   LOS: 5 days    Kathlee Nations Trigt III 10/04/2017

## 2017-10-04 NOTE — Progress Notes (Signed)
Progress Note  Patient Name: Jonathan Brown Date of Encounter: 10/04/2017  Primary Cardiologist: Dr. Jacinto Halim  Subjective   No c/o CP, palpitations or SOB  Inpatient Medications    Scheduled Meds: . acetaminophen  1,000 mg Oral Q6H   Or  . acetaminophen (TYLENOL) oral liquid 160 mg/5 mL  1,000 mg Per Tube Q6H  . aspirin EC  325 mg Oral Daily   Or  . aspirin  324 mg Per Tube Daily  . bisacodyl  10 mg Oral Daily   Or  . bisacodyl  10 mg Rectal Daily  . docusate sodium  200 mg Oral Daily  . enoxaparin (LOVENOX) injection  40 mg Subcutaneous Q24H  . furosemide  40 mg Oral Daily  . guaiFENesin  600 mg Oral BID  . insulin aspart  0-15 Units Subcutaneous TID WC  . insulin aspart  0-5 Units Subcutaneous QHS  . insulin glargine  10 Units Subcutaneous BID  . levalbuterol  1.25 mg Nebulization TID  . losartan  25 mg Oral Daily  . mouth rinse  15 mL Mouth Rinse BID  . metoprolol tartrate  25 mg Oral BID  . pantoprazole  40 mg Oral Daily  . potassium chloride  20 mEq Oral BID  . pravastatin  40 mg Oral q1800  . sodium chloride flush  3 mL Intravenous Q12H  . sodium chloride flush  3 mL Intravenous Q12H   Continuous Infusions: . sodium chloride Stopped (09/30/17 1000)  . sodium chloride    . sodium chloride 10 mL/hr at 09/30/17 2300  . sodium chloride    . 0.9 % NaCl with KCl 20 mEq / L 50 mL/hr at 10/04/17 1300  . lactated ringers Stopped (10/03/17 0900)   PRN Meds: sodium chloride, sodium chloride, ALPRAZolam, guaiFENesin, levalbuterol, magnesium hydroxide, ondansetron (ZOFRAN) IV, oxyCODONE, sodium chloride flush, sodium chloride flush, traMADol   Vital Signs    Vitals:   10/04/17 1151 10/04/17 1200 10/04/17 1300 10/04/17 1339  BP:  (!) 157/88 (!) 167/102   Pulse:  (!) 102 (!) 106   Resp:  (!) 21 (!) 22   Temp: 98.1 F (36.7 C)     TempSrc: Oral     SpO2:  97% 99% 99%  Weight:      Height:        Intake/Output Summary (Last 24 hours) at 10/04/17 1410 Last  data filed at 10/04/17 1300  Gross per 24 hour  Intake          1365.84 ml  Output             1980 ml  Net          -614.16 ml   Filed Weights   10/02/17 0500 10/03/17 0500 10/04/17 0600  Weight: 191 lb 6.4 oz (86.8 kg) 189 lb 9.5 oz (86 kg) 190 lb 11.2 oz (86.5 kg)    Telemetry    SR/ST generally 90's-110 w/V paced rhythm - Personally Reviewed  ECG    No new EKG - Personally Reviewed  Physical Exam   GEN: No acute distress, OOB to chair Neck: No JVD, R central IJ Cardiac: RRR, no murmurs, rubs, or gallops.  Respiratory: diminished at the bases slightly b/l GI: Soft, nontender, non-distended  MS: No edema; No deformity. Neuro:  Nonfocal  Psych: Normal affect   Labs    Chemistry Recent Labs Lab 10/01/17 0341  10/02/17 0605 10/02/17 1715 10/03/17 0500  NA 136  < > 138 138 137  K 3.3*  < > 3.7 3.8 3.7  CL 107  < > 104 103 107  CO2 22  --  23  --  24  GLUCOSE 157*  < > 145* 151* 107*  BUN 8  < > CREATININE 1.04  < > 0.98 0.90 0.91  CALCIUM 8.4*  --  8.4*  --  8.5*  PROT  --   --  5.7*  --   --   ALBUMIN  --   --  2.8*  --   --   AST  --   --  27  --   --   ALT  --   --  20  --   --   ALKPHOS  --   --  49  --   --   BILITOT  --   --  0.8  --   --   GFRNONAA >60  --  >60  --  >60  GFRAA >60  --  >60  --  >60  ANIONGAP 7  --  11  --  6  < > = values in this interval not displayed.   Hematology Recent Labs Lab 10/01/17 0341  10/02/17 0605 10/02/17 1715 10/03/17 0500  WBC 14.0*  --  16.8*  --  11.8*  RBC 3.25*  --  3.23*  --  2.95*  HGB 9.5*  < > 9.5* 9.9* 8.7*  HCT 28.7*  < > 28.7* 29.0* 26.5*  MCV 88.3  --  88.9  --  89.8  MCH 29.2  --  29.4  --  29.5  MCHC 33.1  --  33.1  --  32.8  RDW 13.6  --  13.5  --  13.6  PLT 63*  --  65*  --  103*  < > = values in this interval not displayed.  Cardiac EnzymesNo results for input(s): TROPONINI in the last 168 hours. No results for input(s): TROPIPOC in the last 168 hours.   BNPNo results for  input(s): BNP, PROBNP in the last 168 hours.   DDimer No results for input(s): DDIMER in the last 168 hours.   Radiology    Dg Chest Port 1 View Result Date: 10/03/2017 CLINICAL DATA:  Status post aortic valve replacement 4 days ago. EXAM: PORTABLE CHEST 1 VIEW COMPARISON:  Portable chest x-ray of October 02, 2017 FINDINGS: The lungs are reasonably well inflated. There is hazy increased density at the right lung base with similar but less prominent findings on the left. There is no large pleural effusion and no pneumothorax. The cardiac silhouette remains enlarged. The pulmonary vascularity is slightly more conspicuous today. The sternal wires are intact. The right internal jugular Cordis sheath tip projects over the proximal SVC. There is calcification in the wall of the aortic arch. IMPRESSION: Mild increased prominence of the pulmonary interstitium bilaterally suggests low-grade interstitial edema. Hazy increased density at the lung bases greatest on the right likely reflects atelectasis. There is no large pleural effusion. Electronically Signed   By: David  [place] M.D.   On: 10/03/2017 07:22     Cardiac Studies   09/29/17: Inter-op TEE Result status: Final result   Mitral valve: No leaflet thickening and calcification present. Mild regurgitation.  Left ventricle: Normal cavity size. Concentric hypertrophy of moderate severity. LV systolic function is mildly reduced with an EF of 45-50%. Mildly hypokinetic global wall motion No thrombus present. No mass present.  Right ventricle: Normal cavity size, wall thickness and ejection fraction.  No thrombus present. No mass present.  Septum: No Patent Foramen Ovale present.  Left atrium: Patent foramen ovale not present.  Aortic valve: The valve is bicuspid. Moderate valve thickening present. Moderate valve calcification present. Severely decreased leaflet separation. Severe stenosis. Trace regurgitation.  Tricuspid valve: Mild to moderate  regurgitation.   07/12/17: LHC No obstructive CAD decrobed  Patient Profile     66 y.o. male severe AS s/p AVR (tissue) on 09/29/17, CAD, DM, HTN, HLD remains with CHB s/p PPM implant yesterday with Dr. Ladona Ridgel  Assessment & Plan    1. Persistent CHB s/p AVR POD #4     PPM implant yesterday     Site is stable without hematoma or ecchymosis, no bleeding     Device check today with intact function     CXR without ptx     Wound care instructions and activity restrictions were discussed with the patient and wife at bedside     Follow up has been arranged       2. Severe AS, bicuspid, no s/p AVR (tissue)     POD #5     C/w surgical service  3. HTN  4. Sinus tach     BB management deferred to CTS service  EP attending  Patient seen and examined. Agree with the findings as noted above. He has undergone pacemaker interrogation under my direct supervision, and his current device is working normally. He will be follow-up in our office in approximately 2 weeks for pacemaker check and back with me in several months.  Lewayne Bunting, M.D.   EP service remains available, please recall if needed.         For questions or updates, please contact [facility] Please consult [email or web address] for contact info under Cardiology/STEMI.      Signed, Sheilah Pigeon, PA-C  10/04/2017, 2:10 PM    EP Attending  Patient seen and examined. Agree with above. The patient has been stable over the weekend. He has persistent CHB, and a slow escape. I have reviewed the indications for PPM insertion and he wishes to proceed.  Leonia Reeves.D.

## 2017-10-05 DIAGNOSIS — Z950 Presence of cardiac pacemaker: Secondary | ICD-10-CM

## 2017-10-05 DIAGNOSIS — I442 Atrioventricular block, complete: Secondary | ICD-10-CM

## 2017-10-05 HISTORY — DX: Atrioventricular block, complete: I44.2

## 2017-10-05 LAB — GLUCOSE, CAPILLARY
Glucose-Capillary: 130 mg/dL — ABNORMAL HIGH (ref 65–99)
Glucose-Capillary: 102 mg/dL — ABNORMAL HIGH (ref 65–99)

## 2017-10-05 LAB — BASIC METABOLIC PANEL
Anion gap: 11 (ref 5–15)
BUN: 18 mg/dL (ref 6–20)
CO2: 22 mmol/L (ref 22–32)
Calcium: 9.2 mg/dL (ref 8.9–10.3)
Chloride: 106 mmol/L (ref 101–111)
Creatinine, Ser: 1.02 mg/dL (ref 0.61–1.24)
GFR calc Af Amer: >60 mL/min (ref >60)
GFR calc non Af Amer: >60 mL/min (ref >60)
Glucose, Bld: 114 mg/dL — ABNORMAL HIGH (ref 65–99)
Potassium: 4.3 mmol/L (ref 3.5–5.1)
Sodium: 139 mmol/L (ref 135–145)

## 2017-10-05 LAB — CBC
HCT: 30.5 % — ABNORMAL LOW (ref 39.0–52.0)
Hemoglobin: 9.8 g/dL — ABNORMAL LOW (ref 13.0–17.0)
MCH: 29.4 pg (ref 26.0–34.0)
MCHC: 32.1 g/dL (ref 30.0–36.0)
MCV: 91.6 fL (ref 78.0–100.0)
Platelets: 177 10*3/uL (ref 150–400)
RBC: 3.33 MIL/uL — ABNORMAL LOW (ref 4.22–5.81)
RDW: 13.3 % (ref 11.5–15.5)
WBC: 11.7 10*3/uL — ABNORMAL HIGH (ref 4.0–10.5)

## 2017-10-05 MED ORDER — METOPROLOL TARTRATE 50 MG PO TABS
50.0000 mg | ORAL_TABLET | Freq: Two times a day (BID) | ORAL | Status: DC
Start: 2017-10-05 — End: 2017-10-06
  Administered 2017-10-05 (×2): 50 mg via ORAL
  Filled 2017-10-05 (×3): qty 1

## 2017-10-05 MED ORDER — FUROSEMIDE 20 MG PO TABS
20.0000 mg | ORAL_TABLET | Freq: Every day | ORAL | Status: DC
  Administered 2017-10-05 – 2017-10-06 (×2): 20 mg via ORAL
  Filled 2017-10-05 (×2): qty 1

## 2017-10-05 MED ORDER — LOSARTAN POTASSIUM 50 MG PO TABS
50.0000 mg | ORAL_TABLET | Freq: Every day | ORAL | Status: DC
  Filled 2017-10-05: qty 1

## 2017-10-05 MED ORDER — LOSARTAN POTASSIUM 25 MG PO TABS: 25.0000 mg | ORAL_TABLET | Freq: Every day | ORAL | Status: DC

## 2017-10-05 MED ORDER — POTASSIUM CHLORIDE CRYS ER 20 MEQ PO TBCR
20.0000 meq | EXTENDED_RELEASE_TABLET | Freq: Every day | ORAL | Status: DC
  Administered 2017-10-05 – 2017-10-06 (×2): 20 meq via ORAL
  Filled 2017-10-05 (×2): qty 1

## 2017-10-05 NOTE — Care Management Important Message (Signed)
Important Message  Patient Details  Name: Jonathan Brown MRN: [id number] Date of Birth: November 03, 1951   Medicare Important Message Given:  Yes    Taylar Hartsough Abena 10/05/2017, 10:01 AM

## 2017-10-05 NOTE — Discharge Summary (Signed)
Physician Discharge Summary  Patient ID: Jonathan Brown MRN: [id number] DOB/AGE: May 21, 1951 66 y.o.  Admit date: 09/29/2017 Discharge date: 10/06/2017  Admission Diagnoses:  Patient Active Problem List   Diagnosis Date Noted  . 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 Other fatigue 06/14/2017  . Pleural plaque 06/14/2017  . Screen for colon cancer 06/14/2017  . Former smoker 06/14/2017  . Pneumococcal vaccination declined 03/14/2017  . Vaccine counseling 03/14/2017  . Polyuria 11/04/2016  . Nocturia 11/04/2016  . Medicare annual wellness visit, initial 11/04/2016  . Abnormal chest CT 11/04/2016  . Poor diet 11/04/2016  . Noncompliance 11/04/2016  . Diabetic mononeuropathy associated with type 2 diabetes mellitus (HCC) 02/03/2016  . Gastroesophageal reflux disease without esophagitis 02/03/2016  . Hyperlipidemia 02/03/2016  . Essential hypertension 02/03/2016  . Type 2 diabetes mellitus with complication, without long-term current use of insulin (HCC) 02/03/2016  . Need for prophylactic vaccination and inoculation against influenza 02/03/2016  . Coronary artery disease involving native coronary artery of native heart with angina pectoris (HCC) 02/03/2016  . Atherosclerosis of native artery of both lower extremities with intermittent claudication (HCC) 02/03/2016  . Aortic stenosis 02/03/2016   Discharge Diagnoses:   Patient Active Problem List   Diagnosis Date Noted  . CHB (complete heart block) (HCC) 10/05/2017  . S/P placement of cardiac pacemaker 10/05/2017  . S/P AVR (aortic valve replacement) 09/29/2017  . 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 Other fatigue 06/14/2017  . Pleural plaque 06/14/2017  . Screen for colon cancer 06/14/2017  . Former smoker 06/14/2017  . Pneumococcal vaccination declined 03/14/2017  . Vaccine counseling 03/14/2017  . Polyuria 11/04/2016  . Nocturia 11/04/2016  . Medicare annual wellness visit, initial 11/04/2016  . Abnormal chest CT  11/04/2016  . Poor diet 11/04/2016  . Noncompliance 11/04/2016  . Diabetic mononeuropathy associated with type 2 diabetes mellitus (HCC) 02/03/2016  . Gastroesophageal reflux disease without esophagitis 02/03/2016  . Hyperlipidemia 02/03/2016  . Essential hypertension 02/03/2016  . Type 2 diabetes mellitus with complication, without long-term current use of insulin (HCC) 02/03/2016  . Need for prophylactic vaccination and inoculation against influenza 02/03/2016  . Coronary artery disease involving native coronary artery of native heart with angina pectoris (HCC) 02/03/2016  . Atherosclerosis of native artery of both lower extremities with intermittent claudication (HCC) 02/03/2016  . Aortic stenosis 02/03/2016   Discharged Condition: good  History of Present Illness:  Jonathan Brown is a 40AA yo male with known symptomatic Aortic Stenosis due to bicuspid valve.  He has been routinely followed by [facility] with most recent Echocardiogram showing a significant increase in transvalavular gradient.  The patient also was experiencing exertional dyspnea and reduced exercise tolerance.  It was felt surgical intervention would be indicated and he was referred to [facility] for evaluation.  He saw Dr. Donata Clay who recommended he undergo Aortic Valve Replacement.  The risks and benefits of the procedure were explained to the patient and he was agreeable to proceed.  He would require dental clearance prior to proceeding with surgery.   Hospital Course:   Jonathan Brown presented to [facility] with known severe asymptomatic aortic stenosis. He went to the OR on 09/29/2017 for an Aortic valve replacement with Dr. Donata Clay. He tolerated the procedure well and was transferred to the CVICU. He was extubated in a timely manner. POD 1 we began to mobilize the patient and started a diuretic regimen. We continued to work on blood glucose control. The patient  was AV paced at 80bpm. His pulmonary symptoms  improved with a bronchodilator and Mucinex. POD 3 he was walking in the halls. His rhythm was slow and nodal at 54 bpm. Electrophysiology was consulted for PPM placement. PPM placement on 10/8. The device was interrogated on 10/9 without issues. BB was started on 10/9 with success. His rate however did continue to climb and on 10/10 was increased to  BID. We continued to monitor his blood pressure closely and titrated Cozaar as needed. He continued a diuretic regimen until his baseline weight was reached. His EPW were discontinued on 10/10. Today, he is ambulating with limited assistance, his incisions are healing well, he is tolerating room air, and he is ready for discharge.   Consults: electrophysiology, Cardiology  Significant Diagnostic Studies:  CLINICAL DATA:  Status post pacemaker placement.  Initial encounter.  EXAM: CHEST  2 VIEW  COMPARISON:  Chest radiograph performed earlier today at 8:13 a.m.  FINDINGS: The patient's left-sided pacemaker is grossly unchanged in appearance, with leads ending overlying the right atrium and right ventricle. No pneumothorax is seen.  Small bilateral pleural effusions are noted.  The heart is normal in size. The patient is status post median sternotomy. A valve replacement is noted. No acute osseous abnormalities are identified.  IMPRESSION: Left-sided pacemaker is grossly unremarkable in appearance. No evidence of pneumothorax. Small bilateral pleural effusions noted.   Electronically Signed   By: Roanna Raider M.D.   On: 10/04/2017 22:08  Treatments: NAME:  CUAHUTEMOC, ATTAR             ACCOUNT NO.:  [id number]  MEDICAL RECORD NO.:  [id number]  LOCATION:                                 FACILITY:  PHYSICIAN:  Kerin Perna, M.D.       DATE OF BIRTH:  DATE OF PROCEDURE:  09/29/2017 DATE OF DISCHARGE:                              OPERATIVE REPORT   OPERATION:  Aortic valve replacement using a 21-mm pericardial  Magna Ease valve (serial [id number]).  SURGEON:  Kerin Perna, MD.  ASSISTANT:  Lin Landsman, PA-C.  PREOPERATIVE DIAGNOSES:  Severe aortic stenosis, bicuspid aortic valve, pulmonary hypertension, and left ventricular hypertrophy.  POSTOPERATIVE DIAGNOSES:  Severe aortic stenosis, bicuspid aortic valve, pulmonary hypertension, and left ventricular hypertrophy.  ANESTHESIA:  General by Dr. Mal Amabile.   Discharge Exam: Blood pressure 136/64, pulse (!) 102, temperature 98 F (36.7 C), temperature source Oral, resp. rate 18, height  (1.753 m), weight 186 lb 3.2 oz (84.5 kg), SpO2 100 %.   General appearance: alert, cooperative and no distress  Heart: NSR in the 60s, PPM  Lungs: clear to auscultation bilaterally  Abdomen: soft, non-tender; bowel sounds normal; no masses, no organomegaly  Extremities: extremities normal, atraumatic, no cyanosis or edema  Wound: clean and dry   Disposition: 01-Home or Self Care  Discharge Instructions    Amb Referral to Cardiac Rehabilitation    Complete by:  As directed    Diagnosis:  Valve Replacement   Valve:  Aortic   Discharge patient    Complete by:  As directed    Discharge disposition:  01-Home or Self Care   Discharge patient date:  10/06/2017     Allergies as of 10/06/2017  No Known Allergies     Medication List    STOP taking these medications   amLODipine 10 MG tablet Commonly known as:  NORVASC   aspirin 81 MG tablet Replaced by:  aspirin 325 MG EC tablet   losartan 100 MG tablet Commonly known as:  COZAAR     TAKE these medications   acetaminophen 500 MG tablet Commonly known as:  TYLENOL Take 1,000 mg by mouth daily as needed for moderate pain.   aspirin 325 MG EC tablet Take 1 tablet (325 mg total) by mouth daily. Replaces:  aspirin 81 MG tablet   lovastatin 40 MG tablet Commonly known as:  MEVACOR Take 1 tablet (40 mg total) by mouth at bedtime.   metFORMIN 500 MG tablet Commonly known as:   GLUCOPHAGE TAKE ONE TABLET BY MOUTH TWICE DAILY WITH  MEAL   metoprolol tartrate 50 MG tablet Commonly known as:  LOPRESSOR Take 1 tablet (50 mg total) by mouth 2 (two) times daily. What changed:  medication strength  how much to take   multivitamin with minerals tablet Take 1 tablet by mouth daily.   omeprazole 40 MG capsule Commonly known as:  PRILOSEC Take 1 capsule (40 mg total) by mouth daily.   oxyCODONE 5 MG immediate release tablet Commonly known as:  Oxy IR/ROXICODONE Take 1 tablet (5 mg total) by mouth every 4 (four) hours as needed for severe pain.   oxymetazoline 0.05 % nasal spray Commonly known as:  AFRIN Place 1 spray into both nostrils daily as needed for congestion.   VISINE OP Apply 1 drop to eye daily as needed (dry eyes).            Durable Medical Equipment        Start     Ordered   10/06/17 1117  For home use only DME Walker rolling  Once    Question:  Patient needs a walker to treat with the following condition  Answer:  Weakness   10/06/17 1117     Follow-up Information    [facility] [address] [facility] Follow up on 10/14/2017.   Specialty:  Cardiology Why:  11:00AM, pacemaker wound check Contact information: [address], [address] [place] [address] [phone]       Marinus Maw, MD Follow up.   Specialty:  Cardiology Why:  you will be called to schedule a follow up appointment with Dr. Ladona Ridgel, planned for January Contact information: [address] [place] [address] [phone]        Donata Clay, Theron Arista, MD Follow up on 11/02/2017.   Specialty:  Cardiothoracic Surgery Why:  Appointment is at 11:00 Contact information: [address] [place] [address] [phone]        Yates Decamp, MD Follow up on 10/28/2017.   Specialty:  Cardiology Why:  Appointment is at 2:30 Contact information: [address] [place]  [address] [phone]          The patient has been discharged on:   1.Beta Blocker:  Yes [  x ]                              No   [   ]  If No, reason:  2.Ace Inhibitor/ARB: Yes [   ]                                     No  [ x   ]                                     If No, reason: titration of BB  3.Statin:   Yes [ x  ]                  No  [   ]                  If No, reason:  4.Ecasa:  Yes  [ x  ]                  No   [   ]                  If No, reason:   Signed: Sharlene Dory 10/06/2017, 12:27 PM

## 2017-10-05 NOTE — Progress Notes (Signed)
EPW d/c'd per order and per protocol. Tips intact. VSS. Pt educated on need for q15m vitals and bedrest x1h. Call bell and phone within reach. Will continue to monitor. 

## 2017-10-05 NOTE — Progress Notes (Addendum)
      [address]       [name] [address]             [phone]      2 Days Post-Op Procedure(s) (LRB): PACEMAKER IMPLANT (N/A) Subjective: Feels good this morning. Wants to go home.   Objective: Vital signs in last 24 hours: Temp:  [97.4 F (36.3 C)-98.9 F (37.2 C)] 98.4 F (36.9 C) (10/10 0500) Pulse Rate:  [101-124] 118 (10/10 0500) Cardiac Rhythm: Ventricular paced (10/10 0000) Resp:  [16-30] 16 (10/10 0500) BP: (120-167)/(75-102) 156/97 (10/10 0500) SpO2:  [95 %-100 %] 100 % (10/10 0500) Weight:  [185 lb 9.6 oz (84.2 kg)] 185 lb 9.6 oz (84.2 kg) (10/10 9147)     Intake/Output from previous day: 10/09 0701 - 10/10 0700 In: 720.7 [P.O.:480; I.V.:190.7; IV Piggyback:50] Out: 1325 [Urine:1325] Intake/Output this shift: No intake/output data recorded.  General appearance: alert, cooperative and no distress Heart: sinus tachycardia Lungs: clear to auscultation bilaterally Abdomen: soft, non-tender; bowel sounds normal; no masses,  no organomegaly Extremities: extremities normal, atraumatic, no cyanosis or edema Wound: clean and dry without drainage  Lab Results:  Recent Labs  10/03/17 0500 10/05/17 0249  WBC 11.8* 11.7*  HGB 8.7* 9.8*  HCT 26.5* 30.5*  PLT 103* 177   BMET:  Recent Labs  10/03/17 0500 10/05/17 0249  NA 137 139  K 3.7 4.3  CL 107 106  CO2 24 22  GLUCOSE 107* 114*  BUN 11 18  CREATININE 0.91 1.02  CALCIUM 8.5* 9.2    PT/INR: No results for input(s): LABPROT, INR in the last 72 hours. ABG    Component Value Date/Time   PHART 7.345 (L) 09/29/2017 2050   HCO3 19.6 (L) 09/29/2017 2050   TCO2 23 10/02/2017 1715   ACIDBASEDEF 5.0 (H) 09/29/2017 2050   O2SAT 96.0 09/29/2017 2050   CBG (last 3)   Recent Labs  10/04/17 1149 10/04/17 1555 10/05/17 0707  GLUCAP 127* 135* 130*    Assessment/Plan: S/P Procedure(s) (LRB): PACEMAKER IMPLANT (N/A)  S/P AVR  1. CV-s/p PPM. HR in the 110s. On metoprolol   BID. Marland Kitchen He was on a few agents at home. Removal of EPW today. 2. Pulm-tolerating room air with excellent oxygenation 3. Renal-creatinine 1.02, electrolytes okay. On Lasix  daily. Now he is below his dry weight. Will cut back to  daily and will likely not need lasix at home.  4. H and H trending up 5. Endo-blood glucose well controlled 6. Lovenox for DVT proph   Plan: Remove EPW. Ambulate several times today. Encourage use of incentive spirometer. Likely home in 1-2 days.   Paged for a HR in the 120s with PVCs. Will obtain an EKG. Will increase Lopressor and keep Cozaar at . Okay to remove wires since the patient has a PPM.     LOS: 6 days    Sharlene Dory 10/05/2017 patient examined and medical record reviewed,agree with above note. Should be ready for discharge in a.m. Increase his beta blocker dose because of high sinus node rate Kathlee Nations Trigt III 10/05/2017

## 2017-10-05 NOTE — Progress Notes (Signed)
CARDIAC REHAB PHASE I   PRE:  Rate/Rhythm: 117 paced with PVCs  BP:  Sitting: 108/78        SaO2: 99 RA  MODE:  Ambulation: 370 ft   POST:  Rate/Rhythm: 56 paced with PVCs  BP:  Sitting: 137/92         SaO2: 100 RA  Pt intermediately tachy, pacing, agreeable to walk. Pt required some assistance oob, stood min assist, a little unsteady upon standing. Pt ambulated 370 ft on RA, rolling walker, gait belt, assist x1, slow, steady gait, tolerated fairly well. Pt c/o mild DOE, generalized weakness, standing rest x4. Encouraged IS, additional ambulation x2 today. Pt to recliner after walk, call bell within reach. Will follow.     [id number] Joylene Grapes, RN, BSN 10/05/2017 10:10 AM

## 2017-10-06 LAB — GLUCOSE, CAPILLARY: Glucose-Capillary: 105 mg/dL — ABNORMAL HIGH (ref 65–99)

## 2017-10-06 MED ORDER — METOPROLOL TARTRATE 50 MG PO TABS: 50.0000 mg | ORAL_TABLET | Freq: Two times a day (BID) | ORAL | 1 refills | Status: DC

## 2017-10-06 MED ORDER — ASPIRIN 325 MG PO TBEC: 325.0000 mg | DELAYED_RELEASE_TABLET | Freq: Every day | ORAL | 0 refills | Status: DC

## 2017-10-06 MED ORDER — METOPROLOL TARTRATE 75 MG PO TABS: 75.0000 mg | ORAL_TABLET | Freq: Two times a day (BID) | ORAL | 1 refills | Status: DC

## 2017-10-06 MED ORDER — OXYCODONE HCL 5 MG PO TABS: 5.0000 mg | ORAL_TABLET | ORAL | 0 refills | Status: DC | PRN

## 2017-10-06 NOTE — Care Management Note (Signed)
Case Management Note Original note created by Letha Cape, RN CM  Patient Details  Name: Jonathan Brown MRN: [id number] Date of Birth: 02/11/1951  Subjective/Objective:  From home with wife, who will be with him 24/7 for assistance, POD 1 MVR, extubated, conts on milrinone and dopamine.    10/8 1433 Letha Cape RN, BSN - for PPM placement today.                Action/Plan: NCM will follow for dc needs.   Expected Discharge Date:  10/06/17               Expected Discharge Plan:  Home/Self Care  In-House Referral:     Discharge planning Services  CM Consult  Post Acute Care Choice:  Durable Medical Equipment Choice offered to:  Patient  DME Arranged:  Dan Humphreys rolling DME Agency:  [facility]  HH Arranged:    [facility] Agency:     Status of Service:  Completed, signed off  If discussed at Long Length of Stay Meetings, dates discussed:    Additional Comments: 10/11018 12:10  Deveron Furlong, RN CM Bedside RN notified CM pt needed rolling walker for d/c.  Order has been placed.  Contacted Jermaine with [facility] regarding DME need.  Rolling walker to be delivered to room prior to D/C.  Verdene Lennert, RN 10/06/2017, 12:08 PM

## 2017-10-06 NOTE — Discharge Instructions (Signed)
Aortic Valve Replacement, Care After Refer to this sheet in the next few weeks. These instructions provide you with information about caring for yourself after your procedure. Your health care provider may also give you more specific instructions. Your treatment has been planned according to current medical practices, but problems sometimes occur. Call your health care provider if you have any problems or questions after your procedure. What can I expect after the procedure? After the procedure, it is common to have:  Pain around your incision area.  A small amount of blood or clear fluid coming from your incision.  Follow these instructions at home: Eating and drinking   Follow instructions from your health care provider about eating or drinking restrictions. ? Limit alcohol intake to no more than 1 drink per day for nonpregnant women and 2 drinks per day for men. One drink equals 12 oz of beer, 5 oz of wine, or 1 oz of hard liquor. ? Limit how much caffeine you drink. Caffeine can affect your heart's rate and rhythm.  Drink enough fluid to keep your urine clear or pale yellow.  Eat a heart-healthy diet. This should include plenty of fresh fruits and vegetables. If you eat meat, it should be lean cuts. Avoid foods that are: ? High in salt, saturated fat, or sugar. ? Canned or highly processed. ? Fried. Activity  Return to your normal activities as told by your health care provider. Ask your health care provider what activities are safe for you.  Exercise regularly once you have recovered, as told by your health care provider.  Avoid sitting for more than 2 hours at a time without moving. Get up and move around at least once every 1-2 hours. This helps to prevent blood clots in the legs.  Do not lift anything that is heavier than 10 lb (4.5 kg) until your health care provider approves.  Avoid pushing or pulling things with your arms until your health care provider approves. This  includes pulling on handrails to help you climb stairs. Incision care   Follow instructions from your health care provider about how to take care of your incision. Make sure you: ? Wash your hands with soap and water before you change your bandage (dressing). If soap and water are not available, use hand sanitizer. ? Change your dressing as told by your health care provider. ? Leave stitches (sutures), skin glue, or adhesive strips in place. These skin closures may need to stay in place for 2 weeks or longer. If adhesive strip edges start to loosen and curl up, you may trim the loose edges. Do not remove adhesive strips completely unless your health care provider tells you to do that.  Check your incision area every day for signs of infection. Check for: ? More redness, swelling, or pain. ? More fluid or blood. ? Warmth. ? Pus or a bad smell. Medicines  Take over-the-counter and prescription medicines only as told by your health care provider.  If you were prescribed an antibiotic medicine, take it as told by your health care provider. Do not stop taking the antibiotic even if you start to feel better. Travel  Avoid airplane travel for as long as told by your health care provider.  When you travel, bring a list of your medicines and a record of your medical history with you. Carry your medicines with you. Driving  Ask your health care provider when it is safe for you to drive. Do not drive until your  health care provider approves.  Do not drive or operate heavy machinery while taking prescription pain medicine. Lifestyle   Do not use any tobacco products, such as cigarettes, chewing tobacco, or e-cigarettes. If you need help quitting, ask your health care provider.  Resume sexual activity as told by your health care provider. Do not use medicines for erectile dysfunction unless your health care provider approves, if this applies.  Work with your health care provider to keep your  blood pressure and cholesterol under control, and to manage any other heart conditions that you have.  Maintain a healthy weight. General instructions  Do not take baths, swim, or use a hot tub until your health care provider approves.  Do not strain to have a bowel movement.  Avoid crossing your legs while sitting down.  Check your temperature every day for a fever. A fever may be a sign of infection.  If you are a woman and you plan to become pregnant, talk with your health care provider before you become pregnant.  Wear compression stockings if your health care provider instructs you to do this. These stockings help to prevent blood clots and reduce swelling in your legs.  Tell all health care providers who care for you that you have an artificial (prosthetic) aortic valve. If you have or have had heart disease or endocarditis, tell all health care providers about these conditions as well.  Keep all follow-up visits as told by your health care provider. This is important. Contact a health care provider if:  You develop a skin rash.  You experience sudden, unexplained changes in your weight.  You have more redness, swelling, or pain around your incision.  You have more fluid or blood coming from your incision.  Your incision feels warm to the touch.  You have pus or a bad smell coming from your incision.  You have a fever. Get help right away if:  You develop chest pain that is different from the pain coming from your incision.  You develop shortness of breath or difficulty breathing.  You start to feel light-headed. These symptoms may represent a serious problem that is an emergency. Do not wait to see if the symptoms will go away. Get medical help right away. Call your local emergency services (911 in the [place] Do not drive yourself to the hospital. This information is not intended to replace advice given to you by your health care provider. Make sure you discuss any  questions you have with your health care provider. Document Released: 07/01/2005 Document Revised: 05/20/2016 Document Reviewed: 11/16/2015 Elsevier Interactive Patient Education  2017 [employer]    Supplemental Discharge Instructions for  Pacemaker/Defibrillator Patients  Activity No heavy lifting or vigorous activity with your left/right arm for 6 to 8 weeks.  Do not raise your left/right arm above your head for one week.  Gradually raise your affected arm as drawn below.             10/07/17                   10/08/17                  10/09/17                 10/10/17 __  NO DRIVING please see surgeon's instruction  WOUND CARE - Keep the wound area clean and dry.  Do not get the pace maker site wet for one week. No showers for  a week, longer of directed by surgeon's instructions - The tape/steri-strips on your wound will fall off; do not pull them off.  No bandage is needed on the site.  DO  NOT apply any creams, oils, or ointments to the wound area. - If you notice any drainage or discharge from the wound, any swelling or bruising at the site, or you develop a fever > 101? F after you are discharged home, call the office at once.  Special Instructions - You are still able to use cellular telephones; use the ear opposite the side where you have your pacemaker/defibrillator.  Avoid carrying your cellular phone near your device. - When traveling through airports, show security personnel your identification card to avoid being screened in the metal detectors.  Ask the security personnel to use the hand wand. - Avoid arc welding equipment, MRI testing (magnetic resonance imaging), TENS units (transcutaneous nerve stimulators).  Call the office for questions about other devices. - Avoid electrical appliances that are in poor condition or are not properly grounded. - Microwave ovens are safe to be near or to operate.  Additional information for defibrillator patients should your device  go off: - If your device goes off ONCE and you feel fine afterward, notify the device clinic nurses. - If your device goes off ONCE and you do not feel well afterward, call 911. - If your device goes off TWICE, call 911. - If your device goes off THREE times in one day, call 911.  DO NOT DRIVE YOURSELF OR A FAMILY MEMBER WITH A DEFIBRILLATOR TO THE HOSPITAL--CALL 911.   Aortic Valve Replacement, Care After Refer to this sheet in the next few weeks. These instructions provide you with information about caring for yourself after your procedure. Your health care provider may also give you more specific instructions. Your treatment has been planned according to current medical practices, but problems sometimes occur. Call your health care provider if you have any problems or questions after your procedure. What can I expect after the procedure? After the procedure, it is common to have:  Pain around your incision area.  A small amount of blood or clear fluid coming from your incision.  Follow these instructions at home: Eating and drinking   Follow instructions from your health care provider about eating or drinking restrictions. ? Limit alcohol intake to no more than 1 drink per day for nonpregnant women and 2 drinks per day for men. One drink equals 12 oz of beer, 5 oz of wine, or 1 oz of hard liquor. ? Limit how much caffeine you drink. Caffeine can affect your heart's rate and rhythm.  Drink enough fluid to keep your urine clear or pale yellow.  Eat a heart-healthy diet. This should include plenty of fresh fruits and vegetables. If you eat meat, it should be lean cuts. Avoid foods that are: ? High in salt, saturated fat, or sugar. ? Canned or highly processed. ? Fried. Activity  Return to your normal activities as told by your health care provider. Ask your health care provider what activities are safe for you.  Exercise regularly once you have recovered, as told by your health  care provider.  Avoid sitting for more than 2 hours at a time without moving. Get up and move around at least once every 1-2 hours. This helps to prevent blood clots in the legs.  Do not lift anything that is heavier than 10 lb (4.5 kg) until your health care provider approves.  Avoid pushing or pulling things with your arms until your health care provider approves. This includes pulling on handrails to help you climb stairs. Incision care   Follow instructions from your health care provider about how to take care of your incision. Make sure you: ? Wash your hands with soap and water before you change your bandage (dressing). If soap and water are not available, use hand sanitizer. ? Change your dressing as told by your health care provider. ? Leave stitches (sutures), skin glue, or adhesive strips in place. These skin closures may need to stay in place for 2 weeks or longer. If adhesive strip edges start to loosen and curl up, you may trim the loose edges. Do not remove adhesive strips completely unless your health care provider tells you to do that.  Check your incision area every day for signs of infection. Check for: ? More redness, swelling, or pain. ? More fluid or blood. ? Warmth. ? Pus or a bad smell. Medicines  Take over-the-counter and prescription medicines only as told by your health care provider.  If you were prescribed an antibiotic medicine, take it as told by your health care provider. Do not stop taking the antibiotic even if you start to feel better. Travel  Avoid airplane travel for as long as told by your health care provider.  When you travel, bring a list of your medicines and a record of your medical history with you. Carry your medicines with you. Driving  Ask your health care provider when it is safe for you to drive. Do not drive until your health care provider approves.  Do not drive or operate heavy machinery while taking prescription pain  medicine. Lifestyle   Do not use any tobacco products, such as cigarettes, chewing tobacco, or e-cigarettes. If you need help quitting, ask your health care provider.  Resume sexual activity as told by your health care provider. Do not use medicines for erectile dysfunction unless your health care provider approves, if this applies.  Work with your health care provider to keep your blood pressure and cholesterol under control, and to manage any other heart conditions that you have.  Maintain a healthy weight. General instructions  Do not take baths, swim, or use a hot tub until your health care provider approves.  Do not strain to have a bowel movement.  Avoid crossing your legs while sitting down.  Check your temperature every day for a fever. A fever may be a sign of infection.  If you are a woman and you plan to become pregnant, talk with your health care provider before you become pregnant.  Wear compression stockings if your health care provider instructs you to do this. These stockings help to prevent blood clots and reduce swelling in your legs.  Tell all health care providers who care for you that you have an artificial (prosthetic) aortic valve. If you have or have had heart disease or endocarditis, tell all health care providers about these conditions as well.  Keep all follow-up visits as told by your health care provider. This is important. Contact a health care provider if:  You develop a skin rash.  You experience sudden, unexplained changes in your weight.  You have more redness, swelling, or pain around your incision.  You have more fluid or blood coming from your incision.  Your incision feels warm to the touch.  You have pus or a bad smell coming from your incision.  You have a fever. Get  help right away if:  You develop chest pain that is different from the pain coming from your incision.  You develop shortness of breath or difficulty  breathing.  You start to feel light-headed. These symptoms may represent a serious problem that is an emergency. Do not wait to see if the symptoms will go away. Get medical help right away. Call your local emergency services (911 in the [place] Do not drive yourself to the hospital. This information is not intended to replace advice given to you by your health care provider. Make sure you discuss any questions you have with your health care provider. Document Released: 07/01/2005 Document Revised: 05/20/2016 Document Reviewed: 11/16/2015 Elsevier Interactive Patient Education  2017 [employer].

## 2017-10-06 NOTE — Progress Notes (Addendum)
      [address]       [place] [address]             [phone]      3 Days Post-Op Procedure(s) (LRB): PACEMAKER IMPLANT (N/A) Subjective: Feels okay today. Ready to go home.   Objective: Vital signs in last 24 hours: Temp:  [97.2 F (36.2 C)-98.5 F (36.9 C)] 98 F (36.7 C) (10/11 0425) Pulse Rate:  [74-102] 102 (10/10 2112) Cardiac Rhythm: Ventricular paced (10/10 1900) Resp:  [18] 18 (10/11 0425) BP: (95-138)/(52-85) 136/64 (10/11 0425) SpO2:  [96 %-100 %] 100 % (10/11 0425) Weight:  [186 lb 3.2 oz (84.5 kg)] 186 lb 3.2 oz (84.5 kg) (10/11 0425)     Intake/Output from previous day: 10/10 0701 - 10/11 0700 In: 600 [P.O.:600] Out: 400 [Urine:400] Intake/Output this shift: Total I/O In: -  Out: 100 [Urine:100]  General appearance: alert, cooperative and no distress Heart: NSR in the 60s, PPM Lungs: clear to auscultation bilaterally Abdomen: soft, non-tender; bowel sounds normal; no masses,  no organomegaly Extremities: extremities normal, atraumatic, no cyanosis or edema Wound: clean and dry  Lab Results:  Recent Labs  10/05/17 0249  WBC 11.7*  HGB 9.8*  HCT 30.5*  PLT 177   BMET:  Recent Labs  10/05/17 0249  NA 139  K 4.3  CL 106  CO2 22  GLUCOSE 114*  BUN 18  CREATININE 1.02  CALCIUM 9.2    PT/INR: No results for input(s): LABPROT, INR in the last 72 hours. ABG    Component Value Date/Time   PHART 7.345 (L) 09/29/2017 2050   HCO3 19.6 (L) 09/29/2017 2050   TCO2 23 10/02/2017 1715   ACIDBASEDEF 5.0 (H) 09/29/2017 2050   O2SAT 96.0 09/29/2017 2050   CBG (last 3)   Recent Labs  10/04/17 1555 10/05/17 0707 10/05/17 1634  GLUCAP 135* 130* 102*    Assessment/Plan: S/P Procedure(s) (LRB): PACEMAKER IMPLANT (N/A)  1. CV-HR in the low 100s. PPM. Increased Metoprolol to  BID yesterday. BP well controlled. Cozaar discontinued due to hypotension. EPW out.  2. Pulm-left sided pacemaker is grossly unremarkable  on CXR. No pneumothorax, small bilateral pleural effusions noted. Tolerating room air without issue 3. Renal-creatinine 1.02, electrolytes okay.  4. H and H stable, platelets trending up. 5. Endo-blood glucose level well controlled 6. Lovenox for DVT proph  Plan: Discharge today. Continues to spike his HR into the 120s at time. Will cont Lopressor to  BID.   LOS: 7 days    Sharlene Dory 10/06/2017  patient examined and medical record reviewed,agree with above note. Kathlee Nations Trigt III 10/06/2017

## 2017-10-06 NOTE — Progress Notes (Signed)
CARDIAC REHAB PHASE I   PRE:  Rate/Rhythm: 60 paced  BP:  Sitting: 123/70    MODE:  Ambulation: 350 ft   POST:  Rate/Rhythm: 66 paced  BP:  Sitting: 134/63         SaO2: 98 RA  Pt ambulated 350 ft on RA, rolling walker, assist x1, slow, steady gait, tolerated well. Pt c/o DOE, states much improved since yesterday, standing rest x1. Cardiac surgery discharge education completed with pt and wife at bedside. Reviewed IS, sternal precautions, activity progression, exercise, heart healthy and diabetes diet handouts, daily weights and phase 2 cardiac rehab. Pt and wife verbalized understanding. Pt agrees to phase 2 cardiac rehab referral, will send to [facility]. Pt to recliner after walk, call bell within reach.   [id number] Joylene Grapes, RN, BSN 10/06/2017 11:44 AM

## 2017-10-07 ENCOUNTER — Telehealth (HOSPITAL_COMMUNITY): Payer: Self-pay

## 2017-10-07 NOTE — Telephone Encounter (Signed)
Verified insurance. $45.00 co-pay, no deductible, out of pocket amount is $4,500/$555.78 has been met, no co-insurance, and no pre-authorization is required. Passport/reference # [id number]

## 2017-10-14 ENCOUNTER — Ambulatory Visit (INDEPENDENT_AMBULATORY_CARE_PROVIDER_SITE_OTHER): Payer: Medicare HMO | Admitting: *Deleted

## 2017-10-14 DIAGNOSIS — I442 Atrioventricular block, complete: Secondary | ICD-10-CM

## 2017-10-14 LAB — CUP PACEART INCLINIC DEVICE CHECK
Battery Remaining Longevity: 38 mo
Battery Voltage: 3.17 V
Brady Statistic AP VP Percent: 0.76 %
Brady Statistic AS VS Percent: 0.03 %
Implantable Lead Location: 753860
Implantable Lead Model: 5076
Implantable Pulse Generator Implant Date: 20181008
Lead Channel Impedance Value: 266 Ohm
Lead Channel Impedance Value: 399 Ohm
Lead Channel Pacing Threshold Amplitude: 0.5 V
Lead Channel Pacing Threshold Pulse Width: 0.4 ms
Lead Channel Pacing Threshold Pulse Width: 0.4 ms
Lead Channel Sensing Intrinsic Amplitude: 3.5 mV
Lead Channel Setting Pacing Amplitude: 5 V
Lead Channel Setting Pacing Pulse Width: 1 ms
MDC IDC LEAD IMPLANT DT: 20181008
MDC IDC LEAD IMPLANT DT: 20181008
MDC IDC LEAD LOCATION: 753859
MDC IDC MSMT LEADCHNL RA PACING THRESHOLD AMPLITUDE: 0.75 V
MDC IDC MSMT LEADCHNL RA SENSING INTR AMPL: 1.875 mV
MDC IDC MSMT LEADCHNL RV IMPEDANCE VALUE: 285 Ohm
MDC IDC MSMT LEADCHNL RV IMPEDANCE VALUE: 399 Ohm
MDC IDC SESS DTM: 20181019152755
MDC IDC SET LEADCHNL RA PACING AMPLITUDE: 3.5 V
MDC IDC SET LEADCHNL RV SENSING SENSITIVITY: 2 mV
MDC IDC STAT BRADY AP VS PERCENT: 0 %
MDC IDC STAT BRADY AS VP PERCENT: 99.19 %
MDC IDC STAT BRADY RA PERCENT PACED: 0.77 %
MDC IDC STAT BRADY RV PERCENT PACED: 99.96 %

## 2017-10-14 NOTE — Progress Notes (Signed)
Wound check appointment. Steri-strips removed. Wound without redness or edema. Incision edges approximated, wound well healed. Normal device function. Thresholds, sensing, and impedances consistent with implant measurements. HBP lead tested with use of rhythm strip, septal pacing noted from 5V until LOC. Device programmed at 3.5V(RA), 5V (RV) for extra safety margin until 3 month visit. Histogram distribution appropriate for patient and level of activity. 4.2% AT/AF burden, max dur. + ASA 325; 65yo M, h/o HTN, CHF, DM. No high ventricular rates noted. Patient educated about wound care, arm mobility, lifting restrictions. ROV in 3 months with GT.

## 2017-10-28 DIAGNOSIS — I350 Nonrheumatic aortic (valve) stenosis: Secondary | ICD-10-CM | POA: Diagnosis not present

## 2017-10-28 DIAGNOSIS — I119 Hypertensive heart disease without heart failure: Secondary | ICD-10-CM | POA: Diagnosis not present

## 2017-10-28 DIAGNOSIS — I2510 Atherosclerotic heart disease of native coronary artery without angina pectoris: Secondary | ICD-10-CM | POA: Diagnosis not present

## 2017-10-28 DIAGNOSIS — Z950 Presence of cardiac pacemaker: Secondary | ICD-10-CM | POA: Diagnosis not present

## 2017-10-28 DIAGNOSIS — E782 Mixed hyperlipidemia: Secondary | ICD-10-CM | POA: Diagnosis not present

## 2017-10-31 NOTE — Telephone Encounter (Signed)
Faxed Office notes request form to Dr.Ganji's office. Called office to inform.

## 2017-11-01 ENCOUNTER — Other Ambulatory Visit: Payer: Self-pay | Admitting: Cardiothoracic Surgery

## 2017-11-01 DIAGNOSIS — Z952 Presence of prosthetic heart valve: Secondary | ICD-10-CM

## 2017-11-01 NOTE — Telephone Encounter (Signed)
Called and spoke with patient in regards to Cardiac Rehab - Patient is interested in program. Scheduled orientation on 11/15/2017 at 8:15am. Patient will attend 9:45am exc class.

## 2017-11-02 ENCOUNTER — Other Ambulatory Visit: Payer: Self-pay

## 2017-11-02 ENCOUNTER — Other Ambulatory Visit: Payer: Self-pay | Admitting: *Deleted

## 2017-11-02 ENCOUNTER — Ambulatory Visit
Admission: RE | Admit: 2017-11-02 | Discharge: 2017-11-02 | Disposition: A | Discharge status: Home / Self Care | Payer: Medicare HMO | Source: Ambulatory Visit | Attending: Cardiothoracic Surgery | Admitting: Cardiothoracic Surgery

## 2017-11-02 ENCOUNTER — Encounter: Payer: Self-pay | Admitting: Cardiothoracic Surgery

## 2017-11-02 ENCOUNTER — Ambulatory Visit (INDEPENDENT_AMBULATORY_CARE_PROVIDER_SITE_OTHER): Payer: Self-pay | Admitting: Cardiothoracic Surgery

## 2017-11-02 VITALS — BP 159/87 | HR 72 | Resp 16 | Ht 69.0 in | Wt 187.8 lb

## 2017-11-02 DIAGNOSIS — G8918 Other acute postprocedural pain: Secondary | ICD-10-CM

## 2017-11-02 DIAGNOSIS — Z952 Presence of prosthetic heart valve: Secondary | ICD-10-CM

## 2017-11-02 DIAGNOSIS — R05 Cough: Secondary | ICD-10-CM | POA: Diagnosis not present

## 2017-11-02 DIAGNOSIS — I350 Nonrheumatic aortic (valve) stenosis: Secondary | ICD-10-CM

## 2017-11-02 MED ORDER — OXYCODONE HCL 5 MG PO TABS: 5.0000 mg | ORAL_TABLET | Freq: Four times a day (QID) | ORAL | 0 refills | Status: DC | PRN

## 2017-11-02 NOTE — Progress Notes (Signed)
PCP is Tysinger, Kermit Baloavid S, PA-C Referring Provider is Yates DecampGanji, Jay, MD  Chief Complaint  Patient presents with  . Routine Post Op    s/p AVR 09/29/17 with a CXR    HPI: Patient returns for first postop visit after aortic valve replacement with a 21 mm pericardial valve for severe bicuspid aortic stenosis with severe left ventricular hypertrophy and pulmonary hypertension.  He feels much improved with better exercise tolerance.  The surgical incision is healing well.  He denies shortness of breath or ankle edema.  He is ready to start cardiac rehab, driving and normal daily activities.  He is not ready to lift more than 15-20 pounds until after January 1.   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  He has had the pacemaker checked at the EP office and states is functioning well.  Past Medical History:  Diagnosis Date  . Aortic stenosis    Dr. Jacinto HalimGanji  . Arthritis   . CAD (coronary artery disease)   . Diabetes mellitus without complication (HCC) 2014   type 2   . Former smoker   . GERD (gastroesophageal reflux disease)   . Hyperlipidemia   . Hypertension 2012  . PVD (peripheral vascular disease) (HCC)   . 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 COLONOSCOPY  2008  . CORONARY ARTERY BYPASS GRAFT      Family History  Problem Relation Age of Onset  . Other Mother        died of old age  . Heart disease Father   . Sleep apnea Sister   . Diabetes Brother        diabetic coma x 3 years  . Alcohol abuse Brother   . Heart disease Brother 48       MI  . Cancer Neg Hx   . Stroke Neg Hx     Social History Social History   Tobacco Use  . Smoking status: Former Smoker    Last attempt to quit: 09/28/2007    Years since quitting: 10.1  . Smokeless tobacco: Never Used  Substance Use Topics  . Alcohol use: No  . Drug use: No    Current Outpatient Medications   Medication Sig Dispense Refill  . acetaminophen (TYLENOL) 500 MG tablet Take 1,000 mg by mouth daily as needed for moderate pain.    Marland Kitchen. aspirin 325 MG EC tablet Take 1 tablet (325 mg total) by mouth daily. 30 tablet 0  . lovastatin (MEVACOR) 40 MG tablet Take 1 tablet (40 mg total) by mouth at bedtime. 90 tablet 3  . metFORMIN (GLUCOPHAGE) 500 MG tablet TAKE ONE TABLET BY MOUTH TWICE DAILY WITH  MEAL 180 tablet 3  . metoprolol tartrate (LOPRESSOR) 50 MG tablet Take 1 tablet (50 mg total) by mouth 2 (two) times daily. 60 tablet 1  . Multiple Vitamins-Minerals (MULTIVITAMIN WITH MINERALS) tablet Take 1 tablet by mouth daily.    Marland Kitchen. omeprazole (PRILOSEC) 40 MG capsule Take 1 capsule (40 mg total) by mouth daily. 90 capsule 3  . oxyCODONE (OXY IR/ROXICODONE) 5 MG immediate release tablet Take 1 tablet (5 mg total) every 6 (six) hours as needed by mouth for severe pain. 30 tablet 0  . oxymetazoline (AFRIN) 0.05 % nasal spray Place 1 spray into both nostrils daily as needed for congestion.    . Tetrahydrozoline HCl (VISINE OP)  Apply 1 drop to eye daily as needed (dry eyes).     No current facility-administered medications for this visit.     No Known Allergies  Review of Systems  Improved strength and exercise Presyncope or shortness of breath No fever Weight slowly improved  BP (!) 159/87 (BP Location: Right Arm, Patient Position: Sitting, Cuff Size: Large)   Pulse 72   Resp 16   Ht 5\' 9"  (1.753 m)   Wt 187 lb 12.8 oz (85.2 kg)   SpO2 97% Comment: ON RA  BMI 27.73 kg/m  Physical Exam      Exam    General- alert and comfortable   Lungs- clear without rales, wheezes   Cor- regular rate and rhythm, no murmur , gallop   Abdomen- soft, non-tender   Extremities - warm, non-tender, minimal edema   Neuro- oriented, appropriate, no focal weakness   Diagnostic Tests: Chest x-ray today is clear.  Sternal wires intact.  Pacemaker in place.  No pleural effusion. Impression: Excellent early  recovery after AVR for severe aortic stenosis pulmonary hypertension  Plan: Patient blood pressure elevated today.  He will be started back on his preoperative losartan 100 mg daily.  I also discussed the importance of antibiotic prophylaxis with dental work provided patient a prescription for amoxicillin to be taken 1 hour before dental invasive procedure.  He understands not to have any elective dental work done until after the first of the year.  Patient will return in 6 weeks to check on blood pressure and his general recovery. Mikey BussingPeter Van Trigt III, MD [facility] [phone]

## 2017-11-05 ENCOUNTER — Encounter: Payer: Self-pay | Admitting: Cardiology

## 2017-11-06 ENCOUNTER — Telehealth (HOSPITAL_COMMUNITY): Payer: Self-pay | Admitting: *Deleted

## 2017-11-06 NOTE — Telephone Encounter (Signed)
-----   Message from Marinus MawGregg W Taylor, MD sent at 11/05/2017  5:23 PM EST ----- Regarding: RE: Rip HarbourOk to proceed with cardiac rehab No restrictions to cardiac rehab. GT ----- Message ----- From: Chelsea Ausarlton, Kahne Helfand B, RN Sent: 11/01/2017   2:53 PM To: Marinus MawGregg W Taylor, MD Subject: Ok to proceed with cardiac rehab               Dr. Ladona Ridgelaylor,  The above patient referred to cardiac rehab s/p AVR on 10/4.  Pt also had a PPM placed on 10/8   Pt seen in the device clinic on 10/19 for wound check.  Pt is tentatively scheduled for 11/20 orientation and would begin full exercise on 11/26. May pt participate in group exercise at cardiac rehab?   At this point in her recovery, any restrictions of exercise/activity? Weight limitations?  Thanks for the advisement Alanson Alyarlette Jozef Eisenbeis RN, BSN Cardiac and Pulmonary Rehab Nurse Navigator

## 2017-11-11 ENCOUNTER — Telehealth (HOSPITAL_COMMUNITY): Payer: Self-pay

## 2017-11-11 NOTE — Telephone Encounter (Signed)
Cardiac Rehab Medication Review by a Pharmacist  Does the patient  feel that his/her medications are working for him/her?  no  Has the patient been experiencing any side effects to the medications prescribed?  no  Does the patient measure his/her own blood pressure or blood glucose at home?  yes   Does the patient have any problems obtaining medications due to transportation or finances?   no  Understanding of regimen: good Understanding of indications: good Potential of compliance: good   Pharmacist comments: Patient endorses adherence to all medications and denies any adverse effects. He does not monitor his blood pressure at home, but does check his blood sugar once daily.    Adline PotterSabrina Odilia Damico, PharmD Pharmacy Resident Pager: [phone]

## 2017-11-15 ENCOUNTER — Encounter (HOSPITAL_COMMUNITY)
Admission: RE | Admit: 2017-11-15 | Discharge: 2017-11-15 | Disposition: A | Discharge status: Home / Self Care | Payer: Medicare HMO | Source: Ambulatory Visit | Attending: Cardiology | Admitting: Cardiology

## 2017-11-15 ENCOUNTER — Encounter (HOSPITAL_COMMUNITY): Payer: Self-pay

## 2017-11-15 VITALS — BP 128/80 | HR 74 | Ht 66.5 in | Wt 185.4 lb

## 2017-11-15 DIAGNOSIS — Z952 Presence of prosthetic heart valve: Secondary | ICD-10-CM | POA: Diagnosis not present

## 2017-11-15 DIAGNOSIS — Z48812 Encounter for surgical aftercare following surgery on the circulatory system: Secondary | ICD-10-CM | POA: Insufficient documentation

## 2017-11-15 HISTORY — DX: Atherosclerotic heart disease of native coronary artery without angina pectoris: I25.10

## 2017-11-15 NOTE — Progress Notes (Signed)
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  acetaminophen (TYLENOL) 500 MG tablet, Take 1,000 mg by mouth daily as needed for moderate pain., Disp: , Rfl:  .  aspirin 325 MG EC tablet, Take 1 tablet (325 mg total) by mouth daily., Disp: 30 tablet, Rfl: 0 .  lovastatin (MEVACOR) 40 MG tablet, Take 1 tablet (40 mg total) by mouth at bedtime., Disp: 90 tablet, Rfl: 3 .  metFORMIN (GLUCOPHAGE) 500 MG tablet, TAKE ONE TABLET BY MOUTH TWICE DAILY WITH  MEAL, Disp: 180 tablet, Rfl: 3 .  metoprolol tartrate (LOPRESSOR) 50 MG tablet, Take 1 tablet (50 mg total) by mouth 2 (two) times daily., Disp: 60 tablet, Rfl: 1 .  Multiple Vitamins-Minerals (MULTIVITAMIN WITH MINERALS) tablet, Take 1 tablet by mouth daily., Disp: , Rfl:  .  omeprazole (PRILOSEC) 40 MG capsule, Take 1 capsule (40 mg total) by mouth daily., Disp: 90 capsule, Rfl: 3 .  oxyCODONE (OXY IR/ROXICODONE) 5 MG immediate release tablet, Take 1 tablet (5 mg total) every 6 (six) hours as needed by mouth for severe pain., Disp: 30 tablet, Rfl: 0 .  oxymetazoline (AFRIN) 0.05 % nasal spray, Place 1 spray into both nostrils daily as needed for congestion., Disp: , Rfl:  .  Tetrahydrozoline HCl (VISINE OP), Apply 1 drop to eye daily as needed (dry eyes)., Disp: , Rfl:   Past Medical History: Past Medical History:  Diagnosis Date  . Aortic stenosis    Dr. Jacinto Halim  . Arthritis   . Coronary artery  disease 07/12/2017   Non occlusive CAD from cardiac catherization   . Diabetes mellitus without complication (HCC) 2014   type 2   . Former smoker   . GERD (gastroesophageal reflux disease)   . Hyperlipidemia   . Hypertension 2012  . PVD (peripheral vascular disease) (HCC)   . 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 Last attempt to quit: 09/28/2007  . Years since quitting: 10.1  Smokeless Tobacco Never Used    Labs: Recent Review Flowsheet Data    Labs for ITP Cardiac and Pulmonary Rehab Latest Ref Rng & Units 09/29/2017 09/29/2017 09/29/2017 10/01/2017 10/02/2017   Cholestrol <200 mg/dL - - - - -   LDLCALC <409 mg/dL - - - - -   HDL >81 mg/dL - - - - -   Trlycerides <150 mg/dL - - - - -   Hemoglobin A1c 4.8 - 5.6 % - - - - -   PHART 7.350 - 7.450 7.408 - 7.345(L) - -   PCO2ART 32.0 - 48.0 mmHg 39.3 - 36.2 - -   HCO3 20.0 - 28.0 mmol/L 24.8 - 19.6(L) - -   TCO2 22 - 32 mmol/L 26 23 21(L) 23 23   ACIDBASEDEF 0.0 - 2.0 mmol/L - - 5.0(H) - -  O2SAT % 97.0 - 96.0 - -      Capillary Blood Glucose: Lab Results  Component Value Date   GLUCAP 105 (H) 10/05/2017   GLUCAP 102 (H) 10/05/2017   GLUCAP 130 (H) 10/05/2017   GLUCAP 135 (H) 10/04/2017   GLUCAP 127 (H) 10/04/2017     Exercise Target Goals: Date: 11/15/17  Exercise Program Goal: Individual exercise prescription set with THRR, safety & activity barriers. Participant demonstrates ability to understand and report RPE using BORG scale, to self-measure pulse accurately, and to acknowledge the importance of the exercise prescription.  Exercise Prescription Goal: Starting with aerobic activity 30 plus minutes a day, 3 days per week for initial exercise prescription. Provide home exercise prescription and guidelines that participant acknowledges understanding prior to discharge.  Activity Barriers & Risk Stratification: Activity  Barriers & Cardiac Risk Stratification - 11/15/17 0913      Activity Barriers & Cardiac Risk Stratification   Activity Barriers  Back Problems;Deconditioning;Muscular Weakness    Cardiac Risk Stratification  High       6 Minute Walk: 6 Minute Walk    Row Name 11/15/17 1117         6 Minute Walk   Phase  Initial     Distance  1440 feet     Walk Time  6 minutes     # of Rest Breaks  0     MPH  2.73     METS  3.33     RPE  13     VO2 Peak  11.66     Symptoms  Yes (comment)     Comments  B leg pain     Resting HR  74 bpm     Resting BP  128/80     Resting Oxygen Saturation   98 %     Exercise Oxygen Saturation  during 6 min walk  9 %     Max Ex. HR  97 bpm     Max Ex. BP  140/80     2 Minute Post BP  118/78        Oxygen Initial Assessment:   Oxygen Re-Evaluation:   Oxygen Discharge (Final Oxygen Re-Evaluation):   Initial Exercise Prescription: Initial Exercise Prescription - 11/15/17 1100      Date of Initial Exercise RX and Referring Provider   Date  11/15/17    Referring Provider  Truett Mainland MD      Bike   Level  0.7    Minutes  10    METs  2.55      NuStep   Level  30    SPM  80    Minutes  10    METs  2.5      Track   Laps  10    Minutes  10    METs  2.74      Prescription Details   Frequency (times per week)  3    Duration  Progress to 30 minutes of continuous aerobic without signs/symptoms of physical distress      Intensity   THRR 40-80% of Max Heartrate  62-124    Ratings of Perceived Exertion  11-13    Perceived Dyspnea  0-4      Progression   Progression  Continue to progress workloads to maintain intensity without signs/symptoms of physical distress.      Resistance Training   Training Prescription  Yes    Weight  3lbs    Reps  10-15  Perform Capillary Blood Glucose checks as needed.  Exercise Prescription Changes:   Exercise Comments:   Exercise Goals and Review:  Exercise Goals    Row Name 11/15/17  0917             Exercise Goals   Increase Physical Activity  Yes       Intervention  Provide advice, education, support and counseling about physical activity/exercise needs.;Develop an individualized exercise prescription for aerobic and resistive training based on initial evaluation findings, risk stratification, comorbidities and participant's personal goals.       Expected Outcomes  Achievement of increased cardiorespiratory fitness and enhanced flexibility, muscular endurance and strength shown through measurements of functional capacity and personal statement of participant.       Increase Strength and Stamina  Yes increase UE strength to return to yardwork and manual labor.  Learn how to walk on treadmill       Intervention  Provide advice, education, support and counseling about physical activity/exercise needs.;Develop an individualized exercise prescription for aerobic and resistive training based on initial evaluation findings, risk stratification, comorbidities and participant's personal goals.       Expected Outcomes  Achievement of increased cardiorespiratory fitness and enhanced flexibility, muscular endurance and strength shown through measurements of functional capacity and personal statement of participant.       Able to understand and use rate of perceived exertion (RPE) scale  Yes       Intervention  Provide education and explanation on how to use RPE scale       Expected Outcomes  Short Term: Able to use RPE daily in rehab to express subjective intensity level;Long Term:  Able to use RPE to guide intensity level when exercising independently       Knowledge and understanding of Target Heart Rate Range (THRR)  Yes       Intervention  Provide education and explanation of THRR including how the numbers were predicted and where they are located for reference       Expected Outcomes  Short Term: Able to state/look up THRR;Long Term: Able to use THRR to govern intensity when  exercising independently;Short Term: Able to use daily as guideline for intensity in rehab       Able to check pulse independently  Yes       Intervention  Provide education and demonstration on how to check pulse in carotid and radial arteries.;Review the importance of being able to check your own pulse for safety during independent exercise       Expected Outcomes  Short Term: Able to explain why pulse checking is important during independent exercise;Long Term: Able to check pulse independently and accurately       Understanding of Exercise Prescription  Yes       Intervention  Provide education, explanation, and written materials on patient's individual exercise prescription       Expected Outcomes  Short Term: Able to explain program exercise prescription;Long Term: Able to explain home exercise prescription to exercise independently          Exercise Goals Re-Evaluation :    Discharge Exercise Prescription (Final Exercise Prescription Changes):   Nutrition:  Target Goals: Understanding of nutrition guidelines, daily intake of sodium 1500mg , cholesterol 200mg , calories 30% from fat and 7% or less from saturated fats, daily to have 5 or more servings of fruits and vegetables.  Biometrics: Pre Biometrics - 11/15/17 1201      Pre Biometrics   Height  5' 6.5" (  1.689 m)    Weight  185 lb 6.5 oz (84.1 kg)    Waist Circumference  41 inches    Hip Circumference  40 inches    Waist to Hip Ratio  1.02 %    BMI (Calculated)  29.48    Triceps Skinfold  15 mm    % Body Fat  28.8 %    Grip Strength  36 kg    Flexibility  7.5 in    Single Leg Stand  30 seconds        Nutrition Therapy Plan and Nutrition Goals:   Nutrition Discharge: Nutrition Scores:   Nutrition Goals Re-Evaluation:   Nutrition Goals Re-Evaluation:   Nutrition Goals Discharge (Final Nutrition Goals Re-Evaluation):   Psychosocial: Target Goals: Acknowledge presence or absence of significant depression  and/or stress, maximize coping skills, provide positive support system. Participant is able to verbalize types and ability to use techniques and skills needed for reducing stress and depression.  Initial Review & Psychosocial Screening: Initial Psych Review & Screening - 11/15/17 1159      Initial Review   Current issues with  None Identified      Family Dynamics   Good Support System?  Yes    Comments  no psychosocial needs identified, no interventions necessary       Barriers   Psychosocial barriers to participate in program  There are no identifiable barriers or psychosocial needs.      Screening Interventions   Interventions  Encouraged to exercise       Quality of Life Scores: Quality of Life - 11/15/17 1033      Quality of Life Scores   Health/Function Pre  26.6 %    Socioeconomic Pre  24.6 %    Psych/Spiritual Pre  27.7 %    Family Pre  28.8 %    GLOBAL Pre  26.69 %       PHQ-9: Recent Review Flowsheet Data    Depression screen [facility] 2/9 06/14/2017   Decreased Interest 0   Down, Depressed, Hopeless 0   PHQ - 2 Score 0     Interpretation of Total Score  Total Score Depression Severity:  1-4 = Minimal depression, 5-9 = Mild depression, 10-14 = Moderate depression, 15-19 = Moderately severe depression, 20-27 = Severe depression   Psychosocial Evaluation and Intervention:   Psychosocial Re-Evaluation:   Psychosocial Discharge (Final Psychosocial Re-Evaluation):   Vocational Rehabilitation: Provide vocational rehab assistance to qualifying candidates.   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 Participant will state understanding/return demonstration of  topics presented.  Learning Barriers/Preferences: Learning Barriers/Preferences - 11/15/17 1057      Learning Barriers/Preferences   Learning Barriers  Reading;Sight       Education Topics: Count Your Pulse:  -Group instruction provided by verbal instruction, demonstration, patient participation and written materials to support subject.  Instructors address importance of being able to find your pulse and how to count your pulse when at home without a heart monitor.  Patients get hands on experience counting their pulse with staff help and individually.   Heart Attack, Angina, and Risk Factor Modification:  -Group instruction provided by verbal instruction, video, and written materials to support subject.  Instructors address signs and symptoms of angina and heart attacks.    Also discuss risk factors for heart disease and how to make changes to improve heart health risk factors.   Functional Fitness:  -Group instruction provided by verbal instruction, demonstration, patient participation, and written materials to support subject.  Instructors address safety measures for doing things around the house.  Discuss how to get up and down off the floor, how to pick things up properly, how to safely get out of a chair without assistance, and balance training.   Meditation and Mindfulness:  -Group instruction provided by verbal instruction, patient participation, and written materials to support subject.  Instructor addresses importance of mindfulness and meditation practice to help reduce stress and improve awareness.  Instructor also leads participants through a meditation exercise.    Stretching for Flexibility and Mobility:  -Group instruction provided by verbal instruction, patient participation, and written materials to support subject.  Instructors lead participants through series of stretches that are designed to increase flexibility thus improving mobility.  These stretches are additional  exercise for major muscle groups that are typically performed during regular warm up and cool down.   Hands Only CPR:  -Group verbal, video, and participation provides a basic overview of AHA guidelines for community CPR. Role-play of emergencies allow participants the opportunity to practice calling for help and chest compression technique with discussion of AED use.   Hypertension: -Group verbal and written instruction that provides a basic overview of hypertension including the most recent diagnostic guidelines, risk factor reduction with self-care instructions and medication management.    Nutrition I class: Heart Healthy Eating:  -Group instruction provided by PowerPoint slides, verbal discussion, and written materials to support subject matter. The instructor gives an explanation and review of the Therapeutic Lifestyle Changes diet recommendations, which includes a discussion on lipid goals, dietary fat, sodium, fiber, plant stanol/sterol esters, sugar, and the components of a well-balanced, healthy diet.   Nutrition II class: Lifestyle Skills:  -Group instruction provided by PowerPoint slides, verbal discussion, and written materials to support subject matter. The instructor gives an explanation and review of label reading, grocery shopping for heart health, heart healthy recipe modifications, and ways to make healthier choices when eating out.   Diabetes Question & Answer:  -Group instruction provided by PowerPoint slides, verbal discussion, and written materials to support subject matter. The instructor gives an explanation and review of diabetes co-morbidities, pre- and post-prandial blood glucose goals, pre-exercise blood glucose goals, signs, symptoms, and treatment of hypoglycemia and hyperglycemia, and foot care basics.   Diabetes Blitz:  -Group instruction provided by PowerPoint slides, verbal discussion, and written materials to support subject matter. The instructor gives an  explanation and review of the physiology behind type 1 and type 2 diabetes, diabetes medications and rational behind using different medications, pre- and post-prandial blood glucose recommendations and Hemoglobin A1c goals, diabetes diet, and exercise including blood glucose guidelines for exercising safely.    Portion Distortion:  -Group instruction provided by PowerPoint slides, verbal discussion, written materials, and food models to support subject matter. The instructor gives an explanation of serving size versus portion size, changes in portions sizes over the last 20 years, and what consists of a serving from each food group.   Stress Management:  -Group instruction provided by verbal instruction, video, and written materials to support subject matter.  Instructors review role of stress in heart disease and how to cope with stress positively.     Exercising on Your  Own:  -Group instruction provided by verbal instruction, power point, and written materials to support subject.  Instructors discuss benefits of exercise, components of exercise, frequency and intensity of exercise, and end points for exercise.  Also discuss use of nitroglycerin and activating EMS.  Review options of places to exercise outside of rehab.  Review guidelines for sex with heart disease.   Cardiac Drugs I:  -Group instruction provided by verbal instruction and written materials to support subject.  Instructor reviews cardiac drug classes: antiplatelets, anticoagulants, beta blockers, and statins.  Instructor discusses reasons, side effects, and lifestyle considerations for each drug class.   Cardiac Drugs II:  -Group instruction provided by verbal instruction and written materials to support subject.  Instructor reviews cardiac drug classes: angiotensin converting enzyme inhibitors (ACE-I), angiotensin II receptor blockers (ARBs), nitrates, and calcium channel blockers.  Instructor discusses reasons, side effects,  and lifestyle considerations for each drug class.   Anatomy and Physiology of the Circulatory System:  Group verbal and written instruction and models provide basic cardiac anatomy and physiology, with the coronary electrical and arterial systems. Review of: AMI, Angina, Valve disease, Heart Failure, Peripheral Artery Disease, Cardiac Arrhythmia, Pacemakers, and the ICD.   Other Education:  -Group or individual verbal, written, or video instructions that support the educational goals of the cardiac rehab program.   Knowledge Questionnaire Score: Knowledge Questionnaire Score - 11/15/17 0916      Knowledge Questionnaire Score   Pre Score  15/24       Core Components/Risk Factors/Patient Goals at Admission: Personal Goals and Risk Factors at Admission - 11/15/17 1122      Core Components/Risk Factors/Patient Goals on Admission    Weight Management  Yes;Weight Maintenance;Weight Loss    Intervention  Weight Management: Develop a combined nutrition and exercise program designed to reach desired caloric intake, while maintaining appropriate intake of nutrient and fiber, sodium and fats, and appropriate energy expenditure required for the weight goal.;Weight Management/Obesity: Establish reasonable short term and long term weight goals.;Weight Management: Provide education and appropriate resources to help participant work on and attain dietary goals.    Admit Weight  185 lb 6.5 oz (84.1 kg)    Goal Weight: Short Term  180 lb (81.6 kg)    Goal Weight: Long Term  175 lb (79.4 kg)    Expected Outcomes  Short Term: Continue to assess and modify interventions until short term weight is achieved;Long Term: Adherence to nutrition and physical activity/exercise program aimed toward attainment of established weight goal;Weight Maintenance: Understanding of the daily nutrition guidelines, which includes 25-35% calories from fat, 7% or less cal from saturated fats, less than 200mg  cholesterol, less than  1.5gm of sodium, & 5 or more servings of fruits and vegetables daily;Weight Loss: Understanding of general recommendations for a balanced deficit meal plan, which promotes 1-2 lb weight loss per week and includes a negative energy balance of [phone] kcal/d;Understanding recommendations for meals to include 15-35% energy as protein, 25-35% energy from fat, 35-60% energy from carbohydrates, less than 200mg  of dietary cholesterol, 20-35 gm of total fiber daily;Understanding of distribution of calorie intake throughout the day with the consumption of 4-5 meals/snacks    Diabetes  Yes    Intervention  Provide education about signs/symptoms and action to take for hypo/hyperglycemia.;Provide education about proper nutrition, including hydration, and aerobic/resistive exercise prescription along with prescribed medications to achieve blood glucose in normal ranges: Fasting glucose 65-99 mg/dL    Expected Outcomes  Short Term: Participant verbalizes understanding of  the signs/symptoms and immediate care of hyper/hypoglycemia, proper foot care and importance of medication, aerobic/resistive exercise and nutrition plan for blood glucose control.;Long Term: Attainment of HbA1C < 7%.    Hypertension  Yes    Intervention  Provide education on lifestyle modifcations including regular physical activity/exercise, weight management, moderate sodium restriction and increased consumption of fresh fruit, vegetables, and low fat dairy, alcohol moderation, and smoking cessation.;Monitor prescription use compliance.    Expected Outcomes  Short Term: Continued assessment and intervention until BP is < 140/81mm HG in hypertensive participants. < 130/79mm HG in hypertensive participants with diabetes, heart failure or chronic kidney disease.;Long Term: Maintenance of blood pressure at goal levels.    Lipids  Yes    Intervention  Provide education and support for participant on nutrition & aerobic/resistive exercise along with  prescribed medications to achieve LDL 70mg , HDL >40mg .    Expected Outcomes  Short Term: Participant states understanding of desired cholesterol values and is compliant with medications prescribed. Participant is following exercise prescription and nutrition guidelines.;Long Term: Cholesterol controlled with medications as prescribed, with individualized exercise RX and with personalized nutrition plan. Value goals: LDL < 70mg , HDL > 40 mg.       Core Components/Risk Factors/Patient Goals Review:    Core Components/Risk Factors/Patient Goals at Discharge (Final Review):    ITP Comments: ITP Comments    Row Name 11/15/17 0902           ITP Comments  Dr. Armanda Magic, Medical Director           Comments: Gerlene Burdock attended orientation from [phone] to 1001 to review rules and guidelines for program. Completed 6 minute walk test, Intitial ITP, and exercise prescription.  VSS. Telemetry-V paced with underlying Sinus Rhythm. Patient reported having some bilateral leg pain during his walk test. Patient has a history of claudication.Gladstone Lighter, RN,BSN 11/15/2017 12:21 PM

## 2017-11-15 NOTE — Progress Notes (Signed)
Jonathan FinnerRichard G Keithly [age] y.o. male DOB: September 01, 1951 MRN: [id number]      Nutrition Note  1. 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 Aortic stenosis    Dr. Jacinto HalimGanji  . Arthritis   . Coronary artery disease 07/12/2017   Non occlusive CAD from cardiac catherization   . Diabetes mellitus without complication (HCC) 2014   type 2   . Former smoker   . GERD (gastroesophageal reflux disease)   . Hyperlipidemia   . Hypertension 2012  . PVD (peripheral vascular disease) (HCC)   . 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 Metformin noted  HT: Ht Readings from Last 1 Encounters:  11/15/17 5' 6.5" (1.689 m)    WT: Wt Readings from Last 3 Encounters:  11/15/17 185 lb 6.5 oz (84.1 kg)  11/02/17 187 lb 12.8 oz (85.2 kg)  10/06/17 186 lb 3.2 oz (84.5 kg)     BMI 29.5   Current tobacco use? No  Labs:  Lipid Panel     Component Value Date/Time   CHOL 147 06/14/2017 0835   TRIG 82 06/14/2017 0835   HDL 34 (L) 06/14/2017 0835   CHOLHDL 4.3 06/14/2017 0835   VLDL 16 06/14/2017 0835   LDLCALC 97 06/14/2017 0835   Lab Results  Component Value Date   HGBA1C 5.1 09/27/2017   CBG (last 3)  No results for input(s): GLUCAP in the last 72 hours.  Nutrition Note Spoke with pt and pt's wife. Nutrition plan and goals reviewed with pt. Pt is working toward following a [employer] diet. Pt wants to lose wt. Pt has not been actively trying to lose wt and hopes to lose wt by being more active. Wt loss tips reviewed. Pt is diabetic. Pt checks his CBG's once daily. Last A1c indicates blood glucose well controlled. Pt expressed understanding of the information reviewed. Pt aware of nutrition education classes offered.  Nutrition Diagnosis ? Food-and nutrition-related knowledge deficit related to lack of exposure to information as related to diagnosis of: ? CVD ? Overweight related to excessive energy intake as evidenced by a BMI of 29.5  Nutrition  Intervention ? Pt's individual nutrition plan and goals reviewed with pt. ? Pt given handouts for: ? Nutrition I class ? Nutrition II class   Nutrition Goal(s):  ? Pt to identify and limit food sources of saturated fat, trans fat, and sodium ? Pt to identify food quantities necessary to achieve weight loss of 6-10 lb  at graduation from cardiac rehab.  Plan:  Pt to attend nutrition classes ? Nutrition I ? Nutrition II ? Portion Distortion ? Diabetes Blitz ? Diabetes Q & A Will provide client-centered nutrition education as part of interdisciplinary care.   Monitor and evaluate progress toward nutrition goal with team.  Mickle PlumbEdna Rashawna Scoles, M.Ed, RD, LDN, CDE 11/15/2017 3:24 PM

## 2017-11-21 ENCOUNTER — Encounter (HOSPITAL_COMMUNITY)
Admission: RE | Admit: 2017-11-21 | Discharge: 2017-11-21 | Disposition: A | Discharge status: Home / Self Care | Payer: Medicare HMO | Source: Ambulatory Visit | Attending: Cardiology | Admitting: Cardiology

## 2017-11-21 DIAGNOSIS — Z952 Presence of prosthetic heart valve: Secondary | ICD-10-CM | POA: Diagnosis not present

## 2017-11-21 DIAGNOSIS — Z48812 Encounter for surgical aftercare following surgery on the circulatory system: Secondary | ICD-10-CM | POA: Diagnosis not present

## 2017-11-21 LAB — GLUCOSE, CAPILLARY
Glucose-Capillary: 159 mg/dL — ABNORMAL HIGH (ref 65–99)
Glucose-Capillary: 126 mg/dL — ABNORMAL HIGH (ref 65–99)

## 2017-11-21 NOTE — Progress Notes (Signed)
Daily Session Note  Patient Details  Name: Jonathan Brown MRN: [id number] Date of Birth: 26-Mar-1951 Referring Provider:     [place] from 11/15/2017 in [place]  Referring Provider  Jonathan Leep MD      Encounter Date: 11/21/2017  Check In: Session Check In - 11/21/17 1030      Check-In   Location  [facility]    Staff Present  Jonathan diVincenzo, MS, ACSM RCEP, Exercise Physiologist;Jonathan Equality, MS, ACSM CEP, Exercise Physiologist;Jonathan Wann, RN, BSN;Jonathan Fair, MS, ACSM RCEP, Exercise Physiologist    Supervising physician immediately available to respond to emergencies  [facility] immediately available    Physician(s)  Jonathan Brown     Medication changes reported      No    Fall or balance concerns reported     No    Tobacco Cessation  No Change    Warm-up and Cool-down  Performed as group-led instruction    Resistance Training Performed  Yes    VAD Patient?  No      Pain Assessment   Currently in Pain?  No/denies    Multiple Pain Sites  No       Capillary Blood Glucose: Results for orders placed or performed during the hospital encounter of 11/21/17 (from the past 24 hour(s))  Glucose, capillary     Status: Abnormal   Collection Time: 11/21/17  9:56 AM  Result Value Ref Range   Glucose-Capillary 159 (H) 65 - 99 mg/dL  Glucose, capillary     Status: Abnormal   Collection Time: 11/21/17 10:49 AM  Result Value Ref Range   Glucose-Capillary 126 (H) 65 - 99 mg/dL      Social History   Tobacco Use  Smoking Status Former Smoker  . Last attempt to quit: 09/28/2007  . Years since quitting: 10.1  Smokeless Tobacco Never Used    Goals Met:  Exercise tolerated well  Goals Unmet:  Not Applicable  Comments: Jonathan Brown started cardiac rehab today.  Pt tolerated light exercise without difficulty. VSS, telemetry-Vpaced, asymptomatic.  Medication list reconciled. Pt denies  barriers to medicaiton compliance.  PSYCHOSOCIAL ASSESSMENT:  PHQ-0. Pt exhibits positive coping skills, hopeful outlook with supportive family. No psychosocial needs identified at this time, no psychosocial interventions necessary.    Pt enjoys outdoor activites.   Pt oriented to exercise equipment and routine.    Understanding verbalized.Jonathan Pall, RN,BSN 11/21/2017 5:17 PM   Jonathan Brown is Medical Director for Cardiac Rehab at [facility].

## 2017-11-23 ENCOUNTER — Encounter (HOSPITAL_COMMUNITY)
Admission: RE | Admit: 2017-11-23 | Discharge: 2017-11-23 | Disposition: A | Discharge status: Home / Self Care | Payer: Medicare HMO | Source: Ambulatory Visit | Attending: Cardiology | Admitting: Cardiology

## 2017-11-23 DIAGNOSIS — Z952 Presence of prosthetic heart valve: Secondary | ICD-10-CM | POA: Diagnosis not present

## 2017-11-23 DIAGNOSIS — Z48812 Encounter for surgical aftercare following surgery on the circulatory system: Secondary | ICD-10-CM | POA: Diagnosis not present

## 2017-11-23 LAB — GLUCOSE, CAPILLARY
GLUCOSE-CAPILLARY: 162 mg/dL — AB (ref 65–99)
GLUCOSE-CAPILLARY: 116 mg/dL — AB (ref 65–99)

## 2017-11-25 ENCOUNTER — Encounter (HOSPITAL_COMMUNITY)
Admission: RE | Admit: 2017-11-25 | Discharge: 2017-11-25 | Disposition: A | Discharge status: Home / Self Care | Payer: Medicare HMO | Source: Ambulatory Visit | Attending: Cardiology | Admitting: Cardiology

## 2017-11-25 DIAGNOSIS — Z48812 Encounter for surgical aftercare following surgery on the circulatory system: Secondary | ICD-10-CM | POA: Diagnosis not present

## 2017-11-25 DIAGNOSIS — Z952 Presence of prosthetic heart valve: Secondary | ICD-10-CM | POA: Diagnosis not present

## 2017-11-25 LAB — GLUCOSE, CAPILLARY
GLUCOSE-CAPILLARY: 159 mg/dL — AB (ref 65–99)
Glucose-Capillary: 116 mg/dL — ABNORMAL HIGH (ref 65–99)

## 2017-11-25 NOTE — Progress Notes (Signed)
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  acetaminophen (TYLENOL) 500 MG tablet, Take 1,000 mg by mouth daily as needed for moderate pain., Disp: , Rfl:  .  aspirin 325 MG EC tablet, Take 1 tablet (325 mg total) by mouth daily., Disp: 30 tablet, Rfl: 0 .  losartan (COZAAR) 100 MG tablet, Take 100 mg by mouth daily., Disp: , Rfl:  .  lovastatin (MEVACOR) 40 MG tablet, Take 1 tablet (40 mg total) by mouth at bedtime., Disp: 90 tablet, Rfl: 3 .  metFORMIN (GLUCOPHAGE) 500 MG tablet, TAKE ONE TABLET BY MOUTH TWICE DAILY WITH  MEAL, Disp: 180 tablet, Rfl: 3 .  metoprolol tartrate (LOPRESSOR) 50 MG tablet, Take 1 tablet (50 mg total) by mouth 2 (two) times daily., Disp: 60 tablet, Rfl: 1 .  Multiple Vitamins-Minerals (MULTIVITAMIN WITH MINERALS) tablet, Take 1 tablet by mouth daily., Disp: , Rfl:  .  omeprazole (PRILOSEC) 40 MG capsule, Take 1 capsule (40 mg total) by mouth daily., Disp: 90 capsule, Rfl: 3 .  oxyCODONE (OXY IR/ROXICODONE) 5 MG immediate release tablet, Take 1 tablet (5 mg total) every 6 (six) hours as needed by mouth for severe pain. (Patient taking differently: Take 5 mg by mouth every 6 (six) hours as needed for severe pain (taking as needed). ), Disp: 30 tablet, Rfl: 0 .  oxymetazoline (AFRIN) 0.05 % nasal spray, Place 1 spray into both nostrils daily as needed for congestion., Disp: , Rfl:  .  Tetrahydrozoline HCl (VISINE OP), Apply  1 drop to eye daily as needed (dry eyes)., Disp: , Rfl:   Past Medical History: Past Medical History:  Diagnosis Date  . Aortic stenosis    Dr. Jacinto Halim  . Arthritis   . Coronary artery disease 07/12/2017   Non occlusive CAD from cardiac catherization   . Diabetes mellitus without complication (HCC) 2014   type 2   . Former smoker   . GERD (gastroesophageal reflux disease)   . Hyperlipidemia   . Hypertension 2012  . PVD (peripheral vascular disease) (HCC)   . 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 Last attempt to quit: 09/28/2007  . Years since quitting: 10.1  Smokeless Tobacco Never Used    Labs: Recent Review Flowsheet Data    Labs for ITP Cardiac and Pulmonary Rehab Latest Ref Rng & Units 09/29/2017 09/29/2017 09/29/2017 10/01/2017 10/02/2017   Cholestrol <200 mg/dL - - - - -   LDLCALC <409 mg/dL - - - - -   HDL >81 mg/dL - - - - -   Trlycerides <150 mg/dL - - - - -   Hemoglobin A1c 4.8 - 5.6 % - - - - -   PHART 7.350 - 7.450 7.408 - 7.345(L) - -   PCO2ART 32.0 - 48.0 mmHg 39.3 - 36.2 - -  HCO3 20.0 - 28.0 mmol/L 24.8 - 19.6(L) - -   TCO2 22 - 32 mmol/L 26 23 21(L) 23 23   ACIDBASEDEF 0.0 - 2.0 mmol/L - - 5.0(H) - -   O2SAT % 97.0 - 96.0 - -      Capillary Blood Glucose: Lab Results  Component Value Date   GLUCAP 116 (H) 11/25/2017   GLUCAP 159 (H) 11/25/2017   GLUCAP 116 (H) 11/23/2017   GLUCAP 162 (H) 11/23/2017   GLUCAP 126 (H) 11/21/2017     Exercise Target Goals:    Exercise Program Goal: Individual exercise prescription set with THRR, safety & activity barriers. Participant demonstrates ability to understand and report RPE using BORG scale, to self-measure pulse accurately, and to acknowledge the importance of the exercise prescription.  Exercise Prescription Goal: Starting with aerobic activity 30 plus minutes a day, 3 days per week for initial exercise  prescription. Provide home exercise prescription and guidelines that participant acknowledges understanding prior to discharge.  Activity Barriers & Risk Stratification: Activity Barriers & Cardiac Risk Stratification - 11/15/17 0913      Activity Barriers & Cardiac Risk Stratification   Activity Barriers  Back Problems;Deconditioning;Muscular Weakness    Cardiac Risk Stratification  High       6 Minute Walk: 6 Minute Walk    Row Name 11/15/17 1117         6 Minute Walk   Phase  Initial     Distance  1440 feet     Walk Time  6 minutes     # of Rest Breaks  0     MPH  2.73     METS  3.33     RPE  13     VO2 Peak  11.66     Symptoms  Yes (comment)     Comments  B leg pain     Resting HR  74 bpm     Resting BP  128/80     Resting Oxygen Saturation   98 %     Exercise Oxygen Saturation  during 6 min walk  9 %     Max Ex. HR  97 bpm     Max Ex. BP  140/80     2 Minute Post BP  118/78        Oxygen Initial Assessment:   Oxygen Re-Evaluation:   Oxygen Discharge (Final Oxygen Re-Evaluation):   Initial Exercise Prescription: Initial Exercise Prescription - 11/15/17 1100      Date of Initial Exercise RX and Referring Provider   Date  11/15/17    Referring Provider  Truett Mainland MD      Bike   Level  0.7    Minutes  10    METs  2.55      NuStep   Level  30    SPM  80    Minutes  10    METs  2.5      Track   Laps  10    Minutes  10    METs  2.74      Prescription Details   Frequency (times per week)  3    Duration  Progress to 30 minutes of continuous aerobic without signs/symptoms of physical distress      Intensity   THRR 40-80% of Max Heartrate  62-124    Ratings of Perceived Exertion  11-13    Perceived Dyspnea  0-4      Progression   Progression  Continue to  progress workloads to maintain intensity without signs/symptoms of physical distress.      Resistance Training   Training Prescription  Yes    Weight  3lbs    Reps  10-15        Perform Capillary Blood Glucose checks as needed.  Exercise Prescription Changes: Exercise Prescription Changes    Row Name 11/23/17 [phone]             Response to Exercise   Blood Pressure (Admit)  124/80       Blood Pressure (Exercise)  128/80       Blood Pressure (Exit)  122/64       Heart Rate (Admit)  77 bpm       Heart Rate (Exercise)  90 bpm       Heart Rate (Exit)  68 bpm       Rating of Perceived Exertion (Exercise)  13       Symptoms  none       Duration  Progress to 30 minutes of  aerobic without signs/symptoms of physical distress       Intensity  THRR unchanged         Progression   Progression  Continue to progress workloads to maintain intensity without signs/symptoms of physical distress.       Average METs  2.4         Resistance Training   Training Prescription  No Relaxation today         Interval Training   Interval Training  No         Bike   Level  0.7       Minutes  10       METs  2.56         NuStep   Level  3       SPM  80       Minutes  10       METs  1.8         Track   Laps  11       Minutes  10       METs  2.92          Exercise Comments: Exercise Comments    Row Name 11/23/17 1208           Exercise Comments  Reviewed METs with patient.          Exercise Goals and Review: Exercise Goals    Row Name 11/15/17 0917             Exercise Goals   Increase Physical Activity  Yes       Intervention  Provide advice, education, support and counseling about physical activity/exercise needs.;Develop an individualized exercise prescription for aerobic and resistive training based on initial evaluation findings, risk stratification, comorbidities and participant's personal goals.       Expected Outcomes  Achievement of increased cardiorespiratory fitness and enhanced flexibility, muscular endurance and strength shown through measurements of functional capacity and personal statement of participant.       Increase Strength  and Stamina  Yes increase UE strength to return to yardwork and manual labor.  Learn how to walk on treadmill       Intervention  Provide advice, education, support and counseling about physical activity/exercise needs.;Develop an individualized exercise prescription for aerobic and resistive training based on initial evaluation findings, risk stratification, comorbidities and participant's personal goals.       Expected Outcomes  Achievement of  increased cardiorespiratory fitness and enhanced flexibility, muscular endurance and strength shown through measurements of functional capacity and personal statement of participant.       Able to understand and use rate of perceived exertion (RPE) scale  Yes       Intervention  Provide education and explanation on how to use RPE scale       Expected Outcomes  Short Term: Able to use RPE daily in rehab to express subjective intensity level;Long Term:  Able to use RPE to guide intensity level when exercising independently       Knowledge and understanding of Target Heart Rate Range (THRR)  Yes       Intervention  Provide education and explanation of THRR including how the numbers were predicted and where they are located for reference       Expected Outcomes  Short Term: Able to state/look up THRR;Long Term: Able to use THRR to govern intensity when exercising independently;Short Term: Able to use daily as guideline for intensity in rehab       Able to check pulse independently  Yes       Intervention  Provide education and demonstration on how to check pulse in carotid and radial arteries.;Review the importance of being able to check your own pulse for safety during independent exercise       Expected Outcomes  Short Term: Able to explain why pulse checking is important during independent exercise;Long Term: Able to check pulse independently and accurately       Understanding of Exercise Prescription  Yes       Intervention  Provide education, explanation, and  written materials on patient's individual exercise prescription       Expected Outcomes  Short Term: Able to explain program exercise prescription;Long Term: Able to explain home exercise prescription to exercise independently          Exercise Goals Re-Evaluation : Exercise Goals Re-Evaluation    Row Name 11/23/17 1208             Exercise Goal Re-Evaluation   Exercise Goals Review  Increase Physical Activity       Comments  Off to a good start with exercise.       Expected Outcomes  Increase workloads as tolerated.           Discharge Exercise Prescription (Final Exercise Prescription Changes): Exercise Prescription Changes - 11/23/17 0948      Response to Exercise   Blood Pressure (Admit)  124/80    Blood Pressure (Exercise)  128/80    Blood Pressure (Exit)  122/64    Heart Rate (Admit)  77 bpm    Heart Rate (Exercise)  90 bpm    Heart Rate (Exit)  68 bpm    Rating of Perceived Exertion (Exercise)  13    Symptoms  none    Duration  Progress to 30 minutes of  aerobic without signs/symptoms of physical distress    Intensity  THRR unchanged      Progression   Progression  Continue to progress workloads to maintain intensity without signs/symptoms of physical distress.    Average METs  2.4      Resistance Training   Training Prescription  No Relaxation today      Interval Training   Interval Training  No      Bike   Level  0.7    Minutes  10    METs  2.56      NuStep   Level  3    SPM  80    Minutes  10    METs  1.8      Track   Laps  11    Minutes  10    METs  2.92       Nutrition:  Target Goals: Understanding of nutrition guidelines, daily intake of sodium 1500mg , cholesterol 200mg , calories 30% from fat and 7% or less from saturated fats, daily to have 5 or more servings of fruits and vegetables.  Biometrics: Pre Biometrics - 11/15/17 1201      Pre Biometrics   Height  5' 6.5" (1.689 m)    Weight  185 lb 6.5 oz (84.1 kg)    Waist  Circumference  41 inches    Hip Circumference  40 inches    Waist to Hip Ratio  1.02 %    BMI (Calculated)  29.48    Triceps Skinfold  15 mm    % Body Fat  28.8 %    Grip Strength  36 kg    Flexibility  7.5 in    Single Leg Stand  30 seconds        Nutrition Therapy Plan and Nutrition Goals: Nutrition Therapy & Goals - 11/15/17 1533      Nutrition Therapy   Diet  Carb Modified, Heart Healthy      Personal Nutrition Goals   Nutrition Goal  Pt to identify and limit food sources of saturated fat, trans fat, and sodium    Personal Goal #2  Pt to identify food quantities necessary to achieve weight loss of 6-10 lb  at graduation from cardiac rehab.      Intervention Plan   Intervention  Prescribe, educate and counsel regarding individualized specific dietary modifications aiming towards targeted core components such as weight, hypertension, lipid management, diabetes, heart failure and other comorbidities.    Expected Outcomes  Short Term Goal: Understand basic principles of dietary content, such as calories, fat, sodium, cholesterol and nutrients.;Long Term Goal: Adherence to prescribed nutrition plan.       Nutrition Discharge: Nutrition Scores: Nutrition Assessments - 11/15/17 1533      MEDFICTS Scores   Pre Score  72       Nutrition Goals Re-Evaluation:   Nutrition Goals Re-Evaluation:   Nutrition Goals Discharge (Final Nutrition Goals Re-Evaluation):   Psychosocial: Target Goals: Acknowledge presence or absence of significant depression and/or stress, maximize coping skills, provide positive support system. Participant is able to verbalize types and ability to use techniques and skills needed for reducing stress and depression.  Initial Review & Psychosocial Screening: Initial Psych Review & Screening - 11/15/17 1159      Initial Review   Current issues with  None Identified      Family Dynamics   Good Support System?  Yes    Comments  no psychosocial needs  identified, no interventions necessary       Barriers   Psychosocial barriers to participate in program  There are no identifiable barriers or psychosocial needs.      Screening Interventions   Interventions  Encouraged to exercise       Quality of Life Scores: Quality of Life - 11/15/17 1033      Quality of Life Scores   Health/Function Pre  26.6 %    Socioeconomic Pre  24.6 %    Psych/Spiritual Pre  27.7 %    Family Pre  28.8 %    GLOBAL Pre  26.69 %  PHQ-9: Recent Review Flowsheet Data    Depression screen [facility] 2/9 06/14/2017   Decreased Interest 0   Down, Depressed, Hopeless 0   PHQ - 2 Score 0     Interpretation of Total Score  Total Score Depression Severity:  1-4 = Minimal depression, 5-9 = Mild depression, 10-14 = Moderate depression, 15-19 = Moderately severe depression, 20-27 = Severe depression   Psychosocial Evaluation and Intervention:   Psychosocial Re-Evaluation:   Psychosocial Discharge (Final Psychosocial Re-Evaluation):   Vocational Rehabilitation: Provide vocational rehab assistance to qualifying candidates.   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 Participant will state understanding/return demonstration of topics presented.  Learning Barriers/Preferences: Learning Barriers/Preferences - 11/15/17 1057      Learning Barriers/Preferences   Learning Barriers  Reading;Sight       Education Topics: Count Your Pulse:  -Group instruction provided by verbal instruction, demonstration, patient participation and written materials to support subject.  Instructors address importance of being able to find your pulse and how to count your  pulse when at home without a heart monitor.  Patients get hands on experience counting their pulse with staff help and individually.   Heart Attack, Angina, and Risk Factor Modification:  -Group instruction provided by verbal instruction, video, and written materials to support subject.  Instructors address signs and symptoms of angina and heart attacks.    Also discuss risk factors for heart disease and how to make changes to improve heart health risk factors.   Functional Fitness:  -Group instruction provided by verbal instruction, demonstration, patient participation, and written materials to support subject.  Instructors address safety measures for doing things around the house.  Discuss how to get up and down off the floor, how to pick things up properly, how to safely get out of a chair without assistance, and balance training.   Meditation and Mindfulness:  -Group instruction provided by verbal instruction, patient participation, and written materials to support subject.  Instructor addresses importance of mindfulness and meditation practice to help reduce stress and improve awareness.  Instructor also leads participants through a meditation exercise.    Stretching for Flexibility and Mobility:  -Group instruction provided by verbal instruction, patient participation, and written materials to support subject.  Instructors lead participants through series of stretches that are designed to increase flexibility thus improving mobility.  These stretches are additional exercise for major muscle groups that are typically performed during regular warm up and cool down.   Hands Only CPR:  -Group verbal, video, and participation provides a basic overview of AHA guidelines for community CPR. Role-play of emergencies allow participants the opportunity to practice calling for help and chest compression technique with discussion of AED use.   Hypertension: -Group verbal and written instruction that  provides a basic overview of hypertension including the most recent diagnostic guidelines, risk factor reduction with self-care instructions and medication management.    Nutrition I class: Heart Healthy Eating:  -Group instruction provided by PowerPoint slides, verbal discussion, and written materials to support subject matter. The instructor gives an explanation and review of the Therapeutic Lifestyle Changes diet recommendations, which includes a discussion on lipid goals, dietary fat, sodium, fiber, plant stanol/sterol esters, sugar, and the components  of a well-balanced, healthy diet.   Nutrition II class: Lifestyle Skills:  -Group instruction provided by PowerPoint slides, verbal discussion, and written materials to support subject matter. The instructor gives an explanation and review of label reading, grocery shopping for heart health, heart healthy recipe modifications, and ways to make healthier choices when eating out.   Diabetes Question & Answer:  -Group instruction provided by PowerPoint slides, verbal discussion, and written materials to support subject matter. The instructor gives an explanation and review of diabetes co-morbidities, pre- and post-prandial blood glucose goals, pre-exercise blood glucose goals, signs, symptoms, and treatment of hypoglycemia and hyperglycemia, and foot care basics.   Diabetes Blitz:  -Group instruction provided by PowerPoint slides, verbal discussion, and written materials to support subject matter. The instructor gives an explanation and review of the physiology behind type 1 and type 2 diabetes, diabetes medications and rational behind using different medications, pre- and post-prandial blood glucose recommendations and Hemoglobin A1c goals, diabetes diet, and exercise including blood glucose guidelines for exercising safely.    Portion Distortion:  -Group instruction provided by PowerPoint slides, verbal discussion, written materials, and food  models to support subject matter. The instructor gives an explanation of serving size versus portion size, changes in portions sizes over the last 20 years, and what consists of a serving from each food group.   Stress Management:  -Group instruction provided by verbal instruction, video, and written materials to support subject matter.  Instructors review role of stress in heart disease and how to cope with stress positively.     Exercising on Your Own:  -Group instruction provided by verbal instruction, power point, and written materials to support subject.  Instructors discuss benefits of exercise, components of exercise, frequency and intensity of exercise, and end points for exercise.  Also discuss use of nitroglycerin and activating EMS.  Review options of places to exercise outside of rehab.  Review guidelines for sex with heart disease.   Cardiac Drugs I:  -Group instruction provided by verbal instruction and written materials to support subject.  Instructor reviews cardiac drug classes: antiplatelets, anticoagulants, beta blockers, and statins.  Instructor discusses reasons, side effects, and lifestyle considerations for each drug class.   Cardiac Drugs II:  -Group instruction provided by verbal instruction and written materials to support subject.  Instructor reviews cardiac drug classes: angiotensin converting enzyme inhibitors (ACE-I), angiotensin II receptor blockers (ARBs), nitrates, and calcium channel blockers.  Instructor discusses reasons, side effects, and lifestyle considerations for each drug class.   Anatomy and Physiology of the Circulatory System:  Group verbal and written instruction and models provide basic cardiac anatomy and physiology, with the coronary electrical and arterial systems. Review of: AMI, Angina, Valve disease, Heart Failure, Peripheral Artery Disease, Cardiac Arrhythmia, Pacemakers, and the ICD.   CARDIAC REHAB PHASE II EXERCISE from 11/23/2017 in [facility] CARDIAC REHAB  Date  11/23/17  Instruction Review Code  2- meets goals/outcomes      Other Education:  -Group or individual verbal, written, or video instructions that support the educational goals of the cardiac rehab program.   Knowledge Questionnaire Score: Knowledge Questionnaire Score - 11/15/17 0916      Knowledge Questionnaire Score   Pre Score  15/24       Core Components/Risk Factors/Patient Goals at Admission: Personal Goals and Risk Factors at Admission - 11/15/17 1122      Core Components/Risk Factors/Patient Goals on Admission    Weight Management  Yes;Weight Maintenance;Weight Loss  Intervention  Weight Management: Develop a combined nutrition and exercise program designed to reach desired caloric intake, while maintaining appropriate intake of nutrient and fiber, sodium and fats, and appropriate energy expenditure required for the weight goal.;Weight Management/Obesity: Establish reasonable short term and long term weight goals.;Weight Management: Provide education and appropriate resources to help participant work on and attain dietary goals.    Admit Weight  185 lb 6.5 oz (84.1 kg)    Goal Weight: Short Term  180 lb (81.6 kg)    Goal Weight: Long Term  175 lb (79.4 kg)    Expected Outcomes  Short Term: Continue to assess and modify interventions until short term weight is achieved;Long Term: Adherence to nutrition and physical activity/exercise program aimed toward attainment of established weight goal;Weight Maintenance: Understanding of the daily nutrition guidelines, which includes 25-35% calories from fat, 7% or less cal from saturated fats, less than 200mg  cholesterol, less than 1.5gm of sodium, & 5 or more servings of fruits and vegetables daily;Weight Loss: Understanding of general recommendations for a balanced deficit meal plan, which promotes 1-2 lb weight loss per week and includes a negative energy balance of [phone]  kcal/d;Understanding recommendations for meals to include 15-35% energy as protein, 25-35% energy from fat, 35-60% energy from carbohydrates, less than 200mg  of dietary cholesterol, 20-35 gm of total fiber daily;Understanding of distribution of calorie intake throughout the day with the consumption of 4-5 meals/snacks    Diabetes  Yes    Intervention  Provide education about signs/symptoms and action to take for hypo/hyperglycemia.;Provide education about proper nutrition, including hydration, and aerobic/resistive exercise prescription along with prescribed medications to achieve blood glucose in normal ranges: Fasting glucose 65-99 mg/dL    Expected Outcomes  Short Term: Participant verbalizes understanding of the signs/symptoms and immediate care of hyper/hypoglycemia, proper foot care and importance of medication, aerobic/resistive exercise and nutrition plan for blood glucose control.;Long Term: Attainment of HbA1C < 7%.    Hypertension  Yes    Intervention  Provide education on lifestyle modifcations including regular physical activity/exercise, weight management, moderate sodium restriction and increased consumption of fresh fruit, vegetables, and low fat dairy, alcohol moderation, and smoking cessation.;Monitor prescription use compliance.    Expected Outcomes  Short Term: Continued assessment and intervention until BP is < 140/3590mm HG in hypertensive participants. < 130/3180mm HG in hypertensive participants with diabetes, heart failure or chronic kidney disease.;Long Term: Maintenance of blood pressure at goal levels.    Lipids  Yes    Intervention  Provide education and support for participant on nutrition & aerobic/resistive exercise along with prescribed medications to achieve LDL 70mg , HDL >40mg .    Expected Outcomes  Short Term: Participant states understanding of desired cholesterol values and is compliant with medications prescribed. Participant is following exercise prescription and  nutrition guidelines.;Long Term: Cholesterol controlled with medications as prescribed, with individualized exercise RX and with personalized nutrition plan. Value goals: LDL < 70mg , HDL > 40 mg.       Core Components/Risk Factors/Patient Goals Review:    Core Components/Risk Factors/Patient Goals at Discharge (Final Review):    ITP Comments: ITP Comments    Row Name 11/15/17 0902 11/25/17 1647         ITP Comments  Dr. Armanda Magicraci Turner, Medical Director   30 day ITP review. Patient with good participation and attendance at cardiac rehab.         Comments: Maximilien started exercise at cardiac rehab this week and is off to a good start.[place] LighterMaria Whitaker, RN,BSN  11/25/2017 4:49 PM

## 2017-11-27 NOTE — Progress Notes (Signed)
Electrophysiology Office Note Date: 11/30/2017  ID:  Jonathan Brown, DOB February 06, 1951, MRN [id number]  PCP: Jac Canavanysinger, David S, PA-C Primary Cardiologist: Jacinto HalimGanji Electrophysiologist: Ladona Ridgelaylor  CC: Pacemaker follow-up  Jonathan Brown is a 66 y.o. male seen today for Dr Ladona Ridgelaylor.  He presents today for routine electrophysiology followup.  Since last being seen in our clinic, the patient reports doing very well.  He is participating in cardiac rehab without chest pain or shortness of breath. He denies chest pain, palpitations, dyspnea, PND, orthopnea, nausea, vomiting, dizziness, syncope, edema, weight gain, or early satiety.  Device History: MDT dual chamber (His Bundle) PPM implanted 2018 for complete heart block post AVR   Past Medical History:  Diagnosis Date  . Aortic stenosis    Dr. Jacinto HalimGanji  . Arthritis   . Coronary artery disease 07/12/2017   Non occlusive CAD from cardiac catherization   . Diabetes mellitus without complication (HCC) 2014   type 2   . Former smoker   . GERD (gastroesophageal reflux disease)   . Hyperlipidemia   . Hypertension 2012  . PVD (peripheral vascular disease) (HCC)   . 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 AORTIC VALVE REPLACEMENT N/A 09/29/2017   Procedure: AORTIC VALVE REPLACEMENT (AVR) using 21mm Magna Ease valve;  Surgeon: Kerin PernaVan Trigt, Peter, MD;  [facility];  Service: Open Heart Surgery;  Laterality: N/A;  . CARDIAC CATHETERIZATION  07/12/2017  . COLONOSCOPY  2008  . CORONARY ARTERY BYPASS GRAFT    . LEFT AND RIGHT HEART CATHETERIZATION WITH CORONARY ANGIOGRAM N/A 04/02/2014   Procedure: LEFT AND RIGHT HEART CATHETERIZATION WITH CORONARY ANGIOGRAM;  Surgeon: Pamella PertJagadeesh R Ganji, MD;  [facility];  Service: Cardiovascular;  Laterality: N/A;  . PACEMAKER IMPLANT N/A 10/03/2017   Procedure: PACEMAKER IMPLANT;  Surgeon: Marinus Mawaylor, Gregg W, MD;  [facility]: [facility] INVASIVE  CV LAB;  Service: Cardiovascular;  Laterality: N/A;  . TEE WITHOUT CARDIOVERSION N/A 04/30/2014   Procedure: TRANSESOPHAGEAL ECHOCARDIOGRAM (TEE);  Surgeon: Pamella PertJagadeesh R Ganji, MD;  [facility];  Service: Cardiovascular;  Laterality: N/A;  . TEE WITHOUT CARDIOVERSION N/A 09/29/2017   Procedure: TRANSESOPHAGEAL ECHOCARDIOGRAM (TEE);  Surgeon: Donata ClayVan Trigt, Theron AristaPeter, MD;  [facility];  Service: Open Heart Surgery;  Laterality: N/A;    Current Outpatient Medications  Medication Sig Dispense Refill  . acetaminophen (TYLENOL) 500 MG tablet Take 1,000 mg by mouth daily as needed for moderate pain.    Marland Kitchen. aspirin 325 MG EC tablet Take 1 tablet (325 mg total) by mouth daily. 30 tablet 0  . losartan (COZAAR) 100 MG tablet Take 100 mg by mouth daily.    Marland Kitchen. lovastatin (MEVACOR) 40 MG tablet Take 1 tablet (40 mg total) by mouth at bedtime. 90 tablet 3  . metFORMIN (GLUCOPHAGE) 500 MG tablet TAKE ONE TABLET BY MOUTH TWICE DAILY WITH  MEAL 180 tablet 3  . metoprolol tartrate (LOPRESSOR) 50 MG tablet Take 1 tablet (50 mg total) by mouth 2 (two) times daily. 60 tablet 1  . Multiple Vitamins-Minerals (MULTIVITAMIN WITH MINERALS) tablet Take 1 tablet by mouth daily.    Marland Kitchen. omeprazole (PRILOSEC) 40 MG capsule Take 1 capsule (40 mg total) by mouth daily. 90 capsule 3  . oxyCODONE (OXY IR/ROXICODONE) 5 MG immediate release tablet Take 1 tablet (5 mg total) every 6 (six) hours as needed by mouth for severe pain. (Patient taking differently: Take  5 mg by mouth every 6 (six) hours as needed for severe pain (taking as needed). ) 30 tablet 0  . oxymetazoline (AFRIN) 0.05 % nasal spray Place 1 spray into both nostrils daily as needed for congestion.    . Tetrahydrozoline HCl (VISINE OP) Apply 1 drop to eye daily as needed (dry eyes).     No current facility-administered medications for this visit.     Allergies:   Patient has no known allergies.   Social History: Social History   Socioeconomic History  . Marital  status: Married    Spouse name: Not on file  . Number of children: Not on file  . Years of education: Not on file  . Highest education level: Not on file  Social Needs  . Financial resource strain: Not on file  . Food insecurity - worry: Not on file  . Food insecurity - inability: Not on file  . Transportation needs - medical: Not on file  . Transportation needs - non-medical: Not on file  Occupational History  . Not on file  Tobacco Use  . Smoking status: Former Smoker    Last attempt to quit: 09/28/2007    Years since quitting: 10.1  . Smokeless tobacco: Never Used  Substance and Sexual Activity  . Alcohol use: No  . Drug use: Yes    Frequency: 7.0 times per week    Types: Marijuana    Comment: patient says he smokes marajuana once a day  . Sexual activity: Not on file  Other Topics Concern  . Not on file  Social History Narrative  . Not on file    Family History: Family History  Problem Relation Age of Onset  . Other Mother        died of old age  . Heart disease Father   . Sleep apnea Sister   . Diabetes Brother        diabetic coma x 3 years  . Alcohol abuse Brother   . Heart disease Brother 48       MI  . Cancer Neg Hx   . Stroke Neg Hx      Review of Systems: All other systems reviewed and are otherwise negative except as noted above.   Physical Exam: VS:  BP 138/80   Pulse 77   Ht 5\' 9"  (1.753 m)   Wt 191 lb 3.2 oz (86.7 kg)   SpO2 97%   BMI 28.24 kg/m  , BMI Body mass index is 28.24 kg/m.  GEN- The patient is well appearing, alert and oriented x 3 today.   HEENT: normocephalic, atraumatic; sclera clear, conjunctiva pink; hearing intact; oropharynx clear; neck supple  Lungs- Clear to ausculation bilaterally, normal work of breathing.  No wheezes, rales, rhonchi Heart- Regular rate and rhythm GI- soft, non-tender, non-distended, bowel sounds present  Extremities- no clubbing, cyanosis, or edema  MS- no significant deformity or atrophy Skin-  warm and dry, no rash or lesion; PPM pocket well healed Psych- euthymic mood, full affect Neuro- strength and sensation are intact  PPM Interrogation- reviewed in detail today,  See PACEART report  EKG:  EKG is ordered today. The ekg ordered today shows sinus rhythm with V pacing  Recent Labs: 06/14/2017: TSH 2.16 09/30/2017: Magnesium 2.4 10/02/2017: ALT 20 10/05/2017: BUN 18; Creatinine, Ser 1.02; Hemoglobin 9.8; Platelets 177; Potassium 4.3; Sodium 139   Wt Readings from Last 3 Encounters:  11/30/17 191 lb 3.2 oz (86.7 kg)  11/15/17 185 lb 6.5 oz (84.1  kg)  11/02/17 187 lb 12.8 oz (85.2 kg)     Other studies Reviewed: Additional studies/ records that were reviewed today include: hospital records    Assessment and Plan:  1.  Complete heart block Normal PPM function - pt is device dependent today  See Pace Art report No changes today His Bundle capture non selective down to loss of capture   2.  Post AVR Per Dr Jetty PeeksGanji/TCTS  3.  HTN Stable No change required today    Current medicines are reviewed at length with the patient today.   The patient does not have concerns regarding his medicines.  The following changes were made today:  none  Labs/ tests ordered today include: none Orders Placed This Encounter  Procedures  . CUP PACEART INCLINIC DEVICE CHECK  . EKG 12-Lead     Disposition:   Follow up with Dr Ladona Ridgelaylor as scheduled     Signed, Gypsy BalsamAmber Devaun Hernandez, NP 11/30/2017 10:31 AM  [facility] [address] [place] [address] [phone] (office) [phone] (fax)

## 2017-11-28 ENCOUNTER — Encounter (HOSPITAL_COMMUNITY)
Admission: RE | Admit: 2017-11-28 | Discharge: 2017-11-28 | Disposition: A | Discharge status: Home / Self Care | Payer: Medicare HMO | Source: Ambulatory Visit | Attending: Cardiology | Admitting: Cardiology

## 2017-11-28 DIAGNOSIS — Z952 Presence of prosthetic heart valve: Secondary | ICD-10-CM | POA: Diagnosis present

## 2017-11-28 DIAGNOSIS — Z48812 Encounter for surgical aftercare following surgery on the circulatory system: Secondary | ICD-10-CM | POA: Diagnosis not present

## 2017-11-28 LAB — GLUCOSE, CAPILLARY: GLUCOSE-CAPILLARY: 185 mg/dL — AB (ref 65–99)

## 2017-11-30 ENCOUNTER — Encounter: Payer: Self-pay | Admitting: Nurse Practitioner

## 2017-11-30 ENCOUNTER — Ambulatory Visit: Payer: Medicare HMO | Admitting: Nurse Practitioner

## 2017-11-30 ENCOUNTER — Encounter (INDEPENDENT_AMBULATORY_CARE_PROVIDER_SITE_OTHER): Payer: Self-pay

## 2017-11-30 VITALS — BP 138/80 | HR 77 | Ht 69.0 in | Wt 191.2 lb

## 2017-11-30 DIAGNOSIS — I442 Atrioventricular block, complete: Secondary | ICD-10-CM | POA: Diagnosis not present

## 2017-11-30 LAB — CUP PACEART INCLINIC DEVICE CHECK
Implantable Lead Implant Date: 20181008
Implantable Lead Implant Date: 20181008
Implantable Lead Location: 753860
Implantable Pulse Generator Implant Date: 20181008
MDC IDC LEAD LOCATION: 753859
MDC IDC SESS DTM: 20181205100448

## 2017-11-30 NOTE — Patient Instructions (Signed)
Medication Instructions:   Your physician recommends that you continue on your current medications as directed. Please refer to the Current Medication list given to you today.   If you need a refill on your cardiac medications before your next appointment, please call your pharmacy.  Labwork: NONE ORDERED  TODAY    Testing/Procedures: NONE ORDERED  TODAY   Follow-Up:  AS SCHEDULED WITH DR Ladona RidgelAYLOR    Any Other Special Instructions Will Be Listed Below (If Applicable).

## 2017-12-02 ENCOUNTER — Encounter (HOSPITAL_COMMUNITY)
Admission: RE | Admit: 2017-12-02 | Discharge: 2017-12-02 | Disposition: A | Discharge status: Home / Self Care | Payer: Medicare HMO | Source: Ambulatory Visit | Attending: Cardiology | Admitting: Cardiology

## 2017-12-02 DIAGNOSIS — Z48812 Encounter for surgical aftercare following surgery on the circulatory system: Secondary | ICD-10-CM | POA: Diagnosis not present

## 2017-12-02 DIAGNOSIS — Z952 Presence of prosthetic heart valve: Secondary | ICD-10-CM

## 2017-12-02 NOTE — Progress Notes (Signed)
Reviewed home exercise guidelines with patient including endpoints, temperature precautions, target heart rate and rate of perceived exertion. Pt is currently walking 30 minutes, 1 day/week as his mode of home exercise. Pt voices understanding of instructions given. Artist Paislinty M Delphine Sizemore, MS, ACSM CEP

## 2017-12-05 ENCOUNTER — Encounter (HOSPITAL_COMMUNITY): Payer: Medicare HMO

## 2017-12-07 ENCOUNTER — Encounter (HOSPITAL_COMMUNITY)
Admission: RE | Admit: 2017-12-07 | Discharge: 2017-12-07 | Disposition: A | Discharge status: Home / Self Care | Payer: Medicare HMO | Source: Ambulatory Visit | Attending: Cardiology | Admitting: Cardiology

## 2017-12-07 DIAGNOSIS — Z48812 Encounter for surgical aftercare following surgery on the circulatory system: Secondary | ICD-10-CM | POA: Diagnosis not present

## 2017-12-07 DIAGNOSIS — Z952 Presence of prosthetic heart valve: Secondary | ICD-10-CM

## 2017-12-07 LAB — GLUCOSE, CAPILLARY: Glucose-Capillary: 173 mg/dL — ABNORMAL HIGH (ref 65–99)

## 2017-12-07 NOTE — Progress Notes (Signed)
Jonathan FinnerRichard G Brown 66 y.o. male DOB: 08-27-1951 MRN: [id number]      Nutrition Note  1. S/P AVR 09/29/17    Meds reviewed. Metformin noted  CBG (last 3)  Recent Labs    12/07/17 0949  GLUCAP 173*   Nutrition Note Spoke with pt.  Nutrition Plan and Nutrition Survey goals reviewed with pt. Pt is working toward following a [employer] diet. Per discussion, pt states "my wife is the one changing my diet." Last A1c indicates blood glucose well-controlled despite pt drinking regular soda. Pt reports he has cut down on his "candy" intake. Pt expressed understanding of the information reviewed. Pt aware of nutrition education classes offered. Pt low literacy level noted. Pt's wife reads materials and "tells me what I need to know" per pt.    Nutrition Diagnosis ? Food-and nutrition-related knowledge deficit related to lack of exposure to information as related to diagnosis of: ? CVD ? Overweight related to excessive energy intake as evidenced by a BMI of 29.5  Nutrition Intervention ? Pt's individual nutrition plan and goals reviewed with pt. ? Benefits of adopting Heart Healthy diet discussed when Medficts reviewed.    Nutrition Goal(s):  ? Pt to identify and limit food sources of saturated fat, trans fat, and sodium ? Pt to identify food quantities necessary to achieve weight loss of 6-10 lb  at graduation from cardiac rehab.  Plan:  Pt to attend nutrition classes ? Nutrition I ? Nutrition II ? Portion Distortion ? Diabetes Blitz ? Diabetes Q & A Will provide client-centered nutrition education as part of interdisciplinary care.   Monitor and evaluate progress toward nutrition goal with team.  Jonathan PlumbEdna Winifred Brown, M.Ed, RD, LDN, CDE 12/07/2017 10:47 AM

## 2017-12-09 ENCOUNTER — Encounter (HOSPITAL_COMMUNITY)
Admission: RE | Admit: 2017-12-09 | Discharge: 2017-12-09 | Disposition: A | Discharge status: Home / Self Care | Payer: Medicare HMO | Source: Ambulatory Visit | Attending: Cardiology | Admitting: Cardiology

## 2017-12-09 DIAGNOSIS — Z48812 Encounter for surgical aftercare following surgery on the circulatory system: Secondary | ICD-10-CM | POA: Diagnosis not present

## 2017-12-09 DIAGNOSIS — Z952 Presence of prosthetic heart valve: Secondary | ICD-10-CM

## 2017-12-09 NOTE — Progress Notes (Signed)
[name]'s resting diastolic blood have been in the upper 80's to low 90's the past few sessions. Jonathan Brown denies any complaints. Jonathan Brown says his is watching his salt intake but does not always follow a heart healthy diet. Will fax exercise flow sheets to Dr. Damian LeavellPatwardhan's office for review. Jonathan Brown denies any complaints with exercise today.Jonathan LighterMaria Zekiah Coen, RN,BSN 12/12/2017 2:12 PM

## 2017-12-12 ENCOUNTER — Encounter (HOSPITAL_COMMUNITY)
Admission: RE | Admit: 2017-12-12 | Discharge: 2017-12-12 | Disposition: A | Discharge status: Home / Self Care | Payer: Medicare HMO | Source: Ambulatory Visit | Attending: Cardiology | Admitting: Cardiology

## 2017-12-12 DIAGNOSIS — Z952 Presence of prosthetic heart valve: Secondary | ICD-10-CM

## 2017-12-12 DIAGNOSIS — Z48812 Encounter for surgical aftercare following surgery on the circulatory system: Secondary | ICD-10-CM | POA: Diagnosis not present

## 2017-12-12 LAB — GLUCOSE, CAPILLARY: GLUCOSE-CAPILLARY: 162 mg/dL — AB (ref 65–99)

## 2017-12-14 ENCOUNTER — Encounter (HOSPITAL_COMMUNITY)
Admission: RE | Admit: 2017-12-14 | Discharge: 2017-12-14 | Disposition: A | Discharge status: Home / Self Care | Payer: Medicare HMO | Source: Ambulatory Visit | Attending: Cardiology | Admitting: Cardiology

## 2017-12-14 ENCOUNTER — Ambulatory Visit (INDEPENDENT_AMBULATORY_CARE_PROVIDER_SITE_OTHER): Payer: Self-pay | Admitting: Cardiothoracic Surgery

## 2017-12-14 ENCOUNTER — Other Ambulatory Visit: Payer: Self-pay

## 2017-12-14 ENCOUNTER — Encounter: Payer: Self-pay | Admitting: Cardiothoracic Surgery

## 2017-12-14 VITALS — BP 135/83 | HR 77 | Ht 69.0 in | Wt 185.0 lb

## 2017-12-14 DIAGNOSIS — Z952 Presence of prosthetic heart valve: Secondary | ICD-10-CM

## 2017-12-14 DIAGNOSIS — Z48812 Encounter for surgical aftercare following surgery on the circulatory system: Secondary | ICD-10-CM | POA: Diagnosis not present

## 2017-12-14 LAB — GLUCOSE, CAPILLARY: GLUCOSE-CAPILLARY: 119 mg/dL — AB (ref 65–99)

## 2017-12-14 NOTE — Progress Notes (Signed)
PCP is Tysinger, Kermit Baloavid S, PA-C Referring Provider is Yates DecampGanji, Jay, MD  Chief Complaint  Patient presents with  . Follow-up    HPI: Final postop visit after aortic valve replacement for critical aortic stenosis Patient needed a pacemaker placement for heart block prior to discharge Patient now doing well improved exercise tolerance and he is in the process of bleeding outpatient cardiac rehabilitation phase 2.  Past Medical History:  Diagnosis Date  . Aortic stenosis    Dr. Jacinto HalimGanji  . Arthritis   . Coronary artery disease 07/12/2017   Non occlusive CAD from cardiac catherization   . Diabetes mellitus without complication (HCC) 2014   type 2   . Former smoker   . GERD (gastroesophageal reflux disease)   . Hyperlipidemia   . Hypertension 2012  . PVD (peripheral vascular disease) (HCC)   . 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 AORTIC VALVE REPLACEMENT N/A 09/29/2017   Procedure: AORTIC VALVE REPLACEMENT (AVR) using 21mm Magna Ease valve;  Surgeon: Kerin PernaVan Trigt, Race Latour, MD;  [facility];  Service: Open Heart Surgery;  Laterality: N/A;  . CARDIAC CATHETERIZATION  07/12/2017  . COLONOSCOPY  2008  . CORONARY ARTERY BYPASS GRAFT    . LEFT AND RIGHT HEART CATHETERIZATION WITH CORONARY ANGIOGRAM N/A 04/02/2014   Procedure: LEFT AND RIGHT HEART CATHETERIZATION WITH CORONARY ANGIOGRAM;  Surgeon: Pamella PertJagadeesh R Ganji, MD;  [facility];  Service: Cardiovascular;  Laterality: N/A;  . PACEMAKER IMPLANT N/A 10/03/2017   Procedure: PACEMAKER IMPLANT;  Surgeon: Marinus Mawaylor, Gregg W, MD;  Location: [facility] INVASIVE CV LAB;  Service: Cardiovascular;  Laterality: N/A;  . TEE WITHOUT CARDIOVERSION N/A 04/30/2014   Procedure: TRANSESOPHAGEAL ECHOCARDIOGRAM (TEE);  Surgeon: Pamella PertJagadeesh R Ganji, MD;  [facility];  Service: Cardiovascular;  Laterality: N/A;  . TEE WITHOUT CARDIOVERSION N/A 09/29/2017   Procedure: TRANSESOPHAGEAL  ECHOCARDIOGRAM (TEE);  Surgeon: Donata ClayVan Trigt, Theron AristaPeter, MD;  [facility];  Service: Open Heart Surgery;  Laterality: N/A;    Family History  Problem Relation Age of Onset  . Other Mother        died of old age  . Heart disease Father   . Sleep apnea Sister   . Diabetes Brother        diabetic coma x 3 years  . Alcohol abuse Brother   . Heart disease Brother 48       MI  . Cancer Neg Hx   . Stroke Neg Hx     Social History Social History   Tobacco Use  . Smoking status: Former Smoker    Last attempt to quit: 09/28/2007    Years since quitting: 10.2  . Smokeless tobacco: Never Used  Substance Use Topics  . Alcohol use: No  . Drug use: Yes    Frequency: 7.0 times per week    Types: Marijuana    Comment: patient says he smokes marajuana once a day    Current Outpatient Medications  Medication Sig Dispense Refill  . acetaminophen (TYLENOL) 500 MG tablet Take 1,000 mg by mouth daily as needed for moderate pain.    Marland Kitchen. aspirin 325 MG EC tablet Take 1 tablet (325 mg total) by mouth daily. 30 tablet 0  . losartan (COZAAR) 100 MG tablet Take 100 mg by mouth daily.    Marland Kitchen. lovastatin (MEVACOR) 40 MG tablet Take 1 tablet (40 mg total) by mouth at  bedtime. 90 tablet 3  . metFORMIN (GLUCOPHAGE) 500 MG tablet TAKE ONE TABLET BY MOUTH TWICE DAILY WITH  MEAL 180 tablet 3  . metoprolol tartrate (LOPRESSOR) 50 MG tablet Take 1 tablet (50 mg total) by mouth 2 (two) times daily. 60 tablet 1  . Multiple Vitamins-Minerals (MULTIVITAMIN WITH MINERALS) tablet Take 1 tablet by mouth daily.    Marland Kitchen. omeprazole (PRILOSEC) 40 MG capsule Take 1 capsule (40 mg total) by mouth daily. 90 capsule 3  . oxyCODONE (OXY IR/ROXICODONE) 5 MG immediate release tablet Take 1 tablet (5 mg total) every 6 (six) hours as needed by mouth for severe pain. (Patient taking differently: Take 5 mg by mouth every 6 (six) hours as needed for severe pain (taking as needed). ) 30 tablet 0  . oxymetazoline (AFRIN) 0.05 % nasal spray Place  1 spray into both nostrils daily as needed for congestion.    . Tetrahydrozoline HCl (VISINE OP) Apply 1 drop to eye daily as needed (dry eyes).     No current facility-administered medications for this visit.     No Known Allergies  Review of Systems  No complaints  BP 135/83 (BP Location: Left Arm, Patient Position: Sitting, Cuff Size: Normal)   Pulse 77   Ht 5\' 9"  (1.753 m)   Wt 185 lb (83.9 kg)   SpO2 99%   BMI 27.32 kg/m  Physical Exam      Exam    General- alert and comfortable   Lungs- clear without rales, wheezes   Cor- regular rate and rhythm, no murmur , gallop   Abdomen- soft, non-tender   Extremities - warm, non-tender, minimal edema   Neuro- oriented, appropriate, no focal weakness   Diagnostic Tests:  None Impression: Patient has recovered after aVR for critical aortic stenosis  Plan: No limitations on activities or lifting after January 1. Patient will return as needed.  Mikey BussingPeter Van Trigt III, MD [facility] [phone]

## 2017-12-16 ENCOUNTER — Encounter (HOSPITAL_COMMUNITY)
Admission: RE | Admit: 2017-12-16 | Discharge: 2017-12-16 | Disposition: A | Discharge status: Home / Self Care | Payer: Medicare HMO | Source: Ambulatory Visit | Attending: Cardiology | Admitting: Cardiology

## 2017-12-16 DIAGNOSIS — Z952 Presence of prosthetic heart valve: Secondary | ICD-10-CM

## 2017-12-16 DIAGNOSIS — Z48812 Encounter for surgical aftercare following surgery on the circulatory system: Secondary | ICD-10-CM | POA: Diagnosis not present

## 2017-12-16 LAB — GLUCOSE, CAPILLARY: Glucose-Capillary: 227 mg/dL — ABNORMAL HIGH (ref 65–99)

## 2017-12-21 ENCOUNTER — Encounter (HOSPITAL_COMMUNITY)
Admission: RE | Admit: 2017-12-21 | Discharge: 2017-12-21 | Disposition: A | Discharge status: Home / Self Care | Payer: Medicare HMO | Source: Ambulatory Visit | Attending: Cardiology | Admitting: Cardiology

## 2017-12-21 DIAGNOSIS — Z952 Presence of prosthetic heart valve: Secondary | ICD-10-CM

## 2017-12-21 DIAGNOSIS — Z48812 Encounter for surgical aftercare following surgery on the circulatory system: Secondary | ICD-10-CM | POA: Diagnosis not present

## 2017-12-22 NOTE — Progress Notes (Signed)
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  acetaminophen (TYLENOL) 500 MG tablet, Take 1,000 mg by mouth daily as needed for moderate pain., Disp: , Rfl:  .  aspirin 325 MG EC tablet, Take 1 tablet (325 mg total) by mouth daily., Disp: 30 tablet, Rfl: 0 .  losartan (COZAAR) 100 MG tablet, Take 100 mg by mouth daily., Disp: , Rfl:  .  lovastatin (MEVACOR) 40 MG tablet, Take 1 tablet (40 mg total) by mouth at bedtime., Disp: 90 tablet, Rfl: 3 .  metFORMIN (GLUCOPHAGE) 500 MG tablet, TAKE ONE TABLET BY MOUTH TWICE DAILY WITH  MEAL, Disp: 180 tablet, Rfl: 3 .  metoprolol tartrate (LOPRESSOR) 50 MG tablet, Take 1 tablet (50 mg total) by mouth 2 (two) times daily., Disp: 60 tablet, Rfl: 1 .  Multiple Vitamins-Minerals (MULTIVITAMIN WITH MINERALS) tablet, Take 1 tablet by mouth daily., Disp: , Rfl:  .  omeprazole (PRILOSEC) 40 MG capsule, Take 1 capsule (40 mg total) by mouth daily., Disp: 90 capsule, Rfl: 3 .  oxyCODONE (OXY IR/ROXICODONE) 5 MG immediate release tablet, Take 1 tablet (5 mg total) every 6 (six) hours as needed by mouth for severe pain. (Patient taking differently: Take 5 mg by mouth every 6 (six) hours as needed for severe pain (taking as needed). ), Disp: 30 tablet, Rfl: 0 .  oxymetazoline (AFRIN) 0.05 % nasal spray, Place 1 spray into both nostrils daily as needed for congestion., Disp: , Rfl:  .  Tetrahydrozoline HCl (VISINE OP), Apply  1 drop to eye daily as needed (dry eyes)., Disp: , Rfl:   Past Medical History: Past Medical History:  Diagnosis Date  . Aortic stenosis    Dr. Jacinto Halim  . Arthritis   . Coronary artery disease 07/12/2017   Non occlusive CAD from cardiac catherization   . Diabetes mellitus without complication (HCC) 2014   type 2   . Former smoker   . GERD (gastroesophageal reflux disease)   . Hyperlipidemia   . Hypertension 2012  . PVD (peripheral vascular disease) (HCC)   . 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 Last attempt to quit: 09/28/2007  . Years since quitting: 10.2  Smokeless Tobacco Never Used    Labs: Recent Review Flowsheet Data    Labs for ITP Cardiac and Pulmonary Rehab Latest Ref Rng & Units 09/29/2017 09/29/2017 09/29/2017 10/01/2017 10/02/2017   Cholestrol <200 mg/dL - - - - -   LDLCALC <191 mg/dL - - - - -   HDL >47 mg/dL - - - - -   Trlycerides <150 mg/dL - - - - -   Hemoglobin A1c 4.8 - 5.6 % - - - - -   PHART 7.350 - 7.450 7.408 - 7.345(L) - -   PCO2ART 32.0 - 48.0 mmHg 39.3 - 36.2 - -  HCO3 20.0 - 28.0 mmol/L 24.8 - 19.6(L) - -   TCO2 22 - 32 mmol/L 26 23 21(L) 23 23   ACIDBASEDEF 0.0 - 2.0 mmol/L - - 5.0(H) - -   O2SAT % 97.0 - 96.0 - -      Capillary Blood Glucose: Lab Results  Component Value Date   GLUCAP 227 (H) 12/16/2017   GLUCAP 119 (H) 12/14/2017   GLUCAP 162 (H) 12/12/2017   GLUCAP 173 (H) 12/07/2017   GLUCAP 185 (H) 11/28/2017     Exercise Target Goals:    Exercise Program Goal: Individual exercise prescription set with THRR, safety & activity barriers. Participant demonstrates ability to understand and report RPE using BORG scale, to self-measure pulse accurately, and to acknowledge the importance of the exercise prescription.  Exercise Prescription Goal: Starting with aerobic activity 30 plus minutes a day, 3 days per week for initial exercise  prescription. Provide home exercise prescription and guidelines that participant acknowledges understanding prior to discharge.  Activity Barriers & Risk Stratification: Activity Barriers & Cardiac Risk Stratification - 11/15/17 0913      Activity Barriers & Cardiac Risk Stratification   Activity Barriers  Back Problems;Deconditioning;Muscular Weakness    Cardiac Risk Stratification  High       6 Minute Walk: 6 Minute Walk    Row Name 11/15/17 1117         6 Minute Walk   Phase  Initial     Distance  1440 feet     Walk Time  6 minutes     # of Rest Breaks  0     MPH  2.73     METS  3.33     RPE  13     VO2 Peak  11.66     Symptoms  Yes (comment)     Comments  B leg pain     Resting HR  74 bpm     Resting BP  128/80     Resting Oxygen Saturation   98 %     Exercise Oxygen Saturation  during 6 min walk  9 %     Max Ex. HR  97 bpm     Max Ex. BP  140/80     2 Minute Post BP  118/78        Oxygen Initial Assessment:   Oxygen Re-Evaluation:   Oxygen Discharge (Final Oxygen Re-Evaluation):   Initial Exercise Prescription: Initial Exercise Prescription - 11/15/17 1100      Date of Initial Exercise RX and Referring Provider   Date  11/15/17    Referring Provider  [name], [name]      Bike   Level  0.7    Minutes  10    METs  2.55      NuStep   Level  30    SPM  80    Minutes  10    METs  2.5      Track   Laps  10    Minutes  10    METs  2.74      Prescription Details   Frequency (times per week)  3    Duration  Progress to 30 minutes of continuous aerobic without signs/symptoms of physical distress      Intensity   THRR 40-80% of Max Heartrate  62-124    Ratings of Perceived Exertion  11-13    Perceived Dyspnea  0-4      Progression   Progression  Continue to  progress workloads to maintain intensity without signs/symptoms of physical distress.      Resistance Training   Training Prescription  Yes    Weight  3lbs    Reps  10-15        Perform Capillary Blood Glucose checks as needed.  Exercise Prescription Changes:  Exercise Prescription Changes    Row Name 11/23/17 0948 12/07/17 1100 12/21/17 1100         Response to Exercise   Blood Pressure (Admit)  124/80  138/88  154/78     Blood Pressure (Exercise)  128/80  132/78  150/90     Blood Pressure (Exit)  122/64  132/90  118/84     Heart Rate (Admit)  77 bpm  87 bpm  93 bpm     Heart Rate (Exercise)  90 bpm  105 bpm  102 bpm     Heart Rate (Exit)  68 bpm  85 bpm  76 bpm     Rating of Perceived Exertion (Exercise)  13  11  12      Symptoms  none  none  none     Duration  Progress to 30 minutes of  aerobic without signs/symptoms of physical distress  Progress to 30 minutes of  aerobic without signs/symptoms of physical distress  Continue with 30 min of aerobic exercise without signs/symptoms of physical distress.     Intensity  THRR unchanged  THRR unchanged  THRR unchanged       Progression   Progression  Continue to progress workloads to maintain intensity without signs/symptoms of physical distress.  Continue to progress workloads to maintain intensity without signs/symptoms of physical distress.  Continue to progress workloads to maintain intensity without signs/symptoms of physical distress.     Average METs  2.4  2.4  3.1       Resistance Training   Training Prescription  No Relaxation today  Yes  Yes     Weight  -  3lbs  3lbs     Reps  -  10-15  10-15     Time  -  10 Minutes  10 Minutes       Interval Training   Interval Training  No  No  No       Bike   Level  0.7  0.8  1.1     Minutes  10  10  10      METs  2.56  2.76  3.38       NuStep   Level  3  4  4      SPM  80  60  60     Minutes  10  10  10      METs  1.8  1.9  2.1       Track   Laps  11  8 Break taken while talking with RD.  16     Minutes  10  10  10      METs  2.92  2.39  3.79       Home Exercise Plan   Plans to continue exercise at  -  Home (comment)  Home (comment)      Frequency  -  Add 3 additional days to program exercise sessions.  Add 3 additional days to program exercise sessions.     Initial Home Exercises Provided  -  12/02/17  12/02/17        Exercise Comments:  Exercise Comments    Row Name 11/23/17 1208 12/02/17 1031 12/07/17 1012  12/21/17 1014     Exercise Comments  Reviewed METs with patient.  Reviewed home exercise guidelines with patient.  Reviewed METs and goals with patient.  Reviewed METs with patient.       Exercise Goals and Review:  Exercise Goals    Row Name 11/15/17 0917             Exercise Goals   Increase Physical Activity  Yes       Intervention  Provide advice, education, support and counseling about physical activity/exercise needs.;Develop an individualized exercise prescription for aerobic and resistive training based on initial evaluation findings, risk stratification, comorbidities and participant's personal goals.       Expected Outcomes  Achievement of increased cardiorespiratory fitness and enhanced flexibility, muscular endurance and strength shown through measurements of functional capacity and personal statement of participant.       Increase Strength and Stamina  Yes increase UE strength to return to yardwork and manual labor.  Learn how to walk on treadmill       Intervention  Provide advice, education, support and counseling about physical activity/exercise needs.;Develop an individualized exercise prescription for aerobic and resistive training based on initial evaluation findings, risk stratification, comorbidities and participant's personal goals.       Expected Outcomes  Achievement of increased cardiorespiratory fitness and enhanced flexibility, muscular endurance and strength shown through measurements of functional capacity and personal statement of participant.       Able to understand and use rate of perceived exertion (RPE) scale  Yes       Intervention  Provide education and explanation on how to  use RPE scale       Expected Outcomes  Short Term: Able to use RPE daily in rehab to express subjective intensity level;Long Term:  Able to use RPE to guide intensity level when exercising independently       Knowledge and understanding of Target Heart Rate Range (THRR)  Yes       Intervention  Provide education and explanation of THRR including how the numbers were predicted and where they are located for reference       Expected Outcomes  Short Term: Able to state/look up THRR;Long Term: Able to use THRR to govern intensity when exercising independently;Short Term: Able to use daily as guideline for intensity in rehab       Able to check pulse independently  Yes       Intervention  Provide education and demonstration on how to check pulse in carotid and radial arteries.;Review the importance of being able to check your own pulse for safety during independent exercise       Expected Outcomes  Short Term: Able to explain why pulse checking is important during independent exercise;Long Term: Able to check pulse independently and accurately       Understanding of Exercise Prescription  Yes       Intervention  Provide education, explanation, and written materials on patient's individual exercise prescription       Expected Outcomes  Short Term: Able to explain program exercise prescription;Long Term: Able to explain home exercise prescription to exercise independently          Exercise Goals Re-Evaluation : Exercise Goals Re-Evaluation    Row Name 11/23/17 1208 12/02/17 1031 12/07/17 1012         Exercise Goal Re-Evaluation   Exercise Goals Review  Increase Physical Activity  Understanding of Exercise Prescription;Able to understand and use rate of perceived exertion (RPE) scale;Knowledge  and understanding of Target Heart Rate Range (THRR)  Increase Strength and Stamina;Increase Physical Activity     Comments  Off to a good start with exercise.  Reviewed home exercise guidelines with patient  including THRR, RPE scale, and endpoints for exercise.  Patient states he walking and doing stretches every other day for his home exercise. Patient wants to increase upper body strength. Encouraged patient to increase handwts from 3lbs to 4lbs next session. Also, encouraged patient to increase SPM on NuStep to 60-70 from 44.     Expected Outcomes  Increase workloads as tolerated.  Walk at least 30 minutes, 1 day/week in addition to exercise at cardiac rehab to help increase physical activity and progress towards health and fitness goals.  Increase workloads as tolerated to help achieve goal of increased strength, particularly upper body strength.         Discharge Exercise Prescription (Final Exercise Prescription Changes): Exercise Prescription Changes - 12/21/17 1100      Response to Exercise   Blood Pressure (Admit)  154/78    Blood Pressure (Exercise)  150/90    Blood Pressure (Exit)  118/84    Heart Rate (Admit)  93 bpm    Heart Rate (Exercise)  102 bpm    Heart Rate (Exit)  76 bpm    Rating of Perceived Exertion (Exercise)  12    Symptoms  none    Duration  Continue with 30 min of aerobic exercise without signs/symptoms of physical distress.    Intensity  THRR unchanged      Progression   Progression  Continue to progress workloads to maintain intensity without signs/symptoms of physical distress.    Average METs  3.1      Resistance Training   Training Prescription  Yes    Weight  3lbs    Reps  10-15    Time  10 Minutes      Interval Training   Interval Training  No      Bike   Level  1.1    Minutes  10    METs  3.38      NuStep   Level  4    SPM  60    Minutes  10    METs  2.1      Track   Laps  16    Minutes  10    METs  3.79      Home Exercise Plan   Plans to continue exercise at  Home (comment)    Frequency  Add 3 additional days to program exercise sessions.    Initial Home Exercises Provided  12/02/17       Nutrition:  Target Goals:  Understanding of nutrition guidelines, daily intake of sodium 1500mg , cholesterol 200mg , calories 30% from fat and 7% or less from saturated fats, daily to have 5 or more servings of fruits and vegetables.  Biometrics: Pre Biometrics - 11/15/17 1201      Pre Biometrics   Height  5' 6.5" (1.689 m)    Weight  185 lb 6.5 oz (84.1 kg)    Waist Circumference  41 inches    Hip Circumference  40 inches    Waist to Hip Ratio  1.02 %    BMI (Calculated)  29.48    Triceps Skinfold  15 mm    % Body Fat  28.8 %    Grip Strength  36 kg    Flexibility  7.5 in    Single Leg Stand  30 seconds        Nutrition Therapy Plan and Nutrition Goals: Nutrition Therapy & Goals - 11/15/17 1533      Nutrition Therapy   Diet  Carb Modified, Heart Healthy      Personal Nutrition Goals   Nutrition Goal  Pt to identify and limit food sources of saturated fat, trans fat, and sodium    Personal Goal #2  Pt to identify food quantities necessary to achieve weight loss of 6-10 lb  at graduation from cardiac rehab.      Intervention Plan   Intervention  Prescribe, educate and counsel regarding individualized specific dietary modifications aiming towards targeted core components such as weight, hypertension, lipid management, diabetes, heart failure and other comorbidities.    Expected Outcomes  Short Term Goal: Understand basic principles of dietary content, such as calories, fat, sodium, cholesterol and nutrients.;Long Term Goal: Adherence to prescribed nutrition plan.       Nutrition Discharge: Nutrition Scores: Nutrition Assessments - 11/15/17 1533      MEDFICTS Scores   Pre Score  72       Nutrition Goals Re-Evaluation:   Nutrition Goals Re-Evaluation:   Nutrition Goals Discharge (Final Nutrition Goals Re-Evaluation):   Psychosocial: Target Goals: Acknowledge presence or absence of significant depression and/or stress, maximize coping skills, provide positive support system. Participant is  able to verbalize types and ability to use techniques and skills needed for reducing stress and depression.  Initial Review & Psychosocial Screening: Initial Psych Review & Screening - 11/15/17 1159      Initial Review   Current issues with  None Identified      Family Dynamics   Good Support System?  Yes    Comments  no psychosocial needs identified, no interventions necessary       Barriers   Psychosocial barriers to participate in program  There are no identifiable barriers or psychosocial needs.      Screening Interventions   Interventions  Encouraged to exercise       Quality of Life Scores: Quality of Life - 11/15/17 1033      Quality of Life Scores   Health/Function Pre  26.6 %    Socioeconomic Pre  24.6 %    Psych/Spiritual Pre  27.7 %    Family Pre  28.8 %    GLOBAL Pre  26.69 %       PHQ-9: Recent Review Flowsheet Data    Depression screen [facility] 2/9 06/14/2017   Decreased Interest 0   Down, Depressed, Hopeless 0   PHQ - 2 Score 0     Interpretation of Total Score  Total Score Depression Severity:  1-4 = Minimal depression, 5-9 = Mild depression, 10-14 = Moderate depression, 15-19 = Moderately severe depression, 20-27 = Severe depression   Psychosocial Evaluation and Intervention:   Psychosocial Re-Evaluation: Psychosocial Re-Evaluation    Row Name 12/22/17 1002             Psychosocial Re-Evaluation   Current issues with  None Identified       Interventions  Encouraged to attend Cardiac Rehabilitation for the exercise       Continue Psychosocial Services   No Follow up required          Psychosocial Discharge (Final Psychosocial Re-Evaluation): Psychosocial Re-Evaluation - 12/22/17 1002      Psychosocial Re-Evaluation   Current issues with  None Identified    Interventions  Encouraged to attend Cardiac Rehabilitation for the exercise  Continue Psychosocial Services   No Follow up required       Vocational Rehabilitation: Provide  vocational rehab assistance to qualifying candidates.   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 Participant will state understanding/return demonstration of topics presented.  Learning Barriers/Preferences: Learning Barriers/Preferences - 11/15/17 1057      Learning Barriers/Preferences   Learning Barriers  Reading;Sight       Education Topics: Count Your Pulse:  -Group instruction provided by verbal instruction, demonstration, patient participation and written materials to support subject.  Instructors address importance of being able to find your pulse and how to count your pulse when at home without a heart monitor.  Patients get hands on experience counting their pulse with staff help and individually.   Heart Attack, Angina, and Risk Factor Modification:  -Group instruction provided by verbal instruction, video, and written materials to support subject.  Instructors address signs and symptoms of angina and heart attacks.    Also discuss risk factors for heart disease and how to make changes to improve heart health risk factors.   Functional Fitness:  -Group instruction provided by verbal instruction, demonstration, patient participation, and written materials to support subject.  Instructors address safety measures for doing things around the house.  Discuss how to get up and down off the floor, how to pick things up properly, how to safely get out of a chair without assistance, and balance training.   Meditation and Mindfulness:  -Group instruction provided by verbal instruction, patient participation, and written materials to support subject.  Instructor addresses  importance of mindfulness and meditation practice to help reduce stress and improve awareness.  Instructor also leads participants through a meditation exercise.    Stretching for Flexibility and Mobility:  -Group instruction provided by verbal instruction, patient participation, and written materials to support subject.  Instructors lead participants through series of stretches that are designed to increase flexibility thus improving mobility.  These stretches are additional exercise for major muscle groups that are typically performed during regular warm up and cool down.   Hands Only CPR:  -Group verbal, video, and participation provides a basic overview of AHA guidelines for community CPR. Role-play of emergencies allow participants the opportunity to practice calling for help and chest compression technique with discussion of AED use.   Hypertension: -Group verbal and written instruction that provides a basic overview of hypertension including the most recent diagnostic guidelines, risk factor reduction with self-care instructions and medication management.    Nutrition I class: Heart Healthy Eating:  -Group instruction provided by PowerPoint slides, verbal discussion, and written materials to support subject matter. The instructor gives an explanation and review of the Therapeutic Lifestyle Changes diet recommendations, which includes a discussion on lipid goals, dietary fat, sodium, fiber, plant stanol/sterol esters, sugar, and the components of a well-balanced, healthy diet.   CARDIAC REHAB PHASE II EXERCISE from 12/07/2017 in [facility] CARDIAC REHAB  Date  11/15/17  Educator  RD  Instruction Review Code  Not applicable      Nutrition II class: Lifestyle Skills:  -Group instruction provided by PowerPoint slides, verbal discussion, and written materials to support subject matter. The instructor gives an explanation and review of  label reading, grocery shopping for  heart health, heart healthy recipe modifications, and ways to make healthier choices when eating out.   CARDIAC REHAB PHASE II EXERCISE from 12/07/2017 in [facility] CARDIAC REHAB  Date  11/15/17  Educator  RD  Instruction Review Code  Not applicable      Diabetes Question & Answer:  -Group instruction provided by PowerPoint slides, verbal discussion, and written materials to support subject matter. The instructor gives an explanation and review of diabetes co-morbidities, pre- and post-prandial blood glucose goals, pre-exercise blood glucose goals, signs, symptoms, and treatment of hypoglycemia and hyperglycemia, and foot care basics.   Diabetes Blitz:  -Group instruction provided by PowerPoint slides, verbal discussion, and written materials to support subject matter. The instructor gives an explanation and review of the physiology behind type 1 and type 2 diabetes, diabetes medications and rational behind using different medications, pre- and post-prandial blood glucose recommendations and Hemoglobin A1c goals, diabetes diet, and exercise including blood glucose guidelines for exercising safely.    CARDIAC REHAB PHASE II EXERCISE from 12/07/2017 in [facility] CARDIAC REHAB  Date  11/15/17  Educator  RD  Instruction Review Code  Not applicable      Portion Distortion:  -Group instruction provided by PowerPoint slides, verbal discussion, written materials, and food models to support subject matter. The instructor gives an explanation of serving size versus portion size, changes in portions sizes over the last 20 years, and what consists of a serving from each food group.   Stress Management:  -Group instruction provided by verbal instruction, video, and written materials to support subject matter.  Instructors review role of stress in heart disease and how to cope with stress positively.     Exercising on Your Own:  -Group instruction provided by  verbal instruction, power point, and written materials to support subject.  Instructors discuss benefits of exercise, components of exercise, frequency and intensity of exercise, and end points for exercise.  Also discuss use of nitroglycerin and activating EMS.  Review options of places to exercise outside of rehab.  Review guidelines for sex with heart disease.   Cardiac Drugs I:  -Group instruction provided by verbal instruction and written materials to support subject.  Instructor reviews cardiac drug classes: antiplatelets, anticoagulants, beta blockers, and statins.  Instructor discusses reasons, side effects, and lifestyle considerations for each drug class.   Cardiac Drugs II:  -Group instruction provided by verbal instruction and written materials to support subject.  Instructor reviews cardiac drug classes: angiotensin converting enzyme inhibitors (ACE-I), angiotensin II receptor blockers (ARBs), nitrates, and calcium channel blockers.  Instructor discusses reasons, side effects, and lifestyle considerations for each drug class.   Anatomy and Physiology of the Circulatory System:  Group verbal and written instruction and models provide basic cardiac anatomy and physiology, with the coronary electrical and arterial systems. Review of: AMI, Angina, Valve disease, Heart Failure, Peripheral Artery Disease, Cardiac Arrhythmia, Pacemakers, and the ICD.   CARDIAC REHAB PHASE II EXERCISE from 12/07/2017 in [facility] CARDIAC REHAB  Date  11/23/17  Instruction Review Code  2- meets goals/outcomes      Other Education:  -Group or individual verbal, written, or video instructions that support the educational goals of the cardiac rehab program.   Knowledge Questionnaire Score: Knowledge Questionnaire Score - 11/15/17 0916      Knowledge Questionnaire Score   Pre Score  15/24       Core Components/Risk Factors/Patient Goals at Admission:  Personal Goals and Risk Factors  at Admission - 11/15/17 1122      Core Components/Risk Factors/Patient Goals on Admission    Weight Management  Yes;Weight Maintenance;Weight Loss    Intervention  Weight Management: Develop a combined nutrition and exercise program designed to reach desired caloric intake, while maintaining appropriate intake of nutrient and fiber, sodium and fats, and appropriate energy expenditure required for the weight goal.;Weight Management/Obesity: Establish reasonable short term and long term weight goals.;Weight Management: Provide education and appropriate resources to help participant work on and attain dietary goals.    Admit Weight  185 lb 6.5 oz (84.1 kg)    Goal Weight: Short Term  180 lb (81.6 kg)    Goal Weight: Long Term  175 lb (79.4 kg)    Expected Outcomes  Short Term: Continue to assess and modify interventions until short term weight is achieved;Long Term: Adherence to nutrition and physical activity/exercise program aimed toward attainment of established weight goal;Weight Maintenance: Understanding of the daily nutrition guidelines, which includes 25-35% calories from fat, 7% or less cal from saturated fats, less than 200mg  cholesterol, less than 1.5gm of sodium, & 5 or more servings of fruits and vegetables daily;Weight Loss: Understanding of general recommendations for a balanced deficit meal plan, which promotes 1-2 lb weight loss per week and includes a negative energy balance of [phone] kcal/d;Understanding recommendations for meals to include 15-35% energy as protein, 25-35% energy from fat, 35-60% energy from carbohydrates, less than 200mg  of dietary cholesterol, 20-35 gm of total fiber daily;Understanding of distribution of calorie intake throughout the day with the consumption of 4-5 meals/snacks    Diabetes  Yes    Intervention  Provide education about signs/symptoms and action to take for hypo/hyperglycemia.;Provide education about proper nutrition, including hydration, and  aerobic/resistive exercise prescription along with prescribed medications to achieve blood glucose in normal ranges: Fasting glucose 65-99 mg/dL    Expected Outcomes  Short Term: Participant verbalizes understanding of the signs/symptoms and immediate care of hyper/hypoglycemia, proper foot care and importance of medication, aerobic/resistive exercise and nutrition plan for blood glucose control.;Long Term: Attainment of HbA1C < 7%.    Hypertension  Yes    Intervention  Provide education on lifestyle modifcations including regular physical activity/exercise, weight management, moderate sodium restriction and increased consumption of fresh fruit, vegetables, and low fat dairy, alcohol moderation, and smoking cessation.;Monitor prescription use compliance.    Expected Outcomes  Short Term: Continued assessment and intervention until BP is < 140/17mm HG in hypertensive participants. < 130/62mm HG in hypertensive participants with diabetes, heart failure or chronic kidney disease.;Long Term: Maintenance of blood pressure at goal levels.    Lipids  Yes    Intervention  Provide education and support for participant on nutrition & aerobic/resistive exercise along with prescribed medications to achieve LDL 70mg , HDL >40mg .    Expected Outcomes  Short Term: Participant states understanding of desired cholesterol values and is compliant with medications prescribed. Participant is following exercise prescription and nutrition guidelines.;Long Term: Cholesterol controlled with medications as prescribed, with individualized exercise RX and with personalized nutrition plan. Value goals: LDL < 70mg , HDL > 40 mg.       Core Components/Risk Factors/Patient Goals Review:  Goals and Risk Factor Review    Row Name 12/22/17 0958 12/22/17 1003           Core Components/Risk Factors/Patient Goals Review   Personal Goals Review  Weight Management/Obesity;Lipids;Hypertension;Diabetes  Weight  Management/Obesity;Lipids;Hypertension;Diabetes      Review  Trayven has  gained weight and has had some intermittent blood pressure elevations at cardiac rehab  Juma has gained weight and has had some intermittent blood pressure elevations at cardiac rehab. Abhijot's CBG's have been stable.      Expected Outcomes  Ethel will continue to watch his dietary intake and monitor his blood pressure. Dr Damian Leavell office has been notfied about weight gain and blood pressures at cardiac rehab.  Zebulin will continue to watch his dietary intake and monitor his blood pressure. Dr Damian Leavell office has been notfied about weight gain and blood pressures at cardiac rehab.         Core Components/Risk Factors/Patient Goals at Discharge (Final Review):  Goals and Risk Factor Review - 12/22/17 1003      Core Components/Risk Factors/Patient Goals Review   Personal Goals Review  Weight Management/Obesity;Lipids;Hypertension;Diabetes    Review  Wilman has gained weight and has had some intermittent blood pressure elevations at cardiac rehab. Bartlett's CBG's have been stable.    Expected Outcomes  Amine will continue to watch his dietary intake and monitor his blood pressure. Dr Damian Leavell office has been notfied about weight gain and blood pressures at cardiac rehab.       ITP Comments: ITP Comments    Row Name 11/15/17 0902 11/25/17 1647 12/22/17 0958       ITP Comments  Dr. Armanda Magic, Medical Director   30 day ITP review. Patient with good participation and attendance at cardiac rehab.  30 day ITP review. Patient with good participation and attendance at cardiac rehab.        Comments: See 30 day ITP comments.Gladstone Lighter, RN,BSN 12/23/2017 8:58 AM

## 2017-12-23 ENCOUNTER — Encounter (HOSPITAL_COMMUNITY)
Admission: RE | Admit: 2017-12-23 | Discharge: 2017-12-23 | Disposition: A | Discharge status: Home / Self Care | Payer: Medicare HMO | Source: Ambulatory Visit | Attending: Cardiology | Admitting: Cardiology

## 2017-12-23 DIAGNOSIS — Z48812 Encounter for surgical aftercare following surgery on the circulatory system: Secondary | ICD-10-CM | POA: Diagnosis not present

## 2017-12-23 DIAGNOSIS — Z952 Presence of prosthetic heart valve: Secondary | ICD-10-CM

## 2017-12-23 LAB — GLUCOSE, CAPILLARY: GLUCOSE-CAPILLARY: 131 mg/dL — AB (ref 65–99)

## 2017-12-26 ENCOUNTER — Encounter (HOSPITAL_COMMUNITY)
Admission: RE | Admit: 2017-12-26 | Discharge: 2017-12-26 | Disposition: A | Discharge status: Home / Self Care | Payer: Medicare HMO | Source: Ambulatory Visit | Attending: Cardiology | Admitting: Cardiology

## 2017-12-26 DIAGNOSIS — Z952 Presence of prosthetic heart valve: Secondary | ICD-10-CM

## 2017-12-26 DIAGNOSIS — Z48812 Encounter for surgical aftercare following surgery on the circulatory system: Secondary | ICD-10-CM | POA: Diagnosis not present

## 2017-12-26 LAB — GLUCOSE, CAPILLARY: Glucose-Capillary: 144 mg/dL — ABNORMAL HIGH (ref 65–99)

## 2017-12-28 ENCOUNTER — Encounter (HOSPITAL_COMMUNITY)
Admission: RE | Admit: 2017-12-28 | Discharge: 2017-12-28 | Disposition: A | Discharge status: Home / Self Care | Payer: Medicare HMO | Source: Ambulatory Visit | Attending: Cardiology | Admitting: Cardiology

## 2017-12-28 DIAGNOSIS — Z952 Presence of prosthetic heart valve: Secondary | ICD-10-CM | POA: Diagnosis present

## 2017-12-28 DIAGNOSIS — Z48812 Encounter for surgical aftercare following surgery on the circulatory system: Secondary | ICD-10-CM | POA: Diagnosis not present

## 2017-12-28 LAB — GLUCOSE, CAPILLARY: GLUCOSE-CAPILLARY: 143 mg/dL — AB (ref 65–99)

## 2017-12-30 ENCOUNTER — Encounter (HOSPITAL_COMMUNITY)
Admission: RE | Admit: 2017-12-30 | Discharge: 2017-12-30 | Disposition: A | Discharge status: Home / Self Care | Payer: Medicare HMO | Source: Ambulatory Visit | Attending: Cardiology | Admitting: Cardiology

## 2017-12-30 DIAGNOSIS — Z952 Presence of prosthetic heart valve: Secondary | ICD-10-CM

## 2017-12-30 DIAGNOSIS — Z48812 Encounter for surgical aftercare following surgery on the circulatory system: Secondary | ICD-10-CM | POA: Diagnosis not present

## 2017-12-30 LAB — GLUCOSE, CAPILLARY: Glucose-Capillary: 108 mg/dL — ABNORMAL HIGH (ref 65–99)

## 2018-01-02 ENCOUNTER — Encounter (HOSPITAL_COMMUNITY)
Admission: RE | Admit: 2018-01-02 | Discharge: 2018-01-02 | Disposition: A | Discharge status: Home / Self Care | Payer: Medicare HMO | Source: Ambulatory Visit | Attending: Cardiology | Admitting: Cardiology

## 2018-01-02 DIAGNOSIS — Z48812 Encounter for surgical aftercare following surgery on the circulatory system: Secondary | ICD-10-CM | POA: Diagnosis not present

## 2018-01-02 DIAGNOSIS — Z952 Presence of prosthetic heart valve: Secondary | ICD-10-CM

## 2018-01-04 ENCOUNTER — Encounter (HOSPITAL_COMMUNITY)
Admission: RE | Admit: 2018-01-04 | Discharge: 2018-01-04 | Disposition: A | Discharge status: Home / Self Care | Payer: Medicare HMO | Source: Ambulatory Visit | Attending: Cardiology | Admitting: Cardiology

## 2018-01-04 DIAGNOSIS — Z952 Presence of prosthetic heart valve: Secondary | ICD-10-CM

## 2018-01-04 DIAGNOSIS — Z48812 Encounter for surgical aftercare following surgery on the circulatory system: Secondary | ICD-10-CM | POA: Diagnosis not present

## 2018-01-06 ENCOUNTER — Encounter (HOSPITAL_COMMUNITY)
Admission: RE | Admit: 2018-01-06 | Discharge: 2018-01-06 | Disposition: A | Discharge status: Home / Self Care | Payer: Medicare HMO | Source: Ambulatory Visit | Attending: Cardiology | Admitting: Cardiology

## 2018-01-06 DIAGNOSIS — Z48812 Encounter for surgical aftercare following surgery on the circulatory system: Secondary | ICD-10-CM | POA: Diagnosis not present

## 2018-01-06 DIAGNOSIS — Z952 Presence of prosthetic heart valve: Secondary | ICD-10-CM

## 2018-01-09 ENCOUNTER — Encounter (HOSPITAL_COMMUNITY)
Admission: RE | Admit: 2018-01-09 | Discharge: 2018-01-09 | Disposition: A | Discharge status: Home / Self Care | Payer: Medicare HMO | Source: Ambulatory Visit | Attending: Cardiology | Admitting: Cardiology

## 2018-01-09 DIAGNOSIS — Z48812 Encounter for surgical aftercare following surgery on the circulatory system: Secondary | ICD-10-CM | POA: Diagnosis not present

## 2018-01-09 DIAGNOSIS — Z952 Presence of prosthetic heart valve: Secondary | ICD-10-CM

## 2018-01-11 ENCOUNTER — Encounter (HOSPITAL_COMMUNITY): Payer: Medicare HMO

## 2018-01-13 ENCOUNTER — Encounter (HOSPITAL_COMMUNITY)
Admission: RE | Admit: 2018-01-13 | Discharge: 2018-01-13 | Disposition: A | Discharge status: Home / Self Care | Payer: Medicare HMO | Source: Ambulatory Visit | Attending: Cardiology | Admitting: Cardiology

## 2018-01-13 DIAGNOSIS — Z48812 Encounter for surgical aftercare following surgery on the circulatory system: Secondary | ICD-10-CM | POA: Diagnosis not present

## 2018-01-13 DIAGNOSIS — Z952 Presence of prosthetic heart valve: Secondary | ICD-10-CM

## 2018-01-16 ENCOUNTER — Encounter (HOSPITAL_COMMUNITY)
Admission: RE | Admit: 2018-01-16 | Discharge: 2018-01-16 | Disposition: A | Discharge status: Home / Self Care | Payer: Medicare HMO | Source: Ambulatory Visit | Attending: Cardiology | Admitting: Cardiology

## 2018-01-16 DIAGNOSIS — Z48812 Encounter for surgical aftercare following surgery on the circulatory system: Secondary | ICD-10-CM | POA: Diagnosis not present

## 2018-01-16 DIAGNOSIS — Z952 Presence of prosthetic heart valve: Secondary | ICD-10-CM

## 2018-01-17 ENCOUNTER — Encounter: Payer: Self-pay | Admitting: Internal Medicine

## 2018-01-17 ENCOUNTER — Ambulatory Visit (INDEPENDENT_AMBULATORY_CARE_PROVIDER_SITE_OTHER): Payer: Medicare HMO | Admitting: Internal Medicine

## 2018-01-17 ENCOUNTER — Encounter (INDEPENDENT_AMBULATORY_CARE_PROVIDER_SITE_OTHER): Payer: Self-pay

## 2018-01-17 VITALS — BP 140/80 | HR 100 | Resp 16 | Ht 69.0 in | Wt 197.0 lb

## 2018-01-17 DIAGNOSIS — Z950 Presence of cardiac pacemaker: Secondary | ICD-10-CM

## 2018-01-17 DIAGNOSIS — I442 Atrioventricular block, complete: Secondary | ICD-10-CM | POA: Diagnosis not present

## 2018-01-17 NOTE — Progress Notes (Signed)
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  acetaminophen (TYLENOL) 500 MG tablet, Take 1,000 mg by mouth daily as needed for moderate pain., Disp: , Rfl:  .  aspirin 325 MG EC tablet, Take 1 tablet (325 mg total) by mouth daily., Disp: 30 tablet, Rfl: 0 .  losartan (COZAAR) 100 MG tablet, Take 100 mg by mouth daily., Disp: , Rfl:  .  lovastatin (MEVACOR) 40 MG tablet, Take 1 tablet (40 mg total) by mouth at bedtime., Disp: 90 tablet, Rfl: 3 .  metFORMIN (GLUCOPHAGE) 500 MG tablet, TAKE ONE TABLET BY MOUTH TWICE DAILY WITH  MEAL, Disp: 180 tablet, Rfl: 3 .  metoprolol tartrate (LOPRESSOR) 50 MG tablet, Take 1 tablet (50 mg total) by mouth 2 (two) times daily., Disp: 60 tablet, Rfl: 1 .  Multiple Vitamins-Minerals (MULTIVITAMIN WITH MINERALS) tablet, Take 1 tablet by mouth daily., Disp: , Rfl:  .  omeprazole (PRILOSEC) 40 MG capsule, Take 1 capsule (40 mg total) by mouth daily., Disp: 90 capsule, Rfl: 3 .  oxyCODONE (OXY IR/ROXICODONE) 5 MG immediate release tablet, Take 1 tablet (5 mg total) every 6 (six) hours as needed by mouth for severe pain. (Patient taking differently: Take 5 mg by mouth every 6 (six) hours as needed for severe pain (taking as needed). ), Disp: 30 tablet, Rfl: 0 .  oxymetazoline (AFRIN) 0.05 % nasal spray, Place 1 spray into both nostrils daily as needed for congestion., Disp: , Rfl:  .  Tetrahydrozoline HCl (VISINE OP), Apply  1 drop to eye daily as needed (dry eyes)., Disp: , Rfl:   Past Medical History: Past Medical History:  Diagnosis Date  . Aortic stenosis    Dr. Einar Gip  . Arthritis   . Coronary artery disease 07/12/2017   Non occlusive CAD from cardiac catherization   . Diabetes mellitus without complication ([place]) 2694   type 2   . Former smoker   . GERD (gastroesophageal reflux disease)   . Hyperlipidemia   . Hypertension 2012  . PVD (peripheral vascular disease) ([place])   . 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 Last attempt to quit: 09/28/2007  . Years since quitting: 10.3  Smokeless Tobacco Never Used    Labs: Recent Review Flowsheet Data    Labs for ITP Cardiac and Pulmonary Rehab Latest Ref Rng & Units 09/29/2017 09/29/2017 09/29/2017 10/01/2017 10/02/2017   Cholestrol <200 mg/dL - - - - -   LDLCALC <100 mg/dL - - - - -   HDL >40 mg/dL - - - - -   Trlycerides <150 mg/dL - - - - -   Hemoglobin A1c 4.8 - 5.6 % - - - - -   PHART 7.350 - 7.450 7.408 - 7.345(L) - -   PCO2ART 32.0 - 48.0 mmHg 39.3 - 36.2 - -  HCO3 20.0 - 28.0 mmol/L 24.8 - 19.6(L) - -   TCO2 22 - 32 mmol/L 26 23 21(L) 23 23   ACIDBASEDEF 0.0 - 2.0 mmol/L - - 5.0(H) - -   O2SAT % 97.0 - 96.0 - -      Capillary Blood Glucose: Lab Results  Component Value Date   GLUCAP 108 (H) 12/30/2017   GLUCAP 143 (H) 12/28/2017   GLUCAP 144 (H) 12/26/2017   GLUCAP 131 (H) 12/23/2017   GLUCAP 227 (H) 12/16/2017     Exercise Target Goals:    Exercise Program Goal: Individual exercise prescription set using results from initial 6 min walk test and THRR while considering  patient's activity barriers and safety.   Exercise Prescription Goal: Initial exercise prescription builds to 30-45 minutes a day of aerobic activity, 2-3 days per week.  Home exercise guidelines will be given to patient during program as part of exercise prescription  that the participant will acknowledge.  Activity Barriers & Risk Stratification: Activity Barriers & Cardiac Risk Stratification - 11/15/17 0913      Activity Barriers & Cardiac Risk Stratification   Activity Barriers  Back Problems;Deconditioning;Muscular Weakness    Cardiac Risk Stratification  High       6 Minute Walk: 6 Minute Walk    Row Name 11/15/17 1117         6 Minute Walk   Phase  Initial     Distance  1440 feet     Walk Time  6 minutes     # of Rest Breaks  0     MPH  2.73     METS  3.33     RPE  13     VO2 Peak  11.66     Symptoms  Yes (comment)     Comments  B leg pain     Resting HR  74 bpm     Resting BP  128/80     Resting Oxygen Saturation   98 %     Exercise Oxygen Saturation  during 6 min walk  9 %     Max Ex. HR  97 bpm     Max Ex. BP  140/80     2 Minute Post BP  118/78        Oxygen Initial Assessment:   Oxygen Re-Evaluation:   Oxygen Discharge (Final Oxygen Re-Evaluation):   Initial Exercise Prescription: Initial Exercise Prescription - 11/15/17 1100      Date of Initial Exercise RX and Referring Provider   Date  11/15/17    Referring Provider  Vernell Leep MD      Bike   Level  0.7    Minutes  10    METs  2.55      NuStep   Level  30    SPM  80    Minutes  10    METs  2.5      Track   Laps  10    Minutes  10    METs  2.74      Prescription Details   Frequency (times per week)  3    Duration  Progress to 30 minutes of continuous aerobic without signs/symptoms of physical distress      Intensity   THRR 40-80% of Max Heartrate  62-124    Ratings of Perceived Exertion  11-13    Perceived Dyspnea  0-4      Progression   Progression  Continue to progress workloads to maintain  intensity without signs/symptoms of physical distress.      Resistance Training   Training Prescription  Yes    Weight  3lbs    Reps  10-15       Perform Capillary Blood Glucose checks as needed.  Exercise Prescription  Changes: Exercise Prescription Changes    Row Name 11/23/17 0948 12/07/17 1100 12/21/17 1100 01/09/18 0948       Response to Exercise   Blood Pressure (Admit)  124/80  138/88  154/78  144/90    Blood Pressure (Exercise)  128/80  132/78  150/90  138/80    Blood Pressure (Exit)  122/64  132/90  118/84  120/80    Heart Rate (Admit)  77 bpm  87 bpm  93 bpm  75 bpm    Heart Rate (Exercise)  90 bpm  105 bpm  102 bpm  103 bpm    Heart Rate (Exit)  68 bpm  85 bpm  76 bpm  77 bpm    Rating of Perceived Exertion (Exercise)  13  11  12  12     Symptoms  none  none  none  none    Duration  Progress to 30 minutes of  aerobic without signs/symptoms of physical distress  Progress to 30 minutes of  aerobic without signs/symptoms of physical distress  Continue with 30 min of aerobic exercise without signs/symptoms of physical distress.  Continue with 30 min of aerobic exercise without signs/symptoms of physical distress.    Intensity  THRR unchanged  THRR unchanged  THRR unchanged  THRR unchanged      Progression   Progression  Continue to progress workloads to maintain intensity without signs/symptoms of physical distress.  Continue to progress workloads to maintain intensity without signs/symptoms of physical distress.  Continue to progress workloads to maintain intensity without signs/symptoms of physical distress.  Continue to progress workloads to maintain intensity without signs/symptoms of physical distress.    Average METs  2.4  2.4  3.1  3.1      Resistance Training   Training Prescription  No Relaxation today  Yes  Yes  Yes    Weight  -  3lbs  3lbs  4lbs    Reps  -  10-15  10-15  10-15    Time  -  10 Minutes  10 Minutes  10 Minutes      Interval Training   Interval Training  No  No  No  No      Bike   Level  0.7  0.8  1.1  1.1    Minutes  10  10  10  10     METs  2.56  2.76  3.38  3.37      NuStep   Level  3  4  4  4     SPM  80  60  60  60    Minutes  10  10  10  10     METs  1.8  1.9   2.1  2      Track   Laps  11  8 Break taken while talking with RD.  16  16    Minutes  10  10  10  10     METs  2.92  2.39  3.79  3.79      Home Exercise Plan   Plans to continue exercise at  -  Home (comment)  Home (comment)  Home (comment)    Frequency  -  Add 3 additional  days to program exercise sessions.  Add 3 additional days to program exercise sessions.  Add 3 additional days to program exercise sessions.    Initial Home Exercises Provided  -  12/02/17  12/02/17  12/02/17       Exercise Comments: Exercise Comments    Row Name 11/23/17 1208 12/02/17 1031 12/07/17 1012 12/21/17 1014 01/09/18 0948   Exercise Comments  Reviewed METs with patient.  Reviewed home exercise guidelines with patient.  Reviewed METs and goals with patient.  Reviewed METs with patient.  Reviewed METs and goals with patient.      Exercise Goals and Review: Exercise Goals    Row Name 11/15/17 0917             Exercise Goals   Increase Physical Activity  Yes       Intervention  Provide advice, education, support and counseling about physical activity/exercise needs.;Develop an individualized exercise prescription for aerobic and resistive training based on initial evaluation findings, risk stratification, comorbidities and participant's personal goals.       Expected Outcomes  Achievement of increased cardiorespiratory fitness and enhanced flexibility, muscular endurance and strength shown through measurements of functional capacity and personal statement of participant.       Increase Strength and Stamina  Yes increase UE strength to return to yardwork and manual labor.  Learn how to walk on treadmill       Intervention  Provide advice, education, support and counseling about physical activity/exercise needs.;Develop an individualized exercise prescription for aerobic and resistive training based on initial evaluation findings, risk stratification, comorbidities and participant's personal goals.        Expected Outcomes  Achievement of increased cardiorespiratory fitness and enhanced flexibility, muscular endurance and strength shown through measurements of functional capacity and personal statement of participant.       Able to understand and use rate of perceived exertion (RPE) scale  Yes       Intervention  Provide education and explanation on how to use RPE scale       Expected Outcomes  Short Term: Able to use RPE daily in rehab to express subjective intensity level;Long Term:  Able to use RPE to guide intensity level when exercising independently       Knowledge and understanding of Target Heart Rate Range (THRR)  Yes       Intervention  Provide education and explanation of THRR including how the numbers were predicted and where they are located for reference       Expected Outcomes  Short Term: Able to state/look up THRR;Long Term: Able to use THRR to govern intensity when exercising independently;Short Term: Able to use daily as guideline for intensity in rehab       Able to check pulse independently  Yes       Intervention  Provide education and demonstration on how to check pulse in carotid and radial arteries.;Review the importance of being able to check your own pulse for safety during independent exercise       Expected Outcomes  Short Term: Able to explain why pulse checking is important during independent exercise;Long Term: Able to check pulse independently and accurately       Understanding of Exercise Prescription  Yes       Intervention  Provide education, explanation, and written materials on patient's individual exercise prescription       Expected Outcomes  Short Term: Able to explain program exercise prescription;Long Term: Able to explain home exercise prescription to  exercise independently          Exercise Goals Re-Evaluation : Exercise Goals Re-Evaluation    Row Name 11/23/17 1208 12/02/17 1031 12/07/17 1012 01/09/18 0948       Exercise Goal Re-Evaluation   Exercise  Goals Review  Increase Physical Activity  Understanding of Exercise Prescription;Able to understand and use rate of perceived exertion (RPE) scale;Knowledge and understanding of Target Heart Rate Range (THRR)  Increase Strength and Stamina;Increase Physical Activity  Increase Strength and Stamina    Comments  Off to a good start with exercise.  Reviewed home exercise guidelines with patient including THRR, RPE scale, and endpoints for exercise.  Patient states he walking and doing stretches every other day for his home exercise. Patient wants to increase upper body strength. Encouraged patient to increase handwts from 3lbs to 4lbs next session. Also, encouraged patient to increase SPM on NuStep to 60-70 from 44.  Patient states that his strength and stamina has imnproved "100%", and that he's "exactly where he wants to be".    Expected Outcomes  Increase workloads as tolerated.  Walk at least 30 minutes, 1 day/week in addition to exercise at cardiac rehab to help increase physical activity and progress towards health and fitness goals.  Increase workloads as tolerated to help achieve goal of increased strength, particularly upper body strength.  Increased hand weights from 3 to 4lbs to help achieve goal to increase upper strength. Increase pace on stepper to increase MET level.        Discharge Exercise Prescription (Final Exercise Prescription Changes): Exercise Prescription Changes - 01/09/18 0948      Response to Exercise   Blood Pressure (Admit)  144/90    Blood Pressure (Exercise)  138/80    Blood Pressure (Exit)  120/80    Heart Rate (Admit)  75 bpm    Heart Rate (Exercise)  103 bpm    Heart Rate (Exit)  77 bpm    Rating of Perceived Exertion (Exercise)  12    Symptoms  none    Duration  Continue with 30 min of aerobic exercise without signs/symptoms of physical distress.    Intensity  THRR unchanged      Progression   Progression  Continue to progress workloads to maintain intensity  without signs/symptoms of physical distress.    Average METs  3.1      Resistance Training   Training Prescription  Yes    Weight  4lbs    Reps  10-15    Time  10 Minutes      Interval Training   Interval Training  No      Bike   Level  1.1    Minutes  10    METs  3.37      NuStep   Level  4    SPM  60    Minutes  10    METs  2      Track   Laps  16    Minutes  10    METs  3.79      Home Exercise Plan   Plans to continue exercise at  Home (comment)    Frequency  Add 3 additional days to program exercise sessions.    Initial Home Exercises Provided  12/02/17       Nutrition:  Target Goals: Understanding of nutrition guidelines, daily intake of sodium <1534m, cholesterol <2092m calories 30% from fat and 7% or less from saturated fats, daily to have 5 or more  servings of fruits and vegetables.  Biometrics: Pre Biometrics - 11/15/17 1201      Pre Biometrics   Height  5' 6.5" (1.689 m)    Weight  185 lb 6.5 oz (84.1 kg)    Waist Circumference  41 inches    Hip Circumference  40 inches    Waist to Hip Ratio  1.02 %    BMI (Calculated)  29.48    Triceps Skinfold  15 mm    % Body Fat  28.8 %    Grip Strength  36 kg    Flexibility  7.5 in    Single Leg Stand  30 seconds        Nutrition Therapy Plan and Nutrition Goals: Nutrition Therapy & Goals - 11/15/17 1533      Nutrition Therapy   Diet  Carb Modified, Heart Healthy      Personal Nutrition Goals   Nutrition Goal  Pt to identify and limit food sources of saturated fat, trans fat, and sodium    Personal Goal #2  Pt to identify food quantities necessary to achieve weight loss of 6-10 lb  at graduation from cardiac rehab.      Intervention Plan   Intervention  Prescribe, educate and counsel regarding individualized specific dietary modifications aiming towards targeted core components such as weight, hypertension, lipid management, diabetes, heart failure and other comorbidities.    Expected Outcomes   Short Term Goal: Understand basic principles of dietary content, such as calories, fat, sodium, cholesterol and nutrients.;Long Term Goal: Adherence to prescribed nutrition plan.       Nutrition Assessments: Nutrition Assessments - 11/15/17 1533      MEDFICTS Scores   Pre Score  72       Nutrition Goals Re-Evaluation:   Nutrition Goals Re-Evaluation:   Nutrition Goals Discharge (Final Nutrition Goals Re-Evaluation):   Psychosocial: Target Goals: Acknowledge presence or absence of significant depression and/or stress, maximize coping skills, provide positive support system. Participant is able to verbalize types and ability to use techniques and skills needed for reducing stress and depression.  Initial Review & Psychosocial Screening: Initial Psych Review & Screening - 11/15/17 1159      Initial Review   Current issues with  None Identified      Family Dynamics   Good Support System?  Yes    Comments  no psychosocial needs identified, no interventions necessary       Barriers   Psychosocial barriers to participate in program  There are no identifiable barriers or psychosocial needs.      Screening Interventions   Interventions  Encouraged to exercise       Quality of Life Scores: Quality of Life - 11/15/17 1033      Quality of Life Scores   Health/Function Pre  26.6 %    Socioeconomic Pre  24.6 %    Psych/Spiritual Pre  27.7 %    Family Pre  28.8 %    GLOBAL Pre  26.69 %      Scores of 19 and below usually indicate a poorer quality of life in these areas.  A difference of  2-3 points is a clinically meaningful difference.  A difference of 2-3 points in the total score of the Quality of Life Index has been associated with significant improvement in overall quality of life, self-image, physical symptoms, and general health in studies assessing change in quality of life.  PHQ-9: Recent Review Flowsheet Data    Depression screen [facility] 2/9  06/14/2017   Decreased  Interest 0   Down, Depressed, Hopeless 0   PHQ - 2 Score 0     Interpretation of Total Score  Total Score Depression Severity:  1-4 = Minimal depression, 5-9 = Mild depression, 10-14 = Moderate depression, 15-19 = Moderately severe depression, 20-27 = Severe depression   Psychosocial Evaluation and Intervention:   Psychosocial Re-Evaluation: Psychosocial Re-Evaluation    [place] Name 12/22/17 1002 01/17/18 1405           Psychosocial Re-Evaluation   Current issues with  None Identified  None Identified      Interventions  Encouraged to attend Cardiac Rehabilitation for the exercise  Encouraged to attend Cardiac Rehabilitation for the exercise      Continue Psychosocial Services   No Follow up required  No Follow up required         Psychosocial Discharge (Final Psychosocial Re-Evaluation): Psychosocial Re-Evaluation - 01/17/18 1405      Psychosocial Re-Evaluation   Current issues with  None Identified    Interventions  Encouraged to attend Cardiac Rehabilitation for the exercise    Continue Psychosocial Services   No Follow up required       Vocational Rehabilitation: Provide vocational rehab assistance to qualifying candidates.   Vocational Rehab Evaluation & Intervention: Vocational Rehab - 11/15/17 0916      Initial Vocational Rehab Evaluation & Intervention   Assessment shows need for Vocational Rehabilitation  No retired [place]       Education: Education Goals: Education classes will be provided on a weekly basis, covering required topics. Participant will state understanding/return demonstration of topics presented.  Learning Barriers/Preferences: Learning Barriers/Preferences - 11/15/17 1057      Learning Barriers/Preferences   Learning Barriers  Reading;Sight       Education Topics: Count Your Pulse:  -Group instruction provided by verbal instruction, demonstration, patient participation and written materials to support  subject.  Instructors address importance of being able to find your pulse and how to count your pulse when at home without a heart monitor.  Patients get hands on experience counting their pulse with staff help and individually.   Heart Attack, Angina, and Risk Factor Modification:  -Group instruction provided by verbal instruction, video, and written materials to support subject.  Instructors address signs and symptoms of angina and heart attacks.    Also discuss risk factors for heart disease and how to make changes to improve heart health risk factors.   Functional Fitness:  -Group instruction provided by verbal instruction, demonstration, patient participation, and written materials to support subject.  Instructors address safety measures for doing things around the house.  Discuss how to get up and down off the floor, how to pick things up properly, how to safely get out of a chair without assistance, and balance training.   Meditation and Mindfulness:  -Group instruction provided by verbal instruction, patient participation, and written materials to support subject.  Instructor addresses importance of mindfulness and meditation practice to help reduce stress and improve awareness.  Instructor also leads participants through a meditation exercise.    Stretching for Flexibility and Mobility:  -Group instruction provided by verbal instruction, patient participation, and written materials to support subject.  Instructors lead participants through series of stretches that are designed to increase flexibility thus improving mobility.  These stretches are additional exercise for major muscle groups that are typically performed during regular warm up and cool down.   Hands Only CPR:  -Group verbal, video,  and participation provides a basic overview of AHA guidelines for community CPR. Role-play of emergencies allow participants the opportunity to practice calling for help and chest compression  technique with discussion of AED use.   Hypertension: -Group verbal and written instruction that provides a basic overview of hypertension including the most recent diagnostic guidelines, risk factor reduction with self-care instructions and medication management.    Nutrition I class: Heart Healthy Eating:  -Group instruction provided by PowerPoint slides, verbal discussion, and written materials to support subject matter. The instructor gives an explanation and review of the Therapeutic Lifestyle Changes diet recommendations, which includes a discussion on lipid goals, dietary fat, sodium, fiber, plant stanol/sterol esters, sugar, and the components of a well-balanced, healthy diet.   CARDIAC REHAB PHASE II EXERCISE from 12/07/2017 in [place]  Date  11/15/17  Educator  RD  Instruction Review Code  Not applicable      Nutrition II class: Lifestyle Skills:  -Group instruction provided by PowerPoint slides, verbal discussion, and written materials to support subject matter. The instructor gives an explanation and review of label reading, grocery shopping for heart health, heart healthy recipe modifications, and ways to make healthier choices when eating out.   CARDIAC REHAB PHASE II EXERCISE from 12/07/2017 in [place]  Date  11/15/17  Educator  RD  Instruction Review Code  Not applicable      Diabetes Question & Answer:  -Group instruction provided by PowerPoint slides, verbal discussion, and written materials to support subject matter. The instructor gives an explanation and review of diabetes co-morbidities, pre- and post-prandial blood glucose goals, pre-exercise blood glucose goals, signs, symptoms, and treatment of hypoglycemia and hyperglycemia, and foot care basics.   Diabetes Blitz:  -Group instruction provided by PowerPoint slides, verbal discussion, and written materials to support subject matter. The  instructor gives an explanation and review of the physiology behind type 1 and type 2 diabetes, diabetes medications and rational behind using different medications, pre- and post-prandial blood glucose recommendations and Hemoglobin A1c goals, diabetes diet, and exercise including blood glucose guidelines for exercising safely.    CARDIAC REHAB PHASE II EXERCISE from 12/07/2017 in [place]  Date  11/15/17  Educator  RD  Instruction Review Code  Not applicable      Portion Distortion:  -Group instruction provided by PowerPoint slides, verbal discussion, written materials, and food models to support subject matter. The instructor gives an explanation of serving size versus portion size, changes in portions sizes over the last 20 years, and what consists of a serving from each food group.   Stress Management:  -Group instruction provided by verbal instruction, video, and written materials to support subject matter.  Instructors review role of stress in heart disease and how to cope with stress positively.     Exercising on Your Own:  -Group instruction provided by verbal instruction, power point, and written materials to support subject.  Instructors discuss benefits of exercise, components of exercise, frequency and intensity of exercise, and end points for exercise.  Also discuss use of nitroglycerin and activating EMS.  Review options of places to exercise outside of rehab.  Review guidelines for sex with heart disease.   Cardiac Drugs I:  -Group instruction provided by verbal instruction and written materials to support subject.  Instructor reviews cardiac drug classes: antiplatelets, anticoagulants, beta blockers, and statins.  Instructor discusses reasons, side effects, and lifestyle considerations for each drug class.  Cardiac Drugs II:  -Group instruction provided by verbal instruction and written materials to support subject.  Instructor reviews  cardiac drug classes: angiotensin converting enzyme inhibitors (ACE-I), angiotensin II receptor blockers (ARBs), nitrates, and calcium channel blockers.  Instructor discusses reasons, side effects, and lifestyle considerations for each drug class.   Anatomy and Physiology of the Circulatory System:  Group verbal and written instruction and models provide basic cardiac anatomy and physiology, with the coronary electrical and arterial systems. Review of: AMI, Angina, Valve disease, Heart Failure, Peripheral Artery Disease, Cardiac Arrhythmia, Pacemakers, and the ICD.   CARDIAC REHAB PHASE II EXERCISE from 12/07/2017 in [place]  Date  11/23/17  Instruction Review Code  2- meets goals/outcomes      Other Education:  -Group or individual verbal, written, or video instructions that support the educational goals of the cardiac rehab program.   Knowledge Questionnaire Score: Knowledge Questionnaire Score - 11/15/17 0916      Knowledge Questionnaire Score   Pre Score  15/24       Core Components/Risk Factors/Patient Goals at Admission: Personal Goals and Risk Factors at Admission - 11/15/17 1122      Core Components/Risk Factors/Patient Goals on Admission    Weight Management  Yes;Weight Maintenance;Weight Loss    Intervention  Weight Management: Develop a combined nutrition and exercise program designed to reach desired caloric intake, while maintaining appropriate intake of nutrient and fiber, sodium and fats, and appropriate energy expenditure required for the weight goal.;Weight Management/Obesity: Establish reasonable short term and long term weight goals.;Weight Management: Provide education and appropriate resources to help participant work on and attain dietary goals.    Admit Weight  185 lb 6.5 oz (84.1 kg)    Goal Weight: Short Term  180 lb (81.6 kg)    Goal Weight: Long Term  175 lb (79.4 kg)    Expected Outcomes  Short Term: Continue to assess and  modify interventions until short term weight is achieved;Long Term: Adherence to nutrition and physical activity/exercise program aimed toward attainment of established weight goal;Weight Maintenance: Understanding of the daily nutrition guidelines, which includes 25-35% calories from fat, 7% or less cal from saturated fats, less than 229m cholesterol, less than 1.5gm of sodium, & 5 or more servings of fruits and vegetables daily;Weight Loss: Understanding of general recommendations for a balanced deficit meal plan, which promotes 1-2 lb weight loss per week and includes a negative energy balance of [id number] kcal/d;Understanding recommendations for meals to include 15-35% energy as protein, 25-35% energy from fat, 35-60% energy from carbohydrates, less than 2083mof dietary cholesterol, 20-35 gm of total fiber daily;Understanding of distribution of calorie intake throughout the day with the consumption of 4-5 meals/snacks    Diabetes  Yes    Intervention  Provide education about signs/symptoms and action to take for hypo/hyperglycemia.;Provide education about proper nutrition, including hydration, and aerobic/resistive exercise prescription along with prescribed medications to achieve blood glucose in normal ranges: Fasting glucose 65-99 mg/dL    Expected Outcomes  Short Term: Participant verbalizes understanding of the signs/symptoms and immediate care of hyper/hypoglycemia, proper foot care and importance of medication, aerobic/resistive exercise and nutrition plan for blood glucose control.;Long Term: Attainment of HbA1C < 7%.    Hypertension  Yes    Intervention  Provide education on lifestyle modifcations including regular physical activity/exercise, weight management, moderate sodium restriction and increased consumption of fresh fruit, vegetables, and low fat dairy, alcohol moderation, and smoking cessation.;Monitor prescription use compliance.  Expected Outcomes  Short Term: Continued assessment and  intervention until BP is < 140/47m HG in hypertensive participants. < 130/859mHG in hypertensive participants with diabetes, heart failure or chronic kidney disease.;Long Term: Maintenance of blood pressure at goal levels.    Lipids  Yes    Intervention  Provide education and support for participant on nutrition & aerobic/resistive exercise along with prescribed medications to achieve LDL <7057mHDL >51m46m  Expected Outcomes  Short Term: Participant states understanding of desired cholesterol values and is compliant with medications prescribed. Participant is following exercise prescription and nutrition guidelines.;Long Term: Cholesterol controlled with medications as prescribed, with individualized exercise RX and with personalized nutrition plan. Value goals: LDL < 70mg68mL > 40 mg.       Core Components/Risk Factors/Patient Goals Review:  Goals and Risk Factor Review    Row Name 12/22/17 0958 12/22/17 1003 01/17/18 1405         Core Components/Risk Factors/Patient Goals Review   Personal Goals Review  Weight Management/Obesity;Lipids;Hypertension;Diabetes  Weight Management/Obesity;Lipids;Hypertension;Diabetes  Weight Management/Obesity;Lipids;Hypertension;Diabetes     Review  RichaPhenixgained weight and has had some intermittent blood pressure elevations at cardiac rehab  RichaNickolisgained weight and has had some intermittent blood pressure elevations at cardiac rehab. Samba's CBG's have been stable.  Timothey has gained weight and has had some intermittent blood pressure elevations at cardiac rehab. Erek's CBG's have been stable.     Expected Outcomes  RichaOdel continue to watch his dietary intake and monitor his blood pressure. Dr PatwaBonney Rousselce has been notfied about weight gain and blood pressures at cardiac rehab.  Jacorion will continue to watch his dietary intake and monitor his blood pressure. Dr PatwaBonney Rousselce has been notfied about weight gain and blood  pressures at cardiac rehab.  Luisalberto will continue to watch his dietary intake and monitor his blood pressure. Dr PatwaBonney Rousselce has been notfied about weight gain and blood pressures at cardiac rehab.        Core Components/Risk Factors/Patient Goals at Discharge (Final Review):  Goals and Risk Factor Review - 01/17/18 1405      Core Components/Risk Factors/Patient Goals Review   Personal Goals Review  Weight Management/Obesity;Lipids;Hypertension;Diabetes    Review  RichaSebasthiangained weight and has had some intermittent blood pressure elevations at cardiac rehab. Hilliard's CBG's have been stable.    Expected Outcomes  RichaTyrice continue to watch his dietary intake and monitor his blood pressure. Dr PatwaBonney Rousselce has been notfied about weight gain and blood pressures at cardiac rehab.       ITP Comments: ITP Comments    Row Name 11/15/17 0902 11/25/17 1647 12/22/17 0958 01/17/18 1405     ITP Comments  Dr. TraciFransico Himical Director   30 day ITP review. Patient with good participation and attendance at cardiac rehab.  30 day ITP review. Patient with good participation and attendance at cardiac rehab.  30 day ITP review. Patient with good participation and attendance at cardiac rehab.       Comments: See ITP comments.MariaBarnet PallBSN 01/17/2018 2:10 PM

## 2018-01-17 NOTE — Progress Notes (Signed)
HPI [name] returns today for followup. 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 He has done well in the interim. He is exercising regularly.  No Known Allergies   Current Outpatient Medications  Medication Sig Dispense Refill  . acetaminophen (TYLENOL) 500 MG tablet Take 1,000 mg by mouth daily as needed for moderate pain.    Marland Kitchen. aspirin 325 MG EC tablet Take 1 tablet (325 mg total) by mouth daily. 30 tablet 0  . losartan (COZAAR) 100 MG tablet Take 100 mg by mouth daily.    Marland Kitchen. lovastatin (MEVACOR) 40 MG tablet Take 1 tablet (40 mg total) by mouth at bedtime. 90 tablet 3  . metFORMIN (GLUCOPHAGE) 500 MG tablet TAKE ONE TABLET BY MOUTH TWICE DAILY WITH  MEAL 180 tablet 3  . metoprolol tartrate (LOPRESSOR) 50 MG tablet Take 1 tablet (50 mg total) by mouth 2 (two) times daily. 60 tablet 1  . Multiple Vitamins-Minerals (MULTIVITAMIN WITH MINERALS) tablet Take 1 tablet by mouth daily.    Marland Kitchen. omeprazole (PRILOSEC) 40 MG capsule Take 1 capsule (40 mg total) by mouth daily. 90 capsule 3  . oxyCODONE (OXY IR/ROXICODONE) 5 MG immediate release tablet Take 1 tablet (5 mg total) every 6 (six) hours as needed by mouth for severe pain. (Patient taking differently: Take 5 mg by mouth every 6 (six) hours as needed for severe pain (taking as needed). ) 30 tablet 0  . oxymetazoline (AFRIN) 0.05 % nasal spray Place 1 spray into both nostrils daily as needed for congestion.    . Tetrahydrozoline HCl (VISINE OP) Apply 1 drop to eye daily as needed (dry eyes).     No current facility-administered medications for this visit.      Past Medical History:  Diagnosis Date  . Aortic stenosis    Dr. Jacinto HalimGanji  . Arthritis   . Coronary artery disease 07/12/2017   Non occlusive CAD from cardiac catherization   . Diabetes mellitus without complication (HCC) 2014   type 2   . Former smoker   . GERD (gastroesophageal reflux disease)   .  Hyperlipidemia   . Hypertension 2012  . PVD (peripheral vascular disease) (HCC)   . 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   Past Surgical History:  Procedure Laterality Date  . AORTIC VALVE REPLACEMENT N/A 09/29/2017   Procedure: AORTIC VALVE REPLACEMENT (AVR) using 21mm Magna Ease valve;  Surgeon: Kerin PernaVan Trigt, Peter, MD;  [facility];  Service: Open Heart Surgery;  Laterality: N/A;  . CARDIAC CATHETERIZATION  07/12/2017  . COLONOSCOPY  2008  . CORONARY ARTERY BYPASS GRAFT    . LEFT AND RIGHT HEART CATHETERIZATION WITH CORONARY ANGIOGRAM N/A 04/02/2014   Procedure: LEFT AND RIGHT HEART CATHETERIZATION WITH CORONARY ANGIOGRAM;  Surgeon: Pamella PertJagadeesh R Ganji, MD;  [facility];  Service: Cardiovascular;  Laterality: N/A;  . PACEMAKER IMPLANT N/A 10/03/2017   Procedure: PACEMAKER IMPLANT;  Surgeon: Marinus Mawaylor, Gessica Jawad W, MD;  Location: [facility] INVASIVE CV LAB;  Service: Cardiovascular;  Laterality: N/A;  . TEE WITHOUT CARDIOVERSION N/A 04/30/2014   Procedure: TRANSESOPHAGEAL ECHOCARDIOGRAM (TEE);  Surgeon: Pamella PertJagadeesh R Ganji, MD;  [facility];  Service: Cardiovascular;  Laterality: N/A;  . TEE WITHOUT CARDIOVERSION N/A 09/29/2017   Procedure: TRANSESOPHAGEAL ECHOCARDIOGRAM (TEE);  Surgeon: Donata Clay, Theron Arista, MD;  Location: [facility] OR;  Service: Open Heart Surgery;  Laterality: N/A;     Family History  Problem Relation Age of Onset  . Other Mother        died of old age  . Heart disease Father   . Sleep apnea Sister   . Diabetes Brother        diabetic coma x 3 years  . Alcohol abuse Brother   . Heart disease Brother 48       MI  . Cancer Neg Hx   . Stroke Neg Hx      Social History   Socioeconomic History  . Marital status: Married    Spouse name: Not on file  . Number of children: Not on file  . Years of education: Not on file  . Highest education level: Not on file  Social Needs  .  Financial resource strain: Not on file  . Food insecurity - worry: Not on file  . Food insecurity - inability: Not on file  . Transportation needs - medical: Not on file  . Transportation needs - non-medical: Not on file  Occupational History  . Not on file  Tobacco Use  . Smoking status: Former Smoker    Last attempt to quit: 09/28/2007    Years since quitting: 10.3  . Smokeless tobacco: Never Used  Substance and Sexual Activity  . Alcohol use: No  . Drug use: Yes    Frequency: 7.0 times per week    Types: Marijuana    Comment: patient says he smokes marajuana once a day  . Sexual activity: Not on file  Other Topics Concern  . Not on file  Social History Narrative  . Not on file     BP 140/80   Pulse 100   Resp 16   Ht 5\' 9"  (1.753 m)   Wt 197 lb (89.4 kg)   SpO2 97%   BMI 29.09 kg/m   Physical Exam:  Well appearing 67 yo man, NAD HEENT: Unremarkable Neck:  6 cm JVD, no thyromegally Lymphatics:  No adenopathy Back:  No CVA tenderness Lungs:  Scattered rales and rhonchi. 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  See PaceArt for details.   Assess/Plan: 1. CHB - he is doing well, s/p PPM insertion. We will recheck in several months and I will see him back in 9 months for followup. 2. Aortic stenosis - he is s/p AVR and valve sounds good today. 3. HTN - his blood pressure is elevated a bit. He is encouraged to continue his exercise program and to reduce his salt intake.   Leonia Reeves.D.

## 2018-01-17 NOTE — Patient Instructions (Addendum)
Medication Instructions:  Your physician recommends that you continue on your current medications as directed. Please refer to the Current Medication list given to you today.   Labwork: None ordered.  Testing/Procedures: None ordered.  Follow-Up: Your physician wants you to follow-up in: in 9 months with Dr Ladona Ridgelaylor. You will receive a reminder letter in the mail two months in advance. If you don't receive a letter, please call our office to schedule the follow-up appointment.  Remote monitoring is used to monitor your Pacemaker from home. This monitoring reduces the number of office visits required to check your device to one time per year. It allows us to keep an eye on the functioning of your device to ensure it is working properly. You are scheduled for a device check from home on 04/18/2018.  You may send your transmission at any time that day. If you have a wireless device, the transmission will be sent automatically. After your physician reviews your transmission, you will receive a postcard with your next transmission date.    Any Other Special Instructions Will Be Listed Below (If Applicable).     If you need a refill on your cardiac medications before your next appointment, please call your pharmacy.

## 2018-01-18 ENCOUNTER — Encounter (HOSPITAL_COMMUNITY)
Admission: RE | Admit: 2018-01-18 | Discharge: 2018-01-18 | Disposition: A | Discharge status: Home / Self Care | Payer: Medicare HMO | Source: Ambulatory Visit | Attending: Internal Medicine | Admitting: Internal Medicine

## 2018-01-18 DIAGNOSIS — Z952 Presence of prosthetic heart valve: Secondary | ICD-10-CM

## 2018-01-18 DIAGNOSIS — Z48812 Encounter for surgical aftercare following surgery on the circulatory system: Secondary | ICD-10-CM | POA: Diagnosis not present

## 2018-01-20 ENCOUNTER — Encounter (HOSPITAL_COMMUNITY)
Admission: RE | Admit: 2018-01-20 | Discharge: 2018-01-20 | Disposition: A | Discharge status: Home / Self Care | Payer: Medicare HMO | Source: Ambulatory Visit | Attending: Cardiology | Admitting: Cardiology

## 2018-01-20 DIAGNOSIS — Z48812 Encounter for surgical aftercare following surgery on the circulatory system: Secondary | ICD-10-CM | POA: Diagnosis not present

## 2018-01-20 DIAGNOSIS — Z952 Presence of prosthetic heart valve: Secondary | ICD-10-CM

## 2018-01-20 LAB — CUP PACEART INCLINIC DEVICE CHECK
Battery Remaining Longevity: 122 mo
Battery Voltage: 3.02 V
Brady Statistic AP VS Percent: 0 %
Brady Statistic AS VS Percent: 0.04 %
Implantable Lead Implant Date: 20181008
Implantable Lead Implant Date: 20181008
Implantable Lead Location: 753860
Implantable Pulse Generator Implant Date: 20181008
Lead Channel Impedance Value: 304 Ohm
Lead Channel Impedance Value: 418 Ohm
Lead Channel Impedance Value: 475 Ohm
Lead Channel Pacing Threshold Amplitude: 0.75 V
Lead Channel Pacing Threshold Amplitude: 1 V
Lead Channel Pacing Threshold Pulse Width: 0.4 ms
Lead Channel Sensing Intrinsic Amplitude: 2.125 mV
Lead Channel Sensing Intrinsic Amplitude: 2.75 mV
Lead Channel Sensing Intrinsic Amplitude: 7.625 mV
Lead Channel Setting Pacing Amplitude: 2.5 V
Lead Channel Setting Sensing Sensitivity: 2 mV
MDC IDC LEAD LOCATION: 753859
MDC IDC MSMT LEADCHNL RA PACING THRESHOLD PULSEWIDTH: 0.4 ms
MDC IDC MSMT LEADCHNL RV IMPEDANCE VALUE: 304 Ohm
MDC IDC MSMT LEADCHNL RV SENSING INTR AMPL: 7.625 mV
MDC IDC SESS DTM: 20190122142118
MDC IDC SET LEADCHNL RA PACING AMPLITUDE: 2 V
MDC IDC SET LEADCHNL RV PACING PULSEWIDTH: 0.5 ms
MDC IDC STAT BRADY AP VP PERCENT: 3.26 %
MDC IDC STAT BRADY AS VP PERCENT: 96.7 %
MDC IDC STAT BRADY RA PERCENT PACED: 3.26 %
MDC IDC STAT BRADY RV PERCENT PACED: 99.96 %

## 2018-01-23 ENCOUNTER — Encounter (HOSPITAL_COMMUNITY)
Admission: RE | Admit: 2018-01-23 | Discharge: 2018-01-23 | Disposition: A | Discharge status: Home / Self Care | Payer: Medicare HMO | Source: Ambulatory Visit | Attending: Cardiology | Admitting: Cardiology

## 2018-01-23 DIAGNOSIS — Z952 Presence of prosthetic heart valve: Secondary | ICD-10-CM

## 2018-01-23 DIAGNOSIS — Z48812 Encounter for surgical aftercare following surgery on the circulatory system: Secondary | ICD-10-CM | POA: Diagnosis not present

## 2018-01-25 ENCOUNTER — Encounter (HOSPITAL_COMMUNITY)
Admission: RE | Admit: 2018-01-25 | Discharge: 2018-01-25 | Disposition: A | Discharge status: Home / Self Care | Payer: Medicare HMO | Source: Ambulatory Visit | Attending: Cardiology | Admitting: Cardiology

## 2018-01-25 DIAGNOSIS — Z48812 Encounter for surgical aftercare following surgery on the circulatory system: Secondary | ICD-10-CM | POA: Diagnosis not present

## 2018-01-25 DIAGNOSIS — Z952 Presence of prosthetic heart valve: Secondary | ICD-10-CM

## 2018-01-27 ENCOUNTER — Encounter (HOSPITAL_COMMUNITY)
Admission: RE | Admit: 2018-01-27 | Discharge: 2018-01-27 | Disposition: A | Discharge status: Home / Self Care | Payer: Medicare HMO | Source: Ambulatory Visit | Attending: Cardiology | Admitting: Cardiology

## 2018-01-27 DIAGNOSIS — Z952 Presence of prosthetic heart valve: Secondary | ICD-10-CM

## 2018-01-27 DIAGNOSIS — Z48812 Encounter for surgical aftercare following surgery on the circulatory system: Secondary | ICD-10-CM | POA: Diagnosis not present

## 2018-01-30 ENCOUNTER — Encounter (HOSPITAL_COMMUNITY)
Admission: RE | Admit: 2018-01-30 | Discharge: 2018-01-30 | Disposition: A | Discharge status: Home / Self Care | Payer: Medicare HMO | Source: Ambulatory Visit | Attending: Cardiology | Admitting: Cardiology

## 2018-01-30 DIAGNOSIS — Z952 Presence of prosthetic heart valve: Secondary | ICD-10-CM | POA: Diagnosis not present

## 2018-01-30 DIAGNOSIS — Z48812 Encounter for surgical aftercare following surgery on the circulatory system: Secondary | ICD-10-CM | POA: Diagnosis not present

## 2018-02-01 ENCOUNTER — Telehealth: Payer: Self-pay | Admitting: Cardiology

## 2018-02-01 ENCOUNTER — Encounter (HOSPITAL_COMMUNITY)
Admission: RE | Admit: 2018-02-01 | Discharge: 2018-02-01 | Disposition: A | Discharge status: Home / Self Care | Payer: Medicare HMO | Source: Ambulatory Visit | Attending: Cardiology | Admitting: Cardiology

## 2018-02-01 DIAGNOSIS — Z952 Presence of prosthetic heart valve: Secondary | ICD-10-CM

## 2018-02-01 DIAGNOSIS — Z48812 Encounter for surgical aftercare following surgery on the circulatory system: Secondary | ICD-10-CM | POA: Diagnosis not present

## 2018-02-01 NOTE — Telephone Encounter (Signed)
Opened in error

## 2018-02-03 ENCOUNTER — Encounter (HOSPITAL_COMMUNITY)
Admission: RE | Admit: 2018-02-03 | Discharge: 2018-02-03 | Disposition: A | Discharge status: Home / Self Care | Payer: Medicare HMO | Source: Ambulatory Visit | Attending: Cardiology | Admitting: Cardiology

## 2018-02-03 DIAGNOSIS — Z952 Presence of prosthetic heart valve: Secondary | ICD-10-CM | POA: Diagnosis not present

## 2018-02-03 DIAGNOSIS — Z48812 Encounter for surgical aftercare following surgery on the circulatory system: Secondary | ICD-10-CM | POA: Diagnosis not present

## 2018-02-06 ENCOUNTER — Encounter (HOSPITAL_COMMUNITY)
Admission: RE | Admit: 2018-02-06 | Discharge: 2018-02-06 | Disposition: A | Discharge status: Home / Self Care | Payer: Medicare HMO | Source: Ambulatory Visit | Attending: Cardiology | Admitting: Cardiology

## 2018-02-06 DIAGNOSIS — Z952 Presence of prosthetic heart valve: Secondary | ICD-10-CM

## 2018-02-06 DIAGNOSIS — Z48812 Encounter for surgical aftercare following surgery on the circulatory system: Secondary | ICD-10-CM | POA: Diagnosis not present

## 2018-02-08 ENCOUNTER — Encounter (HOSPITAL_COMMUNITY)
Admission: RE | Admit: 2018-02-08 | Discharge: 2018-02-08 | Disposition: A | Discharge status: Home / Self Care | Payer: Medicare HMO | Source: Ambulatory Visit | Attending: Cardiology | Admitting: Cardiology

## 2018-02-08 DIAGNOSIS — Z952 Presence of prosthetic heart valve: Secondary | ICD-10-CM | POA: Diagnosis not present

## 2018-02-08 DIAGNOSIS — Z48812 Encounter for surgical aftercare following surgery on the circulatory system: Secondary | ICD-10-CM | POA: Diagnosis not present

## 2018-02-09 ENCOUNTER — Other Ambulatory Visit: Payer: Self-pay | Admitting: Physician Assistant

## 2018-02-10 ENCOUNTER — Encounter (HOSPITAL_COMMUNITY)
Admission: RE | Admit: 2018-02-10 | Discharge: 2018-02-10 | Disposition: A | Discharge status: Home / Self Care | Payer: Medicare HMO | Source: Ambulatory Visit | Attending: Cardiology | Admitting: Cardiology

## 2018-02-10 ENCOUNTER — Other Ambulatory Visit: Payer: Self-pay | Admitting: Physician Assistant

## 2018-02-10 DIAGNOSIS — Z952 Presence of prosthetic heart valve: Secondary | ICD-10-CM

## 2018-02-10 DIAGNOSIS — I119 Hypertensive heart disease without heart failure: Secondary | ICD-10-CM | POA: Diagnosis not present

## 2018-02-10 DIAGNOSIS — I2510 Atherosclerotic heart disease of native coronary artery without angina pectoris: Secondary | ICD-10-CM | POA: Diagnosis not present

## 2018-02-10 DIAGNOSIS — Z48812 Encounter for surgical aftercare following surgery on the circulatory system: Secondary | ICD-10-CM | POA: Diagnosis not present

## 2018-02-10 DIAGNOSIS — Z09 Encounter for follow-up examination after completed treatment for conditions other than malignant neoplasm: Secondary | ICD-10-CM | POA: Diagnosis not present

## 2018-02-13 ENCOUNTER — Telehealth: Payer: Self-pay | Admitting: Medical

## 2018-02-13 ENCOUNTER — Encounter (HOSPITAL_COMMUNITY)
Admission: RE | Admit: 2018-02-13 | Discharge: 2018-02-13 | Disposition: A | Discharge status: Home / Self Care | Payer: Medicare HMO | Source: Ambulatory Visit | Attending: Cardiology | Admitting: Cardiology

## 2018-02-13 DIAGNOSIS — Z952 Presence of prosthetic heart valve: Secondary | ICD-10-CM

## 2018-02-13 NOTE — Telephone Encounter (Signed)
Pt came in and stated he needs a refill on metoprolol. Please send to [facility] [address]

## 2018-02-13 NOTE — Progress Notes (Signed)
Incomplete Session Note  Patient Details  Name: Jonathan Brown MRN: [id number] Date of Birth: January 17, 1951 Referring Provider:     CARDIAC REHAB [facility]  Referring Provider  [name], [name]      Jonathan Brown did not complete his rehab session.  Jonathan Brown's heart rate was noted at 118 V paced. Jonathan Brown ran out of his metoprolol on Friday. Dr Theodoro DoingPatwardwan's office called and notified. Dr Damian LeavellPatwardhan's office [facility]. Patient did not exercise today and plans to return to exercise on Wednesday. [name] left cardiac rehab to pick up his medication.Gladstone LighterMaria Whitaker, RN,BSN 02/13/2018 4:30 PM

## 2018-02-13 NOTE — Telephone Encounter (Signed)
Refill and get in for med check 

## 2018-02-14 ENCOUNTER — Encounter: Payer: Self-pay | Admitting: Medical

## 2018-02-14 ENCOUNTER — Ambulatory Visit (INDEPENDENT_AMBULATORY_CARE_PROVIDER_SITE_OTHER): Payer: Medicare HMO | Admitting: Medical

## 2018-02-14 VITALS — BP 136/82 | HR 88 | Wt 197.4 lb

## 2018-02-14 DIAGNOSIS — Z952 Presence of prosthetic heart valve: Secondary | ICD-10-CM | POA: Diagnosis not present

## 2018-02-14 DIAGNOSIS — Z1211 Encounter for screening for malignant neoplasm of colon: Secondary | ICD-10-CM

## 2018-02-14 DIAGNOSIS — I70213 Atherosclerosis of native arteries of extremities with intermittent claudication, bilateral legs: Secondary | ICD-10-CM

## 2018-02-14 DIAGNOSIS — Z7185 Encounter for immunization safety counseling: Secondary | ICD-10-CM

## 2018-02-14 DIAGNOSIS — Z950 Presence of cardiac pacemaker: Secondary | ICD-10-CM

## 2018-02-14 DIAGNOSIS — E785 Hyperlipidemia, unspecified: Secondary | ICD-10-CM | POA: Diagnosis not present

## 2018-02-14 DIAGNOSIS — I10 Essential (primary) hypertension: Secondary | ICD-10-CM

## 2018-02-14 DIAGNOSIS — E118 Type 2 diabetes mellitus with unspecified complications: Secondary | ICD-10-CM | POA: Diagnosis not present

## 2018-02-14 DIAGNOSIS — E1141 Type 2 diabetes mellitus with diabetic mononeuropathy: Secondary | ICD-10-CM | POA: Diagnosis not present

## 2018-02-14 DIAGNOSIS — Z550 Illiteracy and low-level literacy: Secondary | ICD-10-CM

## 2018-02-14 DIAGNOSIS — K219 Gastro-esophageal reflux disease without esophagitis: Secondary | ICD-10-CM | POA: Diagnosis not present

## 2018-02-14 DIAGNOSIS — Z7189 Other specified counseling: Secondary | ICD-10-CM

## 2018-02-14 DIAGNOSIS — Z87891 Personal history of nicotine dependence: Secondary | ICD-10-CM

## 2018-02-14 NOTE — Patient Instructions (Signed)
Cancer Screening Colorectal cancer screening:  Let me know if you are agreeable to referral back to the stomach specialist/Gastroenterology as you are due for repeat colonoscopy.    I recommend the following vaccines: Please call your insurer to see if they will cover the following vaccines that are recommended:  Shingrix  Pneumococcal  Tetanus Diptheria  Get a flu shot every fall  We will call back with lab results

## 2018-02-14 NOTE — Progress Notes (Signed)
Subjective: Chief Complaint  Patient presents with  . Follow-up    follow up from  cardio sugery in oct , pt stated that he doing good    Here for med check and f/u.  No particular c/o.  Since last visit here had aortic valve replacement and pacemaker placed.   Doing well currently.   Still doing cardiac rehab.   Graduates from cardiac rehab this Friday!  HTN - compliant with Losartan 100mg  daily, Metoprolol 50mg  BID,   Hyperlipidemia - compliant with Mevacor 40mg  and Aspirin 81mg  daily at bedtime  Diabetes - taking Metformin 500mg  BID.  Checking sugars daily, checks fasting in the morning.   Left his glucometer readings at home.  Thinks he runs in the 120s.  Trying to eat healthy  Past Medical History:  Diagnosis Date  . Aortic stenosis    Dr. Jacinto Halim  . Arthritis   . Coronary artery disease 07/12/2017   Non occlusive CAD from cardiac catherization   . Diabetes mellitus without complication (HCC) 2014   type 2   . Former smoker   . GERD (gastroesophageal reflux disease)   . Hyperlipidemia   . Hypertension 2012  . PVD (peripheral vascular disease) (HCC)   . 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 acetaminophen (TYLENOL) 500 MG tablet Take 1,000 mg by mouth daily as needed for moderate pain.    Marland Kitchen aspirin EC 81 MG tablet Take 81 mg by mouth daily.    Marland Kitchen losartan (COZAAR) 100 MG tablet Take 100 mg by mouth daily.    Marland Kitchen lovastatin (MEVACOR) 40 MG tablet Take 1 tablet (40 mg total) by mouth at bedtime. 90 tablet 3  . metFORMIN (GLUCOPHAGE) 500 MG tablet TAKE ONE TABLET BY MOUTH TWICE DAILY WITH  MEAL 180 tablet 3  . metoprolol tartrate (LOPRESSOR) 50 MG tablet Take 1 tablet (50 mg total) by mouth 2 (two) times daily. 60 tablet 1  . Multiple Vitamins-Minerals (MULTIVITAMIN WITH MINERALS) tablet Take 1 tablet by mouth daily.    Marland Kitchen omeprazole (PRILOSEC) 40 MG capsule Take 1 capsule  (40 mg total) by mouth daily. 90 capsule 3  . Tetrahydrozoline HCl (VISINE OP) Apply 1 drop to eye daily as needed (dry eyes).     No current facility-administered medications on file prior to visit.    ROS as in subjective   Objective: BP 136/82   Pulse 88   Wt 197 lb 6.4 oz (89.5 kg)   SpO2 96%   BMI 29.15 kg/m   Wt Readings from Last 3 Encounters:  02/14/18 197 lb 6.4 oz (89.5 kg)  01/17/18 197 lb (89.4 kg)  12/14/17 185 lb (83.9 kg)   BP Readings from Last 3 Encounters:  02/14/18 136/82  01/17/18 140/80  12/14/17 135/83   Gen: wd, wn, nad General appearance: alert, no distress, WD/WN,  Neck: supple, no lymphadenopathy, no thyromegaly, no masses, no bruits Heart: faint 2/6 murmur brief, heard throughout, otherwise RRR, normal S1, S2 Lungs: CTA bilaterally, no wheezes, rhonchi, or rales Abdomen: +bs, soft, non tender, non distended, no masses, no hepatomegaly, no splenomegaly Pulses: 1+ symmetric, upper and lower extremities, normal cap refill Ext: no edema  Diabetic Foot Exam - Simple   Simple Foot Form Diabetic Foot exam was performed with the following findings:  Yes 02/14/2018 11:02 AM  Visual Inspection See comments:  Yes Sensation  Testing Intact to touch and monofilament testing bilaterally:  Yes Pulse Check See comments:  Yes Comments 1+ pulses, hammer toe deformity mild of several toes bilat, some thickened toenails bilat         Assessment: Encounter Diagnoses  Name Primary?  . Atherosclerosis of native artery of both lower extremities with intermittent claudication (HCC) Yes  . Essential hypertension   . Gastroesophageal reflux disease without esophagitis   . Type 2 diabetes mellitus with complication, without long-term current use of insulin (HCC)   . 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 Hyperlipidemia, unspecified hyperlipidemia type   . Former smoker   . S/P placement of cardiac pacemaker   . S/P AVR  (aortic valve replacement)   . Screen for colon cancer   . 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 Vaccine counseling      Plan: Reviewed cardiology and cardiothoracic surgery notes in chart, recent cardiac rehab notes.  Glad he took care of this and had follow up with cardiology.   He was encouraged to continue exercise now that he is graduating cardiac rehab. I reviewed cardiothoracic surgery note from 12/14/17, cardiology/electrophysiology note from 11/30/17, and reviewed most recent note from Dr. Jacinto HalimGanji.  HTN - Continue same medications  Diabetes - c/t glucose monitoring, labs today, c/t same medications, daily foot checks, routine f/u  hyperlipidemia - labs today, c/t same medication  Gave handout to have wife read over to see if he is willing to update vaccines and be referred for updated colonoscopy  Jonathan Brown was seen today for follow-up.  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

## 2018-02-14 NOTE — Telephone Encounter (Signed)
Pt came in today for an appt 

## 2018-02-15 ENCOUNTER — Encounter (HOSPITAL_COMMUNITY)
Admission: RE | Admit: 2018-02-15 | Discharge: 2018-02-15 | Disposition: A | Discharge status: Home / Self Care | Payer: Medicare HMO | Source: Ambulatory Visit | Attending: Cardiology | Admitting: Cardiology

## 2018-02-15 ENCOUNTER — Other Ambulatory Visit: Payer: Self-pay | Admitting: Medical

## 2018-02-15 DIAGNOSIS — Z952 Presence of prosthetic heart valve: Secondary | ICD-10-CM

## 2018-02-15 DIAGNOSIS — E118 Type 2 diabetes mellitus with unspecified complications: Secondary | ICD-10-CM

## 2018-02-15 DIAGNOSIS — Z48812 Encounter for surgical aftercare following surgery on the circulatory system: Secondary | ICD-10-CM | POA: Diagnosis not present

## 2018-02-15 LAB — CBC WITH DIFFERENTIAL/PLATELET
BASOS ABS: 0 10*3/uL (ref 0.0–0.2)
BASOS: 1 %
EOS (ABSOLUTE): 0.1 10*3/uL (ref 0.0–0.4)
Eos: 2 %
HEMATOCRIT: 40.5 % (ref 37.5–51.0)
HEMOGLOBIN: 13.5 g/dL (ref 13.0–17.7)
IMMATURE GRANS (ABS): 0 10*3/uL (ref 0.0–0.1)
Immature Granulocytes: 0 %
LYMPHS ABS: 1.5 10*3/uL (ref 0.7–3.1)
Lymphs: 27 %
MCH: 29.1 pg (ref 26.6–33.0)
MCHC: 33.3 g/dL (ref 31.5–35.7)
MCV: 87 fL (ref 79–97)
MONOCYTES: 11 %
Monocytes Absolute: 0.6 10*3/uL (ref 0.1–0.9)
NEUTROS ABS: 3.3 10*3/uL (ref 1.4–7.0)
Neutrophils: 59 %
Platelets: 195 10*3/uL (ref 150–379)
RBC: 4.64 x10E6/uL (ref 4.14–5.80)
RDW: 15.4 % (ref 12.3–15.4)
WBC: 5.6 10*3/uL (ref 3.4–10.8)

## 2018-02-15 LAB — COMPREHENSIVE METABOLIC PANEL
A/G RATIO: 1.5 (ref 1.2–2.2)
ALBUMIN: 4.1 g/dL (ref 3.6–4.8)
ALT: 22 IU/L (ref 0–44)
AST: 16 IU/L (ref 0–40)
Alkaline Phosphatase: 98 IU/L (ref 39–117)
BUN / CREAT RATIO: 12 (ref 10–24)
BUN: 13 mg/dL (ref 8–27)
Bilirubin Total: 0.6 mg/dL (ref 0.0–1.2)
CALCIUM: 10 mg/dL (ref 8.6–10.2)
CHLORIDE: 106 mmol/L (ref 96–106)
CO2: 21 mmol/L (ref 20–29)
Creatinine, Ser: 1.05 mg/dL (ref 0.76–1.27)
GFR, EST AFRICAN AMERICAN: 85 mL/min/{1.73_m2} (ref >59)
GFR, EST NON AFRICAN AMERICAN: 74 mL/min/{1.73_m2} (ref >59)
GLUCOSE: 121 mg/dL — AB (ref 65–99)
Globulin, Total: 2.7 g/dL (ref 1.5–4.5)
Potassium: 4.4 mmol/L (ref 3.5–5.2)
Sodium: 141 mmol/L (ref 134–144)
TOTAL PROTEIN: 6.8 g/dL (ref 6.0–8.5)

## 2018-02-15 LAB — LIPID PANEL
CHOL/HDL RATIO: 5.5 ratio — AB (ref 0.0–5.0)
Cholesterol, Total: 182 mg/dL (ref 100–199)
HDL: 33 mg/dL — ABNORMAL LOW (ref >39)
LDL Calculated: 104 mg/dL — ABNORMAL HIGH (ref 0–99)
Triglycerides: 227 mg/dL — ABNORMAL HIGH (ref 0–149)
VLDL CHOLESTEROL CAL: 45 mg/dL — AB (ref 5–40)

## 2018-02-15 LAB — MICROALBUMIN / CREATININE URINE RATIO
CREATININE, UR: 329.5 mg/dL
Microalb/Creat Ratio: 5.3 mg/g creat (ref 0.0–30.0)
Microalbumin, Urine: 17.5 ug/mL

## 2018-02-15 LAB — HEMOGLOBIN A1C
ESTIMATED AVERAGE GLUCOSE: 120 mg/dL
HEMOGLOBIN A1C: 5.8 % — AB (ref 4.8–5.6)

## 2018-02-15 MED ORDER — LOSARTAN POTASSIUM 100 MG PO TABS: 100.0000 mg | ORAL_TABLET | Freq: Every day | ORAL | 3 refills | Status: DC

## 2018-02-15 MED ORDER — OMEPRAZOLE 40 MG PO CPDR: 40.0000 mg | DELAYED_RELEASE_CAPSULE | Freq: Every day | ORAL | 3 refills | Status: DC

## 2018-02-15 MED ORDER — METFORMIN HCL 500 MG PO TABS: ORAL_TABLET | ORAL | 1 refills | Status: DC

## 2018-02-15 MED ORDER — ASPIRIN EC 81 MG PO TBEC: 81.0000 mg | DELAYED_RELEASE_TABLET | Freq: Every day | ORAL | 3 refills | Status: DC

## 2018-02-15 MED ORDER — METOPROLOL TARTRATE 50 MG PO TABS: 50.0000 mg | ORAL_TABLET | Freq: Two times a day (BID) | ORAL | 3 refills | Status: DC

## 2018-02-15 NOTE — Addendum Note (Signed)
Addended by: Winn JockVALENTINE, Garrick Midgley N on: 02/15/2018 04:46 PM   Modules accepted: Orders

## 2018-02-15 NOTE — Progress Notes (Signed)
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  acetaminophen (TYLENOL) 500 MG tablet, Take 1,000 mg by mouth daily as needed for moderate pain., Disp: , Rfl:  .  aspirin EC 81 MG tablet, Take 81 mg by mouth daily., Disp: , Rfl:  .  losartan (COZAAR) 100 MG tablet, Take 100 mg by mouth daily., Disp: , Rfl:  .  lovastatin (MEVACOR) 40 MG tablet, Take 1 tablet (40 mg total) by mouth at bedtime., Disp: 90 tablet, Rfl: 3 .  metFORMIN (GLUCOPHAGE) 500 MG tablet, TAKE ONE TABLET BY MOUTH TWICE DAILY WITH  MEAL, Disp: 180 tablet, Rfl: 3 .  metoprolol tartrate (LOPRESSOR) 50 MG tablet, Take 1 tablet (50 mg total) by mouth 2 (two) times daily., Disp: 60 tablet, Rfl: 1 .  Multiple Vitamins-Minerals (MULTIVITAMIN WITH MINERALS) tablet, Take 1 tablet by mouth daily., Disp: , Rfl:  .  omeprazole (PRILOSEC) 40 MG capsule, Take 1 capsule (40 mg total) by mouth daily., Disp: 90 capsule, Rfl: 3 .  Tetrahydrozoline HCl (VISINE OP), Apply 1 drop to eye daily as needed (dry eyes)., Disp: , Rfl:   Past Medical History: Past Medical History:  Diagnosis Date  . Aortic stenosis    Dr. Einar Gip  . Arthritis   . Coronary artery disease 07/12/2017   Non occlusive CAD from cardiac catherization   . Diabetes mellitus without complication ([place]) 9562   type 2   . Former smoker   . GERD (gastroesophageal reflux disease)   . Hyperlipidemia   . Hypertension 2012  . PVD  (peripheral vascular disease) ([place])   . 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 Last attempt to quit: 09/28/2007  . Years since quitting: 10.3  Smokeless Tobacco Never Used    Labs: Recent Chemical engineer    Labs for ITP Cardiac and Pulmonary Rehab Latest Ref Rng & Units 09/29/2017 09/29/2017 10/01/2017 10/02/2017 02/14/2018   Cholestrol 100 - 199 mg/dL - - - - 182   LDLCALC 0 - 99 mg/dL - - - - 104(H)   HDL >39 mg/dL - - - - 33(L)   Trlycerides 0 - 149 mg/dL - - - - 227(H)   Hemoglobin A1c 4.8 - 5.6 % - - - - 5.8(H)   PHART 7.350 - 7.450 - 7.345(L) - - -   PCO2ART 32.0 - 48.0 mmHg - 36.2 - - -   HCO3 20.0 - 28.0 mmol/L - 19.6(L) - - -   TCO2 22 - 32 mmol/L 23 21(L) 23 23 -   ACIDBASEDEF 0.0 - 2.0 mmol/L - 5.0(H) - - -   O2SAT % - 96.0 - - -      Capillary Blood Glucose: Lab Results  Component Value Date   GLUCAP 108 (H) 12/30/2017   GLUCAP 143 (H) 12/28/2017  GLUCAP 144 (H) 12/26/2017   GLUCAP 131 (H) 12/23/2017   GLUCAP 227 (H) 12/16/2017     Exercise Target Goals:    Exercise Program Goal: Individual exercise prescription set using results from initial 6 min walk test and THRR while considering  patient's activity barriers and safety.   Exercise Prescription Goal: Initial exercise prescription builds to 30-45 minutes a day of aerobic activity, 2-3 days per week.  Home exercise guidelines will be given to patient during program as part of exercise prescription that the participant will acknowledge.  Activity Barriers & Risk Stratification: Activity Barriers & Cardiac Risk Stratification - 11/15/17 0913      Activity Barriers & Cardiac Risk Stratification   Activity Barriers  Back Problems;Deconditioning;Muscular Weakness    Cardiac Risk Stratification  High       6 Minute Walk: 6 Minute Walk    Row Name 11/15/17 1117         6 Minute Walk   Phase  Initial      Distance  1440 feet     Walk Time  6 minutes     # of Rest Breaks  0     MPH  2.73     METS  3.33     RPE  13     VO2 Peak  11.66     Symptoms  Yes (comment)     Comments  B leg pain     Resting HR  74 bpm     Resting BP  128/80     Resting Oxygen Saturation   98 %     Exercise Oxygen Saturation  during 6 min walk  9 %     Max Ex. HR  97 bpm     Max Ex. BP  140/80     2 Minute Post BP  118/78        Oxygen Initial Assessment:   Oxygen Re-Evaluation:   Oxygen Discharge (Final Oxygen Re-Evaluation):   Initial Exercise Prescription: Initial Exercise Prescription - 11/15/17 1100      Date of Initial Exercise RX and Referring Provider   Date  11/15/17    Referring Provider  Vernell Leep MD      Bike   Level  0.7    Minutes  10    METs  2.55      NuStep   Level  30    SPM  80    Minutes  10    METs  2.5      Track   Laps  10    Minutes  10    METs  2.74      Prescription Details   Frequency (times per week)  3    Duration  Progress to 30 minutes of continuous aerobic without signs/symptoms of physical distress      Intensity   THRR 40-80% of Max Heartrate  62-124    Ratings of Perceived Exertion  11-13    Perceived Dyspnea  0-4      Progression   Progression  Continue to progress workloads to maintain intensity without signs/symptoms of physical distress.      Resistance Training   Training Prescription  Yes    Weight  3lbs    Reps  10-15       Perform Capillary Blood Glucose checks as needed.  Exercise Prescription Changes: Exercise Prescription Changes    Row Name 11/23/17 [phone] 12/07/17 1100 12/21/17 1100 01/09/18 0948 01/23/18 0951     Response  to Exercise   Blood Pressure (Admit)  124/80  138/88  154/78  144/90  140/82   Blood Pressure (Exercise)  128/80  132/78  150/90  138/80  160/90   Blood Pressure (Exit)  122/64  132/90  118/84  120/80  150/88 RECHECK 142/82   Heart Rate (Admit)  77 bpm  87 bpm  93 bpm  75 bpm  72 bpm    Heart Rate (Exercise)  90 bpm  105 bpm  102 bpm  103 bpm  101 bpm   Heart Rate (Exit)  68 bpm  85 bpm  76 bpm  77 bpm  61 bpm   Rating of Perceived Exertion (Exercise)  13  11  12  12  12    Symptoms  none  none  none  none  none   Duration  Progress to 30 minutes of  aerobic without signs/symptoms of physical distress  Progress to 30 minutes of  aerobic without signs/symptoms of physical distress  Continue with 30 min of aerobic exercise without signs/symptoms of physical distress.  Continue with 30 min of aerobic exercise without signs/symptoms of physical distress.  Continue with 30 min of aerobic exercise without signs/symptoms of physical distress.   Intensity  THRR unchanged  THRR unchanged  THRR unchanged  THRR unchanged  THRR unchanged     Progression   Progression  Continue to progress workloads to maintain intensity without signs/symptoms of physical distress.  Continue to progress workloads to maintain intensity without signs/symptoms of physical distress.  Continue to progress workloads to maintain intensity without signs/symptoms of physical distress.  Continue to progress workloads to maintain intensity without signs/symptoms of physical distress.  Continue to progress workloads to maintain intensity without signs/symptoms of physical distress.   Average METs  2.4  2.4  3.1  3.1  3.2     Resistance Training   Training Prescription  No Relaxation today  Yes  Yes  Yes  Yes   Weight  -  3lbs  3lbs  4lbs  4lbs   Reps  -  10-15  10-15  10-15  10-15   Time  -  10 Minutes  10 Minutes  10 Minutes  10 Minutes     Interval Training   Interval Training  No  No  No  No  No     Bike   Level  0.7  0.8  1.1  1.1  1.3   Minutes  10  10  10  10  10    METs  2.56  2.76  3.38  3.37  3.77     NuStep   Level  3  4  4  4  4    SPM  80  60  60  60  60   Minutes  10  10  10  10  10    METs  1.8  1.9  2.1  2  1.9     Track   Laps  11  8 Break taken while talking with RD.  16  16  16    Minutes  10   10  10  10  10    METs  2.92  2.39  3.79  3.79  3.78     Home Exercise Plan   Plans to continue exercise at  -  Home (comment)  Home (comment)  Home (comment)  Home (comment)   Frequency  -  Add 3 additional days to program exercise sessions.  Add 3 additional days to program exercise  sessions.  Add 3 additional days to program exercise sessions.  Add 3 additional days to program exercise sessions.   Initial Home Exercises Provided  -  12/02/17  12/02/17  12/02/17  12/02/17   Row Name 02/06/18 0949             Response to Exercise   Blood Pressure (Admit)  132/87       Blood Pressure (Exercise)  160/84       Blood Pressure (Exit)  124/80 RECHECK 142/82       Heart Rate (Admit)  78 bpm       Heart Rate (Exercise)  101 bpm       Heart Rate (Exit)  76 bpm       Rating of Perceived Exertion (Exercise)  12       Symptoms  none       Duration  Continue with 30 min of aerobic exercise without signs/symptoms of physical distress.       Intensity  THRR unchanged         Progression   Progression  Continue to progress workloads to maintain intensity without signs/symptoms of physical distress.       Average METs  3         Resistance Training   Training Prescription  Yes       Weight  4lbs       Reps  10-15       Time  10 Minutes         Interval Training   Interval Training  No         Bike   Level  1.3       Minutes  10       METs  3.76         NuStep   Level  4       SPM  60       Minutes  10       METs  1.9         Track   Laps  8       Minutes  6       METs  3.24         Home Exercise Plan   Plans to continue exercise at  Home (comment)       Frequency  Add 3 additional days to program exercise sessions.       Initial Home Exercises Provided  12/02/17          Exercise Comments: Exercise Comments    Row Name 11/23/17 1208 12/02/17 1031 12/07/17 1012 12/21/17 1014 01/09/18 0948   Exercise Comments  Reviewed METs with patient.  Reviewed home exercise guidelines  with patient.  Reviewed METs and goals with patient.  Reviewed METs with patient.  Reviewed METs and goals with patient.   [place] Name 01/23/18 7858 02/06/18 0949         Exercise Comments  Reviewed METs with patient.  Reviewed METs and goals with patient.         Exercise Goals and Review: Exercise Goals    Row Name 11/15/17 0917             Exercise Goals   Increase Physical Activity  Yes       Intervention  Provide advice, education, support and counseling about physical activity/exercise needs.;Develop an individualized exercise prescription for aerobic and resistive training based on initial evaluation findings, risk stratification, comorbidities and participant's personal goals.  Expected Outcomes  Achievement of increased cardiorespiratory fitness and enhanced flexibility, muscular endurance and strength shown through measurements of functional capacity and personal statement of participant.       Increase Strength and Stamina  Yes increase UE strength to return to yardwork and manual labor.  Learn how to walk on treadmill       Intervention  Provide advice, education, support and counseling about physical activity/exercise needs.;Develop an individualized exercise prescription for aerobic and resistive training based on initial evaluation findings, risk stratification, comorbidities and participant's personal goals.       Expected Outcomes  Achievement of increased cardiorespiratory fitness and enhanced flexibility, muscular endurance and strength shown through measurements of functional capacity and personal statement of participant.       Able to understand and use rate of perceived exertion (RPE) scale  Yes       Intervention  Provide education and explanation on how to use RPE scale       Expected Outcomes  Short Term: Able to use RPE daily in rehab to express subjective intensity level;Long Term:  Able to use RPE to guide intensity level when exercising independently        Knowledge and understanding of Target Heart Rate Range (THRR)  Yes       Intervention  Provide education and explanation of THRR including how the numbers were predicted and where they are located for reference       Expected Outcomes  Short Term: Able to state/look up THRR;Long Term: Able to use THRR to govern intensity when exercising independently;Short Term: Able to use daily as guideline for intensity in rehab       Able to check pulse independently  Yes       Intervention  Provide education and demonstration on how to check pulse in carotid and radial arteries.;Review the importance of being able to check your own pulse for safety during independent exercise       Expected Outcomes  Short Term: Able to explain why pulse checking is important during independent exercise;Long Term: Able to check pulse independently and accurately       Understanding of Exercise Prescription  Yes       Intervention  Provide education, explanation, and written materials on patient's individual exercise prescription       Expected Outcomes  Short Term: Able to explain program exercise prescription;Long Term: Able to explain home exercise prescription to exercise independently          Exercise Goals Re-Evaluation : Exercise Goals Re-Evaluation    Row Name 11/23/17 1208 12/02/17 1031 12/07/17 1012 01/09/18 0948 02/06/18 0949     Exercise Goal Re-Evaluation   Exercise Goals Review  Increase Physical Activity  Understanding of Exercise Prescription;Able to understand and use rate of perceived exertion (RPE) scale;Knowledge and understanding of Target Heart Rate Range (THRR)  Increase Strength and Stamina;Increase Physical Activity  Increase Strength and Stamina  Increase Strength and Stamina   Comments  Off to a good start with exercise.  Reviewed home exercise guidelines with patient including THRR, RPE scale, and endpoints for exercise.  Patient states he walking and doing stretches every other day for his home  exercise. Patient wants to increase upper body strength. Encouraged patient to increase handwts from 3lbs to 4lbs next session. Also, encouraged patient to increase SPM on NuStep to 60-70 from 44.  Patient states that his strength and stamina has imnproved "100%", and that he's "exactly where he wants to be".  [facility]   Expected Outcomes  Increase workloads as tolerated.  Walk at least 30 minutes, 1 day/week in addition to exercise at cardiac rehab to help increase physical activity and progress towards health and fitness goals.  Increase workloads as tolerated to help achieve goal of increased strength, particularly upper body strength.  Increased hand weights from 3 to 4lbs to help achieve goal to increase upper strength. Increase pace on stepper to increase MET level.  [facility] and [facility].       Discharge Exercise Prescription (Final Exercise Prescription Changes): Exercise Prescription Changes - 02/06/18 0949      Response to Exercise   Blood Pressure (Admit)  132/87    Blood Pressure (Exercise)  160/84    Blood Pressure (Exit)  124/80 RECHECK 142/82    Heart Rate (Admit)  78 bpm    Heart Rate (Exercise)  101 bpm    Heart Rate (Exit)  76 bpm    Rating of Perceived Exertion (Exercise)  12    Symptoms  none    Duration  Continue with 30 min of aerobic exercise without signs/symptoms of physical distress.    Intensity  THRR unchanged      Progression   Progression  Continue to progress workloads to maintain intensity without signs/symptoms of physical distress.    Average METs  3      Resistance Training   Training Prescription  Yes    Weight  4lbs    Reps  10-15    Time  10 Minutes      Interval Training   Interval Training  No      Bike   Level  1.3    Minutes  10    METs  3.76      NuStep   Level  4    SPM  60    Minutes  10     METs  1.9      Track   Laps  8    Minutes  6    METs  3.24      Home Exercise Plan   Plans to continue exercise at  Home (comment)    Frequency  Add 3 additional days to program exercise sessions.    Initial Home Exercises Provided  12/02/17       Nutrition:  Target Goals: Understanding of nutrition guidelines, daily intake of sodium <1581m, cholesterol <2058m calories 30% from fat and 7% or less from saturated fats, daily to have 5 or more servings of fruits and vegetables.  Biometrics: Pre Biometrics - 11/15/17 1201      Pre Biometrics   Height  5' 6.5" (1.689 m)    Weight  185 lb 6.5 oz (84.1 kg)    Waist Circumference  41 inches    Hip Circumference  40 inches    Waist to Hip Ratio  1.02 %    BMI (Calculated)  29.48    Triceps Skinfold  15 mm    % Body Fat  28.8 %    Grip Strength  36 kg    Flexibility  7.5 in    Single Leg Stand  30 seconds        Nutrition Therapy Plan and Nutrition Goals: Nutrition Therapy & Goals - 11/15/17 1533      Nutrition Therapy   Diet  Carb Modified, Heart Healthy  Personal Nutrition Goals   Nutrition Goal  Pt to identify and limit food sources of saturated fat, trans fat, and sodium    Personal Goal #2  Pt to identify food quantities necessary to achieve weight loss of 6-10 lb  at graduation from cardiac rehab.      Intervention Plan   Intervention  Prescribe, educate and counsel regarding individualized specific dietary modifications aiming towards targeted core components such as weight, hypertension, lipid management, diabetes, heart failure and other comorbidities.    Expected Outcomes  Short Term Goal: Understand basic principles of dietary content, such as calories, fat, sodium, cholesterol and nutrients.;Long Term Goal: Adherence to prescribed nutrition plan.       Nutrition Assessments: Nutrition Assessments - 11/15/17 1533      MEDFICTS Scores   Pre Score  72       Nutrition Goals Re-Evaluation:   Nutrition  Goals Re-Evaluation:   Nutrition Goals Discharge (Final Nutrition Goals Re-Evaluation):   Psychosocial: Target Goals: Acknowledge presence or absence of significant depression and/or stress, maximize coping skills, provide positive support system. Participant is able to verbalize types and ability to use techniques and skills needed for reducing stress and depression.  Initial Review & Psychosocial Screening: Initial Psych Review & Screening - 11/15/17 1159      Initial Review   Current issues with  None Identified      Family Dynamics   Good Support System?  Yes    Comments  no psychosocial needs identified, no interventions necessary       Barriers   Psychosocial barriers to participate in program  There are no identifiable barriers or psychosocial needs.      Screening Interventions   Interventions  Encouraged to exercise       Quality of Life Scores: Quality of Life - 11/15/17 1033      Quality of Life Scores   Health/Function Pre  26.6 %    Socioeconomic Pre  24.6 %    Psych/Spiritual Pre  27.7 %    Family Pre  28.8 %    GLOBAL Pre  26.69 %      Scores of 19 and below usually indicate a poorer quality of life in these areas.  A difference of  2-3 points is a clinically meaningful difference.  A difference of 2-3 points in the total score of the Quality of Life Index has been associated with significant improvement in overall quality of life, self-image, physical symptoms, and general health in studies assessing change in quality of life.  PHQ-9: Recent Review Flowsheet Data    Depression screen [facility] 2/9 06/14/2017   Decreased Interest 0   Down, Depressed, Hopeless 0   PHQ - 2 Score 0     Interpretation of Total Score  Total Score Depression Severity:  1-4 = Minimal depression, 5-9 = Mild depression, 10-14 = Moderate depression, 15-19 = Moderately severe depression, 20-27 = Severe depression   Psychosocial Evaluation and Intervention:   Psychosocial  Re-Evaluation: Psychosocial Re-Evaluation    [place] Name 12/22/17 1002 01/17/18 1405 02/15/18 1114         Psychosocial Re-Evaluation   Current issues with  None Identified  None Identified  None Identified     Interventions  Encouraged to attend Cardiac Rehabilitation for the exercise  Encouraged to attend Cardiac Rehabilitation for the exercise  Encouraged to attend Cardiac Rehabilitation for the exercise     Continue Psychosocial Services   No Follow up required  No Follow up required  No Follow up required        Psychosocial Discharge (Final Psychosocial Re-Evaluation): Psychosocial Re-Evaluation - 02/15/18 1114      Psychosocial Re-Evaluation   Current issues with  None Identified    Interventions  Encouraged to attend Cardiac Rehabilitation for the exercise    Continue Psychosocial Services   No Follow up required       Vocational Rehabilitation: Provide vocational rehab assistance to qualifying candidates.   Vocational Rehab Evaluation & Intervention: Vocational Rehab - 11/15/17 0916      Initial Vocational Rehab Evaluation & Intervention   Assessment shows need for Vocational Rehabilitation  No retired [place]       Education: Education Goals: Education classes will be provided on a weekly basis, covering required topics. Participant will state understanding/return demonstration of topics presented.  Learning Barriers/Preferences: Learning Barriers/Preferences - 11/15/17 1057      Learning Barriers/Preferences   Learning Barriers  Reading;Sight       Education Topics: Count Your Pulse:  -Group instruction provided by verbal instruction, demonstration, patient participation and written materials to support subject.  Instructors address importance of being able to find your pulse and how to count your pulse when at home without a heart monitor.  Patients get hands on experience counting their pulse with staff help and  individually.   Heart Attack, Angina, and Risk Factor Modification:  -Group instruction provided by verbal instruction, video, and written materials to support subject.  Instructors address signs and symptoms of angina and heart attacks.    Also discuss risk factors for heart disease and how to make changes to improve heart health risk factors.   Functional Fitness:  -Group instruction provided by verbal instruction, demonstration, patient participation, and written materials to support subject.  Instructors address safety measures for doing things around the house.  Discuss how to get up and down off the floor, how to pick things up properly, how to safely get out of a chair without assistance, and balance training.   Meditation and Mindfulness:  -Group instruction provided by verbal instruction, patient participation, and written materials to support subject.  Instructor addresses importance of mindfulness and meditation practice to help reduce stress and improve awareness.  Instructor also leads participants through a meditation exercise.    Stretching for Flexibility and Mobility:  -Group instruction provided by verbal instruction, patient participation, and written materials to support subject.  Instructors lead participants through series of stretches that are designed to increase flexibility thus improving mobility.  These stretches are additional exercise for major muscle groups that are typically performed during regular warm up and cool down.   Hands Only CPR:  -Group verbal, video, and participation provides a basic overview of AHA guidelines for community CPR. Role-play of emergencies allow participants the opportunity to practice calling for help and chest compression technique with discussion of AED use.   Hypertension: -Group verbal and written instruction that provides a basic overview of hypertension including the most recent diagnostic guidelines, risk factor reduction with  self-care instructions and medication management.    Nutrition I class: Heart Healthy Eating:  -Group instruction provided by PowerPoint slides, verbal discussion, and written materials to support subject matter. The instructor gives an explanation and review of the Therapeutic Lifestyle Changes diet recommendations, which includes a discussion on lipid goals, dietary fat, sodium, fiber, plant stanol/sterol esters, sugar, and the components of a well-balanced, healthy diet.   CARDIAC REHAB PHASE II EXERCISE from 12/07/2017 in [place]  [place]  Date  11/15/17  Educator  RD  Instruction Review Code (Retired)  Not applicable      Nutrition II class: Lifestyle Skills:  -Group instruction provided by [employer], verbal discussion, and written materials to support subject matter. The instructor gives an explanation and review of label reading, grocery shopping for heart health, heart healthy recipe modifications, and ways to make healthier choices when eating out.   CARDIAC REHAB PHASE II EXERCISE from 12/07/2017 in [facility]  Date  11/15/17  Educator  RD  Instruction Review Code (Retired)  Not applicable      Diabetes Question & Answer:  -Group instruction provided by PowerPoint slides, verbal discussion, and written materials to support subject matter. The instructor gives an explanation and review of diabetes co-morbidities, pre- and post-prandial blood glucose goals, pre-exercise blood glucose goals, signs, symptoms, and treatment of hypoglycemia and hyperglycemia, and foot care basics.   Diabetes Blitz:  -Group instruction provided by PowerPoint slides, verbal discussion, and written materials to support subject matter. The instructor gives an explanation and review of the physiology behind type 1 and type 2 diabetes, diabetes medications and rational behind using different medications, pre- and post-prandial blood glucose  recommendations and Hemoglobin A1c goals, diabetes diet, and exercise including blood glucose guidelines for exercising safely.    CARDIAC REHAB PHASE II EXERCISE from 12/07/2017 in [place]  Date  11/15/17  Educator  RD  Instruction Review Code (Retired)  Not applicable      Portion Distortion:  -Group instruction provided by [employer], verbal discussion, written materials, and food models to support subject matter. The instructor gives an explanation of serving size versus portion size, changes in portions sizes over the last 20 years, and what consists of a serving from each food group.   Stress Management:  -Group instruction provided by verbal instruction, video, and written materials to support subject matter.  Instructors review role of stress in heart disease and how to cope with stress positively.     Exercising on Your Own:  -Group instruction provided by verbal instruction, power point, and written materials to support subject.  Instructors discuss benefits of exercise, components of exercise, frequency and intensity of exercise, and end points for exercise.  Also discuss use of nitroglycerin and activating EMS.  Review options of places to exercise outside of rehab.  Review guidelines for sex with heart disease.   Cardiac Drugs I:  -Group instruction provided by verbal instruction and written materials to support subject.  Instructor reviews cardiac drug classes: antiplatelets, anticoagulants, beta blockers, and statins.  Instructor discusses reasons, side effects, and lifestyle considerations for each drug class.   Cardiac Drugs II:  -Group instruction provided by verbal instruction and written materials to support subject.  Instructor reviews cardiac drug classes: angiotensin converting enzyme inhibitors (ACE-I), angiotensin II receptor blockers (ARBs), nitrates, and calcium channel blockers.  Instructor discusses reasons, side effects,  and lifestyle considerations for each drug class.   Anatomy and Physiology of the Circulatory System:  Group verbal and written instruction and models provide basic cardiac anatomy and physiology, with the coronary electrical and arterial systems. Review of: AMI, Angina, Valve disease, Heart Failure, Peripheral Artery Disease, Cardiac Arrhythmia, Pacemakers, and the ICD.   CARDIAC REHAB PHASE II EXERCISE from 12/07/2017 in [place]  Date  11/23/17  Instruction Review Code (Retired)  2- meets goals/outcomes      Other Education:  -  Group or individual verbal, written, or video instructions that support the educational goals of the cardiac rehab program.   Holiday Eating Survival Tips:  -Group instruction provided by PowerPoint slides, verbal discussion, and written materials to support subject matter. The instructor gives patients tips, tricks, and techniques to help them not only survive but enjoy the holidays despite the onslaught of food that accompanies the holidays.   Knowledge Questionnaire Score: Knowledge Questionnaire Score - 11/15/17 0916      Knowledge Questionnaire Score   Pre Score  15/24       Core Components/Risk Factors/Patient Goals at Admission: Personal Goals and Risk Factors at Admission - 11/15/17 1122      Core Components/Risk Factors/Patient Goals on Admission    Weight Management  Yes;Weight Maintenance;Weight Loss    Intervention  Weight Management: Develop a combined nutrition and exercise program designed to reach desired caloric intake, while maintaining appropriate intake of nutrient and fiber, sodium and fats, and appropriate energy expenditure required for the weight goal.;Weight Management/Obesity: Establish reasonable short term and long term weight goals.;Weight Management: Provide education and appropriate resources to help participant work on and attain dietary goals.    Admit Weight  185 lb 6.5 oz (84.1 kg)    Goal  Weight: Short Term  180 lb (81.6 kg)    Goal Weight: Long Term  175 lb (79.4 kg)    Expected Outcomes  Short Term: Continue to assess and modify interventions until short term weight is achieved;Long Term: Adherence to nutrition and physical activity/exercise program aimed toward attainment of established weight goal;Weight Maintenance: Understanding of the daily nutrition guidelines, which includes 25-35% calories from fat, 7% or less cal from saturated fats, less than 276m cholesterol, less than 1.5gm of sodium, & 5 or more servings of fruits and vegetables daily;Weight Loss: Understanding of general recommendations for a balanced deficit meal plan, which promotes 1-2 lb weight loss per week and includes a negative energy balance of [id number] kcal/d;Understanding recommendations for meals to include 15-35% energy as protein, 25-35% energy from fat, 35-60% energy from carbohydrates, less than 2020mof dietary cholesterol, 20-35 gm of total fiber daily;Understanding of distribution of calorie intake throughout the day with the consumption of 4-5 meals/snacks    Diabetes  Yes    Intervention  Provide education about signs/symptoms and action to take for hypo/hyperglycemia.;Provide education about proper nutrition, including hydration, and aerobic/resistive exercise prescription along with prescribed medications to achieve blood glucose in normal ranges: Fasting glucose 65-99 mg/dL    Expected Outcomes  Short Term: Participant verbalizes understanding of the signs/symptoms and immediate care of hyper/hypoglycemia, proper foot care and importance of medication, aerobic/resistive exercise and nutrition plan for blood glucose control.;Long Term: Attainment of HbA1C < 7%.    Hypertension  Yes    Intervention  Provide education on lifestyle modifcations including regular physical activity/exercise, weight management, moderate sodium restriction and increased consumption of fresh fruit, vegetables, and low fat dairy,  alcohol moderation, and smoking cessation.;Monitor prescription use compliance.    Expected Outcomes  Short Term: Continued assessment and intervention until BP is < 140/9052mG in hypertensive participants. < 130/84m23m in hypertensive participants with diabetes, heart failure or chronic kidney disease.;Long Term: Maintenance of blood pressure at goal levels.    Lipids  Yes    Intervention  Provide education and support for participant on nutrition & aerobic/resistive exercise along with prescribed medications to achieve LDL <70mg10mL >40mg.106mExpected Outcomes  Short Term: Participant states understanding of  desired cholesterol values and is compliant with medications prescribed. Participant is following exercise prescription and nutrition guidelines.;Long Term: Cholesterol controlled with medications as prescribed, with individualized exercise RX and with personalized nutrition plan. Value goals: LDL < 12m, HDL > 40 mg.       Core Components/Risk Factors/Patient Goals Review:  Goals and Risk Factor Review    Row Name 12/22/17 0111712/27/18 1003 01/17/18 1405 02/15/18 1113       Core Components/Risk Factors/Patient Goals Review   Personal Goals Review  Weight Management/Obesity;Lipids;Hypertension;Diabetes  Weight Management/Obesity;Lipids;Hypertension;Diabetes  Weight Management/Obesity;Lipids;Hypertension;Diabetes  Weight Management/Obesity;Lipids;Hypertension;Diabetes    Review  RErmiashas gained weight and has had some intermittent blood pressure elevations at cardiac rehab  RKorinhas gained weight and has had some intermittent blood pressure elevations at cardiac rehab. Vuong's CBG's have been stable.  Okechukwu has gained weight and has had some intermittent blood pressure elevations at cardiac rehab. Mills's CBG's have been stable.  Zaylon has gained weight and has had some intermittent blood pressure elevations at cardiac rehab. Willliam's CBG's have been stable.    Expected  Outcomes  RHudsonwill continue to watch his dietary intake and monitor his blood pressure. Dr PBonney Rousseloffice has been notfied about weight gain and blood pressures at cardiac rehab.  Dollie will continue to watch his dietary intake and monitor his blood pressure. Dr PBonney Rousseloffice has been notfied about weight gain and blood pressures at cardiac rehab.  Alim will continue to watch his dietary intake and monitor his blood pressure. Dr PBonney Rousseloffice has been notfied about weight gain and blood pressures at cardiac rehab.  Landrum will continue to watch his dietary intake and monitor his blood pressure. Dr PBonney Rousseloffice has been notfied about weight gain and blood pressures at cardiac rehab.       Core Components/Risk Factors/Patient Goals at Discharge (Final Review):  Goals and Risk Factor Review - 02/15/18 1113      Core Components/Risk Factors/Patient Goals Review   Personal Goals Review  Weight Management/Obesity;Lipids;Hypertension;Diabetes    Review  RRossihas gained weight and has had some intermittent blood pressure elevations at cardiac rehab. Amritpal's CBG's have been stable.    Expected Outcomes  RAntonwill continue to watch his dietary intake and monitor his blood pressure. Dr PBonney Rousseloffice has been notfied about weight gain and blood pressures at cardiac rehab.       ITP Comments: ITP Comments    Row Name 11/15/17 0902 11/25/17 1647 12/22/17 0958 01/17/18 1405 02/15/18 1113   ITP Comments  Dr. TFransico Him Medical Director   30 day ITP review. Patient with good participation and attendance at cardiac rehab.  30 day ITP review. Patient with good participation and attendance at cardiac rehab.  30 day ITP review. Patient with good participation and attendance at cardiac rehab.  30 day ITP review. Patient with good participation and attendance at cardiac rehab.      Comments: See ITP comments.MBarnet Pall RN,BSN 02/15/2018 11:17 AM

## 2018-02-17 ENCOUNTER — Encounter (HOSPITAL_COMMUNITY)
Admission: RE | Admit: 2018-02-17 | Discharge: 2018-02-17 | Disposition: A | Discharge status: Home / Self Care | Payer: Medicare HMO | Source: Ambulatory Visit | Attending: Cardiology | Admitting: Cardiology

## 2018-02-17 DIAGNOSIS — Z48812 Encounter for surgical aftercare following surgery on the circulatory system: Secondary | ICD-10-CM | POA: Diagnosis not present

## 2018-02-17 DIAGNOSIS — Z952 Presence of prosthetic heart valve: Secondary | ICD-10-CM | POA: Diagnosis not present

## 2018-02-17 NOTE — Progress Notes (Signed)
Discharge Progress Report  Patient Details  Name: Jonathan Brown MRN: [id number] Date of Birth: 04/27/1951 Referring Provider:     CARDIAC REHAB PHASE II ORIENTATION from 11/15/2017 in [facility]  Referring Provider  Patwardhan, [name]       Number of Visits: 36  Reason for Discharge:  Patient independent in their exercise.  Smoking History:  Social History   Tobacco Use  Smoking Status Former Smoker  . Last attempt to quit: 09/28/2007  . 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  ADL [facility]:   Initial Exercise Prescription: Initial Exercise Prescription - 11/15/17 1100      Date of Initial Exercise RX and Referring Provider   Date  11/15/17    Referring Provider  Patwardhan, [name]      Bike   Level  0.7    Minutes  10    METs  2.55      NuStep   Level  30    SPM  80    Minutes  10    METs  2.5      Track   Laps  10    Minutes  10    METs  2.74      Prescription Details   Frequency (times per week)  3    Duration  Progress to 30 minutes of continuous aerobic without signs/symptoms of physical distress      Intensity   THRR 40-80% of Max Heartrate  62-124    Ratings of Perceived Exertion  11-13    Perceived Dyspnea  0-4      Progression   Progression  Continue to progress workloads to maintain intensity without signs/symptoms of physical distress.      Resistance Training   Training Prescription  Yes    Weight  3lbs    Reps  10-15       Discharge Exercise Prescription (Final Exercise Prescription Changes): Exercise Prescription Changes - 02/06/18 0949      Response to Exercise   Blood Pressure (Admit)  132/87    Blood Pressure (Exercise)  160/84    Blood Pressure (Exit)  124/80 RECHECK 142/82    Heart Rate (Admit)  78 bpm    Heart Rate (Exercise)  101 bpm    Heart Rate (Exit)  76 bpm    Rating of Perceived Exertion (Exercise)  12    Symptoms  none     Duration  Continue with 30 min of aerobic exercise without signs/symptoms of physical distress.    Intensity  THRR unchanged      Progression   Progression  Continue to progress workloads to maintain intensity without signs/symptoms of physical distress.    Average METs  3      Resistance Training   Training Prescription  Yes    Weight  4lbs    Reps  10-15    Time  10 Minutes      Interval Training   Interval Training  No      Bike   Level  1.3    Minutes  10    METs  3.76      NuStep   Level  4    SPM  60    Minutes  10    METs  1.9      Track   Laps  8    Minutes  6    METs  3.24        Home Exercise Plan   Plans to continue exercise at  Home (comment)    Frequency  Add 3 additional days to program exercise sessions.    Initial Home Exercises Provided  12/02/17       Functional Capacity: 6 Minute Walk    Row Name 11/15/17 1117 02/06/18 1001       6 Minute Walk   Phase  Initial  Discharge    Distance  1440 feet  1548 feet    Distance % Change  -  7.5 %    Walk Time  6 minutes  6 minutes    # of Rest Breaks  0  0    MPH  2.73  2.93    METS  3.33  3.59    RPE  13  12    VO2 Peak  11.66  12.56    Symptoms  Yes (comment)  No    Comments  B leg pain  -    Resting HR  74 bpm  78 bpm    Resting BP  128/80  132/87    Resting Oxygen Saturation   98 %  -    Exercise Oxygen Saturation  during 6 min walk  9 %  -    Max Ex. HR  97 bpm  101 bpm    Max Ex. BP  140/80  160/84    2 Minute Post BP  118/78  124/80       Psychological, QOL, Others - Outcomes: PHQ 2/9: Depression screen [facility] 2/9 02/17/2018 06/14/2017  Decreased Interest 0 0  Down, Depressed, Hopeless 0 0  PHQ - 2 Score 0 0    Quality of Life: Quality of Life - 02/20/18 0948      Quality of Life Scores   Health/Function Pre  26.6 %    Health/Function Post  24.8 %    Health/Function % Change  -6.77 %    Socioeconomic Pre  24.6 %    Socioeconomic Post  23.07 %    Socioeconomic % Change    -6.22 %    Psych/Spiritual Pre  27.7 %    Psych/Spiritual Post  30 %    Psych/Spiritual % Change  8.3 %    Family Pre  28.8 %    Family Post  22.5 %    Family % Change  -21.88 %    GLOBAL Pre  26.69 %    GLOBAL Post  25.26 %    GLOBAL % Change  -5.36 %       Personal Goals: Goals established at orientation with interventions provided to work toward goal. Personal Goals and Risk Factors at Admission - 11/15/17 1122      Core Components/Risk Factors/Patient Goals on Admission    Weight Management  Yes;Weight Maintenance;Weight Loss    Intervention  Weight Management: Develop a combined nutrition and exercise program designed to reach desired caloric intake, while maintaining appropriate intake of nutrient and fiber, sodium and fats, and appropriate energy expenditure required for the weight goal.;Weight Management/Obesity: Establish reasonable short term and long term weight goals.;Weight Management: Provide education and appropriate resources to help participant work on and attain dietary goals.    Admit Weight  185 lb 6.5 oz (84.1 kg)    Goal Weight: Short Term  180 lb (81.6 kg)    Goal Weight: Long Term  175 lb (79.4 kg)    Expected Outcomes  Short Term: Continue to assess and modify interventions until short term weight is  achieved;Long Term: Adherence to nutrition and physical activity/exercise program aimed toward attainment of established weight goal;Weight Maintenance: Understanding of the daily nutrition guidelines, which includes 25-35% calories from fat, 7% or less cal from saturated fats, less than 200mg cholesterol, less than 1.5gm of sodium, & 5 or more servings of fruits and vegetables daily;Weight Loss: Understanding of general recommendations for a balanced deficit meal plan, which promotes 1-2 lb weight loss per week and includes a negative energy balance of 500-1000 kcal/d;Understanding recommendations for meals to include 15-35% energy as protein, 25-35% energy from fat,  35-60% energy from carbohydrates, less than 200mg of dietary cholesterol, 20-35 gm of total fiber daily;Understanding of distribution of calorie intake throughout the day with the consumption of 4-5 meals/snacks    Diabetes  Yes    Intervention  Provide education about signs/symptoms and action to take for hypo/hyperglycemia.;Provide education about proper nutrition, including hydration, and aerobic/resistive exercise prescription along with prescribed medications to achieve blood glucose in normal ranges: Fasting glucose 65-99 mg/dL    Expected Outcomes  Short Term: Participant verbalizes understanding of the signs/symptoms and immediate care of hyper/hypoglycemia, proper foot care and importance of medication, aerobic/resistive exercise and nutrition plan for blood glucose control.;Long Term: Attainment of HbA1C < 7%.    Hypertension  Yes    Intervention  Provide education on lifestyle modifcations including regular physical activity/exercise, weight management, moderate sodium restriction and increased consumption of fresh fruit, vegetables, and low fat dairy, alcohol moderation, and smoking cessation.;Monitor prescription use compliance.    Expected Outcomes  Short Term: Continued assessment and intervention until BP is < 140/90mm HG in hypertensive participants. < 130/80mm HG in hypertensive participants with diabetes, heart failure or chronic kidney disease.;Long Term: Maintenance of blood pressure at goal levels.    Lipids  Yes    Intervention  Provide education and support for participant on nutrition & aerobic/resistive exercise along with prescribed medications to achieve LDL <70mg, HDL >40mg.    Expected Outcomes  Short Term: Participant states understanding of desired cholesterol values and is compliant with medications prescribed. Participant is following exercise prescription and nutrition guidelines.;Long Term: Cholesterol controlled with medications as prescribed, with individualized  exercise RX and with personalized nutrition plan. Value goals: LDL < 70mg, HDL > 40 mg.        Personal Goals Discharge: Goals and Risk Factor Review    Row Name 12/22/17 0958 12/22/17 1003 01/17/18 1405 02/15/18 1113       Core Components/Risk Factors/Patient Goals Review   Personal Goals Review  Weight Management/Obesity;Lipids;Hypertension;Diabetes  Weight Management/Obesity;Lipids;Hypertension;Diabetes  Weight Management/Obesity;Lipids;Hypertension;Diabetes  Weight Management/Obesity;Lipids;Hypertension;Diabetes    Review  Bion has gained weight and has had some intermittent blood pressure elevations at cardiac rehab  Darus has gained weight and has had some intermittent blood pressure elevations at cardiac rehab. Rhonda's CBG's have been stable.  Sheri has gained weight and has had some intermittent blood pressure elevations at cardiac rehab. Lewin's CBG's have been stable.  Rylynn has gained weight and has had some intermittent blood pressure elevations at cardiac rehab. Jayquon's CBG's have been stable.    Expected Outcomes  Lathon will continue to watch his dietary intake and monitor his blood pressure. Dr Patwardhan's office has been notfied about weight gain and blood pressures at cardiac rehab.  Lional will continue to watch his dietary intake and monitor his blood pressure. Dr Patwardhan's office has been notfied about weight gain and blood pressures at cardiac rehab.  Tyresse will continue to watch his dietary intake and   monitor his blood pressure. Dr Bonney Rous[place] office has been notfied about weight gain and blood pressures at cardiac rehab.  Brighten will continue to watch his dietary intake and monitor his blood pressure. Dr Bonney Rous[place] office has been notfied about weight gain and blood pressures at cardiac rehab.       Exercise Goals and Review: Exercise Goals    Row Name 11/15/17 0917             Exercise Goals   Increase Physical Activity  Yes        Intervention  Provide advice, education, support and counseling about physical activity/exercise needs.;Develop an individualized exercise prescription for aerobic and resistive training based on initial evaluation findings, risk stratification, comorbidities and participant's personal goals.       Expected Outcomes  Achievement of increased cardiorespiratory fitness and enhanced flexibility, muscular endurance and strength shown through measurements of functional capacity and personal statement of participant.       Increase Strength and Stamina  Yes increase UE strength to return to yardwork and manual labor.  Learn how to walk on treadmill       Intervention  Provide advice, education, support and counseling about physical activity/exercise needs.;Develop an individualized exercise prescription for aerobic and resistive training based on initial evaluation findings, risk stratification, comorbidities and participant's personal goals.       Expected Outcomes  Achievement of increased cardiorespiratory fitness and enhanced flexibility, muscular endurance and strength shown through measurements of functional capacity and personal statement of participant.       Able to understand and use rate of perceived exertion (RPE) scale  Yes       Intervention  Provide education and explanation on how to use RPE scale       Expected Outcomes  Short Term: Able to use RPE daily in rehab to express subjective intensity level;Long Term:  Able to use RPE to guide intensity level when exercising independently       Knowledge and understanding of Target Heart Rate Range (THRR)  Yes       Intervention  Provide education and explanation of THRR including how the numbers were predicted and where they are located for reference       Expected Outcomes  Short Term: Able to state/look up THRR;Long Term: Able to use THRR to govern intensity when exercising independently;Short Term: Able to use daily as guideline for intensity in  rehab       Able to check pulse independently  Yes       Intervention  Provide education and demonstration on how to check pulse in carotid and radial arteries.;Review the importance of being able to check your own pulse for safety during independent exercise       Expected Outcomes  Short Term: Able to explain why pulse checking is important during independent exercise;Long Term: Able to check pulse independently and accurately       Understanding of Exercise Prescription  Yes       Intervention  Provide education, explanation, and written materials on patient's individual exercise prescription       Expected Outcomes  Short Term: Able to explain program exercise prescription;Long Term: Able to explain home exercise prescription to exercise independently          Nutrition & Weight - Outcomes: Pre Biometrics - 11/15/17 1201      Pre Biometrics   Height  5' 6.5" (1.689 m)    Weight  185 lb 6.5 oz (84.1 kg)  Waist Circumference  41 inches    Hip Circumference  40 inches    Waist to Hip Ratio  1.02 %    BMI (Calculated)  29.48    Triceps Skinfold  15 mm    % Body Fat  28.8 %    Grip Strength  36 kg    Flexibility  7.5 in    Single Leg Stand  30 seconds      Post Biometrics - 02/20/18 0948       Post  Biometrics   Height  5' 6.5" (1.689 m)    Weight  198 lb 13.7 oz (90.2 kg)    Waist Circumference  43 inches    Hip Circumference  41.5 inches    Waist to Hip Ratio  1.04 %    BMI (Calculated)  31.62    Triceps Skinfold  15 mm    % Body Fat  30.5 %    Grip Strength  40 kg    Flexibility  0 in    Single Leg Stand  -- single leg stand not recorded.       Nutrition: Nutrition Therapy & Goals - 11/15/17 1533      Nutrition Therapy   Diet  Carb Modified, Heart Healthy      Personal Nutrition Goals   Nutrition Goal  Pt to identify and limit food sources of saturated fat, trans fat, and sodium    Personal Goal #2  Pt to identify food quantities necessary to achieve weight  loss of 6-10 lb  at graduation from cardiac rehab.      Intervention Plan   Intervention  Prescribe, educate and counsel regarding individualized specific dietary modifications aiming towards targeted core components such as weight, hypertension, lipid management, diabetes, heart failure and other comorbidities.    Expected Outcomes  Short Term Goal: Understand basic principles of dietary content, such as calories, fat, sodium, cholesterol and nutrients.;Long Term Goal: Adherence to prescribed nutrition plan.       Nutrition Discharge: Nutrition Assessments - 03/03/18 0926      MEDFICTS Scores   Pre Score  72    Post Score  71    Score Difference  -1       Education Questionnaire Score: Knowledge Questionnaire Score - 02/20/18 0948      Knowledge Questionnaire Score   Pre Score  15/24    Post Score  19/24       Goals reviewed with patient; copy given to patient. Jaysin  graduates from cardiac rehab program on Monday with completion of 36 exercise sessions in Phase II. Pt maintained good attendance and progressed nicely during his participation in rehab as evidenced by increased MET level.   Medication list reconciled. Repeat  PHQ score-0  .  Pt has made significant lifestyle changes and should be commended for his success. Pt feels he has achieved his goals during cardiac rehab. Corie increased his distance on his post exercise walk test.  Pt plans to [place] at the [place]. Barnet Pall, RN,BSN 03/07/2018 1:41 PM

## 2018-02-17 NOTE — Telephone Encounter (Signed)
Pt came by the he had question about his medicines and referrals,pt said that he doesn't want to go to colonoscopy at this time he change his mind about the dietician. Explain to about the change of his medicine losartan to Crestor. Gave him copy of his lab results and med list

## 2018-02-20 ENCOUNTER — Telehealth: Payer: Self-pay | Admitting: Medical

## 2018-02-20 ENCOUNTER — Encounter (HOSPITAL_COMMUNITY)
Admission: RE | Admit: 2018-02-20 | Discharge: 2018-02-20 | Disposition: A | Discharge status: Home / Self Care | Payer: Medicare HMO | Source: Ambulatory Visit | Attending: Cardiology | Admitting: Cardiology

## 2018-02-20 ENCOUNTER — Other Ambulatory Visit: Payer: Self-pay | Admitting: Medical

## 2018-02-20 VITALS — BP 114/70 | HR 56 | Ht 66.5 in | Wt 198.9 lb

## 2018-02-20 DIAGNOSIS — Z952 Presence of prosthetic heart valve: Secondary | ICD-10-CM

## 2018-02-20 DIAGNOSIS — Z48812 Encounter for surgical aftercare following surgery on the circulatory system: Secondary | ICD-10-CM | POA: Diagnosis not present

## 2018-02-20 MED ORDER — ATORVASTATIN CALCIUM 20 MG PO TABS: 20.0000 mg | ORAL_TABLET | Freq: Every day | ORAL | 1 refills | Status: DC

## 2018-02-20 NOTE — Telephone Encounter (Signed)
Pt's wife, Elinor DodgeGwendolyn, came by the office stating that Crestor is too expensive so she wants to know if Vincenza HewsShane can give pt a less expensive med or give him a different dose of Lovastatin. Call wife at [phone].

## 2018-02-20 NOTE — Telephone Encounter (Signed)
lipitor sent as alternate

## 2018-02-23 NOTE — Telephone Encounter (Signed)
Wife advised. 

## 2018-04-18 ENCOUNTER — Telehealth: Payer: Self-pay | Admitting: Cardiology

## 2018-04-18 ENCOUNTER — Ambulatory Visit (INDEPENDENT_AMBULATORY_CARE_PROVIDER_SITE_OTHER): Payer: Medicare HMO | Admitting: *Deleted

## 2018-04-18 DIAGNOSIS — I442 Atrioventricular block, complete: Secondary | ICD-10-CM | POA: Diagnosis not present

## 2018-04-18 NOTE — Telephone Encounter (Signed)
Spoke with pt and reminded pt of remote transmission that is due today. Pt verbalized understanding.   

## 2018-04-18 NOTE — Progress Notes (Signed)
Remote pacemaker transmission.   

## 2018-04-19 ENCOUNTER — Encounter: Payer: Self-pay | Admitting: Cardiology

## 2018-04-21 LAB — CUP PACEART REMOTE DEVICE CHECK
Brady Statistic AP VP Percent: 8.71 %
Brady Statistic AP VS Percent: 0 %
Brady Statistic AS VP Percent: 91.26 %
Implantable Lead Location: 753860
Implantable Lead Model: 5076
Lead Channel Impedance Value: 304 Ohm
Lead Channel Impedance Value: 323 Ohm
Lead Channel Impedance Value: 475 Ohm
Lead Channel Pacing Threshold Amplitude: 0.875 V
Lead Channel Pacing Threshold Pulse Width: 0.4 ms
Lead Channel Sensing Intrinsic Amplitude: 7.625 mV
Lead Channel Sensing Intrinsic Amplitude: 7.625 mV
Lead Channel Setting Pacing Amplitude: 2 V
Lead Channel Setting Pacing Amplitude: 2.5 V
Lead Channel Setting Pacing Pulse Width: 0.5 ms
MDC IDC LEAD IMPLANT DT: 20181008
MDC IDC LEAD IMPLANT DT: 20181008
MDC IDC LEAD LOCATION: 753859
MDC IDC MSMT BATTERY REMAINING LONGEVITY: 121 mo
MDC IDC MSMT BATTERY VOLTAGE: 3.02 V
MDC IDC MSMT LEADCHNL RA PACING THRESHOLD AMPLITUDE: 1.125 V
MDC IDC MSMT LEADCHNL RA PACING THRESHOLD PULSEWIDTH: 0.4 ms
MDC IDC MSMT LEADCHNL RA SENSING INTR AMPL: 2.25 mV
MDC IDC MSMT LEADCHNL RA SENSING INTR AMPL: 2.25 mV
MDC IDC MSMT LEADCHNL RV IMPEDANCE VALUE: 437 Ohm
MDC IDC PG IMPLANT DT: 20181008
MDC IDC SESS DTM: 20190423171014
MDC IDC SET LEADCHNL RV SENSING SENSITIVITY: 2 mV
MDC IDC STAT BRADY AS VS PERCENT: 0.02 %
MDC IDC STAT BRADY RA PERCENT PACED: 8.7 %
MDC IDC STAT BRADY RV PERCENT PACED: 99.98 %

## 2018-05-25 ENCOUNTER — Telehealth: Payer: Self-pay

## 2018-05-25 ENCOUNTER — Other Ambulatory Visit: Payer: Self-pay | Admitting: Medical

## 2018-05-25 DIAGNOSIS — E118 Type 2 diabetes mellitus with unspecified complications: Secondary | ICD-10-CM

## 2018-05-25 MED ORDER — METFORMIN HCL 500 MG PO TABS
ORAL_TABLET | ORAL | 1 refills | Status: DC
Start: 2018-05-25 — End: 2018-06-18

## 2018-05-25 NOTE — Telephone Encounter (Signed)
Per fax we got from [facility] inquiring about whether patient is still taking Metformin, I have called pt and he stated he is still taking metformin and that he will be due for a refill soon. Metformin is filled at the [facility] pharmacy.

## 2018-06-15 ENCOUNTER — Ambulatory Visit (INDEPENDENT_AMBULATORY_CARE_PROVIDER_SITE_OTHER): Payer: Medicare HMO | Admitting: Medical

## 2018-06-15 ENCOUNTER — Encounter: Payer: Self-pay | Admitting: Medical

## 2018-06-15 VITALS — BP 140/90 | HR 68 | Resp 16 | Ht 68.0 in | Wt 198.0 lb

## 2018-06-15 DIAGNOSIS — Z23 Encounter for immunization: Secondary | ICD-10-CM

## 2018-06-15 DIAGNOSIS — I70213 Atherosclerosis of native arteries of extremities with intermittent claudication, bilateral legs: Secondary | ICD-10-CM | POA: Diagnosis not present

## 2018-06-15 DIAGNOSIS — I10 Essential (primary) hypertension: Secondary | ICD-10-CM

## 2018-06-15 DIAGNOSIS — R400 Somnolence: Secondary | ICD-10-CM

## 2018-06-15 DIAGNOSIS — Z952 Presence of prosthetic heart valve: Secondary | ICD-10-CM | POA: Diagnosis not present

## 2018-06-15 DIAGNOSIS — E785 Hyperlipidemia, unspecified: Secondary | ICD-10-CM | POA: Diagnosis not present

## 2018-06-15 DIAGNOSIS — Z950 Presence of cardiac pacemaker: Secondary | ICD-10-CM | POA: Diagnosis not present

## 2018-06-15 DIAGNOSIS — Z550 Illiteracy and low-level literacy: Secondary | ICD-10-CM | POA: Diagnosis not present

## 2018-06-15 DIAGNOSIS — I350 Nonrheumatic aortic (valve) stenosis: Secondary | ICD-10-CM | POA: Diagnosis not present

## 2018-06-15 DIAGNOSIS — K219 Gastro-esophageal reflux disease without esophagitis: Secondary | ICD-10-CM

## 2018-06-15 DIAGNOSIS — I442 Atrioventricular block, complete: Secondary | ICD-10-CM | POA: Diagnosis not present

## 2018-06-15 DIAGNOSIS — Z125 Encounter for screening for malignant neoplasm of prostate: Secondary | ICD-10-CM | POA: Diagnosis not present

## 2018-06-15 DIAGNOSIS — Z7185 Encounter for immunization safety counseling: Secondary | ICD-10-CM

## 2018-06-15 DIAGNOSIS — Z1211 Encounter for screening for malignant neoplasm of colon: Secondary | ICD-10-CM

## 2018-06-15 DIAGNOSIS — Z7189 Other specified counseling: Secondary | ICD-10-CM

## 2018-06-15 DIAGNOSIS — G478 Other sleep disorders: Secondary | ICD-10-CM | POA: Diagnosis not present

## 2018-06-15 DIAGNOSIS — Z0000 Encounter for general adult medical examination without abnormal findings: Secondary | ICD-10-CM

## 2018-06-15 DIAGNOSIS — I739 Peripheral vascular disease, unspecified: Secondary | ICD-10-CM | POA: Diagnosis not present

## 2018-06-15 DIAGNOSIS — I25119 Atherosclerotic heart disease of native coronary artery with unspecified angina pectoris: Secondary | ICD-10-CM | POA: Diagnosis not present

## 2018-06-15 DIAGNOSIS — E118 Type 2 diabetes mellitus with unspecified complications: Secondary | ICD-10-CM | POA: Diagnosis not present

## 2018-06-15 DIAGNOSIS — Z87891 Personal history of nicotine dependence: Secondary | ICD-10-CM | POA: Diagnosis not present

## 2018-06-15 DIAGNOSIS — E1141 Type 2 diabetes mellitus with diabetic mononeuropathy: Secondary | ICD-10-CM

## 2018-06-15 DIAGNOSIS — R9389 Abnormal findings on diagnostic imaging of other specified body structures: Secondary | ICD-10-CM

## 2018-06-15 NOTE — Patient Instructions (Addendum)
Thanks for trusting us with your health care and for coming in for a physical today.  Below are some general recommendations I have for you:  Yearly screenings See your eye doctor yearly for routine vision care. See your dentist yearly for routine dental care including hygiene visits twice yearly. See me here yearly for a routine physical and preventative care visit   Specific Concerns today:   Vaccine recommendations I recommend you call your insurance coverage about the following vaccines as you are due for these: . Shingles vaccine, 2 shots given 2 months apart . Pneumonia vaccine, 2 shots given a year apart . Tetanus booster . And I recommend yearly flu shot in September  Cancer screens  We will refer you for a colonoscopy cancer screening, Dr. Ewing SchleinMagod  We will check a PSA prostate blood test today  We will refer you for sleep study given your symptoms.  We want to check for sleep apnea.   I recommend exercising most days of the week using a type of exercise that they would enjoy and stick to such as walking, swimming, hiking, biking, etc.  I recommend a healthy diet.    Do's:   whole grains such as whole grain pasta, rice, whole grains breads and whole grain cereals.  Use small quantities such as 1/2 cup per serving or 2 slices of bread per serving.    Eat 3-5 fruits daily  Eat beans at least once daily  Eat almonds in small quantities at least 3 days per week    If they eat meat, I recommend small portions of lean meats such as chicken, fish, and [place].  Eat as much NON corn and NON potato vegetables as they like, particularly raw or steamed  Drink several large glasses of water daily  Cautions:  Limit red meat  Limit corn and potatoes  Limit sweets, cake, pie, candy  Limit beer and alcohol  Avoid fried food, fast food, large portions  Avoid sugary drinks such as regular soda and sweet tea   Please follow up yearly for a physical.

## 2018-06-15 NOTE — Addendum Note (Signed)
Addended by: Derinda LateLAMPART, Idelia Caudell G on: 06/15/2018 09:58 AM   Modules accepted: Orders

## 2018-06-15 NOTE — Addendum Note (Signed)
Addended by: Derinda LateLAMPART, Jeralynn Vaquera G on: 06/15/2018 01:32 PM   Modules accepted: Orders

## 2018-06-15 NOTE — Progress Notes (Signed)
Subjective:    Jonathan Brown is a 67 y.o. male who presents for Preventative Services visit and chronic medical problems/med check visit.    Primary Care Provider Rehaan Viloria, Kermit Balo, PA-C here for primary care  Current Health Care Team:  Dentist, Dr.  Brent General   Eye doctor, Dr. Brent General   Dr. Ewing Schlein, GI  Dr. Delrae Rend, cardiology  Dr. Lewayne Bunting, cardiothoracic surgery    Medical Services you may have received from other than [facility] providers in the past year (date may be approximate) Dr. Jacinto Halim, cardiology  Feels good, been doing well.      Exercise Current exercise habits: walking regulalry, getting "enough" exercise, lots of  Yard work.  Nutrition/Diet Current diet: good.  Mixed diet, some fried food, some junk food.  Depression Screen Depression screen [facility] 2/9 06/15/2018  Decreased Interest 2  Down, Depressed, Hopeless 0  PHQ - 2 Score 2  Altered sleeping 2  Tired, decreased energy 2  Change in appetite 0  Feeling bad or failure about yourself  0  Trouble concentrating 0  Moving slowly or fidgety/restless 0  Suicidal thoughts 0  PHQ-9 Score 6    Activities of Daily Living Screen/Functional Status Survey Is the patient deaf or have difficulty hearing?: No Does the patient have difficulty seeing, even when wearing glasses/contacts?: No Does the patient have difficulty concentrating, remembering, or making decisions?: No Does the patient have difficulty walking or climbing stairs?: No Does the patient have difficulty dressing or bathing?: No Does the patient have difficulty doing errands alone such as visiting a doctor's office or shopping?: No  Fall Risk Screen Fall Risk  06/15/2018 11/15/2017 06/14/2017  Falls in the past year? No No Yes  Risk for fall due to : - - Other (Comment)    Gait Assessment: Normal gait observed yes   Past Medical History:  Diagnosis Date  . Aortic stenosis    Dr. Jacinto Halim  . Arthritis   . Coronary artery disease 07/12/2017   Non occlusive CAD from cardiac catherization   . Diabetes mellitus without complication (HCC) 2014   type 2   . Former smoker   . GERD (gastroesophageal reflux disease)   . Hyperlipidemia   . Hypertension 2012  . PVD (peripheral vascular disease) (HCC)   . 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 AORTIC VALVE REPLACEMENT N/A 09/29/2017   Procedure: AORTIC VALVE REPLACEMENT (AVR) using 21mm Magna Ease valve;  Surgeon: Kerin Perna, MD;  Location: [facility] OR;  Service: Open Heart Surgery;  Laterality: N/A;  . CARDIAC CATHETERIZATION  07/12/2017  . COLONOSCOPY  2008  . CORONARY ARTERY BYPASS GRAFT    . LEFT AND RIGHT HEART CATHETERIZATION WITH CORONARY ANGIOGRAM N/A 04/02/2014   Procedure: LEFT AND RIGHT HEART CATHETERIZATION WITH CORONARY ANGIOGRAM;  Surgeon: Pamella Pert, MD;  Location: [facility] CATH LAB;  Service: Cardiovascular;  Laterality: N/A;  . PACEMAKER IMPLANT N/A 10/03/2017   Procedure: PACEMAKER IMPLANT;  Surgeon: Marinus Maw, MD;  Location: [facility] INVASIVE CV LAB;  Service: Cardiovascular;  Laterality: N/A;  . TEE WITHOUT CARDIOVERSION N/A 04/30/2014   Procedure: TRANSESOPHAGEAL ECHOCARDIOGRAM (TEE);  Surgeon: Pamella Pert, MD;  Location: [facility] ENDOSCOPY;  Service: Cardiovascular;  Laterality: N/A;  . TEE WITHOUT CARDIOVERSION N/A 09/29/2017   Procedure: TRANSESOPHAGEAL ECHOCARDIOGRAM (TEE);  Surgeon: Donata Clay, Theron Arista, MD;  Location: [facility] OR;  Service: Open Heart Surgery;  Laterality: N/A;    Social History   Socioeconomic History  . Marital status: Married    Spouse name: Not on file  . Number of children: Not on file  . Years of education: Not on file  . Highest education level: Not on file  Occupational History  . Not on file  Social Needs  . Financial resource strain: Not on file  . Food insecurity:    Worry: Not on file    Inability: Not on file  . Transportation needs:    Medical: Not on file     Non-medical: Not on file  Tobacco Use  . Smoking status: Former Smoker    Last attempt to quit: 09/28/2007    Years since quitting: 10.7  . Smokeless tobacco: Never Used  Substance and Sexual Activity  . Alcohol use: No  . Drug use: Yes    Frequency: 7.0 times per week    Types: Marijuana    Comment: patient says he smokes marajuana once a day  . Sexual activity: Not on file  Lifestyle  . Physical activity:    Days per week: 0 days    Minutes per session: 0 min  . Stress: Not at all  Relationships  . Social connections:    Talks on phone: Not on file    Gets together: Not on file    Attends religious service: Not on file    Active member of club or organization: Not on file    Attends meetings of clubs or organizations: Not on file    Relationship status: Not on file  . Intimate partner violence:    Fear of current or ex partner: Not on file    Emotionally abused: Not on file    Physically abused: Not on file    Forced sexual activity: Not on file  Other Topics Concern  . Not on file  Social History Narrative   Married, walks for exercise, son lives in [place], [place].   05/2018    Family History  Problem Relation Age of Onset  . Other Mother        died of old age  . Heart disease Father   . Sleep apnea Sister   . Diabetes Brother        diabetic coma x 3 years  . Alcohol abuse Brother   . Heart disease Brother 48       MI  . Cancer Neg Hx   . Stroke Neg Hx      Current Outpatient Medications:  .  acetaminophen (TYLENOL) 500 MG tablet, Take 1,000 mg by mouth daily as needed for moderate pain., Disp: , Rfl:  .  aspirin EC 81 MG tablet, Take 1 tablet (81 mg total) by mouth daily., Disp: 90 tablet, Rfl: 3 .  atorvastatin (LIPITOR) 20 MG tablet, Take 1 tablet (20 mg total) by mouth daily., Disp: 90 tablet, Rfl: 1 .  losartan (COZAAR) 100 MG tablet, Take 1 tablet (100 mg total) by mouth daily., Disp: 90 tablet, Rfl: 3 .  metFORMIN (GLUCOPHAGE) 500 MG tablet, TAKE ONE  TABLET BY MOUTH TWICE DAILY WITH  MEAL, Disp: 180 tablet, Rfl: 1 .  metoprolol tartrate (LOPRESSOR) 50 MG tablet, Take 1 tablet (50 mg total) by mouth 2 (two) times daily., Disp: 180 tablet, Rfl: 3 .  Multiple Vitamins-Minerals (MULTIVITAMIN WITH MINERALS) tablet, Take 1 tablet by mouth daily., Disp: , Rfl:  .  omeprazole (PRILOSEC) 40 MG capsule, Take 1 capsule (40 mg total) by  mouth daily., Disp: 90 capsule, Rfl: 3 .  Tetrahydrozoline HCl (VISINE OP), Apply 1 drop to eye daily as needed (dry eyes)., Disp: , Rfl:   No Known Allergies  History reviewed: allergies, current medications, past family history, past medical history, past social history, past surgical history and problem list  Chronic issues discussed: Diabetes - checking sugars, getting 120-130s in morning.    Heart disesae - s/p aortic valve replacemenbt 2018.    Acute issues discussed: Has a tendency to doze off in the day.  No snoring.  Wife reports no apnea.  soemtimes doesn't feel rested.  Has some pain along scar line from open heart surgery.  Had open heart surgery 08/2017 for valves.  Objective:     Biometrics BP 140/90   Pulse 68   Resp 16   Ht 5\' 8"  (1.727 m)   Wt 198 lb (89.8 kg)   SpO2 98%   BMI 30.11 kg/m   Wt Readings from Last 3 Encounters:  06/15/18 198 lb (89.8 kg)  02/20/18 198 lb 13.7 oz (90.2 kg)  02/14/18 197 lb 6.4 oz (89.5 kg)   BP Readings from Last 3 Encounters:  06/15/18 140/90  02/20/18 114/70  02/14/18 136/82    Gen: wd, wn, nad, AA male Skin: vertical sternal scar with keloid and left upper chest pacemaker surgery scar with keloid HEENT: normocephalic, sclerae anicteric, TMs pearly, nares patent, no discharge or erythema, pharynx normal Oral cavity: MMM, no lesions Neck: supple, no lymphadenopathy, no thyromegaly, no masses, no bruits Heart: 2/6 holosystolic murmur heart thorughotu, otherwise RRR, normal S1, S2 Lungs: CTA bilaterally, no wheezes, rhonchi, or rales Abdomen: +bs,  soft, non tender, non distended, no masses, no hepatomegaly, no splenomegaly Musculoskeletal: nontender, no swelling, no obvious deformity Extremities: patchy brownish coloration changes of lower legs c/w venous stasis disease, no edema, no cyanosis, no clubbing Pulses: 1+ symmetric, upper and lower extremities, normal cap refill Neurological: alert, oriented x 3, CN2-12 intact, strength normal upper extremities and lower extremities, sensation normal throughout, DTRs 2+ throughout, no cerebellar signs, gait normal Psychiatric: normal affect, behavior normal, pleasant  GU and rectal - declined  Diabetic Foot Exam - Simple   Simple Foot Form Diabetic Foot exam was performed with the following findings:  Yes 06/15/2018  9:20 AM  Visual Inspection See comments:  Yes Sensation Testing Intact to touch and monofilament testing bilaterally:  Yes Pulse Check See comments:  Yes Comments 1+ pedal pulses, hammer to deformity bilat feet, toes 2-4 bilat, mildly thickened toenails, no other foot lesoins      Assessment:   Encounter Diagnoses  Name Primary?  . Type 2 diabetes mellitus with complication, without long-term current use of insulin (HCC) Yes  . Medicare annual wellness visit, subsequent   . Essential hypertension   . Coronary artery disease involving native coronary artery of native heart with angina pectoris (HCC)   . Atherosclerosis of native artery of both lower extremities with intermittent claudication (HCC)   . Aortic valve stenosis, etiology of cardiac valve disease unspecified   . Gastroesophageal reflux disease without esophagitis   . CHB (complete heart block) (HCC)   . Hyperlipidemia, unspecified hyperlipidemia type   . S/P AVR (aortic valve replacement)   . S/P placement of cardiac pacemaker   . Former smoker   . 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 Vaccine counseling   . Abnormal chest CT   . 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 Screen  for colon cancer   . Screening for prostate cancer   . Daytime somnolence   . Non-restorative sleep   . PVD (peripheral vascular disease) (HCC)   . Need for pneumococcal vaccination      Plan:   A preventative services visit was completed today.  During the course of the visit today, we discussed and counseled about appropriate screening and preventive services.  A health risk assessment was established today that included a review of current medications, allergies, social history, family history, medical and preventative health history, biometrics, and preventative screenings to identify potential safety concerns or impairments.  A personalized plan was printed today for your records and use.   Personalized health advice and education was given today to reduce health risks and promote self management and wellness.  Information regarding end of life planning was discussed today.  Conditions/risks identified: Illiterate  Counseled on the pneumococcal vaccine.  Vaccine information sheet given.  Pneumococcal vaccine Prevnar 13 given after consent obtained.   Patient Instructions  Thanks for trusting [place] with your health care and for coming in for a physical today.  Below are some general recommendations I have for you:  Yearly screenings See your eye doctor yearly for routine vision care. See your dentist yearly for routine dental care including hygiene visits twice yearly. See me here yearly for a routine physical and preventative care visit   Specific Concerns today:   Vaccine recommendations I recommend you call your insurance coverage about the following vaccines as you are due for these: . Shingles vaccine, 2 shots given 2 months apart . Pneumonia vaccine, 2 shots given a year apart . Tetanus booster . And I recommend yearly flu shot in September  Cancer screens  We will refer you for a colonoscopy cancer screening, Dr. Ewing Schlein  We will check a PSA prostate blood test  today  We will refer you for sleep study given your symptoms.  We want to check for sleep apnea.   I recommend exercising most days of the week using a type of exercise that they would enjoy and stick to such as walking, swimming, hiking, biking, etc.  I recommend a healthy diet.    Do's:   whole grains such as whole grain pasta, rice, whole grains breads and whole grain cereals.  Use small quantities such as 1/2 cup per serving or 2 slices of bread per serving.    Eat 3-5 fruits daily  Eat beans at least once daily  Eat almonds in small quantities at least 3 days per week    If they eat meat, I recommend small portions of lean meats such as chicken, fish, and [place].  Eat as much NON corn and NON potato vegetables as they like, particularly raw or steamed  Drink several large glasses of water daily  Cautions:  Limit red meat  Limit corn and potatoes  Limit sweets, cake, pie, candy  Limit beer and alcohol  Avoid fried food, fast food, large portions  Avoid sugary drinks such as regular soda and sweet tea   Please follow up yearly for a physical.   Referrals today Gastro for colonosocpy Sleep study  Medicare Attestation  A preventative services visit was completed today.  During the course of the visit the patient was educated and counseled about appropriate screening and preventive services.  A health risk assessment was established with the patient that included a review of current medications, allergies, social history, family history, medical and preventative health history, biometrics,  and preventative screenings to identify potential safety concerns or impairments.  A personalized plan was printed today for the patient's records and use.   Personalized health advice and education was given today to reduce health risks and promote self management and wellness.  Information regarding end of life planning was discussed today.  Kristian Covey, PA-C   06/15/2018

## 2018-06-16 LAB — COMPREHENSIVE METABOLIC PANEL
ALBUMIN: 4.4 g/dL (ref 3.6–4.8)
ALT: 36 IU/L (ref 0–44)
AST: 25 IU/L (ref 0–40)
Albumin/Globulin Ratio: 1.8 (ref 1.2–2.2)
Alkaline Phosphatase: 95 IU/L (ref 39–117)
BUN / CREAT RATIO: 13 (ref 10–24)
BUN: 13 mg/dL (ref 8–27)
Bilirubin Total: 0.5 mg/dL (ref 0.0–1.2)
CALCIUM: 10.1 mg/dL (ref 8.6–10.2)
CO2: 21 mmol/L (ref 20–29)
CREATININE: 0.97 mg/dL (ref 0.76–1.27)
Chloride: 107 mmol/L — ABNORMAL HIGH (ref 96–106)
GFR calc Af Amer: 94 mL/min/{1.73_m2} (ref >59)
GFR, EST NON AFRICAN AMERICAN: 81 mL/min/{1.73_m2} (ref >59)
Globulin, Total: 2.5 g/dL (ref 1.5–4.5)
Glucose: 117 mg/dL — ABNORMAL HIGH (ref 65–99)
Potassium: 4.2 mmol/L (ref 3.5–5.2)
Sodium: 143 mmol/L (ref 134–144)
Total Protein: 6.9 g/dL (ref 6.0–8.5)

## 2018-06-16 LAB — LIPID PANEL
CHOL/HDL RATIO: 4.4 ratio (ref 0.0–5.0)
Cholesterol, Total: 146 mg/dL (ref 100–199)
HDL: 33 mg/dL — ABNORMAL LOW (ref >39)
LDL Calculated: 80 mg/dL (ref 0–99)
Triglycerides: 164 mg/dL — ABNORMAL HIGH (ref 0–149)
VLDL Cholesterol Cal: 33 mg/dL (ref 5–40)

## 2018-06-16 LAB — HEMOGLOBIN A1C
ESTIMATED AVERAGE GLUCOSE: 120 mg/dL
HEMOGLOBIN A1C: 5.8 % — AB (ref 4.8–5.6)

## 2018-06-16 LAB — PSA: PROSTATE SPECIFIC AG, SERUM: 2.1 ng/mL (ref 0.0–4.0)

## 2018-06-18 ENCOUNTER — Other Ambulatory Visit: Payer: Self-pay | Admitting: Medical

## 2018-06-18 DIAGNOSIS — E118 Type 2 diabetes mellitus with unspecified complications: Secondary | ICD-10-CM

## 2018-06-18 MED ORDER — METFORMIN HCL 500 MG PO TABS: ORAL_TABLET | ORAL | 1 refills | Status: DC

## 2018-06-18 MED ORDER — ROSUVASTATIN CALCIUM 20 MG PO TABS: 20.0000 mg | ORAL_TABLET | Freq: Every day | ORAL | 3 refills | Status: DC

## 2018-06-19 ENCOUNTER — Other Ambulatory Visit: Payer: Self-pay | Admitting: Medical

## 2018-06-20 ENCOUNTER — Other Ambulatory Visit: Payer: Self-pay

## 2018-06-27 ENCOUNTER — Encounter: Payer: Self-pay | Admitting: Internal Medicine

## 2018-07-18 ENCOUNTER — Ambulatory Visit (INDEPENDENT_AMBULATORY_CARE_PROVIDER_SITE_OTHER): Payer: Medicare HMO | Admitting: *Deleted

## 2018-07-18 DIAGNOSIS — I442 Atrioventricular block, complete: Secondary | ICD-10-CM

## 2018-07-18 NOTE — Progress Notes (Signed)
Remote pacemaker transmission.   

## 2018-08-12 LAB — CUP PACEART REMOTE DEVICE CHECK
Battery Remaining Longevity: 118 mo
Battery Voltage: 3.01 V
Brady Statistic AP VP Percent: 9.8 %
Brady Statistic AP VS Percent: 0 %
Brady Statistic RA Percent Paced: 9.8 %
Brady Statistic RV Percent Paced: 99.98 %
Implantable Lead Implant Date: 20181008
Implantable Lead Implant Date: 20181008
Implantable Lead Location: 753859
Implantable Lead Location: 753860
Implantable Lead Model: 3830
Implantable Pulse Generator Implant Date: 20181008
Lead Channel Impedance Value: 304 Ohm
Lead Channel Impedance Value: 437 Ohm
Lead Channel Impedance Value: 456 Ohm
Lead Channel Pacing Threshold Amplitude: 1 V
Lead Channel Pacing Threshold Pulse Width: 0.4 ms
Lead Channel Sensing Intrinsic Amplitude: 2.375 mV
Lead Channel Sensing Intrinsic Amplitude: 2.375 mV
Lead Channel Sensing Intrinsic Amplitude: 4.5 mV
Lead Channel Setting Pacing Amplitude: 2 V
Lead Channel Setting Pacing Amplitude: 2.5 V
Lead Channel Setting Sensing Sensitivity: 2 mV
MDC IDC MSMT LEADCHNL RA IMPEDANCE VALUE: 304 Ohm
MDC IDC MSMT LEADCHNL RA PACING THRESHOLD AMPLITUDE: 1.25 V
MDC IDC MSMT LEADCHNL RV PACING THRESHOLD PULSEWIDTH: 0.4 ms
MDC IDC MSMT LEADCHNL RV SENSING INTR AMPL: 4.5 mV
MDC IDC SESS DTM: 20190723134738
MDC IDC SET LEADCHNL RV PACING PULSEWIDTH: 0.5 ms
MDC IDC STAT BRADY AS VP PERCENT: 90.18 %
MDC IDC STAT BRADY AS VS PERCENT: 0.01 %

## 2018-08-17 ENCOUNTER — Encounter: Payer: Self-pay | Admitting: Medical

## 2018-09-09 DIAGNOSIS — E669 Obesity, unspecified: Secondary | ICD-10-CM | POA: Diagnosis not present

## 2018-09-09 DIAGNOSIS — J309 Allergic rhinitis, unspecified: Secondary | ICD-10-CM | POA: Diagnosis not present

## 2018-09-09 DIAGNOSIS — E1136 Type 2 diabetes mellitus with diabetic cataract: Secondary | ICD-10-CM | POA: Diagnosis not present

## 2018-09-09 DIAGNOSIS — I2510 Atherosclerotic heart disease of native coronary artery without angina pectoris: Secondary | ICD-10-CM | POA: Diagnosis not present

## 2018-09-09 DIAGNOSIS — E785 Hyperlipidemia, unspecified: Secondary | ICD-10-CM | POA: Diagnosis not present

## 2018-09-09 DIAGNOSIS — G3184 Mild cognitive impairment, so stated: Secondary | ICD-10-CM | POA: Diagnosis not present

## 2018-09-09 DIAGNOSIS — E1140 Type 2 diabetes mellitus with diabetic neuropathy, unspecified: Secondary | ICD-10-CM | POA: Diagnosis not present

## 2018-09-09 DIAGNOSIS — E1165 Type 2 diabetes mellitus with hyperglycemia: Secondary | ICD-10-CM | POA: Diagnosis not present

## 2018-09-09 DIAGNOSIS — H04129 Dry eye syndrome of unspecified lacrimal gland: Secondary | ICD-10-CM | POA: Diagnosis not present

## 2018-09-09 DIAGNOSIS — I10 Essential (primary) hypertension: Secondary | ICD-10-CM | POA: Diagnosis not present

## 2018-10-17 ENCOUNTER — Telehealth: Payer: Self-pay | Admitting: Cardiology

## 2018-10-17 ENCOUNTER — Ambulatory Visit (INDEPENDENT_AMBULATORY_CARE_PROVIDER_SITE_OTHER): Payer: Medicare HMO | Admitting: *Deleted

## 2018-10-17 DIAGNOSIS — I442 Atrioventricular block, complete: Secondary | ICD-10-CM | POA: Diagnosis not present

## 2018-10-17 NOTE — Telephone Encounter (Signed)
LMOVM reminding pt to send remote transmission.   

## 2018-10-17 NOTE — Progress Notes (Signed)
Remote pacemaker transmission.   

## 2018-10-18 ENCOUNTER — Encounter: Payer: Self-pay | Admitting: Cardiology

## 2018-10-19 ENCOUNTER — Encounter: Payer: Self-pay | Admitting: Medical

## 2018-10-19 ENCOUNTER — Ambulatory Visit (INDEPENDENT_AMBULATORY_CARE_PROVIDER_SITE_OTHER): Payer: Medicare HMO | Admitting: Medical

## 2018-10-19 VITALS — BP 124/80 | HR 68 | Temp 98.0°F | Resp 16 | Ht 69.0 in | Wt 201.0 lb

## 2018-10-19 DIAGNOSIS — I70213 Atherosclerosis of native arteries of extremities with intermittent claudication, bilateral legs: Secondary | ICD-10-CM | POA: Diagnosis not present

## 2018-10-19 DIAGNOSIS — I25119 Atherosclerotic heart disease of native coronary artery with unspecified angina pectoris: Secondary | ICD-10-CM | POA: Diagnosis not present

## 2018-10-19 DIAGNOSIS — E785 Hyperlipidemia, unspecified: Secondary | ICD-10-CM

## 2018-10-19 DIAGNOSIS — I10 Essential (primary) hypertension: Secondary | ICD-10-CM | POA: Diagnosis not present

## 2018-10-19 DIAGNOSIS — Z23 Encounter for immunization: Secondary | ICD-10-CM | POA: Diagnosis not present

## 2018-10-19 DIAGNOSIS — E118 Type 2 diabetes mellitus with unspecified complications: Secondary | ICD-10-CM | POA: Diagnosis not present

## 2018-10-19 DIAGNOSIS — R69 Illness, unspecified: Secondary | ICD-10-CM | POA: Diagnosis not present

## 2018-10-19 DIAGNOSIS — Z7185 Encounter for immunization safety counseling: Secondary | ICD-10-CM

## 2018-10-19 DIAGNOSIS — E1141 Type 2 diabetes mellitus with diabetic mononeuropathy: Secondary | ICD-10-CM | POA: Diagnosis not present

## 2018-10-19 DIAGNOSIS — Z550 Illiteracy and low-level literacy: Secondary | ICD-10-CM

## 2018-10-19 DIAGNOSIS — Z7189 Other specified counseling: Secondary | ICD-10-CM | POA: Diagnosis not present

## 2018-10-19 DIAGNOSIS — I739 Peripheral vascular disease, unspecified: Secondary | ICD-10-CM

## 2018-10-19 LAB — POCT GLYCOSYLATED HEMOGLOBIN (HGB A1C): Hemoglobin A1C: 6.1 % — AB (ref 4.0–5.6)

## 2018-10-19 MED ORDER — ONETOUCH DELICA LANCETS FINE MISC: 1.0000 | Freq: Two times a day (BID) | 11 refills | Status: AC

## 2018-10-19 MED ORDER — OLMESARTAN MEDOXOMIL 40 MG PO TABS: 40.0000 mg | ORAL_TABLET | Freq: Every day | ORAL | 3 refills | Status: DC

## 2018-10-19 NOTE — Progress Notes (Signed)
Subjective: Chief Complaint  Patient presents with  . discuss meds    discuss meds    Here to get flu shot  Needs refill on strips and lancets  Got recall notice about losartan.  Needs this changed.    Otherwise doing ok, compliant with medication.      Past Medical History:  Diagnosis Date  . Aortic stenosis    Dr. Jacinto Halim  . Arthritis   . Coronary artery disease 07/12/2017   Non occlusive CAD from cardiac catherization   . Diabetes mellitus without complication (HCC) 2014   type 2   . Former smoker   . GERD (gastroesophageal reflux disease)   . Hyperlipidemia   . Hypertension 2012  . PVD (peripheral vascular disease) (HCC)   . 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 acetaminophen (TYLENOL) 500 MG tablet Take 1,000 mg by mouth daily as needed for moderate pain.    Marland Kitchen aspirin EC 81 MG tablet Take 1 tablet (81 mg total) by mouth daily. 90 tablet 3  . glucose blood test strip 100 each by Other route daily. Use as instructed   Dispense 100 box   12 refills     . metFORMIN (GLUCOPHAGE) 500 MG tablet TAKE ONE TABLET BY MOUTH TWICE DAILY WITH  MEAL 180 tablet 1  . metoprolol tartrate (LOPRESSOR) 50 MG tablet Take 1 tablet (50 mg total) by mouth 2 (two) times daily. 180 tablet 3  . Multiple Vitamins-Minerals (MULTIVITAMIN WITH MINERALS) tablet Take 1 tablet by mouth daily.    Marland Kitchen omeprazole (PRILOSEC) 40 MG capsule Take 1 capsule (40 mg total) by mouth daily. 90 capsule 3  . rosuvastatin (CRESTOR) 20 MG tablet Take 1 tablet (20 mg total) by mouth at bedtime. 90 tablet 3  . Tetrahydrozoline HCl (VISINE OP) Apply 1 drop to eye daily as needed (dry eyes).     No current facility-administered medications on file prior to visit.    ROS as in subjective   Objective: BP 124/80   Pulse 68   Temp 98 F (36.7 C) (Oral)   Resp 16   Ht 5\' 9"  (1.753 m)   Wt 201 lb (91.2 kg)   SpO2  97%   BMI 29.68 kg/m   Wt Readings from Last 3 Encounters:  10/19/18 201 lb (91.2 kg)  06/15/18 198 lb (89.8 kg)  02/20/18 198 lb 13.7 oz (90.2 kg)   BP Readings from Last 3 Encounters:  10/19/18 124/80  06/15/18 140/90  02/20/18 114/70    Gen: wd, wn, nad  Skin unremarkable Lungs clear Heart with 2/6 holosystolic murmur heard in upper sternal borders, otherwise normal S1-S2 Extremities without edema Pulses 1-2+ throughout     Assessment: Encounter Diagnoses  Name Primary?  . Type 2 diabetes mellitus with complication, without long-term current use of insulin (HCC) Yes  . Need for influenza vaccination   . Essential hypertension   . Hyperlipidemia, unspecified hyperlipidemia type   . 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 PVD (peripheral vascular disease) (HCC)   . Coronary artery disease involving native coronary artery of native heart with angina pectoris (HCC)   . Atherosclerosis of native artery of both lower extremities with intermittent claudication (HCC)   . 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 Vaccine counseling       Plan: Diabetes -  Hgba1C 6.1% today.  Gave script for lancets and strips, one touch delica pulse 33 g lancets #100, one touch verio strips, #100.   HTN - stop losartan due to recall, change to olmesartan.  Counseled on the influenza virus vaccine.  Vaccine information sheet given.  Influenza vaccine given after consent obtained.  Patient Instructions  Recommendations:  Shingles vaccine:  I recommend you have a shingles vaccine to help prevent shingles or herpes zoster outbreak.   Please call your insurer to inquire about coverage for the Shingrix vaccine given in 2 doses.   Some insurers cover this vaccine after age 47, some cover this after age 43.  If your insurer covers this, then call to schedule appointment to have this vaccine here.  I changed your Losartan to Olmesartan 40mg  daily.  If this is not covered by  insurance, let me know  I wrote a prescription for your test strips and lancets today  All of your medications have active 90 day supply refills   I recommend exercising most days of the week using a type of exercise that they would enjoy and stick to such as walking, running, swimming, hiking, biking, aerobics, etc.  I recommend a healthy diet.    Do's:   whole grains such as whole grain pasta, rice, whole grains breads and whole grain cereals.  Use small quantities such as 1/2 cup per serving or 2 slices of bread per serving.    Eat 3-5 fruits daily  Eat beans at least once daily  Eat almonds in small quantities at least 3 days per week    If they eat meat, I recommend small portions of lean meats such as chicken, fish, and [place].  Eat as much NON corn and NON potato vegetables as they like, particularly raw or steamed  Drink several large glasses of water daily  Cautions:  Limit red meat  Limit corn and potatoes  Limit sweets, cake, pie, candy  Limit beer and alcohol  Avoid fried food, fast food, large portions  Avoid sugary drinks such as regular soda and sweet tea      Torris was seen today for discuss meds.  Diagnoses and all orders for this visit:  Type 2 diabetes mellitus with complication, without long-term current use of insulin (HCC) -     HgB A1c  Need for influenza vaccination -     Flu vaccine HIGH DOSE PF (Fluzone High Dose)  Essential hypertension  Hyperlipidemia, unspecified hyperlipidemia type  Diabetic mononeuropathy associated with type 2 diabetes mellitus (HCC)  PVD (peripheral vascular disease) (HCC)  Coronary artery disease involving native coronary artery of native heart with angina pectoris (HCC)  Atherosclerosis of native artery of both lower extremities with intermittent claudication (HCC)  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 -     ONETOUCH DELICA LANCETS FINE MISC; 1 each by Does not apply route 2 (two) times daily for 3 doses.

## 2018-10-19 NOTE — Patient Instructions (Signed)
Recommendations:  Shingles vaccine:  I recommend you have a shingles vaccine to help prevent shingles or herpes zoster outbreak.   Please call your insurer to inquire about coverage for the Shingrix vaccine given in 2 doses.   Some insurers cover this vaccine after age 67, some cover this after age 110.  If your insurer covers this, then call to schedule appointment to have this vaccine here.  I changed your Losartan to Olmesartan 40mg  daily.  If this is not covered by insurance, let me know  I wrote a prescription for your test strips and lancets today  All of your medications have active 90 day supply refills   I recommend exercising most days of the week using a type of exercise that they would enjoy and stick to such as walking, running, swimming, hiking, biking, aerobics, etc.  I recommend a healthy diet.    Do's:   whole grains such as whole grain pasta, rice, whole grains breads and whole grain cereals.  Use small quantities such as 1/2 cup per serving or 2 slices of bread per serving.    Eat 3-5 fruits daily  Eat beans at least once daily  Eat almonds in small quantities at least 3 days per week    If they eat meat, I recommend small portions of lean meats such as chicken, fish, and [place].  Eat as much NON corn and NON potato vegetables as they like, particularly raw or steamed  Drink several large glasses of water daily  Cautions:  Limit red meat  Limit corn and potatoes  Limit sweets, cake, pie, candy  Limit beer and alcohol  Avoid fried food, fast food, large portions  Avoid sugary drinks such as regular soda and sweet tea

## 2018-11-08 LAB — CUP PACEART REMOTE DEVICE CHECK
Battery Voltage: 3 V
Brady Statistic AS VS Percent: 0.01 %
Brady Statistic RA Percent Paced: 11.77 %
Date Time Interrogation Session: 20191022181444
Implantable Lead Location: 753859
Implantable Lead Model: 3830
Implantable Lead Model: 5076
Implantable Pulse Generator Implant Date: 20181008
Lead Channel Impedance Value: 304 Ohm
Lead Channel Impedance Value: 323 Ohm
Lead Channel Pacing Threshold Amplitude: 1 V
Lead Channel Pacing Threshold Pulse Width: 0.4 ms
Lead Channel Sensing Intrinsic Amplitude: 2.625 mV
Lead Channel Setting Pacing Amplitude: 2 V
MDC IDC LEAD IMPLANT DT: 20181008
MDC IDC LEAD IMPLANT DT: 20181008
MDC IDC LEAD LOCATION: 753860
MDC IDC MSMT BATTERY REMAINING LONGEVITY: 115 mo
MDC IDC MSMT LEADCHNL RA IMPEDANCE VALUE: 437 Ohm
MDC IDC MSMT LEADCHNL RA PACING THRESHOLD PULSEWIDTH: 0.4 ms
MDC IDC MSMT LEADCHNL RA SENSING INTR AMPL: 2.625 mV
MDC IDC MSMT LEADCHNL RV IMPEDANCE VALUE: 437 Ohm
MDC IDC MSMT LEADCHNL RV PACING THRESHOLD AMPLITUDE: 1.25 V
MDC IDC MSMT LEADCHNL RV SENSING INTR AMPL: 4.5 mV
MDC IDC MSMT LEADCHNL RV SENSING INTR AMPL: 4.5 mV
MDC IDC SET LEADCHNL RV PACING AMPLITUDE: 2.5 V
MDC IDC SET LEADCHNL RV PACING PULSEWIDTH: 0.5 ms
MDC IDC SET LEADCHNL RV SENSING SENSITIVITY: 2 mV
MDC IDC STAT BRADY AP VP PERCENT: 11.78 %
MDC IDC STAT BRADY AP VS PERCENT: 0 %
MDC IDC STAT BRADY AS VP PERCENT: 88.21 %
MDC IDC STAT BRADY RV PERCENT PACED: 99.99 %

## 2018-12-11 ENCOUNTER — Telehealth: Payer: Self-pay | Admitting: Medical

## 2018-12-11 ENCOUNTER — Other Ambulatory Visit: Payer: Self-pay | Admitting: Medical

## 2018-12-11 MED ORDER — IRBESARTAN 150 MG PO TABS: 150.0000 mg | ORAL_TABLET | Freq: Every day | ORAL | 2 refills | Status: DC

## 2018-12-11 NOTE — Telephone Encounter (Signed)
I sent Irbesartan to replace Olmesartan

## 2018-12-11 NOTE — Telephone Encounter (Signed)
Pt called and states that his Olmesartan is over $100 dollars he needs something cheaper pt uses [facility] [address]- [place], [place] - [address] pt would like a call back after this is done pt can be reached at [phone]

## 2018-12-12 NOTE — Telephone Encounter (Signed)
Patient has been informed.

## 2019-01-16 ENCOUNTER — Ambulatory Visit (INDEPENDENT_AMBULATORY_CARE_PROVIDER_SITE_OTHER): Payer: Medicare HMO

## 2019-01-16 DIAGNOSIS — I442 Atrioventricular block, complete: Secondary | ICD-10-CM

## 2019-01-18 LAB — CUP PACEART REMOTE DEVICE CHECK
Battery Voltage: 3 V
Brady Statistic AP VP Percent: 12.2 %
Brady Statistic AP VS Percent: 0 %
Brady Statistic AS VS Percent: 0.03 %
Implantable Lead Implant Date: 20181008
Implantable Lead Implant Date: 20181008
Implantable Lead Location: 753859
Implantable Lead Location: 753860
Implantable Lead Model: 3830
Implantable Lead Model: 5076
Lead Channel Impedance Value: 304 Ohm
Lead Channel Impedance Value: 304 Ohm
Lead Channel Impedance Value: 513 Ohm
Lead Channel Pacing Threshold Amplitude: 1 V
Lead Channel Pacing Threshold Amplitude: 1.125 V
Lead Channel Pacing Threshold Pulse Width: 0.4 ms
Lead Channel Sensing Intrinsic Amplitude: 2.875 mV
Lead Channel Sensing Intrinsic Amplitude: 2.875 mV
Lead Channel Setting Pacing Amplitude: 2.5 V
MDC IDC MSMT BATTERY REMAINING LONGEVITY: 110 mo
MDC IDC MSMT LEADCHNL RA PACING THRESHOLD PULSEWIDTH: 0.4 ms
MDC IDC MSMT LEADCHNL RV IMPEDANCE VALUE: 418 Ohm
MDC IDC MSMT LEADCHNL RV SENSING INTR AMPL: 4.5 mV
MDC IDC MSMT LEADCHNL RV SENSING INTR AMPL: 4.5 mV
MDC IDC PG IMPLANT DT: 20181008
MDC IDC SESS DTM: 20200122053542
MDC IDC SET LEADCHNL RA PACING AMPLITUDE: 2 V
MDC IDC SET LEADCHNL RV PACING PULSEWIDTH: 0.5 ms
MDC IDC SET LEADCHNL RV SENSING SENSITIVITY: 2 mV
MDC IDC STAT BRADY AS VP PERCENT: 87.77 %
MDC IDC STAT BRADY RA PERCENT PACED: 12.19 %
MDC IDC STAT BRADY RV PERCENT PACED: 99.97 %

## 2019-01-19 ENCOUNTER — Encounter: Payer: Self-pay | Admitting: Cardiology

## 2019-01-19 DIAGNOSIS — R69 Illness, unspecified: Secondary | ICD-10-CM | POA: Diagnosis not present

## 2019-01-19 NOTE — Progress Notes (Signed)
Letter  

## 2019-01-19 NOTE — Progress Notes (Signed)
Remote pacemaker transmission.   

## 2019-01-31 ENCOUNTER — Ambulatory Visit: Payer: Medicare HMO | Admitting: Cardiology

## 2019-01-31 ENCOUNTER — Encounter: Payer: Self-pay | Admitting: Cardiology

## 2019-01-31 VITALS — BP 179/100 | HR 64 | Ht 69.0 in | Wt 202.6 lb

## 2019-01-31 DIAGNOSIS — I2510 Atherosclerotic heart disease of native coronary artery without angina pectoris: Secondary | ICD-10-CM | POA: Diagnosis not present

## 2019-01-31 DIAGNOSIS — Z950 Presence of cardiac pacemaker: Secondary | ICD-10-CM

## 2019-01-31 DIAGNOSIS — I10 Essential (primary) hypertension: Secondary | ICD-10-CM

## 2019-01-31 DIAGNOSIS — Z952 Presence of prosthetic heart valve: Secondary | ICD-10-CM | POA: Diagnosis not present

## 2019-01-31 DIAGNOSIS — I25119 Atherosclerotic heart disease of native coronary artery with unspecified angina pectoris: Secondary | ICD-10-CM

## 2019-01-31 MED ORDER — AMLODIPINE BESYLATE 10 MG PO TABS: 10.0000 mg | ORAL_TABLET | Freq: Every day | ORAL | 2 refills | Status: DC

## 2019-01-31 NOTE — Patient Instructions (Signed)
DASH Eating Plan  DASH stands for "Dietary Approaches to Stop Hypertension." The DASH eating plan is a healthy eating plan that has been shown to reduce high blood pressure (hypertension). It may also reduce your risk for type 2 diabetes, heart disease, and stroke. The DASH eating plan may also help with weight loss.  What are tips for following this plan?    General guidelines  · Avoid eating more than 2,300 mg (milligrams) of salt (sodium) a day. If you have hypertension, you may need to reduce your sodium intake to 1,500 mg a day.  · Limit alcohol intake to no more than 1 drink a day for nonpregnant women and 2 drinks a day for men. One drink equals 12 oz of beer, 5 oz of wine, or 1½ oz of hard liquor.  · Work with your health care provider to maintain a healthy body weight or to lose weight. Ask what an ideal weight is for you.  · Get at least 30 minutes of exercise that causes your heart to beat faster (aerobic exercise) most days of the week. Activities may include walking, swimming, or biking.  · Work with your health care provider or diet and nutrition specialist (dietitian) to adjust your eating plan to your individual calorie needs.  Reading food labels    · Check food labels for the amount of sodium per serving. Choose foods with less than 5 percent of the Daily Value of sodium. Generally, foods with less than 300 mg of sodium per serving fit into this eating plan.  · To find whole grains, look for the word "whole" as the first word in the ingredient list.  Shopping  · Buy products labeled as "low-sodium" or "no salt added."  · Buy fresh foods. Avoid canned foods and premade or frozen meals.  Cooking  · Avoid adding salt when cooking. Use salt-free seasonings or herbs instead of table salt or sea salt. Check with your health care provider or pharmacist before using salt substitutes.  · Do not fry foods. Cook foods using healthy methods such as baking, boiling, grilling, and broiling instead.  · Cook with  heart-healthy oils, such as olive, canola, soybean, or sunflower oil.  Meal planning  · Eat a balanced diet that includes:  ? 5 or more servings of fruits and vegetables each day. At each meal, try to fill half of your plate with fruits and vegetables.  ? Up to 6-8 servings of whole grains each day.  ? Less than 6 oz of lean meat, poultry, or fish each day. A 3-oz serving of meat is about the same size as a deck of cards. One egg equals 1 oz.  ? 2 servings of low-fat dairy each day.  ? A serving of nuts, seeds, or beans 5 times each week.  ? Heart-healthy fats. Healthy fats called Omega-3 fatty acids are found in foods such as flaxseeds and coldwater fish, like sardines, salmon, and mackerel.  · Limit how much you eat of the following:  ? Canned or prepackaged foods.  ? Food that is high in trans fat, such as fried foods.  ? Food that is high in saturated fat, such as fatty meat.  ? Sweets, desserts, sugary drinks, and other foods with added sugar.  ? Full-fat dairy products.  · Do not salt foods before eating.  · Try to eat at least 2 vegetarian meals each week.  · Eat more home-cooked food and less restaurant, buffet, and fast food.  ·   When eating at a restaurant, ask that your food be prepared with less salt or no salt, if possible.  What foods are recommended?  The items listed may not be a complete list. Talk with your dietitian about what dietary choices are best for you.  Grains  Whole-grain or whole-wheat bread. Whole-grain or whole-wheat pasta. Brown rice. Oatmeal. Quinoa. Bulgur. Whole-grain and low-sodium cereals. Pita bread. Low-fat, low-sodium crackers. Whole-wheat flour tortillas.  Vegetables  Fresh or frozen vegetables (raw, steamed, roasted, or grilled). Low-sodium or reduced-sodium tomato and vegetable juice. Low-sodium or reduced-sodium tomato sauce and tomato paste. Low-sodium or reduced-sodium canned vegetables.  Fruits  All fresh, dried, or frozen fruit. Canned fruit in natural juice (without  added sugar).  Meat and other protein foods  Skinless chicken or turkey. Ground chicken or turkey. Pork with fat trimmed off. Fish and seafood. Egg whites. Dried beans, peas, or lentils. Unsalted nuts, nut butters, and seeds. Unsalted canned beans. Lean cuts of beef with fat trimmed off. Low-sodium, lean deli meat.  Dairy  Low-fat (1%) or fat-free (skim) milk. Fat-free, low-fat, or reduced-fat cheeses. Nonfat, low-sodium ricotta or cottage cheese. Low-fat or nonfat yogurt. Low-fat, low-sodium cheese.  Fats and oils  Soft margarine without trans fats. Vegetable oil. Low-fat, reduced-fat, or light mayonnaise and salad dressings (reduced-sodium). Canola, safflower, olive, soybean, and sunflower oils. Avocado.  Seasoning and other foods  Herbs. Spices. Seasoning mixes without salt. Unsalted popcorn and pretzels. Fat-free sweets.  What foods are not recommended?  The items listed may not be a complete list. Talk with your dietitian about what dietary choices are best for you.  Grains  Baked goods made with fat, such as croissants, muffins, or some breads. Dry pasta or rice meal packs.  Vegetables  Creamed or fried vegetables. Vegetables in a cheese sauce. Regular canned vegetables (not low-sodium or reduced-sodium). Regular canned tomato sauce and paste (not low-sodium or reduced-sodium). Regular tomato and vegetable juice (not low-sodium or reduced-sodium). Pickles. Olives.  Fruits  Canned fruit in a light or heavy syrup. Fried fruit. Fruit in cream or butter sauce.  Meat and other protein foods  Fatty cuts of meat. Ribs. Fried meat. Bacon. Sausage. Bologna and other processed lunch meats. Salami. Fatback. Hotdogs. Bratwurst. Salted nuts and seeds. Canned beans with added salt. Canned or smoked fish. Whole eggs or egg yolks. Chicken or turkey with skin.  Dairy  Whole or 2% milk, cream, and half-and-half. Whole or full-fat cream cheese. Whole-fat or sweetened yogurt. Full-fat cheese. Nondairy creamers. Whipped toppings.  Processed cheese and cheese spreads.  Fats and oils  Butter. Stick margarine. Lard. Shortening. Ghee. Bacon fat. Tropical oils, such as coconut, palm kernel, or palm oil.  Seasoning and other foods  Salted popcorn and pretzels. Onion salt, garlic salt, seasoned salt, table salt, and sea salt. Worcestershire sauce. Tartar sauce. Barbecue sauce. Teriyaki sauce. Soy sauce, including reduced-sodium. Steak sauce. Canned and packaged gravies. Fish sauce. Oyster sauce. Cocktail sauce. Horseradish that you find on the shelf. Ketchup. Mustard. Meat flavorings and tenderizers. Bouillon cubes. Hot sauce and Tabasco sauce. Premade or packaged marinades. Premade or packaged taco seasonings. Relishes. Regular salad dressings.  Where to find more information:  · National Heart, Lung, and Blood Institute: www.nhlbi.nih.gov  · American Heart Association: www.heart.org  Summary  · The DASH eating plan is a healthy eating plan that has been shown to reduce high blood pressure (hypertension). It may also reduce your risk for type 2 diabetes, heart disease, and stroke.  · With the   DASH eating plan, you should limit salt (sodium) intake to 2,300 mg a day. If you have hypertension, you may need to reduce your sodium intake to 1,500 mg a day.  · When on the DASH eating plan, aim to eat more fresh fruits and vegetables, whole grains, lean proteins, low-fat dairy, and heart-healthy fats.  · Work with your health care provider or diet and nutrition specialist (dietitian) to adjust your eating plan to your individual calorie needs.  This information is not intended to replace advice given to you by your health care provider. Make sure you discuss any questions you have with your health care provider.  Document Released: 12/02/2011 Document Revised: 12/06/2016 Document Reviewed: 12/06/2016  Elsevier Interactive Patient Education © 2019 Elsevier Inc.        Hypertension  Hypertension, commonly called high blood pressure, is when the force of  blood pumping through the arteries is too strong. The arteries are the blood vessels that carry blood from the heart throughout the body. Hypertension forces the heart to work harder to pump blood and may cause arteries to become narrow or stiff. Having untreated or uncontrolled hypertension can cause heart attacks, strokes, kidney disease, and other problems.  A blood pressure reading consists of a higher number over a lower number. Ideally, your blood pressure should be below 120/80. The first ("top") number is called the systolic pressure. It is a measure of the pressure in your arteries as your heart beats. The second ("bottom") number is called the diastolic pressure. It is a measure of the pressure in your arteries as the heart relaxes.  What are the causes?  The cause of this condition is not known.  What increases the risk?  Some risk factors for high blood pressure are under your control. Others are not.  Factors you can change  · Smoking.  · Having type 2 diabetes mellitus, high cholesterol, or both.  · Not getting enough exercise or physical activity.  · Being overweight.  · Having too much fat, sugar, calories, or salt (sodium) in your diet.  · Drinking too much alcohol.  Factors that are difficult or impossible to change  · Having chronic kidney disease.  · Having a family history of high blood pressure.  · Age. Risk increases with age.  · Race. You may be at higher risk if you are African-American.  · Gender. Men are at higher risk than women before age 45. After age 65, women are at higher risk than men.  · Having obstructive sleep apnea.  · Stress.  What are the signs or symptoms?  Extremely high blood pressure (hypertensive crisis) may cause:  · Headache.  · Anxiety.  · Shortness of breath.  · Nosebleed.  · Nausea and vomiting.  · Severe chest pain.  · Jerky movements you cannot control (seizures).  How is this diagnosed?  This condition is diagnosed by measuring your blood pressure while you are  seated, with your arm resting on a surface. The cuff of the blood pressure monitor will be placed directly against the skin of your upper arm at the level of your heart. It should be measured at least twice using the same arm. Certain conditions can cause a difference in blood pressure between your right and left arms.  Certain factors can cause blood pressure readings to be lower or higher than normal (elevated) for a short period of time:  · When your blood pressure is higher when you are in a health care   provider's office than when you are at home, this is called white coat hypertension. Most people with this condition do not need medicines.  · When your blood pressure is higher at home than when you are in a health care provider's office, this is called masked hypertension. Most people with this condition may need medicines to control blood pressure.  If you have a high blood pressure reading during one visit or you have normal blood pressure with other risk factors:  · You may be asked to return on a different day to have your blood pressure checked again.  · You may be asked to monitor your blood pressure at home for 1 week or longer.  If you are diagnosed with hypertension, you may have other blood or imaging tests to help your health care provider understand your overall risk for other conditions.  How is this treated?  This condition is treated by making healthy lifestyle changes, such as eating healthy foods, exercising more, and reducing your alcohol intake. Your health care provider may prescribe medicine if lifestyle changes are not enough to get your blood pressure under control, and if:  · Your systolic blood pressure is above 130.  · Your diastolic blood pressure is above 80.  Your personal target blood pressure may vary depending on your medical conditions, your age, and other factors.  Follow these instructions at home:  Eating and drinking    · Eat a diet that is high in fiber and potassium, and  low in sodium, added sugar, and fat. An example eating plan is called the DASH (Dietary Approaches to Stop Hypertension) diet. To eat this way:  ? Eat plenty of fresh fruits and vegetables. Try to fill half of your plate at each meal with fruits and vegetables.  ? Eat whole grains, such as whole wheat pasta, brown rice, or whole grain bread. Fill about one quarter of your plate with whole grains.  ? Eat or drink low-fat dairy products, such as skim milk or low-fat yogurt.  ? Avoid fatty cuts of meat, processed or cured meats, and poultry with skin. Fill about one quarter of your plate with lean proteins, such as fish, chicken without skin, beans, eggs, and tofu.  ? Avoid premade and processed foods. These tend to be higher in sodium, added sugar, and fat.  · Reduce your daily sodium intake. Most people with hypertension should eat less than 1,500 mg of sodium a day.  · Limit alcohol intake to no more than 1 drink a day for nonpregnant women and 2 drinks a day for men. One drink equals 12 oz of beer, 5 oz of wine, or 1½ oz of hard liquor.  Lifestyle    · Work with your health care provider to maintain a healthy body weight or to lose weight. Ask what an ideal weight is for you.  · Get at least 30 minutes of exercise that causes your heart to beat faster (aerobic exercise) most days of the week. Activities may include walking, swimming, or biking.  · Include exercise to strengthen your muscles (resistance exercise), such as pilates or lifting weights, as part of your weekly exercise routine. Try to do these types of exercises for 30 minutes at least 3 days a week.  · Do not use any products that contain nicotine or tobacco, such as cigarettes and e-cigarettes. If you need help quitting, ask your health care provider.  · Monitor your blood pressure at home as told by your health   care provider.  · Keep all follow-up visits as told by your health care provider. This is important.  Medicines  · Take over-the-counter and  prescription medicines only as told by your health care provider. Follow directions carefully. Blood pressure medicines must be taken as prescribed.  · Do not skip doses of blood pressure medicine. Doing this puts you at risk for problems and can make the medicine less effective.  · Ask your health care provider about side effects or reactions to medicines that you should watch for.  Contact a health care provider if:  · You think you are having a reaction to a medicine you are taking.  · You have headaches that keep coming back (recurring).  · You feel dizzy.  · You have swelling in your ankles.  · You have trouble with your vision.  Get help right away if:  · You develop a severe headache or confusion.  · You have unusual weakness or numbness.  · You feel faint.  · You have severe pain in your chest or abdomen.  · You vomit repeatedly.  · You have trouble breathing.  Summary  · Hypertension is when the force of blood pumping through your arteries is too strong. If this condition is not controlled, it may put you at risk for serious complications.  · Your personal target blood pressure may vary depending on your medical conditions, your age, and other factors. For most people, a normal blood pressure is less than 120/80.  · Hypertension is treated with lifestyle changes, medicines, or a combination of both. Lifestyle changes include weight loss, eating a healthy, low-sodium diet, exercising more, and limiting alcohol.  This information is not intended to replace advice given to you by your health care provider. Make sure you discuss any questions you have with your health care provider.  Document Released: 12/13/2005 Document Revised: 11/10/2016 Document Reviewed: 11/10/2016  Elsevier Interactive Patient Education © 2019 Elsevier Inc.

## 2019-01-31 NOTE — Progress Notes (Signed)
Patient is here for follow up visit.  Subjective:   @Patient  ID: Jonathan Brown, male    DOB: [birth year], 68 y.o.   MRN: [id number]  Chief Complaint  Patient presents with  . Follow-up    1 year f/u CAD, AS, HTN   HPI  68 year old African-American male status post aortic valve replacement using 21 mm pericardial Magna Ease valve for severe aortic stenosis 09/29/2017, Medtronic dual-chamber pacemaker for complete heart block status post aortic valve replacement 10/03/2017. He is here with his wife today. His device is followed by [facility].  Patient denies chest pain, shortness of breath, palpitations, leg edema, orthopnea, PND, TIA/syncope. His BP is elevated today. He last saw his PCP in 09/2018. He is currently only taking irbesartan 150 mg daily.    Past Medical History:  Diagnosis Date  . Arthritis   . Coronary artery disease 07/12/2017   Non occlusive CAD from cardiac catherization   . Diabetes mellitus without complication (HCC) 2014   type 2   . Former smoker   . GERD (gastroesophageal reflux disease)   . Hyperlipidemia   . Hypertension 2012  . PVD (peripheral vascular disease) (HCC)   . S/P AVR    Aortic valve replacement using a 21-mm pericardial Magna Ease (09/29/2017 by Dr. Maren Beach)  . 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 AORTIC VALVE REPLACEMENT N/A 09/29/2017   Procedure: AORTIC VALVE REPLACEMENT (AVR) using 37mm Magna Ease valve;  Surgeon: Kerin Perna, MD;  Location: [facility] OR;  Service: Open Heart Surgery;  Laterality: N/A;  . CARDIAC CATHETERIZATION  07/12/2017  . COLONOSCOPY  2008  . CORONARY ARTERY BYPASS GRAFT    . LEFT AND RIGHT HEART CATHETERIZATION WITH CORONARY ANGIOGRAM N/A 04/02/2014   Procedure: LEFT AND RIGHT HEART CATHETERIZATION WITH CORONARY ANGIOGRAM;   Surgeon: Pamella Pert, MD;  Location: [facility] CATH LAB;  Service: Cardiovascular;  Laterality: N/A;  . PACEMAKER IMPLANT N/A 10/03/2017   Procedure: PACEMAKER IMPLANT;  Surgeon: Marinus Maw, MD;  Location: [facility] INVASIVE CV LAB;  Service: Cardiovascular;  Laterality: N/A;  . TEE WITHOUT CARDIOVERSION N/A 04/30/2014   Procedure: TRANSESOPHAGEAL ECHOCARDIOGRAM (TEE);  Surgeon: Pamella Pert, MD;  Location: [facility] ENDOSCOPY;  Service: Cardiovascular;  Laterality: N/A;  . TEE WITHOUT CARDIOVERSION N/A 09/29/2017   Procedure: TRANSESOPHAGEAL ECHOCARDIOGRAM (TEE);  Surgeon: Donata Clay, Theron Arista, MD;  Location: [facility] OR;  Service: Open Heart Surgery;  Laterality: N/A;    Social History   Socioeconomic History  . Marital status: Married    Spouse name: Not on file  . Number of children: Not on file  . Years of education: Not on file  . Highest education level: Not on file  Occupational History  . Not on file  Social Needs  . Financial resource strain: Not on file  . Food insecurity:    Worry: Not on file    Inability: Not on file  . Transportation needs:    Medical: Not on file    Non-medical: Not on file  Tobacco Use  . Smoking status: Former Smoker    Last attempt to quit: 09/28/2007    Years since quitting: 11.3  . Smokeless tobacco: Never Used  Substance and Sexual Activity  . Alcohol use: No  . Drug  use: Yes    Frequency: 7.0 times per week    Types: Marijuana    Comment: patient says he smokes marajuana once a day  . Sexual activity: Not on file  Lifestyle  . Physical activity:    Days per week: 0 days    Minutes per session: 0 min  . Stress: Not at all  Relationships  . Social connections:    Talks on phone: Not on file    Gets together: Not on file    Attends religious service: Not on file    Active member of club or organization: Not on file    Attends meetings of clubs or organizations: Not on file    Relationship status: Not on file  . Intimate partner violence:    Fear  of current or ex partner: Not on file    Emotionally abused: Not on file    Physically abused: Not on file    Forced sexual activity: Not on file  Other Topics Concern  . Not on file  Social History Narrative   Married, walks for exercise, son lives in [place]   05/2018    Current Outpatient Medications on File Prior to Visit  Medication Sig Dispense Refill  . acetaminophen (TYLENOL) 500 MG tablet Take 1,000 mg by mouth daily as needed for moderate pain.    Marland Kitchen. aspirin EC 81 MG tablet Take 1 tablet (81 mg total) by mouth daily. 90 tablet 3  . glucose blood test strip 100 each by Other route daily. Use as instructed   Dispense 100 box   12 refills     . irbesartan (AVAPRO) 150 MG tablet Take 1 tablet (150 mg total) by mouth daily. 30 tablet 2  . metFORMIN (GLUCOPHAGE) 500 MG tablet TAKE ONE TABLET BY MOUTH TWICE DAILY WITH  MEAL 180 tablet 1  . metoprolol tartrate (LOPRESSOR) 50 MG tablet Take 1 tablet (50 mg total) by mouth 2 (two) times daily. 180 tablet 3  . Multiple Vitamins-Minerals (MULTIVITAMIN WITH MINERALS) tablet Take 1 tablet by mouth daily.    Marland Kitchen. omeprazole (PRILOSEC) 40 MG capsule Take 1 capsule (40 mg total) by mouth daily. 90 capsule 3  . rosuvastatin (CRESTOR) 20 MG tablet Take 1 tablet (20 mg total) by mouth at bedtime. 90 tablet 3  . Tetrahydrozoline HCl (VISINE OP) Apply 1 drop to eye daily as needed (dry eyes).     No current facility-administered medications on file prior to visit.      Review of Systems  Constitution: Negative for decreased appetite, malaise/fatigue, weight gain and weight loss.  Eyes: Negative for visual disturbance.  Cardiovascular: Negative for chest pain, dyspnea on exertion, leg swelling and syncope.  Respiratory: Negative for shortness of breath.   Endocrine: Negative for cold intolerance.  Hematologic/Lymphatic: Does not bruise/bleed easily.  Skin: Negative for itching and rash.  Musculoskeletal: Negative for myalgias.    Gastrointestinal: Negative for abdominal pain, nausea and vomiting.  Genitourinary: Negative for dysuria.  Neurological: Negative for dizziness and weakness.  Psychiatric/Behavioral: The patient is not nervous/anxious.   All other systems reviewed and are negative.      Objective:   There were no vitals filed for this visit.   Physical Exam  Constitutional: He is oriented to person, place, and time. He appears well-developed and well-nourished. No distress.  HENT:  Head: Normocephalic and atraumatic.  Eyes: Pupils are equal, round, and reactive to light. Conjunctivae are normal.  Neck: No JVD present.  Cardiovascular: Normal rate  and regular rhythm.  Pulmonary/Chest: Effort normal and breath sounds normal. He has no wheezes. He has no rales.  Sternotomy scar with kelloid Pacemaker pocket scar  Abdominal: Soft. Bowel sounds are normal. There is no rebound.  Musculoskeletal:        General: No edema.  Lymphadenopathy:    He has no cervical adenopathy.  Neurological: He is alert and oriented to person, place, and time. No cranial nerve deficit.  Skin: Skin is warm and dry.  Psychiatric: He has a normal mood and affect.  Nursing note and vitals reviewed.       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  Coronary artery disease involving native heart without angina pectoris, unspecified vessel or lesion type - Plan: EKG 12-Lead-Paced rhythm No angina symptoms. Continue aspirin 81 mg daily.  Aggressive risk factor modification. Continue crestor.  Essential hypertension -  Uncontrolled Plan: amLODipine (NORVASC) 10 MG tablet Counseled regarding low salt diet and regular physical activity. Repeat BP visit in 2 weeks.   S/P AVR  (aortic valve replacement) Well functioning.  S/P placement of cardiac pacemaker Followed by Dr. Jilda Roche, MD [facility] [place] Pager: [phone] Office: [phone] If no answer Cell [phone]

## 2019-02-15 ENCOUNTER — Ambulatory Visit (INDEPENDENT_AMBULATORY_CARE_PROVIDER_SITE_OTHER): Payer: Medicare HMO | Admitting: Cardiology

## 2019-02-15 VITALS — BP 153/88 | HR 73 | Ht 69.0 in | Wt 205.1 lb

## 2019-02-15 DIAGNOSIS — I10 Essential (primary) hypertension: Secondary | ICD-10-CM | POA: Diagnosis not present

## 2019-03-28 ENCOUNTER — Other Ambulatory Visit: Payer: Self-pay | Admitting: Medical

## 2019-03-28 NOTE — Telephone Encounter (Signed)
Is this ok to refill?  

## 2019-04-04 ENCOUNTER — Other Ambulatory Visit: Payer: Self-pay | Admitting: Medical

## 2019-04-12 ENCOUNTER — Telehealth: Payer: Self-pay | Admitting: Medical

## 2019-04-12 NOTE — Telephone Encounter (Signed)
Pt's wife called and states that Avapro is out of stock at her pharmacy and they are unsure when it will be available. Please advise pt at [phone]. Pharmacy is [facility].

## 2019-04-13 ENCOUNTER — Other Ambulatory Visit: Payer: Self-pay | Admitting: Medical

## 2019-04-13 ENCOUNTER — Telehealth: Payer: Self-pay | Admitting: Medical

## 2019-04-13 MED ORDER — OLMESARTAN MEDOXOMIL 20 MG PO TABS: 20.0000 mg | ORAL_TABLET | Freq: Every day | ORAL | 2 refills | Status: DC

## 2019-04-13 MED ORDER — VALSARTAN 80 MG PO TABS: 80.0000 mg | ORAL_TABLET | Freq: Every day | ORAL | 2 refills | Status: DC

## 2019-04-13 NOTE — Telephone Encounter (Signed)
Please call pharmacy.   I got message today that Irbesartan was on back order, so I sent Olmesartan as alternate.   Already failed Losartan.   So what ARB would pharmacist like me to try that may be covered by insurance???

## 2019-04-13 NOTE — Telephone Encounter (Signed)
Patient notified

## 2019-04-13 NOTE — Telephone Encounter (Signed)
Called pharmacy and they said, they do not know what is covered and price that it is until the med is sent in, pt could call their insurance company and find out whats covered but maybe not the price.    Please advise

## 2019-04-13 NOTE — Telephone Encounter (Signed)
I sent diovan as an alternate.

## 2019-04-13 NOTE — Telephone Encounter (Signed)
Pt's wife was informed new med sent to pharmacy but if it was too expensive to let us know

## 2019-04-13 NOTE — Telephone Encounter (Signed)
Pt's wife called  rx you sent in today ( did not know name of med) Is too expensive per pt   ($45)

## 2019-04-13 NOTE — Telephone Encounter (Signed)
I sent olmesartan to replace Avapro.  This is a similar medication and similar dose.

## 2019-04-17 ENCOUNTER — Ambulatory Visit (INDEPENDENT_AMBULATORY_CARE_PROVIDER_SITE_OTHER): Payer: Medicare HMO | Admitting: *Deleted

## 2019-04-17 ENCOUNTER — Other Ambulatory Visit: Payer: Self-pay

## 2019-04-17 DIAGNOSIS — I442 Atrioventricular block, complete: Secondary | ICD-10-CM | POA: Diagnosis not present

## 2019-04-17 LAB — CUP PACEART REMOTE DEVICE CHECK
Battery Remaining Longevity: 109 mo
Battery Voltage: 3 V
Brady Statistic AP VP Percent: 10.24 %
Brady Statistic AP VS Percent: 0 %
Brady Statistic AS VP Percent: 89.6 %
Brady Statistic AS VS Percent: 0.16 %
Brady Statistic RA Percent Paced: 10.25 %
Brady Statistic RV Percent Paced: 99.84 %
Date Time Interrogation Session: 20200421052125
Implantable Lead Implant Date: 20181008
Implantable Lead Implant Date: 20181008
Implantable Lead Location: 753859
Implantable Lead Location: 753860
Implantable Lead Model: 3830
Implantable Lead Model: 5076
Implantable Pulse Generator Implant Date: 20181008
Lead Channel Impedance Value: 304 Ohm
Lead Channel Impedance Value: 323 Ohm
Lead Channel Impedance Value: 437 Ohm
Lead Channel Impedance Value: 532 Ohm
Lead Channel Pacing Threshold Amplitude: 1 V
Lead Channel Pacing Threshold Amplitude: 1.125 V
Lead Channel Pacing Threshold Pulse Width: 0.4 ms
Lead Channel Pacing Threshold Pulse Width: 0.4 ms
Lead Channel Sensing Intrinsic Amplitude: 3 mV
Lead Channel Sensing Intrinsic Amplitude: 3 mV
Lead Channel Sensing Intrinsic Amplitude: 4.5 mV
Lead Channel Sensing Intrinsic Amplitude: 4.5 mV
Lead Channel Setting Pacing Amplitude: 2 V
Lead Channel Setting Pacing Amplitude: 2.5 V
Lead Channel Setting Pacing Pulse Width: 0.5 ms
Lead Channel Setting Sensing Sensitivity: 2 mV

## 2019-04-23 NOTE — Progress Notes (Signed)
Remote pacemaker transmission.   

## 2019-05-08 DIAGNOSIS — R69 Illness, unspecified: Secondary | ICD-10-CM | POA: Diagnosis not present

## 2019-05-15 ENCOUNTER — Other Ambulatory Visit: Payer: Self-pay | Admitting: Medical

## 2019-05-16 ENCOUNTER — Ambulatory Visit (INDEPENDENT_AMBULATORY_CARE_PROVIDER_SITE_OTHER): Payer: Medicare HMO | Admitting: Cardiology

## 2019-05-16 ENCOUNTER — Encounter: Payer: Self-pay | Admitting: Cardiology

## 2019-05-16 ENCOUNTER — Other Ambulatory Visit: Payer: Self-pay

## 2019-05-16 VITALS — Ht 69.0 in | Wt 190.0 lb

## 2019-05-16 DIAGNOSIS — Z950 Presence of cardiac pacemaker: Secondary | ICD-10-CM | POA: Diagnosis not present

## 2019-05-16 DIAGNOSIS — I70213 Atherosclerosis of native arteries of extremities with intermittent claudication, bilateral legs: Secondary | ICD-10-CM | POA: Diagnosis not present

## 2019-05-16 DIAGNOSIS — Z952 Presence of prosthetic heart valve: Secondary | ICD-10-CM | POA: Diagnosis not present

## 2019-05-16 DIAGNOSIS — I10 Essential (primary) hypertension: Secondary | ICD-10-CM

## 2019-05-16 MED ORDER — AMLODIPINE BESYLATE 10 MG PO TABS: 10.0000 mg | ORAL_TABLET | Freq: Every day | ORAL | 2 refills | Status: DC

## 2019-05-16 NOTE — Progress Notes (Signed)
Virtual Visit via Telephone Note: Patient unable to use video assisted device.  This visit type was conducted due to national recommendations for restrictions regarding the COVID-19 Pandemic (e.g. social distancing).  This format is felt to be most appropriate for this patient at this time.  All issues noted in this document were discussed and addressed.  No physical exam was performed.  The patient has consented to conduct a Telehealth visit and understands insurance will be billed.   I connected with@, on 05/16/19 at  by TELEPHONE and verified that I am speaking with the correct person using two identifiers.   I discussed the limitations of evaluation and management by telemedicine and the availability of in person appointments. The patient expressed understanding and agreed to proceed.   I have discussed with patient regarding the safety during COVID Pandemic and steps and precautions to be taken including social distancing, frequent hand wash and use of detergent soap, gels with the patient. I asked the patient to avoid touching mouth, nose, eyes, ears with the hands. I encouraged regular walking around the neighborhood and exercise and regular diet, as long as social distancing can be maintained.   Primary Physician/Referring:  Jac Canavan, PA-C  Patient ID: Jonathan Brown, male    DOB: [birth year], 68 y.o.   MRN: [id number]  Chief Complaint  Patient presents with  . Coronary Artery Disease  . Hypertension  . PVD  . Follow-up    22mo    HPI: Jonathan Brown  is a 68 y.o. male  status post aortic valve replacement using 21 mm pericardial Magna Ease valve for severe aortic stenosis 09/29/2017, Medtronic dual-chamber pacemaker for complete heart block status post aortic valve replacement 10/03/2017. He has hypertension, Mixed hyperlipidemia, PAD.  He has no significant coronary artery disease.   He is presently doing well and symptoms of claudication and remained stable, right  like symptoms are slightly worse than the left.   He is tolerating amlodipine well, no leg edema, denies any palpitations, dizziness or syncope.  Past Medical History:  Diagnosis Date  . Arthritis   . Diabetes mellitus without complication (HCC) 2014   type 2   . Former smoker   . GERD (gastroesophageal reflux disease)   . Hyperlipidemia   . Hypertension 2012  . PVD (peripheral vascular disease) (HCC)   . S/P AVR    Aortic valve replacement using a 21-mm pericardial Magna Ease (09/29/2017 by Dr. Maren Beach)  . 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 AORTIC VALVE REPLACEMENT N/A 09/29/2017   Procedure: AORTIC VALVE REPLACEMENT (AVR) using 41mm Magna Ease valve;  Surgeon: Kerin Perna, MD;  Location: [facility] OR;  Service: Open Heart Surgery;  Laterality: N/A;  . CARDIAC CATHETERIZATION  07/12/2017  . COLONOSCOPY  2008  . LEFT AND RIGHT HEART CATHETERIZATION WITH CORONARY ANGIOGRAM N/A 04/02/2014   Procedure: LEFT AND RIGHT HEART CATHETERIZATION WITH CORONARY ANGIOGRAM;  Surgeon: Pamella Pert, MD;  Location: [facility] CATH LAB;  Service: Cardiovascular;  Laterality: N/A;  . PACEMAKER IMPLANT N/A 10/03/2017   Procedure: PACEMAKER IMPLANT;  Surgeon: Marinus Maw, MD;  Location: [facility] INVASIVE CV LAB;  Service: Cardiovascular;  Laterality: N/A;  . TEE WITHOUT CARDIOVERSION N/A 04/30/2014  . TEE WITHOUT CARDIOVERSION N/A 09/29/2017  Procedure: TRANSESOPHAGEAL ECHOCARDIOGRAM (TEE);  Surgeon: Donata Clay, Theron Arista, MD;  Location: [facility] OR;  Service: Open Heart Surgery;  Laterality: N/A;    Social History   Socioeconomic History  . Marital status: Married    Spouse name: Not on file  . Number of children: 0  . Years of education: Not on file  . Highest education level: Not on file  Occupational History  . Not on file   Social Needs  . Financial resource strain: Not on file  . Food insecurity:    Worry: Not on file    Inability: Not on file  . Transportation needs:    Medical: Not on file    Non-medical: Not on file  Tobacco Use  . Smoking status: Former Smoker    Packs/day: 0.25    Years: 30.00    Pack years: 7.50    Last attempt to quit: 09/28/2007    Years since quitting: 11.6  . Smokeless tobacco: Never Used  Substance and Sexual Activity  . Alcohol use: No  . Drug use: Yes    Frequency: 7.0 times per week    Types: Marijuana    Comment: patient says he smokes marajuana once a day  . Sexual activity: Not on file  Lifestyle  . Physical activity:    Days per week: 0 days    Minutes per session: 0 min  . Stress: Not at all  Relationships  . Social connections:    Talks on phone: Not on file    Gets together: Not on file    Attends religious service: Not on file    Active member of club or organization: Not on file    Attends meetings of clubs or organizations: Not on file    Relationship status: Not on file  . Intimate partner violence:    Fear of current or ex partner: Not on file    Emotionally abused: Not on file    Physically abused: Not on file    Forced sexual activity: Not on file  Other Topics Concern  . Not on file  Social History Narrative   Married, walks for exercise, son lives in [place], [place].   05/2018   Review of Systems  Constitution: Negative for chills, decreased appetite, malaise/fatigue and weight gain.  Cardiovascular: Positive for claudication (right leg is worse than left ) and leg swelling. Negative for dyspnea on exertion and syncope.  Endocrine: Negative for cold intolerance.  Hematologic/Lymphatic: Does not bruise/bleed easily.  Musculoskeletal: Negative for joint swelling.  Gastrointestinal: Negative for abdominal pain, anorexia, change in bowel habit, hematochezia and melena.  Neurological: Negative for headaches and light-headedness.   Psychiatric/Behavioral: Negative for depression and substance abuse.  All other systems reviewed and are negative.     Objective  Height  (1.753 m), weight 190 lb (86.2 kg). Body mass index is 28.06 kg/m.  Physical exam not performed or limited due to virtual visit.  Patient appeared to be in no distress, Neck was supple, respiration was not labored.  Please see exam details from prior visit is as below.     Physical Exam  Constitutional: He appears well-developed and well-nourished. No distress.  HENT:  Head: Atraumatic.  Eyes: Conjunctivae are normal.  Neck: Neck supple. No JVD present. No thyromegaly present.  Cardiovascular: Normal rate, regular rhythm and normal heart sounds. Exam reveals no gallop.  No murmur heard. Pulses:      Carotid pulses are 2+ on the right side and 2+ on the  left side.      Femoral pulses are 2+ on the right side and 2+ on the left side.      Dorsalis pedis pulses are 0 on the right side and 0 on the left side.       Posterior tibial pulses are 0 on the right side and 0 on the left side.  Pulmonary/Chest: Effort normal and breath sounds normal.  Sternotomy scar noted with keloid formation.  Abdominal: Soft. Bowel sounds are normal.  Musculoskeletal: Normal range of motion.  Neurological: He is alert.  Skin: Skin is warm and dry.  Psychiatric: He has a normal mood and affect.   Radiology: No results found.  Laboratory examination:    CMP Latest Ref Rng & Units 06/15/2018 02/14/2018 10/05/2017  Glucose 65 - 99 mg/dL 191(Y117(H) 782(N121(H) 562(Z114(H)  BUN 8 - 27 mg/dL 13 13 18   Creatinine 0.76 - 1.27 mg/dL 3.080.97 6.571.05 8.461.02  Sodium 134 - 144 mmol/L 143 141 139  Potassium 3.5 - 5.2 mmol/L 4.2 4.4 4.3  Chloride 96 - 106 mmol/L 107(H) 106 106  CO2 20 - 29 mmol/L 21 21 22   Calcium 8.6 - 10.2 mg/dL 96.210.1 95.210.0 9.2  Total Protein 6.0 - 8.5 g/dL 6.9 6.8 -  Total Bilirubin 0.0 - 1.2 mg/dL 0.5 0.6 -  Alkaline Phos 39 - 117 IU/L 95 98 -  AST 0 - 40 IU/L 25 16 -   ALT 0 - 44 IU/L 36 22 -   CBC Latest Ref Rng & Units 02/14/2018 10/05/2017 10/03/2017  WBC 3.4 - 10.8 x10E3/uL 5.6 11.7(H) 11.8(H)  Hemoglobin 13.0 - 17.7 g/dL 84.113.5 3.2(G9.8(L) 4.0(N8.7(L)  Hematocrit 37.5 - 51.0 % 40.5 30.5(L) 26.5(L)  Platelets 150 - 379 x10E3/uL 195 177 103(L)   Lipid Panel     Component Value Date/Time   CHOL 146 06/15/2018 0904   TRIG 164 (H) 06/15/2018 0904   HDL 33 (L) 06/15/2018 0904   CHOLHDL 4.4 06/15/2018 0904   CHOLHDL 4.3 06/14/2017 0835   VLDL 16 06/14/2017 0835   LDLCALC 80 06/15/2018 0904   HEMOGLOBIN A1C Lab Results  Component Value Date   HGBA1C 6.1 (A) 10/19/2018   MPG 99.67 09/27/2017   TSH No results for input(s): TSH in the last 8760 hours.  PRN Meds:. Medications Discontinued During This Encounter  Medication Reason  . Multiple Vitamins-Minerals (MULTIVITAMIN WITH MINERALS) tablet Patient Preference   Current Meds  Medication Sig  . acetaminophen (TYLENOL) 500 MG tablet Take 1,000 mg by mouth daily as needed for moderate pain.  Marland Kitchen. aspirin EC 81 MG tablet Take 1 tablet (81 mg total) by mouth daily.  . metFORMIN (GLUCOPHAGE) 500 MG tablet TAKE ONE TABLET BY MOUTH TWICE DAILY WITH  MEAL  . metoprolol tartrate (LOPRESSOR) 50 MG tablet Take 1 tablet by mouth twice daily  . omeprazole (PRILOSEC) 40 MG capsule Take 1 capsule by mouth once daily  . rosuvastatin (CRESTOR) 20 MG tablet Take 1 tablet (20 mg total) by mouth at bedtime.  . Tetrahydrozoline HCl (VISINE OP) Apply 1 drop to eye daily as needed (dry eyes).  . valsartan (DIOVAN) 80 MG tablet Take 1 tablet (80 mg total) by mouth daily.  . vitamin B-12 (CYANOCOBALAMIN) 50 MCG tablet Take 50 mcg by mouth daily.  . Vitamin D, Cholecalciferol, 10 MCG (400 UNIT) CAPS Take by mouth daily.  . Vitamin E 100 units TABS Take by mouth daily.    Cardiac Studies:   Right and left heart catheterization 07/12/2017:  Normal  to mild Mild elevation in pulmonary artery pressure, normal LVEDP, PA pressure  35/12, mean 23 mmHg. Mild noncritical coronary artery disease, ostial ramus 30% calcific stenosis, otherwise 10-20% luminal irregularity in all 3 vessels. Mild disease in the RCA. Aortic valve could not be crossed. Heavily calcified, mild aortic root dilatation and AV appears to be bicuspid.  Lower extremity arterial duplex 07/06/2017 No hemodynamically significant stenoses are identified in the right lower extremity arterial system. Significant velocity increase at the left distal superficial femoral artery suggestive of >50% stenosis. This exam reveals moderately decreased perfusion of the lower extremity, RABI 0.76 and LABI 0.76. 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 Compared to the study done on 03/06/2014, bilateral ABI has decreased from normal. No change in the left mid SFA stenosis.  Echocardiogram 07/06/2017: Left ventricle cavity is normal in size. Moderate concentric hypertrophy of the left ventricle. Normal global wall motion. Doppler evidence of grade II (pseudonormal) diastolic dysfunction, elevated LAP. Calculated EF 59%. Mild calcification of the aortic valve annulus.  Moderate aortic valve leaflet calcification. Severely restricted aortic valve leaflets. Severe aortic valve stenosis. Aortic valve peak pressure gradient of 76 and mean gradient of  47  mmHg, calculated aortic valve area  0.65 cm. Mild (Grade I) mitral regurgitation. Mild to moderate tricuspid regurgitation. Moderate pulmonary hypertension. Pulmonary artery systolic pressure is estimated at 46 mm Hg. IVC is dilated with poor inspiration collapse consistent with elevated right atrial pressure. Compared to the study done on 02/24/2016, no significant change in the study.  Assessment   Essential hypertension  Atherosclerosis of native artery of both lower extremities with intermittent claudication (HCC)  S/P AVR with Magna Ease Pericardial Tissue Valve, Model 3300TFX, size 2,  09/18/2017: Karsten Ro, MD  Cardiac pacemaker in  situ  MRI safe cardiac pacemaker in situ - Medtronic Azure XT DR MRI dual chamber pacemaker placement 10/03/2017 Dr. Sharrell Ku  EKG 01/31/2019: Normal sinus rhythm at the rate of 62 bpm, left atrial abnormality, V-paced rhythm.  No further analysis.  Recommendations:   68 year old African-American male status post aortic valve replacement using 21 mm pericardial Magna Ease valve for severe aortic stenosis 09/29/2017, Medtronic dual-chamber pacemaker for complete heart block status post aortic valve replacement 10/03/2017. He has hypertension, Mixed hyperlipidemia, PAD.  He has no significant coronary artery disease.  On his last office visit 3 months ago, amlodipine was added for uncontrolled hypertension, although he doesn't give me his blood pressure today states that his blood pressure has been well-controlled when he checked it in [facility] a week ago.   He is tolerating this well. Symptoms of claudication are remained stable, he has continued to remain active.  We'll request Dr. Sharrell Ku to release the pacemaker follow-up and management For convenience of patient care as per him and his wife. OV in 3 months.   Yates Decamp, MD, [facility] 05/16/2019, 10:19 AM [facility] [place] Pager: [phone] Office: [phone] If no answer Cell [phone]

## 2019-05-18 ENCOUNTER — Ambulatory Visit: Payer: Medicare HMO | Admitting: Cardiology

## 2019-05-29 ENCOUNTER — Telehealth: Payer: Self-pay | Admitting: Cardiology

## 2019-05-29 NOTE — Telephone Encounter (Signed)
Spoke w/ pt and confirmed that it was ok to release his information to [facility]. Pt verbalized the ok.

## 2019-06-08 ENCOUNTER — Telehealth: Payer: Self-pay | Admitting: Medical

## 2019-06-08 ENCOUNTER — Other Ambulatory Visit: Payer: Self-pay | Admitting: Medical

## 2019-06-08 MED ORDER — VALSARTAN 80 MG PO TABS: 80.0000 mg | ORAL_TABLET | Freq: Every day | ORAL | 1 refills | Status: DC

## 2019-06-08 NOTE — Telephone Encounter (Signed)
Pharmacy sent refill request for valsartan 80 mg , is requesting refills for 90 days please send to [place], [place] - [address]

## 2019-06-18 ENCOUNTER — Encounter: Payer: Self-pay | Admitting: Medical

## 2019-06-18 ENCOUNTER — Other Ambulatory Visit: Payer: Self-pay

## 2019-06-18 ENCOUNTER — Ambulatory Visit (INDEPENDENT_AMBULATORY_CARE_PROVIDER_SITE_OTHER): Payer: Medicare HMO | Admitting: Medical

## 2019-06-18 VITALS — BP 138/80 | HR 74 | Temp 97.5°F | Resp 16 | Ht 69.0 in | Wt 195.2 lb

## 2019-06-18 DIAGNOSIS — J929 Pleural plaque without asbestos: Secondary | ICD-10-CM

## 2019-06-18 DIAGNOSIS — E118 Type 2 diabetes mellitus with unspecified complications: Secondary | ICD-10-CM | POA: Diagnosis not present

## 2019-06-18 DIAGNOSIS — I10 Essential (primary) hypertension: Secondary | ICD-10-CM

## 2019-06-18 DIAGNOSIS — Z1211 Encounter for screening for malignant neoplasm of colon: Secondary | ICD-10-CM

## 2019-06-18 DIAGNOSIS — I739 Peripheral vascular disease, unspecified: Secondary | ICD-10-CM

## 2019-06-18 DIAGNOSIS — I70213 Atherosclerosis of native arteries of extremities with intermittent claudication, bilateral legs: Secondary | ICD-10-CM

## 2019-06-18 DIAGNOSIS — Z950 Presence of cardiac pacemaker: Secondary | ICD-10-CM

## 2019-06-18 DIAGNOSIS — H538 Other visual disturbances: Secondary | ICD-10-CM

## 2019-06-18 DIAGNOSIS — Z7185 Encounter for immunization safety counseling: Secondary | ICD-10-CM

## 2019-06-18 DIAGNOSIS — E785 Hyperlipidemia, unspecified: Secondary | ICD-10-CM

## 2019-06-18 DIAGNOSIS — R351 Nocturia: Secondary | ICD-10-CM

## 2019-06-18 DIAGNOSIS — R9389 Abnormal findings on diagnostic imaging of other specified body structures: Secondary | ICD-10-CM

## 2019-06-18 DIAGNOSIS — E639 Nutritional deficiency, unspecified: Secondary | ICD-10-CM

## 2019-06-18 DIAGNOSIS — Z550 Illiteracy and low-level literacy: Secondary | ICD-10-CM

## 2019-06-18 DIAGNOSIS — Z87891 Personal history of nicotine dependence: Secondary | ICD-10-CM

## 2019-06-18 DIAGNOSIS — E1141 Type 2 diabetes mellitus with diabetic mononeuropathy: Secondary | ICD-10-CM

## 2019-06-18 DIAGNOSIS — Z0000 Encounter for general adult medical examination without abnormal findings: Secondary | ICD-10-CM | POA: Insufficient documentation

## 2019-06-18 DIAGNOSIS — Z952 Presence of prosthetic heart valve: Secondary | ICD-10-CM

## 2019-06-18 DIAGNOSIS — Z7189 Other specified counseling: Secondary | ICD-10-CM | POA: Insufficient documentation

## 2019-06-18 DIAGNOSIS — K219 Gastro-esophageal reflux disease without esophagitis: Secondary | ICD-10-CM | POA: Diagnosis not present

## 2019-06-18 NOTE — Addendum Note (Signed)
Addended by: Gwinda Maine on: 06/18/2019 10:09 AM   Modules accepted: Orders

## 2019-06-18 NOTE — Patient Instructions (Signed)
Recommendations Please make an appointment with your wife's eye doctor and dentist soon  You should be seeing a dentist twice yearly for routine care and cleaning You should brush and floss your teeth every day  You should be seeing an eye doctor every year for routine diabetic eye care and for the concern you have which may be cataract.  You should also have glaucoma screening.  Make sure the eye doctor sends [place] a copy of your report since we need to see that as your primary care provider.  We will refer you for Cologuard test which is a colon cancer screen  We will call you with updated lab results   Please call your insurance to see if they cover the following vaccines which you are due for: Shingrix shingles vaccine, 2 shots 2 months apart and this would complete the series Pneumococcal 23 pneumonia vaccine, once every 5 years Tdap tetanus diphtheria pertussis vaccine, once every 10 years   Continue to exercise regularly    Pending labs I may need to consider adding a fluid pill since you are having some swelling    I recommend staying away from a lot of fast food which has a lot of salt and sugar which is not good for your diabetes   Check your feet daily for sores or wounds.   We want to keep your diabetes under good control to avoid complications such as amputations, eye disease, worsening heart disease and kidney disease.  This requires you to exercise, to keep your sugars under control to healthy diet and exercise

## 2019-06-18 NOTE — Progress Notes (Signed)
Subjective:    Jonathan Brown is a 68 y.o. male who presents for Preventative Services visit and chronic medical problems/med check visit.    Primary Care Provider Tysinger, Camelia Eng, PA-C here for primary care  Current Health Care Team:  Dentist, Dr. Marlynn Perking   Eye doctor, Dr. None  Cardiology, Dr Lenard Simmer [facility]    Medical Services you may have received from other than [facility] providers in the past year (date may be approximate) cardiology  Exercise Current exercise habits:  Walking, yard work  Nutrition/Diet Current diet: none   Depression Screen Depression screen [facility] 2/9 06/18/2019  Decreased Interest 0  Down, Depressed, Hopeless 0  PHQ - 2 Score 0  Altered sleeping -  Tired, decreased energy -  Change in appetite -  Feeling bad or failure about yourself  -  Trouble concentrating -  Moving slowly or fidgety/restless -  Suicidal thoughts -  PHQ-9 Score -    Activities of Daily Living Screen/Functional Status Survey Is the patient deaf or have difficulty hearing?: No Does the patient have difficulty seeing, even when wearing glasses/contacts?: No Does the patient have difficulty concentrating, remembering, or making decisions?: Yes Does the patient have difficulty walking or climbing stairs?: No Does the patient have difficulty dressing or bathing?: No Does the patient have difficulty doing errands alone such as visiting a doctor's office or shopping?: No  Can patient draw a clock face showing 3:15 oclock, no, unable to write  [place]  06/15/2018 11/15/2017 06/14/2017  Falls in the past year? No No Yes  Risk for fall due to : - - Other (Comment)    Gait Assessment: Normal gait observed: yes  Advanced directives Does patient have a [place]? No  Does patient have a Living Will? No   Past Medical History:  Diagnosis Date  . Arthritis   . Diabetes mellitus without complication ([place]) 1610   type 2   . Former  smoker   . GERD (gastroesophageal reflux disease)   . Hyperlipidemia   . Hypertension 2012  . PVD (peripheral vascular disease) ([place])   . S/P AVR    Aortic valve replacement using a 21-mm pericardial Magna Ease (09/29/2017 by Dr. Darcey Nora)  . 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 AORTIC VALVE REPLACEMENT N/A 09/29/2017   Procedure: AORTIC VALVE REPLACEMENT (AVR) using 58mm Magna Ease valve;  Surgeon: Ivin Poot, MD;  Location: [facility];  Service: Open Heart Surgery;  Laterality: N/A;  . CARDIAC CATHETERIZATION  07/12/2017  . COLONOSCOPY  2008  . LEFT AND RIGHT HEART CATHETERIZATION WITH CORONARY ANGIOGRAM N/A 04/02/2014   Procedure: LEFT AND RIGHT HEART CATHETERIZATION WITH CORONARY ANGIOGRAM;  Surgeon: Laverda Page, MD;  Location: [facility] CATH LAB;  Service: Cardiovascular;  Laterality: N/A;  . PACEMAKER IMPLANT N/A 10/03/2017   Procedure: PACEMAKER IMPLANT;  Surgeon: Evans Lance, MD;  Location: [facility] CV LAB;  Service: Cardiovascular;  Laterality: N/A;  . TEE WITHOUT CARDIOVERSION N/A 04/30/2014  . TEE WITHOUT CARDIOVERSION N/A 09/29/2017   Procedure: TRANSESOPHAGEAL ECHOCARDIOGRAM (TEE);  Surgeon: Prescott Gum, Collier Salina, MD;  Location: [facility];  Service: Open Heart Surgery;  Laterality: N/A;    Social History   Socioeconomic History  . Marital status:  Married    Spouse name: Not on file  . Number of children: 0  . Years of education: Not on file  . Highest education level: Not on file  Occupational History  . Not on file  Social Needs  . Financial resource strain: Not on file  . Food insecurity    Worry: Not on file    Inability: Not on file  . Transportation needs    Medical: Not on file    Non-medical: Not on file  Tobacco Use  . Smoking status: Former Smoker    Packs/day: 0.25     Years: 30.00    Pack years: 7.50    Quit date: 09/28/2007    Years since quitting: 11.7  . Smokeless tobacco: Never Used  Substance and Sexual Activity  . Alcohol use: No  . Drug use: Yes    Frequency: 7.0 times per week    Types: Marijuana    Comment: patient says he smokes marajuana once a day  . Sexual activity: Not on file  Lifestyle  . Physical activity    Days per week: 0 days    Minutes per session: 0 min  . Stress: Not at all  Relationships  . Social Musicianconnections    Talks on phone: Not on file    Gets together: Not on file    Attends religious service: Not on file    Active member of club or organization: Not on file    Attends meetings of clubs or organizations: Not on file    Relationship status: Not on file  . Intimate partner violence    Fear of current or ex partner: Not on file    Emotionally abused: Not on file    Physically abused: Not on file    Forced sexual activity: Not on file  Other Topics Concern  . Not on file  Social History Narrative   Married, walks for exercise, step- son lives in [place], [place]  05/2019    Family History  Problem Relation Age of Onset  . Other Mother        died of old age  . Heart disease Father   . Sleep apnea Sister   . Diabetes Brother        diabetic coma x 3 years  . Alcohol abuse Brother   . Heart disease Brother 48       MI  . Cancer Neg Hx   . Stroke Neg Hx      Current Outpatient Medications:  .  acetaminophen (TYLENOL) 500 MG tablet, Take 1,000 mg by mouth daily as needed for moderate pain., Disp: , Rfl:  .  amLODipine (NORVASC) 10 MG tablet, Take 1 tablet (10 mg total) by mouth daily., Disp: 90 tablet, Rfl: 2 .  aspirin EC 81 MG tablet, Take 1 tablet (81 mg total) by mouth daily., Disp: 90 tablet, Rfl: 3 .  glucose blood test strip, 100 each by Other route daily. Use as instructed   Dispense 100 box   12 refills , Disp: , Rfl:  .  metFORMIN (GLUCOPHAGE) 500 MG tablet, TAKE ONE TABLET BY MOUTH TWICE DAILY  WITH  MEAL, Disp: 180 tablet, Rfl: 1 .  metoprolol tartrate (LOPRESSOR) 50 MG tablet, Take 1 tablet by mouth twice daily, Disp: 180 tablet, Rfl: 0 .  omeprazole (PRILOSEC) 40 MG capsule, Take 1 capsule by mouth once daily, Disp: 90 capsule, Rfl: 0 .  rosuvastatin (CRESTOR) 20 MG tablet, Take 1 tablet (20 mg total)  by mouth at bedtime., Disp: 90 tablet, Rfl: 3 .  valsartan (DIOVAN) 80 MG tablet, Take 1 tablet (80 mg total) by mouth daily., Disp: 90 tablet, Rfl: 1 .  vitamin B-12 (CYANOCOBALAMIN) 50 MCG tablet, Take 50 mcg by mouth daily., Disp: , Rfl:  .  Tetrahydrozoline HCl (VISINE OP), Apply 1 drop to eye daily as needed (dry eyes)., Disp: , Rfl:  .  Vitamin D, Cholecalciferol, 10 MCG (400 UNIT) CAPS, Take by mouth daily., Disp: , Rfl:  .  Vitamin E 100 units TABS, Take by mouth daily., Disp: , Rfl:   No Known Allergies  History reviewed: allergies, current medications, past family history, past medical history, past social history, past surgical history and problem list  Chronic issues discussed: Diabetes-he voices compliance with medications, his wife checks his sugars, and he voices normal glucose readings.  Unfortunately he does drink Pepsi Cola regularly, he notes that the eat out fast food about as often as the eat at home.  He exercises with walking and yard work.  No foot lesions, no polyuria or polydipsia.  He does note blurred vision in the left eye chronically thinks he may have a cataract.  Has not seen an eye doctor in over 10 years.  Hypertension-compliant with blood pressure medications.  No chest pain or difficulty breathing.  Lately has had some swelling in his legs mildly.  Swelling is been bilaterally.  He attributed to the boots he was using.  Hyperlipidemia-compliant with Crestor.  Acute issues discussed: none  Objective:      Biometrics BP 138/80   Pulse 74   Temp (!) 97.5 F (36.4 C) (Oral)   Resp 16   Ht 5\' 9"  (1.753 m)   Wt 195 lb 3.2 oz (88.5 kg)   SpO2  98%   BMI 28.83 kg/m   Wt Readings from Last 3 Encounters:  06/18/19 195 lb 3.2 oz (88.5 kg)  05/16/19 190 lb (86.2 kg)  02/15/19 205 lb 1.6 oz (93 kg)   BP Readings from Last 3 Encounters:  06/18/19 138/80  02/15/19 (!) 153/88  01/31/19 (!) 179/100     Cognitive Testing  Alert? Yes   Normal Appearance?yes   Oriented to person? Yes   Place? Yes    Time? No    Recall of three objects?  No   Can perform simple calculations? No   Displays appropriate judgment?yes   Can read the correct time from a watch face?yes   General appearance: alert, no distress, WD/WN, African-American male  Nutritional Status: Inadequate calore intake? no Loss of muscle mass? no Loss of fat beneath skin? no Localized or general edema? no Diminished functional status? no  Other pertinent exam: HEENT: normocephalic, sclerae anicteric, limited exam due to Covid19 exposure prevention Oral cavity: MMM, no lesions Neck: supple, no lymphadenopathy, no thyromegaly, no masses, no bruits Heart: 2/6 holosystolic murmur heard in bilat upper sternal borders, othewrise RRR, normal S1, S2 Chest: vertical surgical scar  Lungs: CTA bilaterally, no wheezes, rhonchi, or rales Abdomen: +bs, soft, non tender, non distended, no masses, no hepatomegaly, no splenomegaly Musculoskeletal: nontender, no swelling, no obvious deformity Extremities: 1+ bilat LE pitting edema, no cyanosis, no clubbing Pulses: 2+ symmetric, upper extremities, normal cap refill Neurological: alert, oriented x 3, CN2-12 intact, strength normal upper extremities and lower extremities, sensation normal throughout, DTRs 2+ throughout, no cerebellar signs, gait normal Psychiatric: normal affect, behavior normal, pleasant    Diabetic Foot Exam - Simple   Simple Foot Form Diabetic  Foot exam was performed with the following findings: Yes 06/18/2019  9:32 AM  Visual Inspection See comments: Yes Sensation Testing Intact to touch and monofilament  testing bilaterally: Yes Pulse Check See comments: Yes Comments 1+ pedal pulses, thickened toenails particularly with the great toenails bilaterally, elongated toenails in general, due for nail care in general, no other obvious deformities.     Assessment:   Encounter Diagnoses  Name Primary?  . Type 2 diabetes mellitus with complication, without long-term current use of insulin (HCC) Yes  . 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 Medicare annual wellness visit, subsequent   . Essential hypertension   . Atherosclerosis of native artery of both lower extremities with intermittent claudication (HCC)   . PVD (peripheral vascular disease) (HCC)   . Pleural plaque   . Gastroesophageal reflux disease without esophagitis   . 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 Hyperlipidemia, unspecified hyperlipidemia type   . Nocturia   . Poor diet   . Vaccine counseling   . Former smoker   . S/P AVR   . S/P placement of cardiac pacemaker   . Screen for colon cancer   . Blurred vision   . Abnormal chest CT   . Advanced directives, counseling/discussion      Plan:   A preventative services visit was completed today.  During the course of the visit today, we discussed and counseled about appropriate screening and preventive services.  A health risk assessment was established today that included a review of current medications, allergies, social history, family history, medical and preventative health history, biometrics, and preventative screenings to identify potential safety concerns or impairments.  A personalized plan was printed today for your records and use.   Personalized health advice and education was given today to reduce health risks and promote self management and wellness.  Information regarding end of life planning was discussed today.  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  Routine labs today.  Discussed routine foot care, need to establish with eye doctor right away.  Hypertension, heart disease-continue current medication, continue routine cardiology follow-up  Hyperlipidemia-labs today, continue routine medications  GERD- continue omeprazole, avoid GERD triggers  Former smoker, history of abnormal chest CT with pleural plaques in the past-plan for updated CT chest    Acute problems discussed today: Edema- likely diet related, but also he is on amlodipine which can be related to edema.  Counseled on diet, needs to do better with healthier diet, continue regular exercise.  Consider diuretic pending labs  Blurred vision-advised he talked his wife today about getting an eye doctor appointment within the next few days with her eye doctor.  If unable to do this call back so we can try to get him in with an eye doctor right away.  Recommendations:  I recommend a yearly ophthalmology/optometry visit for glaucoma screening and eye checkup  I recommended a yearly dental visit for hygiene and checkup  Advanced directives - discussed nature and purpose of Advanced Directives, encouraged them to complete them if they have not done so and/or encouraged them to get us a copy if they have done this already.    Referrals today: Needs to establish with eye doctor and dentist.  He will establish with  wife's eye doctor and dentist Referral for Cologuard colon cancer screening Referral for chest CT given former smoker and prior pleural plaque on CT scan in the remote past   Immunizations: I recommended a yearly influenza vaccine, typically in September when the vaccine is usually available Advised updated tetanus diphtheria pertussis vaccine, pneumococcal 23, Shingrix vaccine.  He will check insurance coverage  for these  [place] was seen today for mwv.  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  Medicare annual wellness visit, subsequent  Essential hypertension -     Lipid panel -     Comprehensive metabolic panel  Atherosclerosis of native artery of both lower extremities with intermittent claudication (HCC)  PVD (peripheral vascular disease) (HCC)  Pleural plaque -     CBC with Differential/Platelet -     CT Chest W Contrast; Future  Gastroesophageal reflux disease without esophagitis  Diabetic mononeuropathy associated with type 2 diabetes mellitus (HCC) -     CBC with Differential/Platelet  Hyperlipidemia, unspecified hyperlipidemia type -     Lipid panel  Nocturia  Poor diet  Vaccine counseling  Former smoker -     CT Chest W Contrast; Future  S/P AVR  S/P placement of cardiac pacemaker  Screen for colon cancer  Blurred vision  Abnormal chest CT -     CT Chest W Contrast; Future  Advanced directives, counseling/discussion     Medicare Attestation A preventative services visit was completed today.  During the course of the visit the patient was educated and counseled about appropriate screening and preventive services.  A health risk assessment was established with the patient that included a review of current medications, allergies, social history, family history, medical and preventative health history, biometrics, and preventative screenings to identify potential safety concerns or impairments.  A personalized plan was printed today for the patient's records and use.   Personalized health advice and education was given today to reduce health risks and promote self management and wellness.  Information regarding end of life planning was  discussed today.  Kristian Covey, PA-C   06/18/2019

## 2019-06-19 ENCOUNTER — Other Ambulatory Visit: Payer: Self-pay | Admitting: Medical

## 2019-06-19 DIAGNOSIS — E118 Type 2 diabetes mellitus with unspecified complications: Secondary | ICD-10-CM

## 2019-06-19 LAB — COMPREHENSIVE METABOLIC PANEL
ALT: 31 IU/L (ref 0–44)
AST: 26 IU/L (ref 0–40)
Albumin/Globulin Ratio: 1.8 (ref 1.2–2.2)
Albumin: 4.4 g/dL (ref 3.8–4.8)
Alkaline Phosphatase: 100 IU/L (ref 39–117)
BUN/Creatinine Ratio: 12 (ref 10–24)
BUN: 12 mg/dL (ref 8–27)
Bilirubin Total: 0.5 mg/dL (ref 0.0–1.2)
CO2: 20 mmol/L (ref 20–29)
Calcium: 9.9 mg/dL (ref 8.6–10.2)
Chloride: 108 mmol/L — ABNORMAL HIGH (ref 96–106)
Creatinine, Ser: 1.04 mg/dL (ref 0.76–1.27)
GFR calc Af Amer: 85 mL/min/{1.73_m2} (ref >59)
GFR calc non Af Amer: 74 mL/min/{1.73_m2} (ref >59)
Globulin, Total: 2.4 g/dL (ref 1.5–4.5)
Glucose: 126 mg/dL — ABNORMAL HIGH (ref 65–99)
Potassium: 4 mmol/L (ref 3.5–5.2)
Sodium: 142 mmol/L (ref 134–144)
Total Protein: 6.8 g/dL (ref 6.0–8.5)

## 2019-06-19 LAB — LIPID PANEL
Chol/HDL Ratio: 3.5 ratio (ref 0.0–5.0)
Cholesterol, Total: 134 mg/dL (ref 100–199)
HDL: 38 mg/dL — ABNORMAL LOW (ref >39)
LDL Calculated: 71 mg/dL (ref 0–99)
Triglycerides: 126 mg/dL (ref 0–149)
VLDL Cholesterol Cal: 25 mg/dL (ref 5–40)

## 2019-06-19 LAB — CBC WITH DIFFERENTIAL/PLATELET
Basophils Absolute: 0 10*3/uL (ref 0.0–0.2)
Basos: 1 %
EOS (ABSOLUTE): 0.1 10*3/uL (ref 0.0–0.4)
Eos: 3 %
Hematocrit: 39.3 % (ref 37.5–51.0)
Hemoglobin: 13.2 g/dL (ref 13.0–17.7)
Immature Grans (Abs): 0 10*3/uL (ref 0.0–0.1)
Immature Granulocytes: 0 %
Lymphocytes Absolute: 1.6 10*3/uL (ref 0.7–3.1)
Lymphs: 28 %
MCH: 30.6 pg (ref 26.6–33.0)
MCHC: 33.6 g/dL (ref 31.5–35.7)
MCV: 91 fL (ref 79–97)
Monocytes Absolute: 0.5 10*3/uL (ref 0.1–0.9)
Monocytes: 10 %
Neutrophils Absolute: 3.4 10*3/uL (ref 1.4–7.0)
Neutrophils: 58 %
Platelets: 166 10*3/uL (ref 150–450)
RBC: 4.32 x10E6/uL (ref 4.14–5.80)
RDW: 11.9 % (ref 11.6–15.4)
WBC: 5.7 10*3/uL (ref 3.4–10.8)

## 2019-06-19 LAB — MICROALBUMIN / CREATININE URINE RATIO
Creatinine, Urine: 133.2 mg/dL
Microalb/Creat Ratio: 30 mg/g creat — ABNORMAL HIGH (ref 0–29)
Microalbumin, Urine: 39.5 ug/mL

## 2019-06-19 LAB — HEMOGLOBIN A1C
Est. average glucose Bld gHb Est-mCnc: 120 mg/dL
Hgb A1c MFr Bld: 5.8 % — ABNORMAL HIGH (ref 4.8–5.6)

## 2019-06-19 MED ORDER — METFORMIN HCL 500 MG PO TABS: ORAL_TABLET | ORAL | 3 refills | Status: DC

## 2019-06-19 MED ORDER — METOPROLOL TARTRATE 50 MG PO TABS: 50.0000 mg | ORAL_TABLET | Freq: Two times a day (BID) | ORAL | 3 refills | Status: DC

## 2019-06-19 MED ORDER — ASPIRIN EC 81 MG PO TBEC: 81.0000 mg | DELAYED_RELEASE_TABLET | Freq: Every day | ORAL | 3 refills | Status: DC

## 2019-06-19 MED ORDER — ROSUVASTATIN CALCIUM 20 MG PO TABS: 20.0000 mg | ORAL_TABLET | Freq: Every day | ORAL | 3 refills | Status: DC

## 2019-06-19 MED ORDER — OMEPRAZOLE 40 MG PO CPDR: 40.0000 mg | DELAYED_RELEASE_CAPSULE | Freq: Every day | ORAL | 3 refills | Status: DC

## 2019-06-20 ENCOUNTER — Telehealth: Payer: Self-pay | Admitting: Medical

## 2019-06-20 ENCOUNTER — Other Ambulatory Visit: Payer: Self-pay | Admitting: Medical

## 2019-06-20 DIAGNOSIS — I10 Essential (primary) hypertension: Secondary | ICD-10-CM

## 2019-06-20 MED ORDER — HYDROCHLOROTHIAZIDE 12.5 MG PO TABS: 12.5000 mg | ORAL_TABLET | Freq: Every day | ORAL | 2 refills | Status: DC

## 2019-06-20 NOTE — Telephone Encounter (Signed)
See lab message as well.  I got in contact with Dr. Einar Gip.  I want to add a fluid pill called hydrochlorothiazide 12.5 mg tablet once daily.  This is also at the pharmacy along with his other refills.  This is for the swelling in his legs.  Have him take this daily in the morning, make sure he is drinking plenty of water every day.  He needs to cut back on the soda and salty foods in general.  I would like to recheck his basic metabolic panel in 3 weeks at a nurse visit as well as his weight.  This is to make sure his potassium is not running too low

## 2019-06-20 NOTE — Telephone Encounter (Signed)
Patients wife notified

## 2019-06-25 ENCOUNTER — Other Ambulatory Visit: Payer: Self-pay

## 2019-06-25 DIAGNOSIS — J929 Pleural plaque without asbestos: Secondary | ICD-10-CM

## 2019-06-25 DIAGNOSIS — R9389 Abnormal findings on diagnostic imaging of other specified body structures: Secondary | ICD-10-CM

## 2019-06-25 DIAGNOSIS — Z87891 Personal history of nicotine dependence: Secondary | ICD-10-CM

## 2019-07-04 DIAGNOSIS — Z1211 Encounter for screening for malignant neoplasm of colon: Secondary | ICD-10-CM | POA: Diagnosis not present

## 2019-07-06 ENCOUNTER — Telehealth: Payer: Self-pay | Admitting: Medical

## 2019-07-06 MED ORDER — VALSARTAN 80 MG PO TABS: 80.0000 mg | ORAL_TABLET | Freq: Every day | ORAL | 1 refills | Status: DC

## 2019-07-06 NOTE — Telephone Encounter (Signed)
[facility] valsartan  Please send rx there

## 2019-07-06 NOTE — Telephone Encounter (Signed)
Medication is only in stock at [address]

## 2019-07-11 LAB — COLOGUARD: Cologuard: NEGATIVE

## 2019-07-12 ENCOUNTER — Telehealth: Payer: Self-pay | Admitting: Medical

## 2019-07-12 NOTE — Telephone Encounter (Signed)
Please let them know that the Cologuard screening for colon cancer was negative.  This indicates a lower likelihood that colorectal cancer is present.   Lets plan to repeat this in 3 years.  However, if the develop bowel changes, blood in stool, unexpected weight loss, or other new bowel changes, then recheck. 

## 2019-07-12 NOTE — Telephone Encounter (Signed)
Pt was advised [facility] 

## 2019-07-13 ENCOUNTER — Encounter: Payer: Self-pay | Admitting: Medical

## 2019-07-17 ENCOUNTER — Encounter: Payer: Medicare HMO | Admitting: *Deleted

## 2019-07-17 ENCOUNTER — Other Ambulatory Visit: Payer: Self-pay

## 2019-07-17 ENCOUNTER — Ambulatory Visit
Admission: RE | Admit: 2019-07-17 | Discharge: 2019-07-17 | Disposition: A | Discharge status: Home / Self Care | Payer: Medicare HMO | Source: Ambulatory Visit | Attending: Medical | Admitting: Medical

## 2019-07-17 DIAGNOSIS — I2510 Atherosclerotic heart disease of native coronary artery without angina pectoris: Secondary | ICD-10-CM | POA: Diagnosis not present

## 2019-07-17 DIAGNOSIS — Z87891 Personal history of nicotine dependence: Secondary | ICD-10-CM

## 2019-07-17 DIAGNOSIS — Z950 Presence of cardiac pacemaker: Secondary | ICD-10-CM | POA: Diagnosis not present

## 2019-07-17 DIAGNOSIS — J929 Pleural plaque without asbestos: Secondary | ICD-10-CM | POA: Diagnosis not present

## 2019-07-17 DIAGNOSIS — R9389 Abnormal findings on diagnostic imaging of other specified body structures: Secondary | ICD-10-CM

## 2019-07-17 DIAGNOSIS — Z45018 Encounter for adjustment and management of other part of cardiac pacemaker: Secondary | ICD-10-CM | POA: Diagnosis not present

## 2019-07-18 ENCOUNTER — Telehealth: Payer: Self-pay | Admitting: Medical

## 2019-07-18 ENCOUNTER — Other Ambulatory Visit: Payer: Self-pay | Admitting: Medical

## 2019-07-18 ENCOUNTER — Telehealth: Payer: Self-pay

## 2019-07-18 DIAGNOSIS — J929 Pleural plaque without asbestos: Secondary | ICD-10-CM

## 2019-07-18 NOTE — Telephone Encounter (Signed)
error 

## 2019-07-18 NOTE — Telephone Encounter (Signed)
Left message for patient to remind of missed remote transmission.  

## 2019-07-20 ENCOUNTER — Encounter: Payer: Self-pay | Admitting: Cardiology

## 2019-07-20 ENCOUNTER — Other Ambulatory Visit: Payer: Self-pay

## 2019-07-20 DIAGNOSIS — Z45018 Encounter for adjustment and management of other part of cardiac pacemaker: Secondary | ICD-10-CM | POA: Insufficient documentation

## 2019-07-20 HISTORY — DX: Encounter for adjustment and management of other part of cardiac pacemaker: Z45.018

## 2019-07-23 ENCOUNTER — Telehealth: Payer: Self-pay | Admitting: Medical

## 2019-07-23 ENCOUNTER — Other Ambulatory Visit: Payer: Self-pay

## 2019-07-23 DIAGNOSIS — E118 Type 2 diabetes mellitus with unspecified complications: Secondary | ICD-10-CM

## 2019-07-23 MED ORDER — METFORMIN HCL 500 MG PO TABS: ORAL_TABLET | ORAL | 3 refills | Status: DC

## 2019-07-23 NOTE — Telephone Encounter (Signed)
Metformin sent to [place]

## 2019-07-23 NOTE — Telephone Encounter (Signed)
Needs refill Metformin  Changing   [facility] [address]

## 2019-07-25 ENCOUNTER — Telehealth: Payer: Self-pay

## 2019-07-25 NOTE — Telephone Encounter (Signed)
Called pt left msg for him to call back

## 2019-07-25 NOTE — Telephone Encounter (Signed)
-----   Message from Adrian Prows, MD sent at 07/20/2019  6:47 AM EDT ----- Regarding: Pacemaker Normal function. Hope he is doing well.

## 2019-07-27 DIAGNOSIS — Z7189 Other specified counseling: Secondary | ICD-10-CM | POA: Diagnosis not present

## 2019-07-27 DIAGNOSIS — E119 Type 2 diabetes mellitus without complications: Secondary | ICD-10-CM | POA: Diagnosis not present

## 2019-07-27 DIAGNOSIS — E7800 Pure hypercholesterolemia, unspecified: Secondary | ICD-10-CM | POA: Diagnosis not present

## 2019-07-27 DIAGNOSIS — I10 Essential (primary) hypertension: Secondary | ICD-10-CM | POA: Diagnosis not present

## 2019-07-27 DIAGNOSIS — Z03818 Encounter for observation for suspected exposure to other biological agents ruled out: Secondary | ICD-10-CM | POA: Diagnosis not present

## 2019-07-30 ENCOUNTER — Other Ambulatory Visit: Payer: Self-pay | Admitting: Family Medicine

## 2019-07-30 ENCOUNTER — Other Ambulatory Visit: Payer: Self-pay

## 2019-07-30 ENCOUNTER — Telehealth: Payer: Self-pay | Admitting: Family Medicine

## 2019-07-30 DIAGNOSIS — R69 Illness, unspecified: Secondary | ICD-10-CM | POA: Diagnosis not present

## 2019-07-30 DIAGNOSIS — E118 Type 2 diabetes mellitus with unspecified complications: Secondary | ICD-10-CM

## 2019-07-30 MED ORDER — GLUCOSE BLOOD VI STRP: 100.0000 | ORAL_STRIP | Freq: Every day | 5 refills | Status: DC

## 2019-07-30 MED ORDER — ONETOUCH ULTRASOFT LANCETS MISC: 12 refills | Status: DC

## 2019-07-30 MED ORDER — METOPROLOL TARTRATE 50 MG PO TABS: 50.0000 mg | ORAL_TABLET | Freq: Two times a day (BID) | ORAL | 3 refills | Status: DC

## 2019-07-30 NOTE — Telephone Encounter (Signed)
Lancets sent to pharmacy  °

## 2019-07-30 NOTE — Telephone Encounter (Signed)
Pt req Metoprol and One touch test strips [facility] to [facility] [address] [facility], which I moved.  However, they wanted Lancets also and none on record.  Please send.

## 2019-08-01 ENCOUNTER — Telehealth: Payer: Self-pay

## 2019-08-15 ENCOUNTER — Telehealth: Payer: Self-pay

## 2019-08-15 MED ORDER — ROSUVASTATIN CALCIUM 20 MG PO TABS: 20.0000 mg | ORAL_TABLET | Freq: Every day | ORAL | 3 refills | Status: DC

## 2019-08-15 MED ORDER — OMEPRAZOLE 40 MG PO CPDR: 40.0000 mg | DELAYED_RELEASE_CAPSULE | Freq: Every day | ORAL | 3 refills | Status: DC

## 2019-08-15 NOTE — Telephone Encounter (Signed)
Patient wife called and stated patient would [facility] to [facility] and will need the pending one sent there.

## 2019-09-15 IMAGING — DX DG CHEST 2V
2 series · 2 of 2 positions shown · non-contrast
Comparison: CT 08/01/2007

CLINICAL DATA: S/P AVR , patient complains of mild SOB and
congestion, CAD, HTN (patient going to [REDACTED] now)

EXAM:
CHEST  2 VIEW

[dg chest 2 view (1 of 2)]
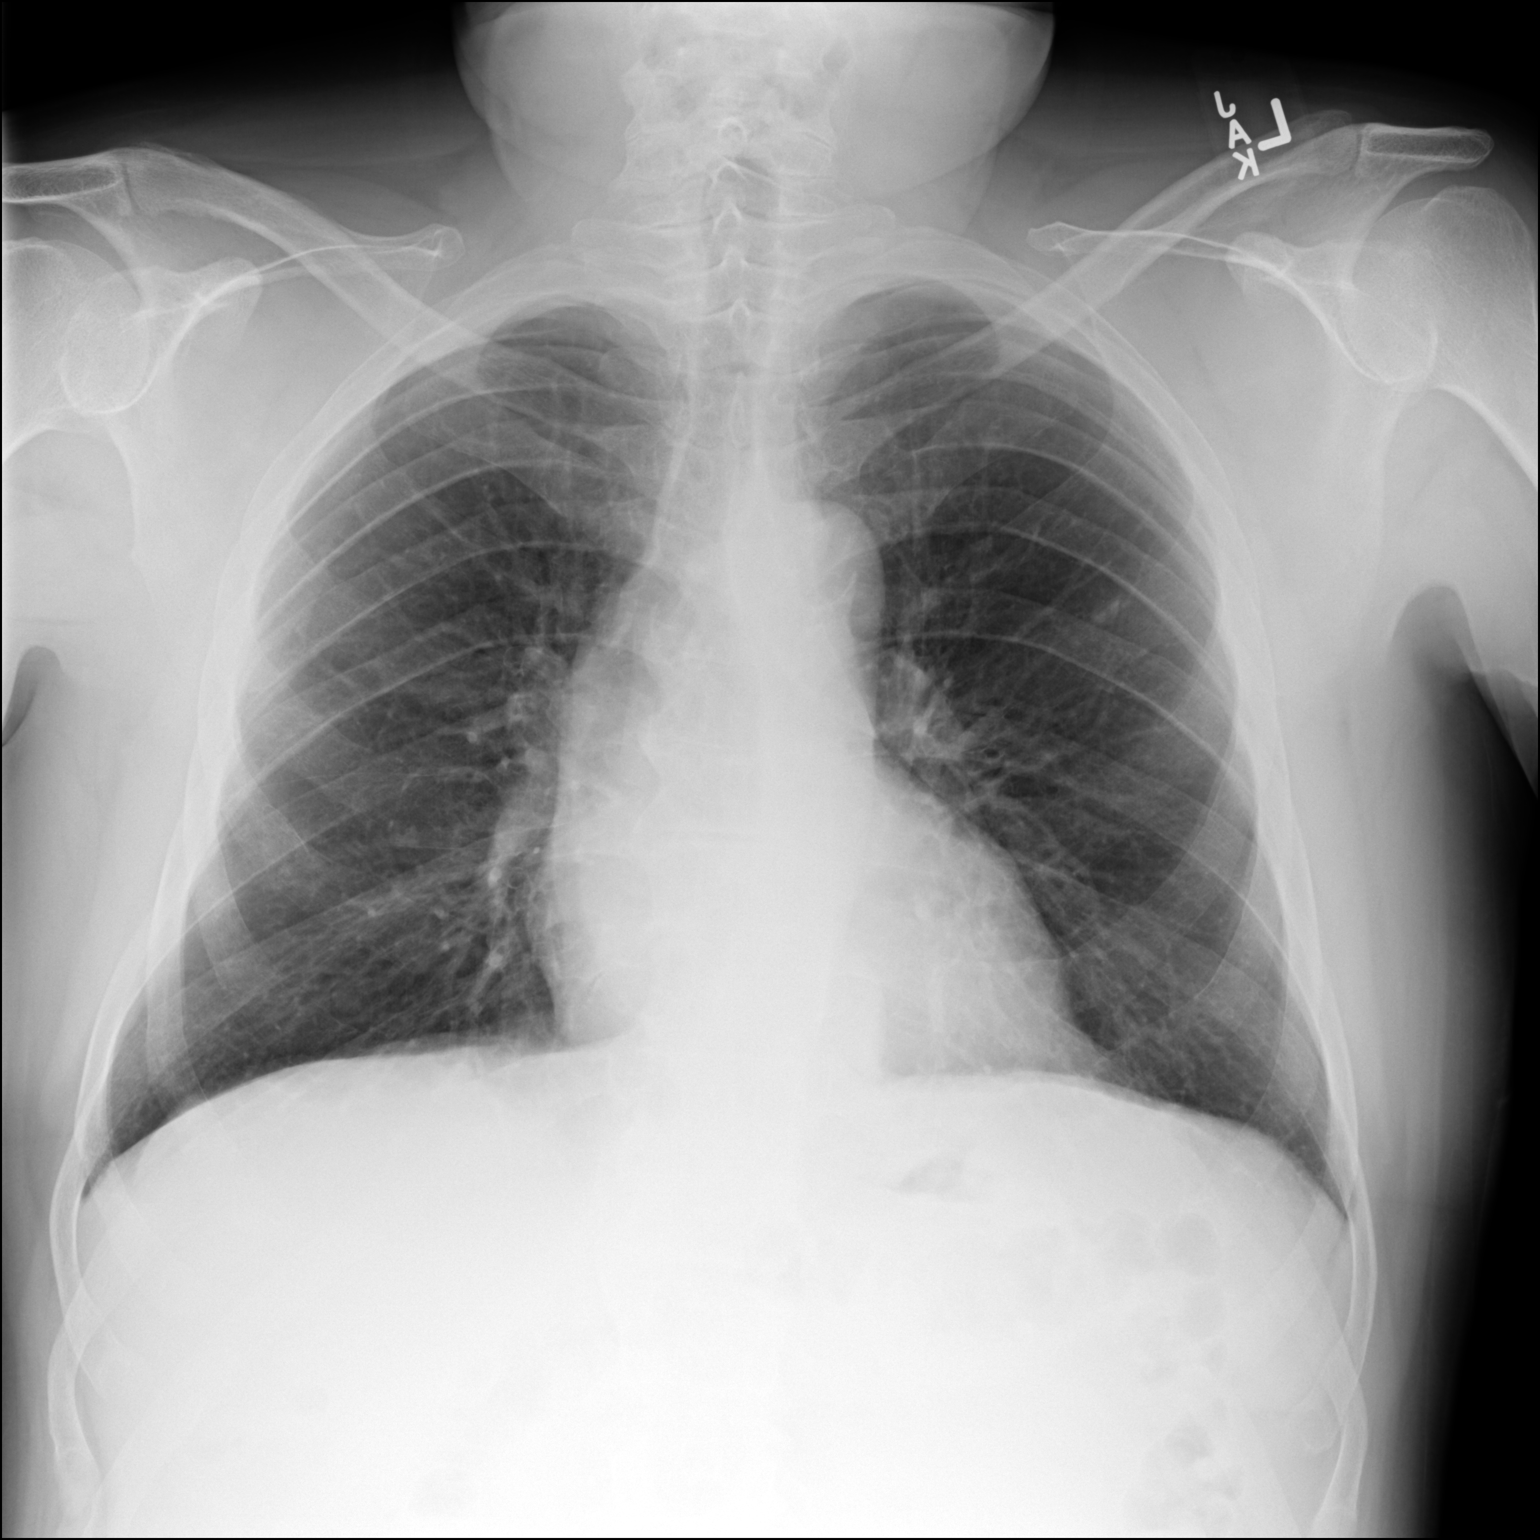

[dg chest 2 view (2 of 2)]
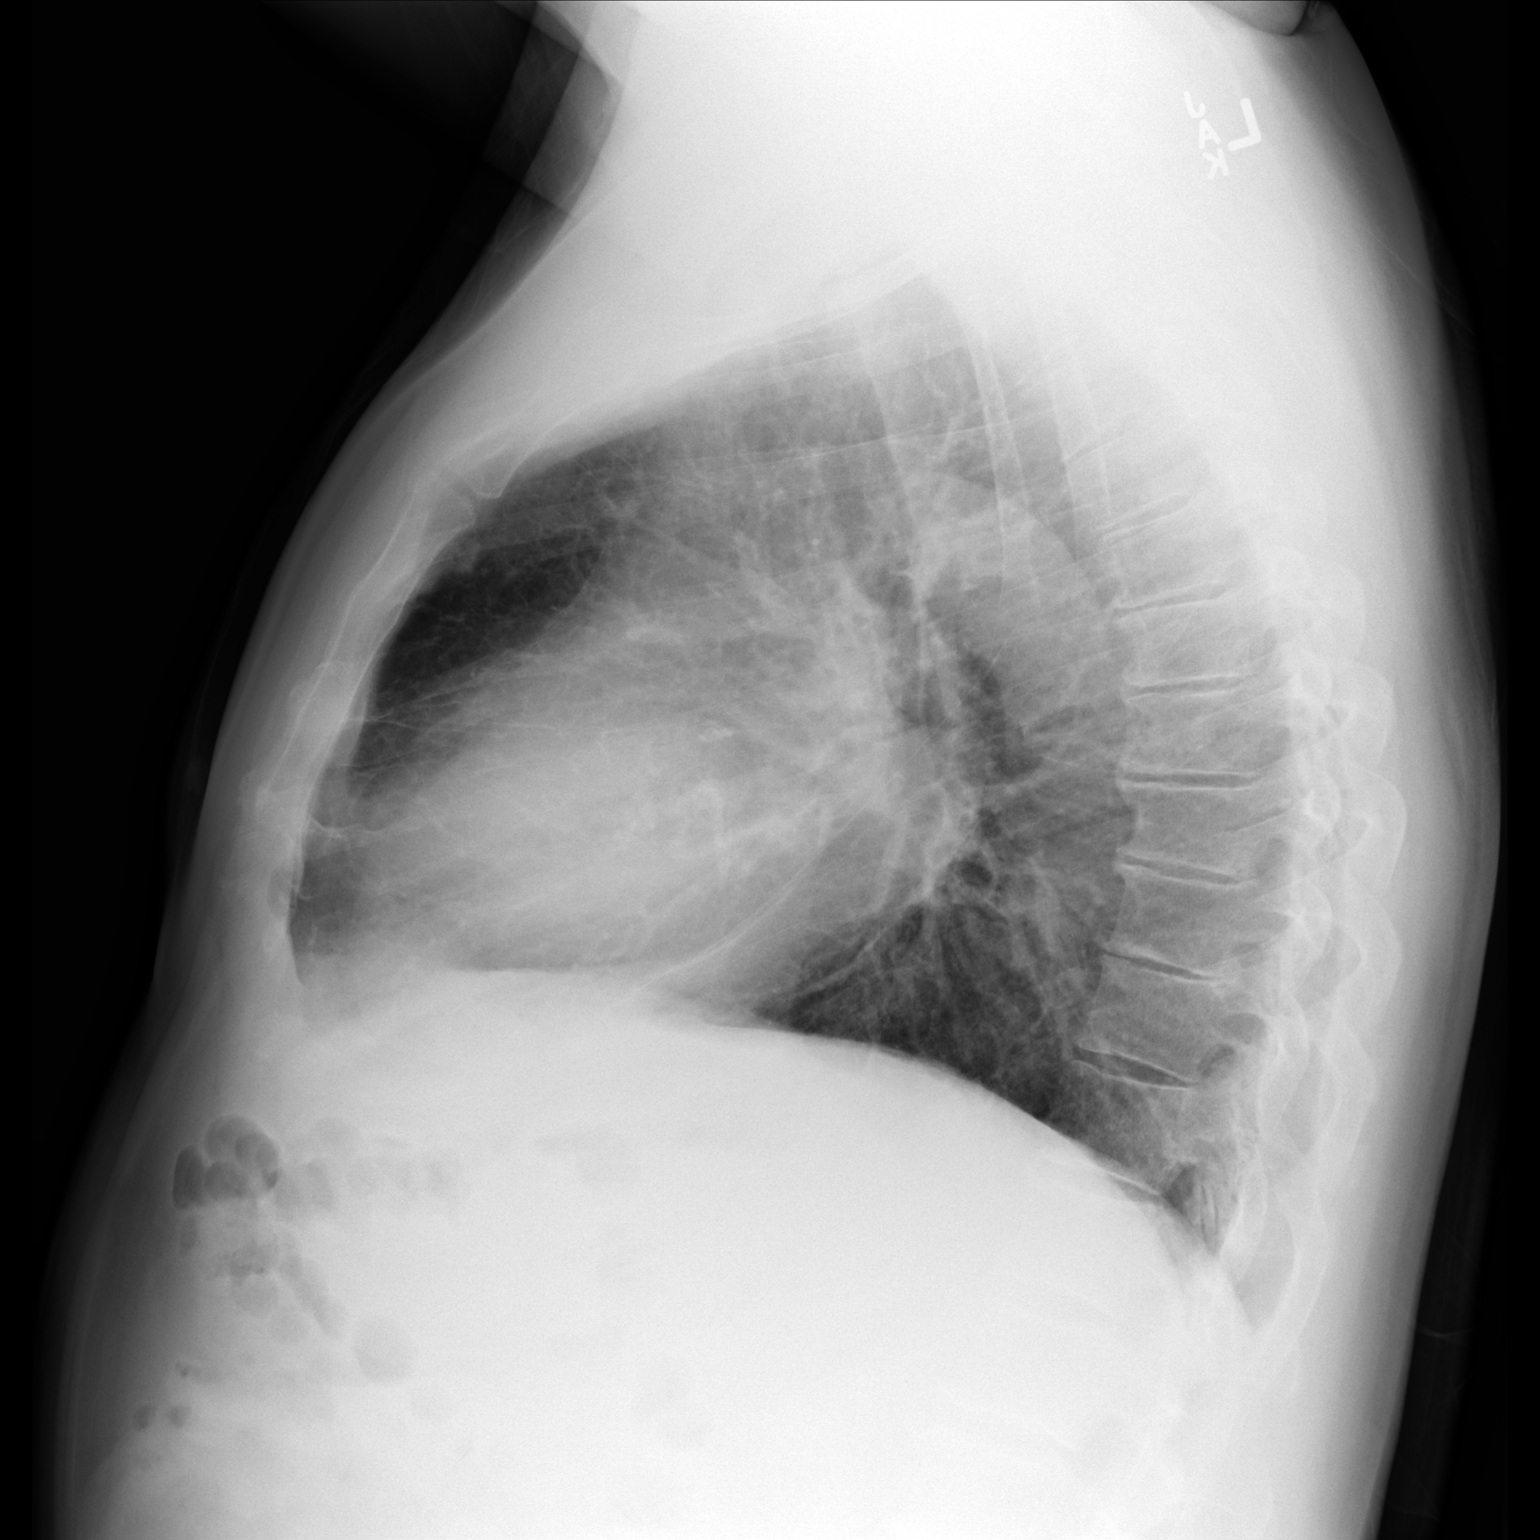

[2 of 2 positions shown; findings below may reference images not displayed]

FINDINGS: Mild hyperinflation. Mild accentuation of expected thoracic
kyphosis. Midline trachea. Normal heart size. Atherosclerosis in the
transverse aorta. No pleural effusion or pneumothorax. Clear lungs.
Increased density projecting over the upper left chest on the
frontal radiograph measures 8 mm and likely represents a calcified
pleural plaque, when correlated with the prior CT.
IMPRESSION: No acute cardiopulmonary disease.

Aortic Atherosclerosis ([id number]).

Asbestos related pleural disease.

## 2019-10-15 ENCOUNTER — Telehealth: Payer: Self-pay

## 2019-10-16 DIAGNOSIS — Z950 Presence of cardiac pacemaker: Secondary | ICD-10-CM | POA: Diagnosis not present

## 2019-10-16 DIAGNOSIS — I442 Atrioventricular block, complete: Secondary | ICD-10-CM | POA: Diagnosis not present

## 2019-10-16 DIAGNOSIS — Z45018 Encounter for adjustment and management of other part of cardiac pacemaker: Secondary | ICD-10-CM | POA: Diagnosis not present

## 2019-10-17 ENCOUNTER — Telehealth: Payer: Self-pay

## 2019-10-17 NOTE — Telephone Encounter (Signed)
Called pt to set up visit with EP, left message asking pt to call the office.

## 2019-10-21 ENCOUNTER — Encounter: Payer: Self-pay | Admitting: Cardiology

## 2019-10-24 ENCOUNTER — Other Ambulatory Visit: Payer: Self-pay

## 2019-10-24 ENCOUNTER — Encounter: Payer: Self-pay | Admitting: Cardiology

## 2019-10-24 ENCOUNTER — Ambulatory Visit (INDEPENDENT_AMBULATORY_CARE_PROVIDER_SITE_OTHER): Payer: Medicare HMO | Admitting: Cardiology

## 2019-10-24 DIAGNOSIS — I442 Atrioventricular block, complete: Secondary | ICD-10-CM

## 2019-10-24 DIAGNOSIS — Z45018 Encounter for adjustment and management of other part of cardiac pacemaker: Secondary | ICD-10-CM | POA: Diagnosis not present

## 2019-10-24 DIAGNOSIS — Z950 Presence of cardiac pacemaker: Secondary | ICD-10-CM | POA: Diagnosis not present

## 2019-10-24 NOTE — Progress Notes (Signed)
Scheduled  In office pacemaker check 10/24/19   Single (S)/Dual (D)/BV: D Presenting ASVP Pacemaker dependant:  Yes. Underlying No escape at 35/min. Longevity 8 Years. Magnet rate: >85%. AP 11%, VP 100%. BP NA% AMS Episodes 0  Since 10/17/19. AT/AF burden <1% Longest 9 min on 12/13/19. Latest 05/04/19 for 3:30 min, EGM suggest AT. HVR 1. Longest 1 sec NSVT. Lead measurements: Stable. Thoracic impedance: NA Histogram: Low (L)/normal (N)/high (H) Normal. Patient activity Good.   Observations: Normal device function Changes: None    ICD-10-CM   1. Encounter for care of pacemaker  Z45.018   2. CHB (complete heart block) (HCC)  I44.2   3. Pacemaker Medtronic Azure XT DR MRI [id number] Dual chamber pacemaker in situ  Z95.0

## 2019-10-25 ENCOUNTER — Telehealth: Payer: Self-pay

## 2019-10-25 DIAGNOSIS — R69 Illness, unspecified: Secondary | ICD-10-CM | POA: Diagnosis not present

## 2019-10-25 NOTE — Telephone Encounter (Signed)
-----   Message from Adrian Prows, MD sent at 10/21/2019  9:01 AM EDT ----- Regarding: Pacemaker Normal function. He has missed several appointments with PCP and also [place]. Ask if he wants to do virtual visit?? Or come in?  Would suggest the same for PCP visits. He needs to see them in view of DM  JG

## 2019-10-25 NOTE — Telephone Encounter (Signed)
Got a hold of pt he will come in for pacer check

## 2019-10-27 IMAGING — CR DG CHEST 2V
2 series · 2 of 2 positions shown · non-contrast
Comparison: Chest x-ray dated 10/04/2017.

CLINICAL DATA: S/P AVR 09/29/17 , pacemaker inserted 10/03/17,
patient still complains of dry cough, CAD, HTN, dm (patient going to
[REDACTED] now)

EXAM:
CHEST  2 VIEW

[w chest pa]
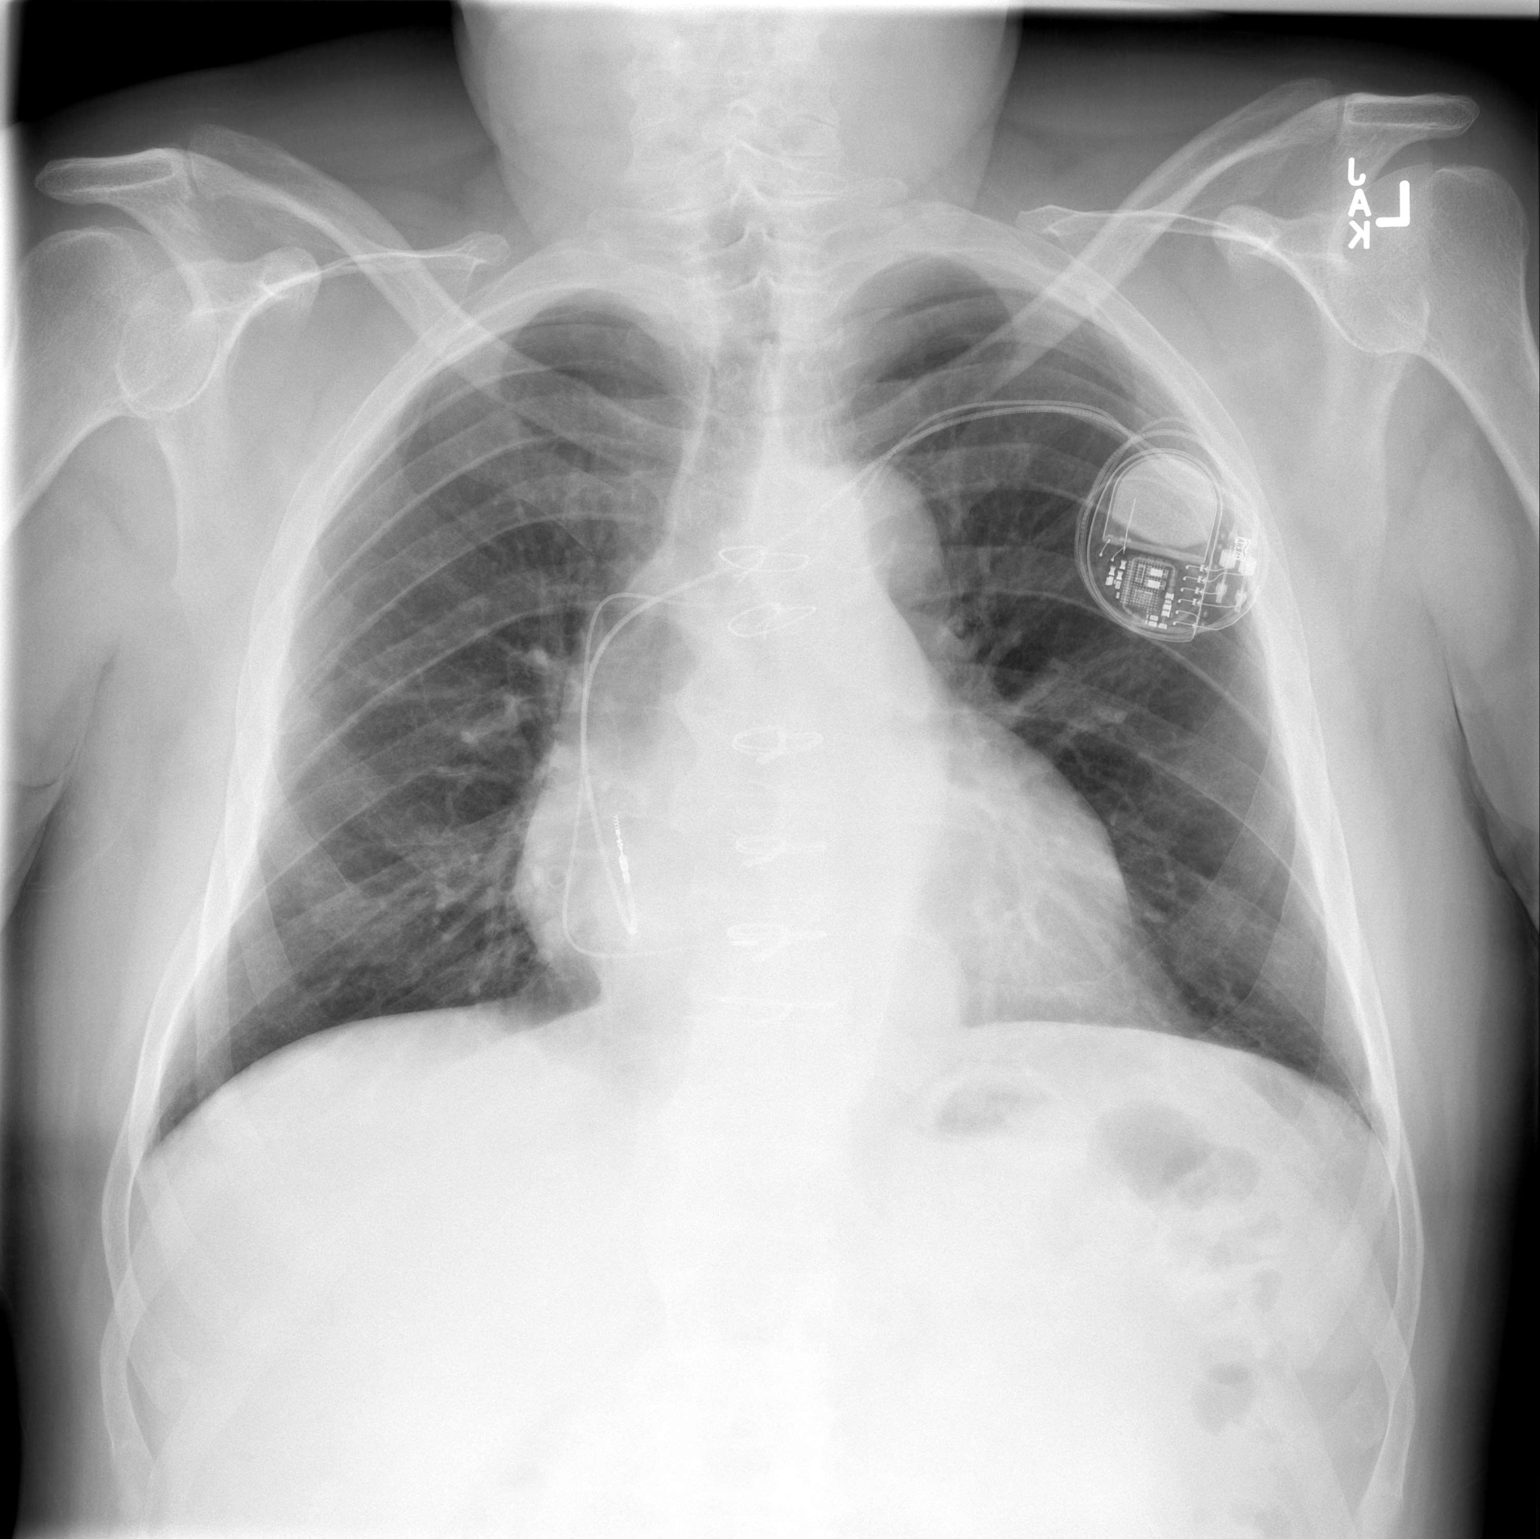

[w chest lat]
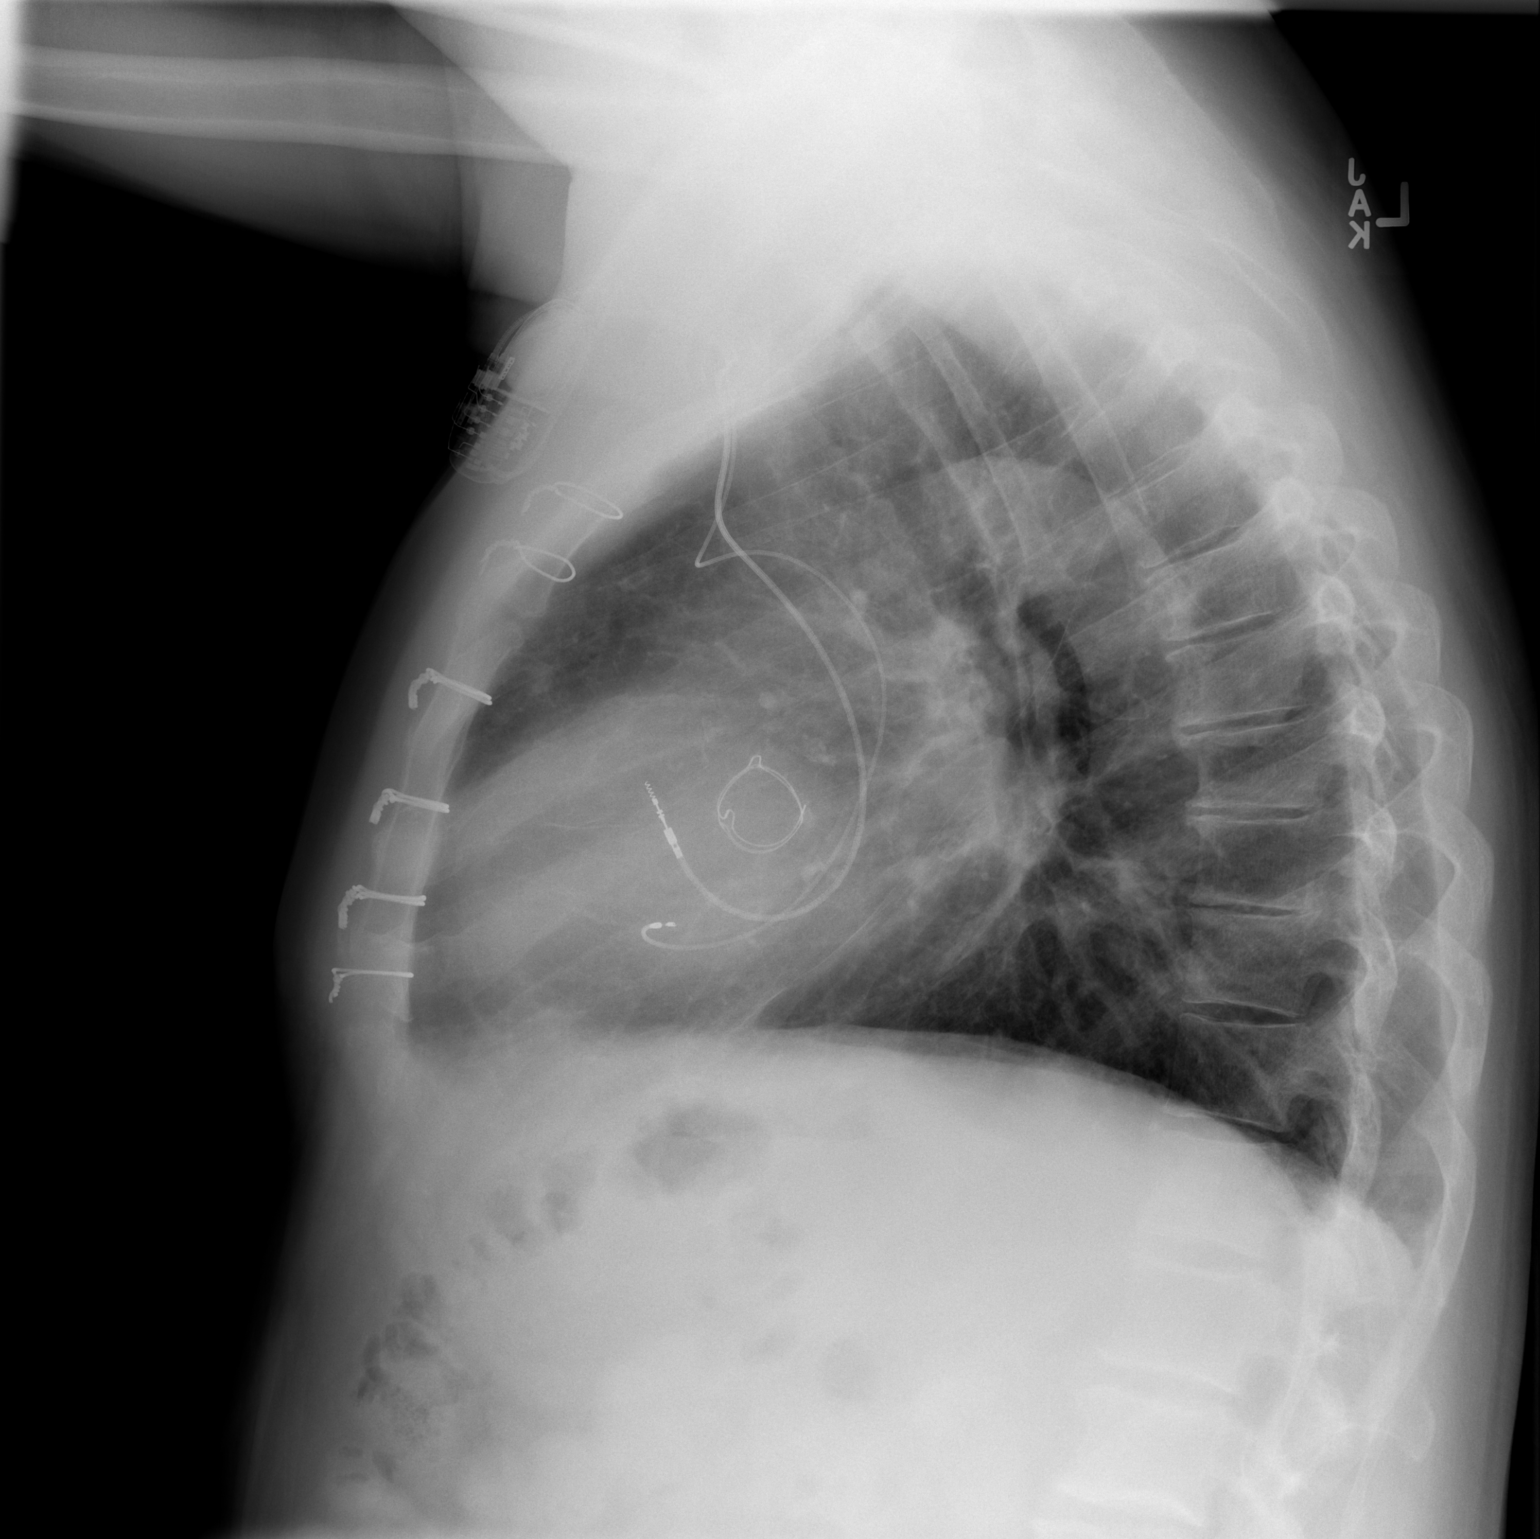

[2 of 2 positions shown; findings below may reference images not displayed]

FINDINGS: Heart size and mediastinal contours are stable. Left chest wall
pacemaker apparatus appear stable in position. Atherosclerotic
changes noted at the aortic arch.

Lungs are clear. No pleural effusion or pneumothorax seen. No acute
or suspicious osseous finding. Mild degenerative spurring within the
thoracic spine.
IMPRESSION: No active cardiopulmonary disease. No evidence of pneumonia or
pulmonary edema.

Aortic atherosclerosis.

## 2019-11-01 DIAGNOSIS — R69 Illness, unspecified: Secondary | ICD-10-CM | POA: Diagnosis not present

## 2019-11-06 ENCOUNTER — Other Ambulatory Visit: Payer: Self-pay

## 2019-11-06 LAB — HM DIABETES EYE EXAM

## 2019-11-13 ENCOUNTER — Other Ambulatory Visit: Payer: Self-pay | Admitting: *Deleted

## 2019-11-13 NOTE — Patient Outreach (Signed)
[facility]) Care Management  11/13/2019  Jonathan Brown 10-20-1951 [id number]   RN Health Coach Initial Assessment  Referral Date:  11/07/2019 Referral Source:  Diabetes Eye Program Reason for Referral:  Disease Management Education Insurance:  [employer]na Medicare   Outreach Attempt:  Outreach attempt #1 to patient for introduction and initial telephone assessment.  Wife answered and stated patient was not home.  HIPAA compliant message left with wife requesting patient return call.   Plan:  RN Health Coach will send Health Coach letter.  RN Health Coach will make another outreach attempt within the next 15 business days.   [place] [phone] [email or web address]

## 2019-11-14 ENCOUNTER — Telehealth: Payer: Self-pay | Admitting: Medical

## 2019-11-14 DIAGNOSIS — R69 Illness, unspecified: Secondary | ICD-10-CM | POA: Diagnosis not present

## 2019-11-14 MED ORDER — LANCETS MISC: 12 refills | Status: DC

## 2019-11-14 NOTE — Telephone Encounter (Signed)
Sent!

## 2019-11-14 NOTE — Telephone Encounter (Signed)
Pt's wife called and states that the incorrect lancets were sent in for him. Please send in one touch VERIO lancets to [facility] [address]

## 2019-11-27 ENCOUNTER — Other Ambulatory Visit: Payer: Self-pay | Admitting: *Deleted

## 2019-11-27 NOTE — Patient Outreach (Signed)
[facility]) Care Management  11/27/2019  Jonathan Brown 1951/05/19 [id number]   RN Health Coach Initial Assessment  Referral Date:  11/07/2019 Referral Source:  Diabetes Eye Program Reason for Referral:  Disease Management Education Insurance:  A[employer]na Medicare   Outreach Attempt:  Successful telephone outreach to patient for introduction and initial telephone assessment.  HIPAA verified with patient.  RN Health Coach introduced self and role.  Confirmed patient completed diabetic eye exam and discussed [facility] Disease Management program.  Patient declining participation in the Disease Management program at this time.  Discussed and reviewed other [facility] services and patient also declines at this time.  Agreeable to receive [facility] Pamphlet mailed to the home.  Encouraged patient to contact [facility] in the future if needs arise.  Plan:  RN Health Coach will send Successful Letter with [facility] Pamphlet.  RN Health Coach will close case and make patient inactive due to patient declining [facility] services.  [place] [phone] [email or web address]

## 2019-12-07 ENCOUNTER — Encounter: Payer: Self-pay | Admitting: Medical

## 2019-12-14 ENCOUNTER — Other Ambulatory Visit: Payer: Self-pay | Admitting: Medical

## 2019-12-17 ENCOUNTER — Ambulatory Visit: Payer: Medicare HMO | Admitting: Cardiology

## 2019-12-17 ENCOUNTER — Encounter: Payer: Self-pay | Admitting: Cardiology

## 2019-12-17 ENCOUNTER — Other Ambulatory Visit: Payer: Self-pay

## 2019-12-17 VITALS — BP 130/82 | HR 84 | Ht 69.0 in | Wt 204.0 lb

## 2019-12-17 DIAGNOSIS — Z952 Presence of prosthetic heart valve: Secondary | ICD-10-CM

## 2019-12-17 DIAGNOSIS — I442 Atrioventricular block, complete: Secondary | ICD-10-CM | POA: Diagnosis not present

## 2019-12-17 DIAGNOSIS — I10 Essential (primary) hypertension: Secondary | ICD-10-CM | POA: Diagnosis not present

## 2019-12-17 DIAGNOSIS — Z950 Presence of cardiac pacemaker: Secondary | ICD-10-CM

## 2019-12-17 DIAGNOSIS — I70213 Atherosclerosis of native arteries of extremities with intermittent claudication, bilateral legs: Secondary | ICD-10-CM | POA: Diagnosis not present

## 2019-12-17 DIAGNOSIS — R0989 Other specified symptoms and signs involving the circulatory and respiratory systems: Secondary | ICD-10-CM | POA: Diagnosis not present

## 2019-12-17 MED ORDER — VALSARTAN-HYDROCHLOROTHIAZIDE 160-12.5 MG PO TABS: 1.0000 | ORAL_TABLET | ORAL | 1 refills | Status: DC

## 2019-12-17 NOTE — Progress Notes (Signed)
Primary Physician/Referring:  Jac Canavan, PA-C  Patient ID: Jonathan Brown, male    DOB: [birth year], 68 y.o.   MRN: [id number]  Chief Complaint  Patient presents with  . Hypertension   HPI:    Jonathan Brown  is a 68 y.o. AAM with aortic valve replacement using 21 mm pericardial Magna Ease valve for severe aortic stenosis 09/29/2017, Medtronic dual-chamber pacemaker for complete heart block status post aortic valve replacement 10/03/2017. He has hypertension, Mixed hyperlipidemia, PAD.   This is a six-month office visit, states that he is doing well and has been exercising regularly, no chest pain or shortness of breath.  States that his diabetes is also controlled.  His gained about 5-10 pounds in weight since Covid 19.  Past Medical History:  Diagnosis Date  . Arthritis   . CHB (complete heart block) (HCC) 10/05/2017  . Diabetes mellitus without complication (HCC) 2014   type 2   . Encounter for care of pacemaker 07/20/2019  . Former smoker   . GERD (gastroesophageal reflux disease)   . Hyperlipidemia   . Hypertension 2012  . Pacemaker Medtronic Azure XT DR MRI [id number] Dual chamber pacemaker in situ 10/03/2017  . PVD (peripheral vascular disease) (HCC)   . S/P AVR    Aortic valve replacement using a 21-mm pericardial Magna Ease (09/29/2017 by Dr. Maren Beach)  . 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 AORTIC VALVE REPLACEMENT N/A 09/29/2017   Procedure: AORTIC VALVE REPLACEMENT (AVR) using 21mm Magna Ease valve;  Surgeon: Kerin Perna, MD;  Location: [facility] OR;  Service: Open Heart Surgery;  Laterality: N/A;  . CARDIAC CATHETERIZATION  07/12/2017  . COLONOSCOPY  2008  . LEFT AND RIGHT HEART CATHETERIZATION WITH CORONARY ANGIOGRAM N/A 04/02/2014   Procedure: LEFT AND RIGHT HEART  CATHETERIZATION WITH CORONARY ANGIOGRAM;  Surgeon: Pamella Pert, MD;  Location: [facility] CATH LAB;  Service: Cardiovascular;  Laterality: N/A;  . PACEMAKER IMPLANT N/A 10/03/2017   Procedure: PACEMAKER IMPLANT;  Surgeon: Marinus Maw, MD;  Location: [facility] INVASIVE CV LAB;  Service: Cardiovascular;  Laterality: N/A;  . TEE WITHOUT CARDIOVERSION N/A 04/30/2014  . TEE WITHOUT CARDIOVERSION N/A 09/29/2017   Procedure: TRANSESOPHAGEAL ECHOCARDIOGRAM (TEE);  Surgeon: Donata Clay, Theron Arista, MD;  Location: [facility] OR;  Service: Open Heart Surgery;  Laterality: N/A;   Social History   Socioeconomic History  . Marital status: Married    Spouse name: Not on file  . Number of children: 0  . Years of education: Not on file  . Highest education level: Not on file  Occupational History  . Not on file  Tobacco Use  . Smoking status: Former Smoker    Packs/day: 0.25    Years: 30.00    Pack years: 7.50    Quit date: 09/28/2007    Years since quitting: 12.2  . Smokeless tobacco: Never Used  Substance and Sexual Activity  . Alcohol use: No  . Drug use: Yes    Frequency: 7.0 times per week    Types: Marijuana    Comment: patient says he smokes marajuana once a day  . Sexual activity: Not on file  Other Topics Concern  . Not on file  Social History Narrative   Married, walks for exercise, step- son lives  in [place].  05/2019   Social Determinants of Health   Financial Resource Strain:   . Difficulty of Paying Living Expenses: Not on file  Food Insecurity:   . Worried About Charity fundraiser in the Last Year: Not on file  . Ran Out of Food in the Last Year: Not on file  Transportation Needs:   . Lack of Transportation (Medical): Not on file  . Lack of Transportation (Non-Medical): Not on file  Physical Activity:   . Days of Exercise per Week: Not on file  . Minutes of Exercise per Session: Not on file  Stress:   . Feeling of Stress : Not on file  Social Connections:   . Frequency of Communication  with Friends and Family: Not on file  . Frequency of Social Gatherings with Friends and Family: Not on file  . Attends Religious Services: Not on file  . Active Member of Clubs or Organizations: Not on file  . Attends Archivist Meetings: Not on file  . Marital Status: Not on file  Intimate Partner Violence:   . Fear of Current or Ex-Partner: Not on file  . Emotionally Abused: Not on file  . Physically Abused: Not on file  . Sexually Abused: Not on file   ROS  Review of Systems  Constitution: Negative for chills, decreased appetite, malaise/fatigue and weight gain.  Cardiovascular: Positive for claudication (right leg is worse than left, stable). Negative for dyspnea on exertion, leg swelling and syncope.  Endocrine: Negative for cold intolerance.  Hematologic/Lymphatic: Does not bruise/bleed easily.  Musculoskeletal: Negative for joint swelling.  Gastrointestinal: Negative for abdominal pain, anorexia, change in bowel habit, hematochezia and melena.  Neurological: Negative for headaches and light-headedness.  Psychiatric/Behavioral: Negative for depression and substance abuse.  All other systems reviewed and are negative.  Objective  Blood pressure 130/82, pulse 84, height 5\' 9"  (1.753 m), weight 204 lb (92.5 kg), SpO2 98 %.  Vitals with BMI 12/17/2019 06/18/2019 05/16/2019  Height 5\' 9"  5\' 9"  5\' 9"   Weight 204 lbs 195 lbs 3 oz 190 lbs  BMI 30.11 25.85 27.78  Systolic 242 353 -  Diastolic 82 80 -  Pulse 84 74 -     Physical Exam  Constitutional: He appears well-developed and well-nourished. No distress.  HENT:  Head: Atraumatic.  Eyes: Conjunctivae are normal.  Neck: No thyromegaly present.  Cardiovascular: Normal rate and regular rhythm. Exam reveals no gallop.  Murmur heard.  Harsh midsystolic murmur is present with a grade of 3/6 radiating to the neck. Pulses:      Carotid pulses are 2+ on the right side with bruit and 2+ on the left side with bruit.       Femoral pulses are 2+ on the right side and 2+ on the left side.      Popliteal pulses are 1+ on the right side and 2+ on the left side.       Dorsalis pedis pulses are 0 on the right side and 0 on the left side.       Posterior tibial pulses are 0 on the right side and 0 on the left side.  No leg edema, Skin is shiny and hair loss noted.  No JVD.  Pulmonary/Chest: Effort normal and breath sounds normal.  Sternotomy scar noted with keloid formation. Pacemaker/ICD site noted  in the left infraclavicular fossa.    Abdominal: Soft. Bowel sounds are normal.  Musculoskeletal:        General:  Normal range of motion.     Cervical back: Neck supple.  Neurological: He is alert.  Skin: Skin is warm and dry.  Psychiatric: He has a normal mood and affect.   Laboratory examination:   Recent Labs    06/18/19 0938  NA 142  K 4.0  CL 108*  CO2 20  GLUCOSE 126*  BUN 12  CREATININE 1.04  CALCIUM 9.9  GFRNONAA 74  GFRAA 85   CrCl cannot be calculated (Patient's most recent lab result is older than the maximum 21 days allowed.).  CMP Latest Ref Rng & Units 06/18/2019 06/15/2018 02/14/2018  Glucose 65 - 99 mg/dL 696(E) 952(W) 413(K)  BUN 8 - 27 mg/dL Creatinine 0.76 - 1.27 mg/dL 4.40 1.02 7.25  Sodium 134 - 144 mmol/L 142 143 141  Potassium 3.5 - 5.2 mmol/L 4.0 4.2 4.4  Chloride 96 - 106 mmol/L 108(H) 107(H) 106  CO2 20 - 29 mmol/L Calcium 8.6 - 10.2 mg/dL 9.9 36.6 44.0  Total Protein 6.0 - 8.5 g/dL 6.8 6.9 6.8  Total Bilirubin 0.0 - 1.2 mg/dL 0.5 0.5 0.6  Alkaline Phos 39 - 117 IU/L 100 95 98  AST 0 - 40 IU/L ALT 0 - 44 IU/L 31 36 22   CBC Latest Ref Rng & Units 06/18/2019 02/14/2018 10/05/2017  WBC 3.4 - 10.8 x10E3/uL 5.7 5.6 11.7(H)  Hemoglobin 13.0 - 17.7 g/dL 34.7 42.5 9.5(G)  Hematocrit 37.5 - 51.0 % 39.3 40.5 30.5(L)  Platelets 150 - 450 x10E3/uL 166 195 177   Lipid Panel     Component Value Date/Time   CHOL 134 06/18/2019 0938   TRIG 126  06/18/2019 0938   HDL 38 (L) 06/18/2019 0938   CHOLHDL 3.5 06/18/2019 0938   CHOLHDL 4.3 06/14/2017 0835   VLDL 16 06/14/2017 0835   LDLCALC 71 06/18/2019 0938   HEMOGLOBIN A1C Lab Results  Component Value Date   HGBA1C 5.8 (H) 06/18/2019   MPG 99.67 09/27/2017   TSH No results for input(s): TSH in the last 8760 hours.  Medications and allergies  No Known Allergies   Current Outpatient Medications  Medication Instructions  . acetaminophen (TYLENOL) 1,000 mg, Oral, Daily PRN  . amLODipine (NORVASC) 10 mg, Oral, Daily  . aspirin EC 81 mg, Oral, Daily  . glucose blood test strip 100 each, Other, Daily, Use as instructed   Dispense 100 box   12 refills   . Lancets (ONETOUCH ULTRASOFT) lancets Use as instructed  . Lancets MISC Use as instructed. Needs lancets based on pt's meter. Needs one touch verio lancets  . metFORMIN (GLUCOPHAGE) 500 MG tablet TAKE ONE TABLET BY MOUTH TWICE DAILY WITH  MEAL  . metoprolol tartrate (LOPRESSOR) 50 mg, Oral, 2 times daily  . omeprazole (PRILOSEC) 40 mg, Oral, Daily  . rosuvastatin (CRESTOR) 20 mg, Oral, Daily at bedtime  . Tetrahydrozoline HCl (VISINE OP) 1 drop, Ophthalmic, Daily PRN  . valsartan-hydrochlorothiazide (DIOVAN HCT) 160-12.5 MG tablet 1 tablet, Oral, BH-each morning  . vitamin B-12 (CYANOCOBALAMIN) 50 mcg, Oral, Daily  . Vitamin D, Cholecalciferol, 10 MCG (400 UNIT) CAPS Oral, Daily  . Vitamin E 100 units TABS Oral, Daily   Radiology:  No results found.  Cardiac Studies:   Lower extremity arterial duplex 07/06/2017 No hemodynamically significant stenoses are identified in the right lower extremity arterial system. Significant velocity increase at the left distal superficial femoral artery suggestive of >50% stenosis. This exam reveals moderately  decreased perfusion of the lower extremity, RABI 0.76 and LABI 0.76. 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 Compared to the study done on 03/06/2014, bilateral ABI has decreased from normal. No  change in the left mid SFA stenosis.  Echocardiogram 07/06/2017: Left ventricle cavity is normal in size. Moderate concentric hypertrophy of the left ventricle. Normal global wall motion. Doppler evidence of grade II (pseudonormal) diastolic dysfunction, elevated LAP. Calculated EF 59%. Mild calcification of the aortic valve annulus.  Moderate aortic valve leaflet calcification. Severely restricted aortic valve leaflets. Severe aortic valve stenosis. Aortic valve peak pressure gradient of 76 and mean gradient of  47  mmHg, calculated aortic valve area  0.65 cm. Mild (Grade I) mitral regurgitation. Mild to moderate tricuspid regurgitation. Moderate pulmonary hypertension. Pulmonary artery systolic pressure is estimated at 46 mm Hg. IVC is dilated with poor inspiration collapse consistent with elevated right atrial pressure. Compared to the study done on 02/24/2016, no significant change in the study.  Right and left heart catheterization 07/12/2017:  Normal to mild Mild elevation in pulmonary artery pressure, normal LVEDP, PA pressure 35/12, mean 23 mmHg. Mild noncritical coronary artery disease, ostial ramus 30% calcific stenosis, otherwise 10-20% luminal irregularity in all 3 vessels. Mild disease in the RCA. Aortic valve could not be crossed. Heavily calcified, mild aortic root dilatation and AV appears to be bicuspid.  Assessment     ICD-10-CM   1. Atherosclerosis of native artery of both lower extremities with intermittent claudication (HCC)  I70.213 EKG 12-Lead  2. S/P AVR with 21 mm Magna Ease Pericardial Tissue Valve,  09/18/2017: Karsten Ro, MD  Z95.2 PCV ECHOCARDIOGRAM COMPLETE  3. Complete heart block (HCC)  I44.2   4. Pacemaker Medtronic Azure XT DR MRI [id number] Dual chamber pacemaker in situ  Z95.0   5. Essential hypertension  I10 PCV ECHOCARDIOGRAM COMPLETE    valsartan-hydrochlorothiazide (DIOVAN HCT) 160-12.5 MG tablet  6. Bilateral carotid bruits  R09.89 PCV CAROTID DUPLEX  (BILATERAL)    EKG 12/17/2019: Sinus rhythm with first-degree AV block at the rate of 83 bpm, possible left atrial abnormality, ventricularly paced rhythm.  No further analysis. No significant change from  EKG 01/31/2019   Remote pacemaker check  10/17/2019: There was a 0 % cumulative atrial arrhythmia burden. There was 1 high ventricular rate episode detected. EGM shows 6 bts of NSVT. Health trends do not demonstrate significant abnormality.  Battery longevity is 8.1-8.9 years. RA pacing is 10.1 %, RV pacing is 99.9 %.  Scheduled  In office pacemaker check 10/24/19  Single (S)/Dual (D)/BV: D Presenting ASVP Pacemaker dependant:  Yes. Underlying No escape at 35/min. Longevity 8 Years. Magnet rate: >85%. AP 11%, VP 100%. BP NA% AMS Episodes 0  Since 10/17/19. AT/AF burden <1% Longest 9 min on 12/13/19. Latest 05/04/19 for 3:30 min, EGM suggest AT. HVR 1. Longest 1 sec NSVT. Lead measurements: Stable. Thoracic impedance: NA Histogram: Low (L)/normal (N)/high (H) Normal. Patient activity Good.   Observations: Normal device function. Changes: None  Recommendations:   Meds ordered this encounter  Medications  . valsartan-hydrochlorothiazide (DIOVAN HCT) 160-12.5 MG tablet    Sig: Take 1 tablet by mouth every morning.    Dispense:  90 tablet    Refill:  1    Discontinue plain valsartan 80 mg and also HCTZ 12.5 mg    BRADYN SOWARD  is a 68 y.o. male  status post aortic valve replacement using 21 mm pericardial Magna Ease valve for severe aortic stenosis 09/29/2017, Medtronic dual-chamber pacemaker for complete heart  block status post aortic valve replacement 10/03/2017. He has hypertension, Mixed hyperlipidemia, PAD.   With regard to peripheral arterial disease, Symptoms are remained stable, there is no critical limb ischemia.  Capillary refill is normal bilaterally.  Blood pressure is controlled although would like to have diastolic blood pressure closer to 70 mmHg, will discontinue  plain HCTZ and also plain valsartan 80 mg daily and switching to valsartan HCT 160/12.5 mg in the morning.  Lipids are well controlled, weight loss was discussed with the patient.  He has not had based on echocardiogram since aortic valve replacement and also I hear systolic ejection murmur, hence will obtain an echocardiogram.  We'll also obtain a carotid artery duplex for carotid bruit.  Unless abnormal, I will see him back in 6 months.  Pacemaker is functioning normally.  He'll continue remote monitoring.  Yates DecampJay Genoveva Singleton, MD, Pres[facility] Saint Joseph HospitalFACC 12/17/2019, 11:44 AM [facility]. [place] Pager: [phone] Office: [phone]

## 2019-12-17 NOTE — Patient Instructions (Signed)
Take valsartan 80 mg that you have at home, 2 tablets daily until we were done.  Once you are done with Valsartan tablets at home, discontinue both hydrochlorothiazide and plain Valsartan and a new prescription has been sent for valsartan HCT 160/12.5 mg to be taken every morning.

## 2020-01-17 ENCOUNTER — Other Ambulatory Visit: Payer: Self-pay

## 2020-01-17 DIAGNOSIS — I10 Essential (primary) hypertension: Secondary | ICD-10-CM

## 2020-01-17 MED ORDER — AMLODIPINE BESYLATE 10 MG PO TABS: 10.0000 mg | ORAL_TABLET | Freq: Every day | ORAL | 0 refills | Status: DC

## 2020-02-26 DIAGNOSIS — H524 Presbyopia: Secondary | ICD-10-CM | POA: Diagnosis not present

## 2020-02-26 DIAGNOSIS — H52203 Unspecified astigmatism, bilateral: Secondary | ICD-10-CM | POA: Diagnosis not present

## 2020-02-26 DIAGNOSIS — H401112 Primary open-angle glaucoma, right eye, moderate stage: Secondary | ICD-10-CM | POA: Diagnosis not present

## 2020-02-26 DIAGNOSIS — E1136 Type 2 diabetes mellitus with diabetic cataract: Secondary | ICD-10-CM | POA: Diagnosis not present

## 2020-02-26 DIAGNOSIS — H401123 Primary open-angle glaucoma, left eye, severe stage: Secondary | ICD-10-CM | POA: Diagnosis not present

## 2020-02-26 DIAGNOSIS — H25813 Combined forms of age-related cataract, bilateral: Secondary | ICD-10-CM | POA: Diagnosis not present

## 2020-02-26 LAB — HM DIABETES EYE EXAM

## 2020-03-11 ENCOUNTER — Encounter: Payer: Self-pay | Admitting: Medical

## 2020-03-11 DIAGNOSIS — H401112 Primary open-angle glaucoma, right eye, moderate stage: Secondary | ICD-10-CM | POA: Diagnosis not present

## 2020-04-13 ENCOUNTER — Other Ambulatory Visit: Payer: Self-pay | Admitting: Cardiology

## 2020-04-13 DIAGNOSIS — I10 Essential (primary) hypertension: Secondary | ICD-10-CM

## 2020-04-15 ENCOUNTER — Other Ambulatory Visit: Payer: Self-pay | Admitting: Cardiology

## 2020-04-15 DIAGNOSIS — I10 Essential (primary) hypertension: Secondary | ICD-10-CM

## 2020-06-02 ENCOUNTER — Ambulatory Visit: Payer: Medicare HMO

## 2020-06-02 ENCOUNTER — Other Ambulatory Visit: Payer: Self-pay

## 2020-06-02 DIAGNOSIS — R0989 Other specified symptoms and signs involving the circulatory and respiratory systems: Secondary | ICD-10-CM

## 2020-06-02 DIAGNOSIS — I10 Essential (primary) hypertension: Secondary | ICD-10-CM | POA: Diagnosis not present

## 2020-06-02 DIAGNOSIS — Z952 Presence of prosthetic heart valve: Secondary | ICD-10-CM

## 2020-06-03 NOTE — Progress Notes (Signed)
Normal function of the prosthetic aortic valve. Normal LV systolic function

## 2020-06-05 NOTE — Progress Notes (Signed)
Minn[place] plaque. NO stenosis in carotid. Discuss on OV

## 2020-06-11 DIAGNOSIS — H401112 Primary open-angle glaucoma, right eye, moderate stage: Secondary | ICD-10-CM | POA: Diagnosis not present

## 2020-06-16 ENCOUNTER — Ambulatory Visit: Payer: Medicare HMO | Admitting: Cardiology

## 2020-06-16 ENCOUNTER — Encounter: Payer: Self-pay | Admitting: Cardiology

## 2020-06-16 ENCOUNTER — Other Ambulatory Visit: Payer: Self-pay

## 2020-06-16 VITALS — BP 116/67 | HR 65 | Resp 16 | Ht 69.0 in | Wt 210.6 lb

## 2020-06-16 DIAGNOSIS — Z952 Presence of prosthetic heart valve: Secondary | ICD-10-CM

## 2020-06-16 DIAGNOSIS — I10 Essential (primary) hypertension: Secondary | ICD-10-CM

## 2020-06-16 DIAGNOSIS — Z950 Presence of cardiac pacemaker: Secondary | ICD-10-CM

## 2020-06-16 DIAGNOSIS — I70213 Atherosclerosis of native arteries of extremities with intermittent claudication, bilateral legs: Secondary | ICD-10-CM

## 2020-06-16 NOTE — Progress Notes (Signed)
Primary Physician/Referring:  Jac Canavan, PA-C  Patient ID: Jonathan Brown, male    DOB: 04-Apr-1951, 69 y.o.   MRN: [id number]  Chief Complaint  Patient presents with  . PAD  . Hypertension  . Aortic valve disease  . Follow-up    6 month   HPI:    Jonathan Brown  is a 69 y.o. AAM with aortic valve replacement using 21 mm pericardial Magna Ease valve for severe aortic stenosis 09/29/2017, Medtronic dual-chamber pacemaker for complete heart block status post aortic valve replacement 10/03/2017. He has hypertension, Mixed hyperlipidemia, PAD.   This is a six-month office visit, states that he is doing well and has been exercising regularly, no chest pain or shortness of breath.  States that his diabetes is also controlled.  His gained about 5-10 pounds in weight since Covid 19.  Past Medical History:  Diagnosis Date  . Arthritis   . CHB (complete heart block) (HCC) 10/05/2017  . Diabetes mellitus without complication (HCC) 2014   type 2   . Encounter for care of pacemaker 07/20/2019  . Former smoker   . GERD (gastroesophageal reflux disease)   . Hyperlipidemia   . Hypertension 2012  . Pacemaker Medtronic Azure XT DR MRI [id number] Dual chamber pacemaker in situ 10/03/2017  . PVD (peripheral vascular disease) (HCC)   . S/P AVR    Aortic valve replacement using a 21-mm pericardial Magna Ease (09/29/2017 by Dr. Maren Beach)  . 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 AORTIC VALVE REPLACEMENT N/A 09/29/2017   Procedure: AORTIC VALVE REPLACEMENT (AVR) using 35mm Magna Ease valve;  Surgeon: Kerin Perna, MD;  Location: [facility] OR;  Service: Open Heart Surgery;  Laterality: N/A;  . CARDIAC CATHETERIZATION  07/12/2017  . COLONOSCOPY  2008  . LEFT AND RIGHT HEART CATHETERIZATION WITH CORONARY ANGIOGRAM  N/A 04/02/2014   Procedure: LEFT AND RIGHT HEART CATHETERIZATION WITH CORONARY ANGIOGRAM;  Surgeon: Pamella Pert, MD;  Location: [facility] CATH LAB;  Service: Cardiovascular;  Laterality: N/A;  . PACEMAKER IMPLANT N/A 10/03/2017   Procedure: PACEMAKER IMPLANT;  Surgeon: Marinus Maw, MD;  Location: [facility] INVASIVE CV LAB;  Service: Cardiovascular;  Laterality: N/A;  . TEE WITHOUT CARDIOVERSION N/A 04/30/2014  . TEE WITHOUT CARDIOVERSION N/A 09/29/2017   Procedure: TRANSESOPHAGEAL ECHOCARDIOGRAM (TEE);  Surgeon: Donata Clay, Theron Arista, MD;  Location: [facility] OR;  Service: Open Heart Surgery;  Laterality: N/A;   Social History   Socioeconomic History  . Marital status: Married    Spouse name: Not on file  . Number of children: 0  . Years of education: Not on file  . Highest education level: Not on file  Occupational History  . Not on file  Tobacco Use  . Smoking status: Former Smoker    Packs/day: 0.25    Years: 30.00    Pack years: 7.50    Quit date: 09/28/2007    Years since quitting: 12.7  . Smokeless tobacco: Never Used  Vaping Use  . Vaping Use: Never used  Substance and Sexual Activity  . Alcohol use: No  . Drug use: Yes    Frequency: 7.0 times per week    Types: Marijuana    Comment: patient says he smokes marajuana once a day  . Sexual activity:  Not on file  Other Topics Concern  . Not on file  Social History Narrative   Married, walks for exercise, step- [place] in [place], [place]  05/2019   Social Determinants of Health   Financial Resource Strain:   . Difficulty of Paying Living Expenses:   Food Insecurity:   . Worried About Programme researcher, broadcasting/film/videounning Out of Food in the Last Year:   . Baristaan Out of Food in the Last Year:   Transportation Needs:   . Freight forwarderLack of Transportation (Medical):   Marland Kitchen. Lack of Transportation (Non-Medical):   Physical Activity:   . Days of Exercise per Week:   . Minutes of Exercise per Session:   Stress:   . Feeling of Stress :   Social Connections:   . Frequency of Communication  with Friends and Family:   . Frequency of Social Gatherings with Friends and Family:   . Attends Religious Services:   . Active Member of Clubs or Organizations:   . Attends BankerClub or Organization Meetings:   Marland Kitchen. Marital Status:   Intimate Partner Violence:   . Fear of Current or Ex-Partner:   . Emotionally Abused:   Marland Kitchen. Physically Abused:   . Sexually Abused:    ROS  Review of Systems  Cardiovascular: Positive for claudication. Negative for chest pain, dyspnea on exertion and leg swelling.  Gastrointestinal: Negative for melena.   Objective  Blood pressure 116/67, pulse 65, resp. rate 16, height 5\' 9"  (1.753 m), weight 210 lb 9.6 oz (95.5 kg), SpO2 97 %.  Vitals with BMI 06/16/2020 12/17/2019 06/18/2019  Height 5\' 9"  5\' 9"  5\' 9"   Weight 210 lbs 10 oz 204 lbs 195 lbs 3 oz  BMI 31.09 30.11 28.81  Systolic 116 130 [id number]  Diastolic 67 82 80  Pulse 65 84 74     Physical Exam Constitutional:      General: He is not in acute distress.    Appearance: He is well-developed.  Neck:     Thyroid: No thyromegaly.  Cardiovascular:     Rate and Rhythm: Normal rate and regular rhythm.     Pulses:          Carotid pulses are 2+ on the right side with bruit and 2+ on the left side with bruit.      Femoral pulses are 2+ on the right side and 2+ on the left side.      Popliteal pulses are 1+ on the right side and 1+ on the left side.       Dorsalis pedis pulses are 0 on the right side and 0 on the left side.       Posterior tibial pulses are 2+ on the right side and 2+ on the left side.     Heart sounds: Murmur heard.  Harsh midsystolic murmur is present with a grade of 2/6 radiating to the neck.  No gallop.      Comments: No leg edema, Skin is shiny and hair loss noted.  No JVD. Pulmonary:     Effort: Pulmonary effort is normal.     Breath sounds: Normal breath sounds.  Abdominal:     General: Bowel sounds are normal.     Palpations: Abdomen is soft.    Laboratory examination:   Recent  Labs    06/18/19 0938  NA 142  K 4.0  CL 108*  CO2 20  GLUCOSE 126*  BUN 12  CREATININE 1.04  CALCIUM 9.9  GFRNONAA 74  GFRAA 85  CrCl cannot be calculated (Patient's most recent lab result is older than the maximum 21 days allowed.).  CMP Latest Ref Rng & Units 06/18/2019 06/15/2018 02/14/2018  Glucose 65 - 99 mg/dL 768(G) 881(J) 031(R)  BUN 8 - 27 mg/dL 12 13 13   Creatinine 0.76 - 1.27 mg/dL 9.45 8.59  Sodium 134 - 144 mmol/L 142 143 141  Potassium 3.5 - 5.2 mmol/L 4.0 4.2 4.4  Chloride 96 - 106 mmol/L 108(H) 107(H) 106  CO2 20 - 29 mmol/L 20 21 21   Calcium 8.6 - 10.2 mg/dL 9.9 2.92  Total Protein 6.0 - 8.5 g/dL 6.8 6.9 6.8  Total Bilirubin 0.0 - 1.2 mg/dL 0.5 0.5 0.6  Alkaline Phos 39 - 117 IU/L 100 95 98  AST 0 - 40 IU/L 26 25 16   ALT 0 - 44 IU/L 31 36 22   CBC Latest Ref Rng & Units 06/18/2019 02/14/2018 10/05/2017  WBC 3.4 - 10.8 x10E3/uL 5.7 5.6 11.7(H)  Hemoglobin 13.0 - 17.7 g/dL 06/20/2019 02/16/2018 12/05/2017)  Hematocrit 37.5 - 51.0 % 39.3 40.5 30.5(L)  Platelets 150 - 450 x10E3/uL 166 195 177   Lipid Panel     Component Value Date/Time   CHOL 134 06/18/2019 0938   TRIG 126 06/18/2019 0938   HDL 38 (L) 06/18/2019 0938   CHOLHDL 3.5 06/18/2019 0938   CHOLHDL 4.3 06/14/2017 0835   VLDL 16 06/14/2017 0835   LDLCALC 71 06/18/2019 0938   HEMOGLOBIN A1C Lab Results  Component Value Date   HGBA1C 5.8 (H) 06/18/2019   MPG 99.67 09/27/2017   TSH No results for input(s): TSH in the last 8760 hours.  Medications and allergies  No Known Allergies   Current Outpatient Medications  Medication Instructions  . acetaminophen (TYLENOL) 1,000 mg, Oral, Daily PRN  . amLODipine (NORVASC) 10 MG tablet TAKE 1 TABLET BY MOUTH EVERY DAY  . aspirin EC 81 mg, Oral, Daily  . glucose blood test strip 100 each, Other, Daily, Use as instructed   Dispense 100 box   12 refills   . Lancets (ONETOUCH ULTRASOFT) lancets Use as instructed  . Lancets MISC Use as instructed. Needs  lancets based on pt's meter. Needs one touch verio lancets  . metFORMIN (GLUCOPHAGE) 500 MG tablet TAKE ONE TABLET BY MOUTH TWICE DAILY WITH  MEAL  . metoprolol tartrate (LOPRESSOR) 50 mg, Oral, 2 times daily  . omeprazole (PRILOSEC) 40 mg, Oral, Daily  . rosuvastatin (CRESTOR) 20 mg, Oral, Daily at bedtime  . Tetrahydrozoline HCl (VISINE OP) 1 drop, Ophthalmic, Daily PRN  . valsartan-hydrochlorothiazide (DIOVAN-HCT) 160-12.5 MG tablet TAKE 1 TABLET BY MOUTH EVERY DAY IN THE MORNING  . vitamin B-12 (CYANOCOBALAMIN) 50 mcg, Oral, Daily  . Vitamin D, Cholecalciferol, 10 MCG (400 UNIT) CAPS Oral, Daily  . Vitamin E 100 units TABS Oral, Daily   Radiology:  No results found.  Cardiac Studies:   Lower extremity arterial duplex 07/06/2017 No hemodynamically significant stenoses are identified in the right lower extremity arterial system. Significant velocity increase at the left distal superficial femoral artery suggestive of >50% stenosis. This exam reveals moderately decreased perfusion of the lower extremity, RABI 0.76 and LABI 0.76. 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 Compared to the study done on 03/06/2014, bilateral ABI has decreased from normal. No change in the left mid SFA stenosis.  Right and left heart catheterization 07/12/2017:  Normal to mild Mild elevation in pulmonary artery pressure, normal LVEDP, PA pressure 35/12, mean 23 mmHg. Mild noncritical coronary artery disease, ostial ramus  30% calcific stenosis, otherwise 10-20% luminal irregularity in all 3 vessels. Mild disease in the RCA. Aortic valve could not be crossed. Heavily calcified, mild aortic root dilatation and AV appears to be bicuspid.  Carotid artery duplex 06/02/2020:  No hemodynamically significant arterial disease in the internal carotid  artery bilaterally.  Antegrade right vertebral artery flow. Antegrade left vertebral artery  flow.  Echocardiogram 06/02/2020:  Left ventricle cavity is normal in size. Moderate  concentric hypertrophy of the left ventricle. Abnormal septal wall motion due to post-operative valve. Normal LV systolic function with visual EF 50-55%. Doppler evidence of grade I (impaired) diastolic dysfunction, normal LAP.  Left atrial cavity is moderately dilated.  S/p 21-mm pericardial Magna Ease valve placement (2018). Normal functioning valve without any paravalular leak, dehiscence. Mean PG 15 mmHg is acceptable. likely normal for post-op status.  Mild tricuspid regurgitation. No evidence of pulmonary hypertension.  Compared to previous study on 07/06/2017, aortic valve replacement is new.  Remote pacemaker check  10/17/2019: There was a 0 % cumulative atrial arrhythmia burden. There was 1 high ventricular rate episode detected. EGM shows 6 bts of NSVT. Health trends do not demonstrate significant abnormality.  Battery longevity is 8.1-8.9 years. RA pacing is 10.1 %, RV pacing is 99.9 %.  Scheduled  In office pacemaker check 10/24/19  Single (S)/Dual (D)/BV: D Presenting ASVP Pacemaker dependant:  Yes. Underlying No escape at 35/min. Longevity 8 Years. Magnet rate: >85%. AP 11%, VP 100%. BP NA% AMS Episodes 0  Since 10/17/19. AT/AF burden <1% Longest 9 min on 12/13/19. Latest 05/04/19 for 3:30 min, EGM suggest AT. HVR 1. Longest 1 sec NSVT. Lead measurements: Stable. Thoracic impedance: NA Histogram: Low (L)/normal (N)/high (H) Normal. Patient activity Good.   Observations: Normal device function. Changes: None.  EKG:  EKG 06/16/2020: Sinus rhythm with borderline first-degree AV block at rate of 67 bpm, ventricularly paced rhythm. No further analysis.  No significant change from 12/17/2019.   Assessment     ICD-10-CM   1. Coronary artery disease involving native coronary artery of native heart without angina pectoris  I25.10   2. Atherosclerosis of native artery of both lower extremities with intermittent claudication (HCC)  I70.213 EKG 12-Lead  3. S/P AVR with 21 mm Magna  Ease Pericardial Tissue Valve,  09/18/2017: Jeralene Huff, MD  Z95.2   4. Pacemaker Medtronic Azure XT DR MRI [id number] Dual chamber pacemaker in situ  Z95.0     Recommendations:   No orders of the defined types were placed in this encounter.   Jonathan Brown  is a 69 y.o. male  status post aortic valve replacement using 21 mm pericardial Magna Ease valve for severe aortic stenosis 09/29/2017, Medtronic dual-chamber pacemaker for complete heart block status post aortic valve replacement 10/03/2017. He has hypertension, Mixed hyperlipidemia, PAD.   With regard to peripheral arterial disease, Symptoms are remained stable, there is no critical limb ischemia.  Capillary refill is normal bilaterally.  Blood pressure is controlled. Lipids are well controlled, weight loss was discussed with the patient.  He is due for complete physical examination. He will set this up with his PCP.  Echocardiogram reveals stable aortic valve prosthesis.  LV function is normal.  Carotid duplex does not reveal any significant disease.  Pacemaker is functioning normally.  He has not been doing remote monitoring, but will set up his remote device and will transfer to [place] on a 13-month basis.    Adrian Prows, MD, [facility] 06/16/2020, 9:30 AM [facility]  Pager: [phone] Office: [phone]

## 2020-07-09 ENCOUNTER — Other Ambulatory Visit: Payer: Self-pay | Admitting: Cardiology

## 2020-07-09 DIAGNOSIS — I10 Essential (primary) hypertension: Secondary | ICD-10-CM

## 2020-07-16 ENCOUNTER — Other Ambulatory Visit: Payer: Self-pay | Admitting: Medical

## 2020-07-31 ENCOUNTER — Encounter: Payer: Self-pay | Admitting: Medical

## 2020-08-01 ENCOUNTER — Other Ambulatory Visit: Payer: Self-pay | Admitting: Medical

## 2020-08-01 NOTE — Telephone Encounter (Signed)
Has upcoming appt in September for annual

## 2020-08-02 ENCOUNTER — Telehealth: Payer: Self-pay | Admitting: Cardiology

## 2020-08-04 NOTE — Telephone Encounter (Signed)
Called patient, call could not be completed as dialed.

## 2020-08-25 ENCOUNTER — Other Ambulatory Visit: Payer: Self-pay | Admitting: Medical

## 2020-09-09 ENCOUNTER — Encounter: Payer: Self-pay | Admitting: Medical

## 2020-09-09 ENCOUNTER — Ambulatory Visit (INDEPENDENT_AMBULATORY_CARE_PROVIDER_SITE_OTHER): Payer: Medicare HMO | Admitting: Medical

## 2020-09-09 ENCOUNTER — Other Ambulatory Visit: Payer: Self-pay

## 2020-09-09 VITALS — BP 134/86 | HR 71 | Ht 69.0 in | Wt 195.8 lb

## 2020-09-09 DIAGNOSIS — Z0000 Encounter for general adult medical examination without abnormal findings: Secondary | ICD-10-CM | POA: Diagnosis not present

## 2020-09-09 DIAGNOSIS — Z87891 Personal history of nicotine dependence: Secondary | ICD-10-CM

## 2020-09-09 DIAGNOSIS — E1141 Type 2 diabetes mellitus with diabetic mononeuropathy: Secondary | ICD-10-CM | POA: Diagnosis not present

## 2020-09-09 DIAGNOSIS — E785 Hyperlipidemia, unspecified: Secondary | ICD-10-CM

## 2020-09-09 DIAGNOSIS — J929 Pleural plaque without asbestos: Secondary | ICD-10-CM | POA: Diagnosis not present

## 2020-09-09 DIAGNOSIS — I442 Atrioventricular block, complete: Secondary | ICD-10-CM | POA: Diagnosis not present

## 2020-09-09 DIAGNOSIS — E118 Type 2 diabetes mellitus with unspecified complications: Secondary | ICD-10-CM | POA: Diagnosis not present

## 2020-09-09 DIAGNOSIS — E639 Nutritional deficiency, unspecified: Secondary | ICD-10-CM

## 2020-09-09 DIAGNOSIS — I70213 Atherosclerosis of native arteries of extremities with intermittent claudication, bilateral legs: Secondary | ICD-10-CM | POA: Diagnosis not present

## 2020-09-09 DIAGNOSIS — Z23 Encounter for immunization: Secondary | ICD-10-CM

## 2020-09-09 DIAGNOSIS — I739 Peripheral vascular disease, unspecified: Secondary | ICD-10-CM

## 2020-09-09 DIAGNOSIS — Z125 Encounter for screening for malignant neoplasm of prostate: Secondary | ICD-10-CM | POA: Diagnosis not present

## 2020-09-09 DIAGNOSIS — Z950 Presence of cardiac pacemaker: Secondary | ICD-10-CM

## 2020-09-09 DIAGNOSIS — Z7185 Encounter for immunization safety counseling: Secondary | ICD-10-CM

## 2020-09-09 DIAGNOSIS — Z550 Illiteracy and low-level literacy: Secondary | ICD-10-CM

## 2020-09-09 DIAGNOSIS — K219 Gastro-esophageal reflux disease without esophagitis: Secondary | ICD-10-CM

## 2020-09-09 DIAGNOSIS — I10 Essential (primary) hypertension: Secondary | ICD-10-CM

## 2020-09-09 DIAGNOSIS — E1169 Type 2 diabetes mellitus with other specified complication: Secondary | ICD-10-CM

## 2020-09-09 DIAGNOSIS — Z45018 Encounter for adjustment and management of other part of cardiac pacemaker: Secondary | ICD-10-CM

## 2020-09-09 DIAGNOSIS — Z7189 Other specified counseling: Secondary | ICD-10-CM

## 2020-09-09 DIAGNOSIS — Z952 Presence of prosthetic heart valve: Secondary | ICD-10-CM

## 2020-09-09 NOTE — Progress Notes (Signed)
Subjective:    Jonathan Brown is a 69 y.o. male who presents for Preventative Services visit and chronic medical problems/med check visit.    Primary Care Provider Tysinger, Kermit Balo, PA-C here for primary care  Current Health Care Team:  Eye doctor  Dentist  Dr. Yates Decamp, cardiology   Medical Services you may have received from other than [facility] providers in the past year (date may be approximate) cardiology  Exercise Current exercise habits: walk 1 hour, 3 days a week.   Nutrition/Diet Current diet: well balanced  Depression Screen Depression screen [facility] 2/9 09/09/2020  Decreased Interest 0  Down, Depressed, Hopeless 0  PHQ - 2 Score 0  Altered sleeping -  Tired, decreased energy -  Change in appetite -  Feeling bad or failure about yourself  -  Trouble concentrating -  Moving slowly or fidgety/restless -  Suicidal thoughts -  PHQ-9 Score -    Activities of Daily Living Screen/Functional Status Survey Is the patient deaf or have difficulty hearing?: No Does the patient have difficulty seeing, even when wearing glasses/contacts?: Yes (due to diabetes) Does the patient have difficulty concentrating, remembering, or making decisions?: No Does the patient have difficulty walking or climbing stairs?: No Does the patient have difficulty dressing or bathing?: No Does the patient have difficulty doing errands alone such as visiting a doctor's office or shopping?: No  Can patient draw a clock face showing 3:15 oclock, no  Fall Risk Screen Fall Risk  09/09/2020 09/09/2020 06/15/2018 11/15/2017 06/14/2017  Falls in the past year? 0 0 No No Yes  Risk for fall due to : - - - - Other (Comment)    Gait Assessment: Normal gait observed - yes  Advanced directives Does patient have a Health Care Power of Attorney? No Does patient have a Living Will? No  Past Medical History:  Diagnosis Date  . Arthritis   . CHB (complete heart block) (HCC) 10/05/2017  . Diabetes mellitus  without complication (HCC) 2014   type 2   . Encounter for care of pacemaker 07/20/2019  . Former smoker   . GERD (gastroesophageal reflux disease)   . Hyperlipidemia   . Hypertension 2012  . Pacemaker Medtronic Azure XT DR MRI [id number] Dual chamber pacemaker in situ 10/03/2017  . PVD (peripheral vascular disease) (HCC)   . S/P AVR    Aortic valve replacement using a 21-mm pericardial Magna Ease (09/29/2017 by Dr. Maren Beach)  . 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 AORTIC VALVE REPLACEMENT N/A 09/29/2017   Procedure: AORTIC VALVE REPLACEMENT (AVR) using 90mm Magna Ease valve;  Surgeon: Kerin Perna, MD;  Location: [facility] OR;  Service: Open Heart Surgery;  Laterality: N/A;  . CARDIAC CATHETERIZATION  07/12/2017  . COLONOSCOPY  2008  . LEFT AND RIGHT HEART CATHETERIZATION WITH CORONARY ANGIOGRAM N/A 04/02/2014   Procedure: LEFT AND RIGHT HEART CATHETERIZATION WITH CORONARY ANGIOGRAM;  Surgeon: Pamella Pert, MD;  Location: [facility] CATH LAB;  Service: Cardiovascular;  Laterality: N/A;  . PACEMAKER IMPLANT N/A 10/03/2017   Procedure: PACEMAKER IMPLANT;  Surgeon: Marinus Maw, MD;  Location: [facility] INVASIVE CV LAB;  Service: Cardiovascular;  Laterality: N/A;  . TEE WITHOUT CARDIOVERSION N/A 04/30/2014  . TEE WITHOUT CARDIOVERSION N/A 09/29/2017   Procedure:  TRANSESOPHAGEAL ECHOCARDIOGRAM (TEE);  Surgeon: Donata Clay, Theron Arista, MD;  Location: [facility] OR;  Service: Open Heart Surgery;  Laterality: N/A;    Social History   Socioeconomic History  . Marital status: Married    Spouse name: Not on file  . Number of children: 0  . Years of education: Not on file  . Highest education level: Not on file  Occupational History  . Not on file  Tobacco Use  . Smoking status: Former Smoker    Packs/day: 0.25    Years: 30.00    Pack  years: 7.50    Quit date: 09/28/2007    Years since quitting: 12.9  . Smokeless tobacco: Never Used  Vaping Use  . Vaping Use: Never used  Substance and Sexual Activity  . Alcohol use: No  . Drug use: Yes    Frequency: 7.0 times per week    Types: Marijuana    Comment: patient says he smokes marajuana once a day  . Sexual activity: Not on file  Other Topics Concern  . Not on file  Social History Narrative   Married, walks for exercise, step- son lives in [place], [place].  05/2019   Social Determinants of Health   Financial Resource Strain:   . Difficulty of Paying Living Expenses: Not on file  Food Insecurity:   . Worried About Programme researcher, broadcasting/film/video in the Last Year: Not on file  . Ran Out of Food in the Last Year: Not on file  Transportation Needs:   . Lack of Transportation (Medical): Not on file  . Lack of Transportation (Non-Medical): Not on file  Physical Activity:   . Days of Exercise per Week: Not on file  . Minutes of Exercise per Session: Not on file  Stress:   . Feeling of Stress : Not on file  Social Connections:   . Frequency of Communication with Friends and Family: Not on file  . Frequency of Social Gatherings with Friends and Family: Not on file  . Attends Religious Services: Not on file  . Active Member of Clubs or Organizations: Not on file  . Attends Banker Meetings: Not on file  . Marital Status: Not on file  Intimate Partner Violence:   . Fear of Current or Ex-Partner: Not on file  . Emotionally Abused: Not on file  . Physically Abused: Not on file  . Sexually Abused: Not on file    Family History  Problem Relation Age of Onset  . Other Mother        died of old age  . Heart disease Father   . Sleep apnea Sister   . Diabetes Brother        diabetic coma x 3 years  . Alcohol abuse Brother   . Heart disease Brother 48       MI  . Cancer Neg Hx   . Stroke Neg Hx      Current Outpatient Medications:  .  acetaminophen (TYLENOL) 500  MG tablet, Take 1,000 mg by mouth daily as needed for moderate pain., Disp: , Rfl:  .  amLODipine (NORVASC) 10 MG tablet, TAKE 1 TABLET BY MOUTH EVERY DAY, Disp: 90 tablet, Rfl: 3 .  aspirin EC 81 MG tablet, Take 1 tablet (81 mg total) by mouth daily., Disp: 90 tablet, Rfl: 3 .  glucose blood test strip, 100 each by Other route daily. Use as instructed   Dispense 100 box   12 refills, Disp: 100 each, Rfl: 5 .  Lancets (ONETOUCH ULTRASOFT) lancets, Use as instructed, Disp: 100 each, Rfl: 12 .  Lancets MISC, Use as instructed. Needs lancets based on pt's meter. Needs one touch verio lancets, Disp: 100 each, Rfl: 12 .  latanoprost (XALATAN) 0.005 % ophthalmic solution, INSTILL 1 DROP INTO BOTH EYES EVERY DAY AT NIGHT, Disp: , Rfl:  .  metFORMIN (GLUCOPHAGE) 500 MG tablet, TAKE ONE TABLET BY MOUTH TWICE DAILY WITH  MEAL, Disp: 180 tablet, Rfl: 3 .  metoprolol tartrate (LOPRESSOR) 50 MG tablet, TAKE 1 TABLET BY MOUTH TWICE A DAY, Disp: 90 tablet, Rfl: 1 .  omeprazole (PRILOSEC) 40 MG capsule, TAKE 1 CAPSULE BY MOUTH EVERY DAY, Disp: 90 capsule, Rfl: 0 .  rosuvastatin (CRESTOR) 20 MG tablet, TAKE 1 TABLET BY MOUTH EVERYDAY AT BEDTIME, Disp: 90 tablet, Rfl: 0 .  Tetrahydrozoline HCl (VISINE OP), Apply 1 drop to eye daily as needed (dry eyes)., Disp: , Rfl:  .  valsartan-hydrochlorothiazide (DIOVAN-HCT) 160-12.5 MG tablet, TAKE 1 TABLET BY MOUTH EVERY DAY IN THE MORNING, Disp: 90 tablet, Rfl: 1 .  vitamin B-12 (CYANOCOBALAMIN) 50 MCG tablet, Take 50 mcg by mouth daily., Disp: , Rfl:  .  Vitamin D, Cholecalciferol, 10 MCG (400 UNIT) CAPS, Take by mouth daily., Disp: , Rfl:  .  Vitamin E 100 units TABS, Take by mouth daily., Disp: , Rfl:   No Known Allergies  History reviewed: allergies, current medications, past family history, past medical history, past social history, past surgical history and problem list    Chronic issues discussed:  Compliant with medication  Having some urinary frequency,  some incontinence.  Mild to moderate at times.  His wife helps him check his blood sugars.  He notes most the time they are in the 130s fasting.  His wife just recently had bypass surgery in June  His eye doctor told him his diabetes is affecting his eyes.  Getting some exercise with walking and yard work   Acute issues discussed: Urinary frequency and some incontinence     Objective:      Biometrics BP 134/86   Pulse 71   Ht 5\' 9"  (1.753 m)   Wt 195 lb 12.8 oz (88.8 kg)   SpO2 96%   BMI 28.91 kg/m   Wt Readings from Last 3 Encounters:  09/09/20 195 lb 12.8 oz (88.8 kg)  06/16/20 210 lb 9.6 oz (95.5 kg)  12/17/19 204 lb (92.5 kg)    Cognitive Testing  Alert? Yes  Normal Appearance?Yes  Oriented to person? Yes  Place? Yes   Time? Yes  Recall of three objects?  Yes  Can perform simple calculations? Yes  Displays appropriate judgment?Yes  Can read the correct time from a watch face?No  General appearance: alert, no distress, WD/WN, African American male  Nutritional Status: Inadequate calore intake? no Loss of muscle mass? no Loss of fat beneath skin? no Localized or general edema? no Diminished functional status? no  Other pertinent exam: HEENT: normocephalic, sclerae anicteric, TMs pearly, nares patent, no discharge or erythema, pharynx normal Oral cavity: MMM, no lesions Neck: supple, no lymphadenopathy, no thyromegaly, no masses, no bruits Heart: 3/6 murmur mid systolic, otheries  RRR, normal S1, S2 Lungs: CTA bilaterally, no wheezes, rhonchi, or rales Abdomen: +bs, soft, non tender, non distended, no masses, no hepatomegaly, no splenomegaly Musculoskeletal: nontender, no swelling, no obvious deformity Extremities: no edema, no cyanosis, no clubbing Pulses: 2+ symmetric, upper and lower extremities, normal cap refill Neurological: alert, oriented x 3, CN2-12 intact, strength normal  upper extremities and lower extremities, sensation normal throughout,  DTRs 2+ throughout, no cerebellar signs, gait normal Psychiatric: normal affect, behavior normal, pleasant  GU/rectal - declined   Diabetic Foot Exam - Simple   Simple Foot Form Diabetic Foot exam was performed with the following findings: Yes 09/09/2020 12:36 PM  Visual Inspection See comments: Yes Sensation Testing Intact to touch and monofilament testing bilaterally: Yes Pulse Check Posterior Tibialis and Dorsalis pulse intact bilaterally: Yes Comments Thickened toenails bilat, otherwise no worrisome findings        Assessment:   Encounter Diagnoses  Name Primary?  . Encounter for health maintenance examination in adult Yes  . 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 Atherosclerosis of native artery of both lower extremities with intermittent claudication (HCC)   . Essential hypertension   . CHB (complete heart block) (HCC)   . PVD (peripheral vascular disease) (HCC)   . Gastroesophageal reflux disease without esophagitis   . Pleural plaque   . 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 Type 2 diabetes mellitus with complication, without long-term current use of insulin (HCC)   . Hyperlipidemia, unspecified hyperlipidemia type   . Vaccine counseling   . Screening for prostate cancer   . Former smoker   . S/P AVR   . Pacemaker Medtronic Azure XT DR MRI [id number] Dual chamber pacemaker in situ   . Medicare annual wellness visit, subsequent   . Advanced directives, counseling/discussion   . Encounter for care of pacemaker   . Poor diet   . Need for prophylactic vaccination and inoculation against influenza   . Hyperlipidemia associated with type 2 diabetes mellitus (HCC)      Plan:   A preventative services visit was completed today.  During the course of the visit today, we discussed and counseled about appropriate screening and preventive services.  A health risk assessment was established today that included a review of current medications,  allergies, social history, family history, medical and preventative health history, biometrics, and preventative screenings to identify potential safety concerns or impairments.  A personalized plan was printed today for your records and use.   Personalized health advice and education was given today to reduce health risks and promote self management and wellness.  Information regarding end of life planning was discussed today.  See eye doctor and dentist yearly  Discussed advanced directives.   He will work on this.   Cancer screening: You are up-to-date on colon cancer screening from [facility] last year that was normal  We are checking a prostate PSA blood test today   Vaccines: We updated your flu shot today  Shingles vaccine:  I recommend you have a shingles vaccine to help prevent shingles or herpes zoster outbreak.   Please call your insurer to inquire about coverage for the Shingrix vaccine given in 2 doses.   Some insurers cover this vaccine after age 69, some cover this after age 69.  If your insurer covers this, then call to schedule appointment to have this vaccine here.  You are up-to-date on Covid vaccine  I recommend a pneumococcal 23 vaccine   Problem list issues: Diabetes-monitor your blood sugars fasting in the morning, you should be coming in every 4 months - 6 months for diabetes follow-up.  We will call with lab results and recommendations  Hyperlipidemia-continue cholesterol medicine Crestor daily  High blood pressure-continue your current medications, limit salt intake  Pacemaker-we will look at getting you into cardiac office  for monitoring if your cardiac office that you were seeing can't set this up for you   Lu[place]uel was seen today for medicare wellness.  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  Atherosclerosis of native artery of both lower extremities with intermittent claudication (HCC)  Essential hypertension  CHB (complete heart block) (HCC)  PVD (peripheral vascular disease) (HCC)  Gastroesophageal reflux disease without esophagitis  Pleural plaque  Diabetic mononeuropathy associated with type 2 diabetes mellitus (HCC)  Type 2 diabetes mellitus with complication, without long-term current use of insulin (HCC) -     Hemoglobin A1c -     Microalbumin/Creatinine Ratio, Urine  Hyperlipidemia, unspecified hyperlipidemia type -     Lipid panel  Vaccine counseling  Screening for prostate cancer -     PSA  Former smoker  S/P AVR  Pacemaker Medtronic Azure XT DR MRI [id number] Dual chamber pacemaker in situ  Medicare annual wellness visit, subsequent  Advanced directives, counseling/discussion  Encounter for care of pacemaker  Poor diet  Need for prophylactic vaccination and inoculation against influenza  Hyperlipidemia associated with type 2 diabetes mellitus (HCC)     Medicare Attestation A preventative services visit was completed today.  During the course of the visit the patient was educated and counseled about appropriate screening and preventive services.  A health risk assessment was established with the patient that included a review of current medications, allergies, social history, family history, medical and preventative health history, biometrics, and preventative screenings to identify potential safety concerns or impairments.  A personalized plan was printed today for the patient's records and use.   Personalized health advice and education was given today to reduce health risks and promote self management and wellness.  Information regarding end of life planning was discussed today.  Kristian Covey, PA-C   09/09/2020

## 2020-09-09 NOTE — Patient Instructions (Signed)
See your eye doctor and dentist every year   Cancer screening: You are up-to-date on colon cancer screening from [facility] last year that was normal  We are checking a prostate PSA blood test today   Vaccines: We updated your flu shot today  Shingles vaccine:  I recommend you have a shingles vaccine to help prevent shingles or herpes zoster outbreak.   Please call your insurer to inquire about coverage for the Shingrix vaccine given in 2 doses.   Some insurers cover this vaccine after age 56, some cover this after age 54.  If your insurer covers this, then call to schedule appointment to have this vaccine here.  You are up-to-date on Covid vaccine  I recommend a pneumococcal 23 vaccine   Problem list issues: Diabetes-monitor your blood sugars fasting in the morning, you should be coming in every 4 months - 6 months for diabetes follow-up.  We will call with lab results and recommendations  Hyperlipidemia-continue cholesterol medicine Crestor daily  High blood pressure-continue your current medications, limit salt intake  Pacemaker-we will look at getting you into cardiac office for monitoring if your cardiac office that you were seeing can't set this up for you    Advance Directive  Advance directives are legal documents that let you make choices ahead of time about your health care and medical treatment in case you become unable to communicate for yourself. Advance directives are a way for you to make known your wishes to family, friends, and health care providers. This can let others know about your end-of-life care if you become unable to communicate. Discussing and writing advance directives should happen over time rather than all at once. Advance directives can be changed depending on your situation and what you want, even after you have signed the advance directives. There are different types of advance directives, such as:  Medical power of attorney.  Living will.  Do  not resuscitate (DNR) or do not attempt resuscitation (DNAR) order. Health care proxy and medical power of attorney A health care proxy is also called a health care agent. This is a person who is appointed to make medical decisions for you in cases where you are unable to make the decisions yourself. Generally, people choose someone they know well and trust to represent their preferences. Make sure to ask this person for an agreement to act as your proxy. A proxy may have to exercise judgment in the event of a medical decision for which your wishes are not known. A medical power of attorney is a legal document that names your health care proxy. Depending on the laws in your state, after the document is written, it may also need to be:  Signed.  Notarized.  Dated.  Copied.  Witnessed.  Incorporated into your medical record. You may also want to appoint someone to manage your money in a situation in which you are unable to do so. This is called a durable power of attorney for finances. It is a separate legal document from the durable power of attorney for health care. You may choose the same person or someone different from your health care proxy to act as your agent in money matters. If you do not appoint a proxy, or if there is a concern that the proxy is not acting in your best interests, a court may appoint a guardian to act on your behalf. Living will A living will is a set of instructions that state your wishes about medical care  when you cannot express them yourself. Health care providers should keep a copy of your living will in your medical record. You may want to give a copy to family members or friends. To alert caregivers in case of an emergency, you can place a card in your wallet to let them know that you have a living will and where they can find it. A living will is used if you become:  Terminally ill.  Disabled.  Unable to communicate or make decisions. Items to consider in  your living will include:  To use or not to use life-support equipment, such as dialysis machines and breathing machines (ventilators).  A DNR or DNAR order. This tells health care providers not to use cardiopulmonary resuscitation (CPR) if breathing or heartbeat stops.  To use or not to use tube feeding.  To be given or not to be given food and fluids.  Comfort (palliative) care when the goal becomes comfort rather than a cure.  Donation of organs and tissues. A living will does not give instructions for distributing your money and property if you should pass away. DNR or DNAR A DNR or DNAR order is a request not to have CPR in the event that your heart stops beating or you stop breathing. If a DNR or DNAR order has not been made and shared, a health care provider will try to help any patient whose heart has stopped or who has stopped breathing. If you plan to have surgery, talk with your health care provider about how your DNR or DNAR order will be followed if problems occur. What if I do not have an advance directive? If you do not have an advance directive, some states assign family decision makers to act on your behalf based on how closely you are related to them. Each state has its own laws about advance directives. You may want to check with your health care provider, attorney, or state representative about the laws in your state. Summary  Advance directives are the legal documents that allow you to make choices ahead of time about your health care and medical treatment in case you become unable to tell others about your care.  The process of discussing and writing advance directives should happen over time. You can change the advance directives, even after you have signed them.  Advance directives include DNR or DNAR orders, living wills, and designating an agent as your medical power of attorney. This information is not intended to replace advice given to you by your health care  provider. Make sure you discuss any questions you have with your health care provider. Document Revised: 07/12/2019 Document Reviewed: 07/12/2019 Elsevier Patient Education  2020 [employer].

## 2020-09-10 ENCOUNTER — Other Ambulatory Visit: Payer: Self-pay | Admitting: Medical

## 2020-09-10 DIAGNOSIS — I10 Essential (primary) hypertension: Secondary | ICD-10-CM

## 2020-09-10 DIAGNOSIS — R69 Illness, unspecified: Secondary | ICD-10-CM | POA: Diagnosis not present

## 2020-09-10 DIAGNOSIS — E118 Type 2 diabetes mellitus with unspecified complications: Secondary | ICD-10-CM

## 2020-09-10 LAB — COMPREHENSIVE METABOLIC PANEL
ALT: 22 IU/L (ref 0–44)
AST: 16 IU/L (ref 0–40)
Albumin/Globulin Ratio: 1.5 (ref 1.2–2.2)
Albumin: 4.3 g/dL (ref 3.8–4.8)
Alkaline Phosphatase: 95 IU/L (ref 44–121)
BUN/Creatinine Ratio: 12 (ref 10–24)
BUN: 13 mg/dL (ref 8–27)
Bilirubin Total: 0.5 mg/dL (ref 0.0–1.2)
CO2: 20 mmol/L (ref 20–29)
Calcium: 10.1 mg/dL (ref 8.6–10.2)
Chloride: 110 mmol/L — ABNORMAL HIGH (ref 96–106)
Creatinine, Ser: 1.08 mg/dL (ref 0.76–1.27)
GFR calc Af Amer: 81 mL/min/{1.73_m2} (ref >59)
GFR calc non Af Amer: 70 mL/min/{1.73_m2} (ref >59)
Globulin, Total: 2.9 g/dL (ref 1.5–4.5)
Glucose: 96 mg/dL (ref 65–99)
Potassium: 4.6 mmol/L (ref 3.5–5.2)
Sodium: 145 mmol/L — ABNORMAL HIGH (ref 134–144)
Total Protein: 7.2 g/dL (ref 6.0–8.5)

## 2020-09-10 LAB — CBC WITH DIFFERENTIAL/PLATELET
Basophils Absolute: 0.1 10*3/uL (ref 0.0–0.2)
Basos: 1 %
EOS (ABSOLUTE): 0.2 10*3/uL (ref 0.0–0.4)
Eos: 3 %
Hematocrit: 39 % (ref 37.5–51.0)
Hemoglobin: 13.3 g/dL (ref 13.0–17.7)
Immature Grans (Abs): 0 10*3/uL (ref 0.0–0.1)
Immature Granulocytes: 0 %
Lymphocytes Absolute: 2 10*3/uL (ref 0.7–3.1)
Lymphs: 31 %
MCH: 30.8 pg (ref 26.6–33.0)
MCHC: 34.1 g/dL (ref 31.5–35.7)
MCV: 90 fL (ref 79–97)
Monocytes Absolute: 0.8 10*3/uL (ref 0.1–0.9)
Monocytes: 12 %
Neutrophils Absolute: 3.5 10*3/uL (ref 1.4–7.0)
Neutrophils: 53 %
Platelets: 179 10*3/uL (ref 150–450)
RBC: 4.32 x10E6/uL (ref 4.14–5.80)
RDW: 12.6 % (ref 11.6–15.4)
WBC: 6.6 10*3/uL (ref 3.4–10.8)

## 2020-09-10 LAB — MICROALBUMIN / CREATININE URINE RATIO
Creatinine, Urine: 212.2 mg/dL
Microalb/Creat Ratio: 7 mg/g creat (ref 0–29)
Microalbumin, Urine: 14 ug/mL

## 2020-09-10 LAB — LIPID PANEL
Chol/HDL Ratio: 4 ratio (ref 0.0–5.0)
Cholesterol, Total: 125 mg/dL (ref 100–199)
HDL: 31 mg/dL — ABNORMAL LOW (ref >39)
LDL Chol Calc (NIH): 55 mg/dL (ref 0–99)
Triglycerides: 244 mg/dL — ABNORMAL HIGH (ref 0–149)
VLDL Cholesterol Cal: 39 mg/dL (ref 5–40)

## 2020-09-10 LAB — PSA: Prostate Specific Ag, Serum: 1.6 ng/mL (ref 0.0–4.0)

## 2020-09-10 LAB — HEMOGLOBIN A1C
Est. average glucose Bld gHb Est-mCnc: 120 mg/dL
Hgb A1c MFr Bld: 5.8 % — ABNORMAL HIGH (ref 4.8–5.6)

## 2020-09-10 MED ORDER — METFORMIN HCL 500 MG PO TABS: ORAL_TABLET | ORAL | 3 refills | Status: DC

## 2020-09-10 MED ORDER — GLUCOSE BLOOD VI STRP: 100.0000 | ORAL_STRIP | Freq: Every day | 12 refills | Status: DC

## 2020-09-10 MED ORDER — METOPROLOL TARTRATE 50 MG PO TABS: 50.0000 mg | ORAL_TABLET | Freq: Two times a day (BID) | ORAL | 3 refills | Status: DC

## 2020-09-10 MED ORDER — ROSUVASTATIN CALCIUM 20 MG PO TABS: ORAL_TABLET | ORAL | 3 refills | Status: DC

## 2020-09-10 MED ORDER — VALSARTAN-HYDROCHLOROTHIAZIDE 160-12.5 MG PO TABS: ORAL_TABLET | ORAL | 3 refills | Status: DC

## 2020-09-10 MED ORDER — LANCETS MISC: 12 refills | Status: DC

## 2020-09-10 MED ORDER — ASPIRIN EC 81 MG PO TBEC: 81.0000 mg | DELAYED_RELEASE_TABLET | Freq: Every day | ORAL | 3 refills | Status: DC

## 2020-09-10 MED ORDER — EMPAGLIFLOZIN 10 MG PO TABS: 10.0000 mg | ORAL_TABLET | Freq: Every day | ORAL | 0 refills | Status: DC

## 2020-09-10 MED ORDER — ONETOUCH ULTRASOFT LANCETS MISC: 12 refills | Status: DC

## 2020-09-12 DIAGNOSIS — H401112 Primary open-angle glaucoma, right eye, moderate stage: Secondary | ICD-10-CM | POA: Diagnosis not present

## 2020-10-20 DIAGNOSIS — I442 Atrioventricular block, complete: Secondary | ICD-10-CM | POA: Diagnosis not present

## 2020-10-20 DIAGNOSIS — Z950 Presence of cardiac pacemaker: Secondary | ICD-10-CM | POA: Diagnosis not present

## 2020-10-20 DIAGNOSIS — Z45018 Encounter for adjustment and management of other part of cardiac pacemaker: Secondary | ICD-10-CM | POA: Diagnosis not present

## 2020-10-22 ENCOUNTER — Other Ambulatory Visit: Payer: Self-pay

## 2020-10-22 ENCOUNTER — Ambulatory Visit: Payer: Medicare HMO | Admitting: Cardiology

## 2020-10-22 DIAGNOSIS — I442 Atrioventricular block, complete: Secondary | ICD-10-CM

## 2020-10-22 DIAGNOSIS — Z45018 Encounter for adjustment and management of other part of cardiac pacemaker: Secondary | ICD-10-CM | POA: Diagnosis not present

## 2020-10-22 DIAGNOSIS — Z950 Presence of cardiac pacemaker: Secondary | ICD-10-CM | POA: Diagnosis not present

## 2020-10-22 NOTE — Progress Notes (Signed)
Scheduled  In office pacemaker check 10/22/20  Single (S)/Dual (D)/BV: D. Presenting ASVP. Pacemaker dependant:  Yes. Underlying CHB. AP 12.5%, VP 100%.  AMS Episodes 0.  AT/AF burden <1%%. HVR 0.  Longevity 7.3 Years. Magnet rate: >85%. Lead measurements: Stable. Histogram: Low (L)/normal (N)/high (H)  [place]. Patient activity Good.   Observations: Normal pacemaker function. Changes: None   Jonathan Decamp, MD, [facility] 10/22/2020, 2:18 PM Office: [phone] Pager: [phone] .

## 2020-10-24 ENCOUNTER — Telehealth: Payer: Self-pay | Admitting: Cardiology

## 2020-10-24 NOTE — Telephone Encounter (Signed)
Scheduled Remote pacemaker check  10/14/2020:  4 atrial high rate episodes detected. The longest lasted 00:00:04:06 in duration. Review of EGMs reveals paroxysmal atrial tachycardia. There was a <0.1 % cumulative atrial arrhythmia burden. There was 1 high ventricular rate episode detected. Review of EGM demonstrates a 9 beat episode of NSVT. Health trends do not demonstrate significant abnormality. Battery longevity is 7.3 years. RA pacing is 10.1 %, RV pacing is 100.0 %.

## 2020-11-08 ENCOUNTER — Other Ambulatory Visit: Payer: Self-pay | Admitting: Medical

## 2020-11-28 ENCOUNTER — Ambulatory Visit: Payer: Medicare HMO | Attending: Internal Medicine

## 2020-11-28 DIAGNOSIS — R69 Illness, unspecified: Secondary | ICD-10-CM | POA: Diagnosis not present

## 2020-11-28 DIAGNOSIS — Z23 Encounter for immunization: Secondary | ICD-10-CM

## 2020-11-28 NOTE — Progress Notes (Signed)
   Covid-19 Vaccination Clinic  Name:  YONATAN GUITRON    MRN: [id number] DOB: October 27, 1951  11/28/2020  Mr. Cordial was observed post Covid-19 immunization for 15 minutes without incident. He was provided with Vaccine Information Sheet and instruction to access the V-Safe system.   Mr. Riegler was instructed to call 911 with any severe reactions post vaccine: Marland Kitchen Difficulty breathing  . Swelling of face and throat  . A fast heartbeat  . A bad rash all over body  . Dizziness and weakness   Immunizations Administered    Name Date Dose VIS Date Route   Pfizer COVID-19 Vaccine 11/28/2020  1:33 PM 0.3 mL 10/15/2020 Intramuscular   Manufacturer: [employer], [employer]   Lot: [id number]   NDC: [address]-3

## 2020-12-04 ENCOUNTER — Other Ambulatory Visit: Payer: Self-pay | Admitting: Medical

## 2020-12-08 DIAGNOSIS — R69 Illness, unspecified: Secondary | ICD-10-CM | POA: Diagnosis not present

## 2020-12-11 ENCOUNTER — Encounter: Payer: Self-pay | Admitting: Medical

## 2020-12-11 ENCOUNTER — Other Ambulatory Visit: Payer: Self-pay

## 2020-12-11 ENCOUNTER — Ambulatory Visit (INDEPENDENT_AMBULATORY_CARE_PROVIDER_SITE_OTHER): Payer: Medicare HMO | Admitting: Medical

## 2020-12-11 VITALS — BP 124/80 | HR 68 | Ht 69.0 in | Wt 196.0 lb

## 2020-12-11 DIAGNOSIS — I70213 Atherosclerosis of native arteries of extremities with intermittent claudication, bilateral legs: Secondary | ICD-10-CM | POA: Diagnosis not present

## 2020-12-11 DIAGNOSIS — E1169 Type 2 diabetes mellitus with other specified complication: Secondary | ICD-10-CM | POA: Diagnosis not present

## 2020-12-11 DIAGNOSIS — E118 Type 2 diabetes mellitus with unspecified complications: Secondary | ICD-10-CM

## 2020-12-11 DIAGNOSIS — E785 Hyperlipidemia, unspecified: Secondary | ICD-10-CM

## 2020-12-11 DIAGNOSIS — I10 Essential (primary) hypertension: Secondary | ICD-10-CM

## 2020-12-11 LAB — BASIC METABOLIC PANEL
BUN/Creatinine Ratio: 13 (ref 10–24)
BUN: 17 mg/dL (ref 8–27)
CO2: 19 mmol/L — ABNORMAL LOW (ref 20–29)
Calcium: 10 mg/dL (ref 8.6–10.2)
Chloride: 105 mmol/L (ref 96–106)
Creatinine, Ser: 1.32 mg/dL — ABNORMAL HIGH (ref 0.76–1.27)
GFR calc Af Amer: 63 mL/min/{1.73_m2} (ref >59)
GFR calc non Af Amer: 55 mL/min/{1.73_m2} — ABNORMAL LOW (ref >59)
Glucose: 210 mg/dL — ABNORMAL HIGH (ref 65–99)
Potassium: 4.1 mmol/L (ref 3.5–5.2)
Sodium: 140 mmol/L (ref 134–144)

## 2020-12-11 MED ORDER — OMEPRAZOLE 40 MG PO CPDR: DELAYED_RELEASE_CAPSULE | ORAL | 3 refills | Status: DC

## 2020-12-11 MED ORDER — HYDROCORTISONE 2.5 % EX CREA: TOPICAL_CREAM | Freq: Two times a day (BID) | CUTANEOUS | 1 refills | Status: DC

## 2020-12-11 NOTE — Progress Notes (Signed)
Subjective:   Jonathan Brown is an 69 y.o. male who presents for follow up of Type 2 diabetes mellitus and med check.   Date of diagnosis of diabetes: 2014 Current treatments: Metformin 500 mg twice daily, Jardiance 10 mg daily added last visit in September Medication compliance: excellent  Patient is checking home blood sugars.   Home blood sugar records: Sometimes higher than 130, most the time under 130 Current symptoms include: none. Patient denies foot ulcerations, hyperglycemia, hypoglycemia , nausea, polydipsia, polyuria and visual disturbances.  Patient is checking their feet daily. Foot concerns (callous, ulcer, wound, thickened nails, toenail fungus, skin fungus, hammer toe): none Last dilated eye exam 02/2020 Current diet: healthy Current exercise: walking Known diabetic complications: Hyperlipidemia, heart disease,, PVD  Compliant with blood pressure medications.  He notes home blood pressures are running normal  Hyperlipidemia, PVD, atherosclerosis-compliant with statin  The only concern today is irritation of the skin sometimes at the keloid of his chest from prior CABG   Past Medical History:  Diagnosis Date  . Arthritis   . CHB (complete heart block) (HCC) 10/05/2017  . Diabetes mellitus without complication (HCC) 2014   type 2   . Encounter for care of pacemaker 07/20/2019  . Former smoker   . GERD (gastroesophageal reflux disease)   . Hyperlipidemia   . Hypertension 2012  . Pacemaker Medtronic Azure XT DR MRI [id number] Dual chamber pacemaker in situ 10/03/2017  . PVD (peripheral vascular disease) (HCC)   . S/P AVR    Aortic valve replacement using a 21-mm pericardial Magna Ease (09/29/2017 by Dr. Maren Beach)  . 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    wife is able to read to him    Current Outpatient Medications on File Prior to Visit  Medication Sig Dispense  Refill  . acetaminophen (TYLENOL) 500 MG tablet Take 1,000 mg by mouth daily as needed for moderate pain.    Marland Kitchen amLODipine (NORVASC) 10 MG tablet TAKE 1 TABLET BY MOUTH EVERY DAY 90 tablet 3  . aspirin EC 81 MG tablet Take 1 tablet (81 mg total) by mouth daily. 90 tablet 3  . glucose blood test strip 100 each by Other route daily. Use as instructed   Dispense 100 box   12 refills 100 each 12  . JARDIANCE 10 MG TABS tablet TAKE 1 TABLET (10 MG TOTAL) BY MOUTH DAILY BEFORE BREAKFAST. 90 tablet 0  . Lancets (ONETOUCH ULTRASOFT) lancets Use as instructed 100 each 12  . Lancets MISC Use as instructed. Needs lancets based on pt's meter. Needs one touch verio lancets 100 each 12  . latanoprost (XALATAN) 0.005 % ophthalmic solution INSTILL 1 DROP INTO BOTH EYES EVERY DAY AT NIGHT    . metFORMIN (GLUCOPHAGE) 500 MG tablet TAKE ONE TABLET BY MOUTH TWICE DAILY WITH  MEAL 180 tablet 3  . metoprolol tartrate (LOPRESSOR) 50 MG tablet Take 1 tablet (50 mg total) by mouth 2 (two) times daily. 180 tablet 3  . rosuvastatin (CRESTOR) 20 MG tablet TAKE 1 TABLET BY MOUTH EVERYDAY AT BEDTIME 90 tablet 3  . Tetrahydrozoline HCl (VISINE OP) Apply 1 drop to eye daily as needed (dry eyes).    . valsartan-hydrochlorothiazide (DIOVAN-HCT) 160-12.5 MG tablet TAKE 1 TABLET BY MOUTH EVERY DAY IN THE MORNING 90 tablet 3  . vitamin B-12 (CYANOCOBALAMIN) 50 MCG tablet Take 50 mcg by mouth daily.    Marland Kitchen  Vitamin D, Cholecalciferol, 10 MCG (400 UNIT) CAPS Take by mouth daily.    . Vitamin E 100 units TABS Take by mouth daily.     No current facility-administered medications on file prior to visit.     The following portions of the patient's history were reviewed and updated as appropriate: allergies, current medications, past family history, past medical history, past social history, past surgical history and problem list.  ROS as in subjective above    Objective:   BP 124/80   Pulse 68   Ht 5\' 9"  (1.753 m)   Wt 196 lb  (88.9 kg)   SpO2 98%   BMI 28.94 kg/m   General appearence: alert, no distress, WD/WN,  Neck: supple, no lymphadenopathy, no thyromegaly, no masses Heart: RRR, normal S1, S2, no murmurs Lungs: CTA bilaterally, no wheezes, rhonchi, or rales Abdomen: +bs, soft, non tender, non distended, no masses, no hepatomegaly, no splenomegaly Pulses: 2+ symmetric, upper and lower extremities, normal cap refill Ext: no edema    Assessment:   Encounter Diagnoses  Name Primary?  . Essential hypertension Yes  . Atherosclerosis of native artery of both lower extremities with intermittent claudication (HCC)   . Type 2 diabetes mellitus with complication, without long-term current use of insulin (HCC)   . Hyperlipidemia associated with type 2 diabetes mellitus (HCC)      Plan:   Diabetes Mellitus type 2: Education: Reviewed 'ABCs' of diabetes management (respective goals in parentheses):  A1C (<7), blood pressure (<130/80), and cholesterol (LDL <100)  Diabetes: Medications: Continue current medications Metformin and Jardiance.  Discussed importance of water intake on Jardiance  Education today included:  blood sugar goals, complications of diabetes mellitus, self-monitoring of blood glucose skills and nutrition Advised daily foot checks, yearly diabetic eye exams, dental hygiene Compliance at present is estimated to be excellent.   Hypertension-continue current medication  Hyperlipidemia-continue current medication  Skin irritation at the keloid scar-he declines dermatology referral.  He can use cream below as needed  I reviewed his full lab panel from September 2021.  We discussed the lab findings at that time which were stable.  Fortunately he has even lost weight since June  Jonathan Brown was seen today for medication management.  Diagnoses and all orders for this visit:  Essential hypertension -     Basic metabolic panel  Atherosclerosis of native artery of both lower extremities with  intermittent claudication (HCC)  Type 2 diabetes mellitus with complication, without long-term current use of insulin (HCC) -     HgB A1c -     Basic metabolic panel  Hyperlipidemia associated with type 2 diabetes mellitus (HCC)  Other orders -     hydrocortisone 2.5 % cream; Apply topically 2 (two) times daily. -     omeprazole (PRILOSEC) 40 MG capsule; TAKE 1 CAPSULE BY MOUTH EVERY DAY     Follow up: 6 months

## 2020-12-12 LAB — POCT GLYCOSYLATED HEMOGLOBIN (HGB A1C): Hemoglobin A1C: 6.1 % — AB (ref 4.0–5.6)

## 2021-01-02 ENCOUNTER — Telehealth: Payer: Self-pay | Admitting: Medical

## 2021-01-02 NOTE — Telephone Encounter (Signed)
In that case fax back to form and make a note of that so they can quit sending me the notice

## 2021-01-02 NOTE — Telephone Encounter (Signed)
Please verify with pharmacy that he is actually picking up his Crestor statin.  I keep getting messages from [facility] that he is not using a statin when in fact he is prescribed this

## 2021-01-02 NOTE — Telephone Encounter (Signed)
[facility].

## 2021-01-14 DIAGNOSIS — Z45018 Encounter for adjustment and management of other part of cardiac pacemaker: Secondary | ICD-10-CM | POA: Diagnosis not present

## 2021-01-14 DIAGNOSIS — I442 Atrioventricular block, complete: Secondary | ICD-10-CM | POA: Diagnosis not present

## 2021-01-14 DIAGNOSIS — Z950 Presence of cardiac pacemaker: Secondary | ICD-10-CM | POA: Diagnosis not present

## 2021-01-15 NOTE — Telephone Encounter (Signed)
See prior message and confirm this has been done

## 2021-01-15 NOTE — Telephone Encounter (Signed)
Form was faxed back.

## 2021-01-22 ENCOUNTER — Telehealth: Payer: Self-pay | Admitting: Cardiology

## 2021-01-23 NOTE — Telephone Encounter (Signed)
Attempted to call pt, no answer. Left vm requesting cb.

## 2021-02-26 DIAGNOSIS — H401112 Primary open-angle glaucoma, right eye, moderate stage: Secondary | ICD-10-CM | POA: Diagnosis not present

## 2021-02-26 DIAGNOSIS — H52203 Unspecified astigmatism, bilateral: Secondary | ICD-10-CM | POA: Diagnosis not present

## 2021-02-26 DIAGNOSIS — H524 Presbyopia: Secondary | ICD-10-CM | POA: Diagnosis not present

## 2021-02-26 DIAGNOSIS — H401123 Primary open-angle glaucoma, left eye, severe stage: Secondary | ICD-10-CM | POA: Diagnosis not present

## 2021-02-26 DIAGNOSIS — E119 Type 2 diabetes mellitus without complications: Secondary | ICD-10-CM | POA: Diagnosis not present

## 2021-02-26 DIAGNOSIS — H269 Unspecified cataract: Secondary | ICD-10-CM

## 2021-02-26 DIAGNOSIS — H4010X0 Unspecified open-angle glaucoma, stage unspecified: Secondary | ICD-10-CM

## 2021-02-26 DIAGNOSIS — H25813 Combined forms of age-related cataract, bilateral: Secondary | ICD-10-CM | POA: Diagnosis not present

## 2021-02-26 HISTORY — DX: Unspecified cataract: H26.9

## 2021-02-26 HISTORY — DX: Unspecified open-angle glaucoma, stage unspecified: H40.10X0

## 2021-02-26 LAB — HM DIABETES EYE EXAM

## 2021-03-05 ENCOUNTER — Other Ambulatory Visit: Payer: Self-pay | Admitting: Medical

## 2021-03-05 ENCOUNTER — Encounter: Payer: Self-pay | Admitting: Medical

## 2021-03-18 DIAGNOSIS — H2512 Age-related nuclear cataract, left eye: Secondary | ICD-10-CM | POA: Diagnosis not present

## 2021-03-18 DIAGNOSIS — H25012 Cortical age-related cataract, left eye: Secondary | ICD-10-CM | POA: Diagnosis not present

## 2021-04-15 DIAGNOSIS — I442 Atrioventricular block, complete: Secondary | ICD-10-CM | POA: Diagnosis not present

## 2021-04-15 DIAGNOSIS — Z45010 Encounter for checking and testing of cardiac pacemaker pulse generator [battery]: Secondary | ICD-10-CM | POA: Diagnosis not present

## 2021-04-15 DIAGNOSIS — Z45018 Encounter for adjustment and management of other part of cardiac pacemaker: Secondary | ICD-10-CM | POA: Diagnosis not present

## 2021-04-15 DIAGNOSIS — Z950 Presence of cardiac pacemaker: Secondary | ICD-10-CM | POA: Diagnosis not present

## 2021-04-19 ENCOUNTER — Telehealth: Payer: Self-pay | Admitting: Cardiology

## 2021-04-22 ENCOUNTER — Other Ambulatory Visit: Payer: Self-pay

## 2021-04-22 ENCOUNTER — Ambulatory Visit: Payer: Medicare HMO | Admitting: Cardiology

## 2021-04-22 ENCOUNTER — Encounter: Payer: Self-pay | Admitting: Cardiology

## 2021-04-22 VITALS — BP 112/68 | HR 67 | Temp 97.6°F | Resp 17 | Ht 69.0 in | Wt 199.0 lb

## 2021-04-22 DIAGNOSIS — Z952 Presence of prosthetic heart valve: Secondary | ICD-10-CM

## 2021-04-22 DIAGNOSIS — Z950 Presence of cardiac pacemaker: Secondary | ICD-10-CM

## 2021-04-22 DIAGNOSIS — E118 Type 2 diabetes mellitus with unspecified complications: Secondary | ICD-10-CM

## 2021-04-22 DIAGNOSIS — I10 Essential (primary) hypertension: Secondary | ICD-10-CM | POA: Diagnosis not present

## 2021-04-22 DIAGNOSIS — I70213 Atherosclerosis of native arteries of extremities with intermittent claudication, bilateral legs: Secondary | ICD-10-CM | POA: Diagnosis not present

## 2021-04-22 NOTE — Progress Notes (Signed)
Primary Physician/Referring:  Jac Canavanysinger, David S, PA-C  Patient ID: Jonathan Brown, male    DOB: 09/08/1951, 70 y.o.   MRN: [id number]  Chief Complaint  Patient presents with  . Coronary Artery Disease  . Hypertension  . Follow-up    6 MONTH   HPI:    Jonathan Brown  is a 70 y.o. AAM with aortic valve replacement using 21 mm pericardial Magna Ease valve for severe aortic stenosis 09/29/2017, Medtronic dual-chamber pacemaker for complete heart block status post aortic valve replacement 10/03/2017. He has hypertension, Mixed hyperlipidemia, PAD.   This is a six-month office visit, states that he is doing well and has been exercising regularly, no chest pain or shortness of breath.  States that his diabetes is also controlled.  Has occasional cramping in his calves but not lifestyle limiting.   Past Medical History:  Diagnosis Date  . Arthritis   . Cataract 02/26/2021   Bilateral  . CHB (complete heart block) (HCC) 10/05/2017  . Diabetes mellitus without complication (HCC) 2014   type 2   . Encounter for care of pacemaker 07/20/2019  . Former smoker   . GERD (gastroesophageal reflux disease)   . Hyperlipidemia   . Hypertension 2012  . Open-angle glaucoma 02/26/2021  . Pacemaker Medtronic Azure XT DR MRI [id number] Dual chamber pacemaker in situ 10/03/2017  . PVD (peripheral vascular disease) (HCC)   . S/P AVR    Aortic valve replacement using a 21-mm pericardial Magna Ease (09/29/2017 by Dr. Maren BeachVanTrigt)  . 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 AORTIC VALVE REPLACEMENT N/A 09/29/2017   Procedure: AORTIC VALVE REPLACEMENT (AVR) using 21mm Magna Ease valve;  Surgeon: Kerin PernaVan Trigt, Peter, MD;  [facility];  Service: Open Heart Surgery;  Laterality: N/A;  . CARDIAC CATHETERIZATION  07/12/2017  .  COLONOSCOPY  2008  . LEFT AND RIGHT HEART CATHETERIZATION WITH CORONARY ANGIOGRAM N/A 04/02/2014   Procedure: LEFT AND RIGHT HEART CATHETERIZATION WITH CORONARY ANGIOGRAM;  Surgeon: Pamella PertJagadeesh R Azarius Lambson, MD;  [facility];  Service: Cardiovascular;  Laterality: N/A;  . PACEMAKER IMPLANT N/A 10/03/2017   Procedure: PACEMAKER IMPLANT;  Surgeon: Marinus Mawaylor, Gregg W, MD;  Location: [facility] INVASIVE CV LAB;  Service: Cardiovascular;  Laterality: N/A;  . TEE WITHOUT CARDIOVERSION N/A 04/30/2014  . TEE WITHOUT CARDIOVERSION N/A 09/29/2017   Procedure: TRANSESOPHAGEAL ECHOCARDIOGRAM (TEE);  Surgeon: Donata ClayVan Trigt, Theron AristaPeter, MD;  [facility];  Service: Open Heart Surgery;  Laterality: N/A;   Social History   Socioeconomic History  . Marital status: Married    Spouse name: Not on file  . Number of children: 0  . Years of education: Not on file  . Highest education level: Not on file  Occupational History  . Not on file  Tobacco Use  . Smoking status: Former Smoker    Packs/day: 0.25    Years: 30.00    Pack years: 7.50    Quit date: 09/28/2007    Years since quitting: 13.5  . Smokeless tobacco: Never Used  Vaping Use  . Vaping Use: Never used  Substance and Sexual Activity  . Alcohol use: No  . Drug use: Yes    Frequency: 7.0 times per week    Types: Marijuana    Comment: patient says  he smokes marajuana once a day  . Sexual activity: Not on file  Other Topics Concern  . Not on file  Social History Narrative   Married, walks for exercise, step- son lives in [place], [place].  05/2019   Social Determinants of Health   Financial Resource Strain: Not on file  Food Insecurity: Not on file  Transportation Needs: Not on file  Physical Activity: Not on file  Stress: Not on file  Social Connections: Not on file  Intimate Partner Violence: Not on file   ROS  Review of Systems  Constitutional: Positive for malaise/fatigue (decreased exercise tolerance).  Cardiovascular: Positive for claudication.  Negative for chest pain, dyspnea on exertion and leg swelling.  Gastrointestinal: Negative for melena.   Objective  Blood pressure 112/68, pulse 67, temperature 97.6 F (36.4 C), temperature source Temporal, resp. rate 17, height 5\' 9"  (1.753 m), weight 199 lb (90.3 kg), SpO2 97 %.  Vitals with BMI 04/22/2021 12/11/2020 09/09/2020  Height 5\' 9"  5\' 9"  5\' 9"   Weight 199 lbs 196 lbs 195 lbs 13 oz  BMI 29.37 28.93 28.9  Systolic 112 124 09/11/2020  Diastolic 68 80 86  Pulse 67 68 71     Physical Exam Constitutional:      General: He is not in acute distress.    Appearance: He is well-developed.  Neck:     Thyroid: No thyromegaly.  Cardiovascular:     Rate and Rhythm: Normal rate and regular rhythm.     Pulses:          Carotid pulses are 2+ on the right side with bruit and 2+ on the left side with bruit.      Femoral pulses are 2+ on the right side and 2+ on the left side.      Popliteal pulses are 1+ on the right side and 1+ on the left side.       Dorsalis pedis pulses are 0 on the right side and 0 on the left side.       Posterior tibial pulses are 2+ on the right side and 2+ on the left side.     Heart sounds: Murmur heard.   Harsh midsystolic murmur is present with a grade of 2/6 radiating to the neck. No gallop.      Comments: No leg edema, Skin is shiny and hair loss noted.  No JVD. Pulmonary:     Effort: Pulmonary effort is normal.     Breath sounds: Normal breath sounds.  Abdominal:     General: Bowel sounds are normal.     Palpations: Abdomen is soft.    Laboratory examination:   Recent Labs    09/09/20 1314 12/11/20 1008  NA 145* 140  K 4.6 4.1  CL 110* 105  CO2 20 19*  GLUCOSE 96 210*  BUN 13 17  CREATININE 1.08 1.32*  CALCIUM 10.1 10.0  GFRNONAA 70 55*  GFRAA 81 63   CrCl cannot be calculated (Patient's most recent lab result is older than the maximum 21 days allowed.).  CMP Latest Ref Rng & Units 12/11/2020 09/09/2020 06/18/2019  Glucose 65 - 99 mg/dL 12/13/20)  96 12/13/2020)  BUN 8 - 27 mg/dL 17 13 12   Creatinine 0.76 - 1.27 mg/dL 09/11/2020) 06/20/2019 193(X  Sodium 134 - 144 mmol/L 140 145(H) 142  Potassium 3.5 - 5.2 mmol/L 4.1 4.6 4.0  Chloride 96 - 106 mmol/L 105 110(H) 108(H)  CO2 20 - 29 mmol/L 19(L) 20 20  Calcium 8.6 -  10.2 mg/dL 73.5 32.9 9.9  Total Protein 6.0 - 8.5 g/dL - 7.2 6.8  Total Bilirubin 0.0 - 1.2 mg/dL - 0.5 0.5  Alkaline Phos 44 - 121 IU/L - 95 100  AST 0 - 40 IU/L - 16 26  ALT 0 - 44 IU/L - 22 31   CBC Latest Ref Rng & Units 09/09/2020 06/18/2019 02/14/2018  WBC 3.4 - 10.8 x10E3/uL 6.6 5.7 5.6  Hemoglobin 13.0 - 17.7 g/dL 92.4 26.8 34.1  Hematocrit 37.5 - 51.0 % 39.0 39.3 40.5  Platelets 150 - 450 x10E3/uL 179 166 195   Lipid Panel     Component Value Date/Time   CHOL 125 09/09/2020 1314   TRIG 244 (H) 09/09/2020 1314   HDL 31 (L) 09/09/2020 1314   CHOLHDL 4.0 09/09/2020 1314   CHOLHDL 4.3 06/14/2017 0835   VLDL 16 06/14/2017 0835   LDLCALC 55 09/09/2020 1314   HEMOGLOBIN A1C Lab Results  Component Value Date   HGBA1C 6.1 (A) 12/12/2020   MPG 99.67 09/27/2017   TSH No results for input(s): TSH in the last 8760 hours.  Medications and allergies  No Known Allergies   Current Outpatient Medications on File Prior to Visit  Medication Sig Dispense Refill  . acetaminophen (TYLENOL) 500 MG tablet Take 1,000 mg by mouth daily as needed for moderate pain.    Marland Kitchen amLODipine (NORVASC) 10 MG tablet TAKE 1 TABLET BY MOUTH EVERY DAY 90 tablet 3  . aspirin EC 81 MG tablet Take 1 tablet (81 mg total) by mouth daily. 90 tablet 3  . glucose blood test strip 100 each by Other route daily. Use as instructed   Dispense 100 box   12 refills 100 each 12  . hydrocortisone 2.5 % cream Apply topically 2 (two) times daily. 30 g 1  . JARDIANCE 10 MG TABS tablet TAKE 1 TABLET BY MOUTH DAILY BEFORE BREAKFAST. 90 tablet 0  . Lancets (ONETOUCH ULTRASOFT) lancets Use as instructed 100 each 12  . Lancets MISC Use as instructed. Needs lancets based on  pt's meter. Needs one touch verio lancets 100 each 12  . latanoprost (XALATAN) 0.005 % ophthalmic solution INSTILL 1 DROP INTO BOTH EYES EVERY DAY AT NIGHT    . metFORMIN (GLUCOPHAGE) 500 MG tablet TAKE ONE TABLET BY MOUTH TWICE DAILY WITH  MEAL 180 tablet 3  . metoprolol tartrate (LOPRESSOR) 50 MG tablet Take 1 tablet (50 mg total) by mouth 2 (two) times daily. 180 tablet 3  . omeprazole (PRILOSEC) 40 MG capsule TAKE 1 CAPSULE BY MOUTH EVERY DAY 90 capsule 3  . rosuvastatin (CRESTOR) 20 MG tablet TAKE 1 TABLET BY MOUTH EVERYDAY AT BEDTIME 90 tablet 3  . Tetrahydrozoline HCl (VISINE OP) Apply 1 drop to eye daily as needed (dry eyes).    . valsartan-hydrochlorothiazide (DIOVAN-HCT) 160-12.5 MG tablet TAKE 1 TABLET BY MOUTH EVERY DAY IN THE MORNING 90 tablet 3  . vitamin B-12 (CYANOCOBALAMIN) 50 MCG tablet Take 50 mcg by mouth daily.    . Vitamin D, Cholecalciferol, 10 MCG (400 UNIT) CAPS Take by mouth daily.    . Vitamin E 100 units TABS Take by mouth daily.     No current facility-administered medications on file prior to visit.    Radiology:  No results found.  Cardiac Studies:   Lower extremity arterial duplex 07/06/2017 No hemodynamically significant stenoses are identified in the right lower extremity arterial system. Significant velocity increase at the left distal superficial femoral artery suggestive of >50% stenosis. This  exam reveals moderately decreased perfusion of the lower extremity, RABI 0.76 and LABI 0.76. 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 Compared to the study done on 03/06/2014, bilateral ABI has decreased from normal. No change in the left mid SFA stenosis.  Right and left heart catheterization 07/12/2017:  Normal to mild Mild elevation in pulmonary artery pressure, normal LVEDP, PA pressure 35/12, mean 23 mmHg. Mild noncritical coronary artery disease, ostial ramus 30% calcific stenosis, otherwise 10-20% luminal irregularity in all 3 vessels. Mild disease in the RCA. Aortic valve  could not be crossed. Heavily calcified, mild aortic root dilatation and AV appears to be bicuspid.  Carotid artery duplex 06/02/2020:  No hemodynamically significant arterial disease in the internal carotid  artery bilaterally.  Antegrade right vertebral artery flow. Antegrade left vertebral artery  flow.  Echocardiogram 06/02/2020:  Left ventricle cavity is normal in size. Moderate concentric hypertrophy of the left ventricle. Abnormal septal wall motion due to post-operative valve. Normal LV systolic function with visual EF 50-55%. Doppler evidence of grade I (impaired) diastolic dysfunction, normal LAP.  Left atrial cavity is moderately dilated.  S/p 21-mm pericardial Magna Ease valve placement (2018). Normal functioning valve without any paravalular leak, dehiscence. Mean PG 15 mmHg is acceptable. likely normal for post-op status.  Mild tricuspid regurgitation. No evidence of pulmonary hypertension.  Compared to previous study on 07/06/2017, aortic valve replacement is new.   Scheduled Remote pacemaker check 04/14/2021:  No AHR or VHR episodes. No further NSVT since last transmission.  Battery longevity is >6 years. RA pacing is 13 %, RV pacing is 99.9 %.  Normal pacemaker function.  EKG:   EKG 04/22/2021: Atrially paced rhythm at rate of 60 bpm, probably ventricularly paced rhythm as well.   No significant change from EKG 06/16/2020, 12/17/2019.   Assessment     ICD-10-CM   1. Atherosclerosis of native artery of both lower extremities with intermittent claudication (HCC)  I70.213 EKG 12-Lead  2. S/P AVR with 21 mm Magna Ease Pericardial Tissue Valve,  09/18/2017: Karsten Ro, MD  Z95.2   3. Pacemaker Medtronic Azure XT DR MRI [id number] Dual chamber pacemaker in situ  Z95.0   4. Essential hypertension  I10 Basic metabolic panel    TSH  5. Type 2 diabetes mellitus with complication, without long-term current use of insulin (HCC)  E11.8 TSH    Recommendations:   No orders of the  defined types were placed in this encounter.   Nataniel OWYNN MOSQUEDA  is a 70 y.o. male  status post aortic valve replacement using 21 mm pericardial Magna Ease valve for severe aortic stenosis 09/29/2017, Medtronic dual-chamber pacemaker for complete heart block status post aortic valve replacement 10/03/2017. He has hypertension, Mixed hyperlipidemia, PAD.   With regard to peripheral arterial disease, Symptoms are remained stable, there is no critical limb ischemia.  Capillary refill is normal bilaterally.  His symptoms of leg cramps and tingling and numbness is also combination of peripheral neuropathy from diabetes mellitus.  I noticed that his serum creatinine has slowly risen, will repeat BMP and also get TSH.  Blood pressure is controlled. Lipids are well controlled, weight loss was discussed with the patient. Pacemaker is functioning normally.     Yates Decamp, MD, [facility] 04/23/2021, 9:51 PM Office: [phone] Pager: [phone]

## 2021-04-22 NOTE — Telephone Encounter (Signed)
I let patient know during office visit.

## 2021-06-08 ENCOUNTER — Other Ambulatory Visit: Payer: Self-pay | Admitting: Medical

## 2021-06-17 ENCOUNTER — Ambulatory Visit: Payer: Medicare HMO | Admitting: Cardiology

## 2021-07-15 DIAGNOSIS — Z950 Presence of cardiac pacemaker: Secondary | ICD-10-CM | POA: Diagnosis not present

## 2021-07-15 DIAGNOSIS — Z45018 Encounter for adjustment and management of other part of cardiac pacemaker: Secondary | ICD-10-CM | POA: Diagnosis not present

## 2021-07-15 DIAGNOSIS — I442 Atrioventricular block, complete: Secondary | ICD-10-CM | POA: Diagnosis not present

## 2021-07-16 DIAGNOSIS — H401112 Primary open-angle glaucoma, right eye, moderate stage: Secondary | ICD-10-CM | POA: Diagnosis not present

## 2021-07-16 DIAGNOSIS — Z961 Presence of intraocular lens: Secondary | ICD-10-CM | POA: Diagnosis not present

## 2021-07-29 ENCOUNTER — Other Ambulatory Visit: Payer: Self-pay | Admitting: Cardiology

## 2021-07-29 DIAGNOSIS — I10 Essential (primary) hypertension: Secondary | ICD-10-CM

## 2021-08-28 ENCOUNTER — Other Ambulatory Visit: Payer: Self-pay

## 2021-08-28 ENCOUNTER — Encounter (HOSPITAL_COMMUNITY): Payer: Self-pay | Admitting: *Deleted

## 2021-08-28 ENCOUNTER — Emergency Department (HOSPITAL_COMMUNITY)
Admission: EM | Admit: 2021-08-28 | Discharge: 2021-08-28 | Disposition: A | Discharge status: Home / Self Care | Payer: Medicare HMO | Attending: Emergency Medicine | Admitting: Emergency Medicine

## 2021-08-28 DIAGNOSIS — Y92009 Unspecified place in unspecified non-institutional (private) residence as the place of occurrence of the external cause: Secondary | ICD-10-CM | POA: Diagnosis not present

## 2021-08-28 DIAGNOSIS — Z7984 Long term (current) use of oral hypoglycemic drugs: Secondary | ICD-10-CM | POA: Diagnosis not present

## 2021-08-28 DIAGNOSIS — Z79899 Other long term (current) drug therapy: Secondary | ICD-10-CM | POA: Diagnosis not present

## 2021-08-28 DIAGNOSIS — W25XXXA Contact with sharp glass, initial encounter: Secondary | ICD-10-CM | POA: Diagnosis not present

## 2021-08-28 DIAGNOSIS — Z955 Presence of coronary angioplasty implant and graft: Secondary | ICD-10-CM | POA: Insufficient documentation

## 2021-08-28 DIAGNOSIS — E1141 Type 2 diabetes mellitus with diabetic mononeuropathy: Secondary | ICD-10-CM | POA: Insufficient documentation

## 2021-08-28 DIAGNOSIS — Y93E5 Activity, floor mopping and cleaning: Secondary | ICD-10-CM | POA: Insufficient documentation

## 2021-08-28 DIAGNOSIS — S81811A Laceration without foreign body, right lower leg, initial encounter: Secondary | ICD-10-CM | POA: Diagnosis not present

## 2021-08-28 DIAGNOSIS — S8991XA Unspecified injury of right lower leg, initial encounter: Secondary | ICD-10-CM | POA: Diagnosis not present

## 2021-08-28 DIAGNOSIS — Z87891 Personal history of nicotine dependence: Secondary | ICD-10-CM | POA: Insufficient documentation

## 2021-08-28 DIAGNOSIS — Z950 Presence of cardiac pacemaker: Secondary | ICD-10-CM | POA: Diagnosis not present

## 2021-08-28 DIAGNOSIS — Z7982 Long term (current) use of aspirin: Secondary | ICD-10-CM | POA: Insufficient documentation

## 2021-08-28 DIAGNOSIS — I10 Essential (primary) hypertension: Secondary | ICD-10-CM | POA: Insufficient documentation

## 2021-08-28 MED ORDER — LIDOCAINE-EPINEPHRINE (PF) 2 %-1:200000 IJ SOLN
10.0000 mL | Freq: Once | INTRAMUSCULAR | Status: AC
  Administered 2021-08-28: 10 mL
  Filled 2021-08-28: qty 20

## 2021-08-28 NOTE — ED Triage Notes (Signed)
The pt qwas cut by a falling piece of glass one hour ago while moving  ,small amount of bleeding at present  bandaged at tgriage

## 2021-08-28 NOTE — ED Provider Notes (Signed)
Emergency Medicine Provider Triage Evaluation Note  Jonathan Brown , a 70 y.o. male  was evaluated in triage.  Pt complains of right leg wound. Patient was changing a photo in a frame when the glass fell and sliced his leg. Last tetanus 3 years ago. While evaluating patient had presyncopal episode in triage.   Review of Systems  Positive: Right lower leg wound, lightheadedness Negative: Falls, head trauma  Physical Exam  BP 100/70 (BP Location: Right Arm)   Pulse 80   Temp 98.4 F (36.9 C) (Oral)   Resp 16   Ht 5\' 9"  (1.753 m)   Wt 90.3 kg   SpO2 96%   BMI 29.40 kg/m  Gen:   Awake, no distress   Resp:  Normal effort  MSK:   Moves extremities without difficulty  Other:  Approximately 3 cm laceration to right lateral lower leg, bleeding is controlled, good pulses and capillary refill  Medical Decision Making  Medically screening exam initiated at 3:26 PM.  Appropriate orders placed.  was informed that the remainder of the evaluation will be completed by another provider, this initial triage assessment does not replace that evaluation, and the importance of remaining in the ED until their evaluation is complete.   Enedina Finner 08/28/21 1528    10/28/21, MD 08/28/21 [phone]

## 2021-08-28 NOTE — ED Provider Notes (Signed)
[facility] EMERGENCY DEPARTMENT Provider Note   CSN: [id number] Arrival date & time: 08/28/21  1503     History Chief Complaint  Patient presents with   Laceration    Jonathan Brown is a 70 y.o. male.  70 year old male presents with laceration to the right lower leg.  Past medical history of diabetes, last tetanus less than 5 years ago.  Patient states that they were doing some fall cleaning in the house when his leg hit a photo frame, the glass broke, resulting in laceration to his right lower leg.  Bleeding is controlled, not anticoagulated, no difficulty ambulating.  No other injuries, complaints, concerns.      Past Medical History:  Diagnosis Date   Arthritis    Cataract 02/26/2021   Bilateral   CHB (complete heart block) (HCC) 10/05/2017   Diabetes mellitus without complication (HCC) 2014   type 2    Encounter for care of pacemaker 07/20/2019   Former smoker    GERD (gastroesophageal reflux disease)    Hyperlipidemia    Hypertension 2012   Open-angle glaucoma 02/26/2021   Pacemaker Medtronic Azure XT DR MRI [id number] Dual chamber pacemaker in situ 10/03/2017   PVD (peripheral vascular disease) (HCC)    S/P AVR    Aortic valve replacement using a 21-mm pericardial Magna Ease (09/29/2017 by Dr. 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    wife is able to read to him    Patient Active Problem List   Diagnosis Date Noted   Encounter for health maintenance examination in adult 09/09/2020   Hyperlipidemia associated with type 2 diabetes mellitus (HCC) 09/09/2020   Encounter for care of pacemaker 07/20/2019   Medicare annual wellness visit, subsequent 06/18/2019   Advanced directives, counseling/discussion 06/18/2019   PVD (peripheral vascular disease) (HCC) 06/15/2018   CHB (complete heart block) (HCC) 10/05/2017   Pacemaker Medtronic Azure XT  DR MRI [id number] Dual chamber pacemaker in situ 10/03/2017   S/P AVR 09/29/2017   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   Pleural plaque 06/14/2017   Screening for prostate cancer 06/14/2017   Former smoker 06/14/2017   Vaccine counseling 03/14/2017   Poor diet 11/04/2016   Diabetic mononeuropathy associated with type 2 diabetes mellitus (HCC) 02/03/2016   Gastroesophageal reflux disease without esophagitis 02/03/2016   Hyperlipidemia 02/03/2016   Essential hypertension 02/03/2016   Type 2 diabetes mellitus with complication, without long-term current use of insulin (HCC) 02/03/2016   Need for prophylactic vaccination and inoculation against influenza 02/03/2016   Atherosclerosis of native artery of both lower extremities with intermittent claudication (HCC) 02/03/2016    Past Surgical History:  Procedure Laterality Date   AORTIC VALVE REPLACEMENT N/A 09/29/2017   Procedure: AORTIC VALVE REPLACEMENT (AVR) using 32mm Magna Ease valve;  Surgeon: Kerin Perna, MD;  Location: [facility] OR;  Service: Open Heart Surgery;  Laterality: N/A;   CARDIAC CATHETERIZATION  07/12/2017   COLONOSCOPY  2008   LEFT AND RIGHT HEART CATHETERIZATION WITH CORONARY ANGIOGRAM N/A 04/02/2014   Procedure: LEFT AND RIGHT HEART CATHETERIZATION WITH CORONARY ANGIOGRAM;  Surgeon: Pamella Pert, MD;  Location: [facility] CATH LAB;  Service: Cardiovascular;  Laterality: N/A;   PACEMAKER IMPLANT N/A 10/03/2017   Procedure: PACEMAKER IMPLANT;  Surgeon: Marinus Maw, MD;  Location: [facility] INVASIVE CV LAB;  Service: Cardiovascular;  Laterality: N/A;   TEE WITHOUT CARDIOVERSION  N/A 04/30/2014   TEE WITHOUT CARDIOVERSION N/A 09/29/2017   Procedure: TRANSESOPHAGEAL ECHOCARDIOGRAM (TEE);  Surgeon: Donata Clay, Theron Arista, MD;  Location: [facility] OR;  Service: Open Heart Surgery;  Laterality: N/A;       Family History  Problem Relation Age of Onset   Other Mother        died of old age   Heart disease Father    Sleep apnea Sister    Diabetes Brother         diabetic coma x 3 years   Alcohol abuse Brother    Heart disease Brother 49       MI   Cancer Neg Hx    Stroke Neg Hx     Social History   Tobacco Use   Smoking status: Former    Packs/day: 0.25    Years: 30.00    Pack years: 7.50    Types: Cigarettes    Quit date: 09/28/2007    Years since quitting: 13.9   Smokeless tobacco: Never  Vaping Use   Vaping Use: Never used  Substance Use Topics   Alcohol use: No   Drug use: Yes    Frequency: 7.0 times per week    Types: Marijuana    Comment: 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 Authorizing Provider  acetaminophen (TYLENOL) 500 MG tablet Take 1,000 mg by mouth daily as needed for moderate pain.    [provider]  amLODipine (NORVASC) 10 MG tablet TAKE 1 TABLET BY MOUTH EVERY DAY 07/29/21   Yates Decamp, MD  aspirin EC 81 MG tablet Take 1 tablet (81 mg total) by mouth daily. 09/10/20   Tysinger, Kermit Balo, PA-C  glucose blood test strip 100 each by Other route daily. Use as instructed   Dispense 100 box   12 refills 09/10/20   Tysinger, Kermit Balo, PA-C  hydrocortisone 2.5 % cream Apply topically 2 (two) times daily. 12/11/20   Tysinger, Kermit Balo, PA-C  JARDIANCE 10 MG TABS tablet TAKE 1 TABLET BY MOUTH EVERY DAY BEFORE BREAKFAST 06/08/21   Tysinger, Kermit Balo, PA-C  Lancets [facility] ULTRASOFT) lancets Use as instructed 09/10/20   Tysinger, Kermit Balo, PA-C  Lancets MISC Use as instructed. Needs lancets based on pt's meter. Needs one touch verio lancets 09/10/20   Tysinger, Kermit Balo, PA-C  latanoprost (XALATAN) 0.005 % ophthalmic solution INSTILL 1 DROP INTO BOTH EYES EVERY DAY AT NIGHT 07/22/20   [provider]  metFORMIN (GLUCOPHAGE) 500 MG tablet TAKE ONE TABLET BY MOUTH TWICE DAILY WITH  MEAL 09/10/20   Tysinger, Kermit Balo, PA-C  metoprolol tartrate (LOPRESSOR) 50 MG tablet Take 1 tablet (50 mg total) by mouth 2 (two) times  daily. 09/10/20   Tysinger, Kermit Balo, PA-C  omeprazole (PRILOSEC) 40 MG capsule TAKE 1 CAPSULE BY MOUTH EVERY DAY 12/11/20   Tysinger, Kermit Balo, PA-C  rosuvastatin (CRESTOR) 20 MG tablet TAKE 1 TABLET BY MOUTH EVERYDAY AT BEDTIME 09/10/20   Tysinger, Kermit Balo, PA-C  Tetrahydrozoline HCl (VISINE OP) Apply 1 drop to eye daily as needed (dry eyes).    [provider]  valsartan-hydrochlorothiazide (DIOVAN-HCT) 160-12.5 MG tablet TAKE 1 TABLET BY MOUTH EVERY DAY IN THE MORNING 09/10/20   Tysinger, Kermit Balo, PA-C  vitamin B-12 (CYANOCOBALAMIN) 50 MCG tablet Take 50 mcg by mouth daily.    [provider]  Vitamin D, Cholecalciferol, 10 MCG (400  UNIT) CAPS Take by mouth daily.    [provider]  Vitamin E 100 units TABS Take by mouth daily.    [provider]    Allergies    Patient has no known allergies.  Review of Systems   Review of Systems  Constitutional:  Negative for fever.  Musculoskeletal:  Negative for arthralgias, joint swelling and myalgias.  Skin:  Positive for wound. Negative for color change and rash.  Allergic/Immunologic: Negative for immunocompromised state.  Neurological:  Negative for weakness and numbness.  Hematological:  Does not bruise/bleed easily.  Psychiatric/Behavioral:  Negative for self-injury.   All other systems reviewed and are negative.  Physical Exam Updated Vital Signs BP 100/70 (BP Location: Right Arm)   Pulse 80   Temp 98.4 F (36.9 C) (Oral)   Resp 16   Ht 5\' 9"  (1.753 m)   Wt 90.3 kg   SpO2 96%   BMI 29.40 kg/m   Physical Exam Vitals and nursing note reviewed.  Constitutional:      General: He is not in acute distress.    Appearance: He is well-developed. He is not diaphoretic.  HENT:     Head: Normocephalic and atraumatic.  Cardiovascular:     Pulses: Normal pulses.  Pulmonary:     Effort: Pulmonary effort is normal.  Musculoskeletal:        General: Tenderness and signs of injury present. No swelling or  deformity. Normal range of motion.     Comments: Approximately 5 cm laceration to right lower leg anterior lateral aspect.  No active bleeding.  Sensation intact distal to wound, normal range of motion foot and ankle.  Skin:    General: Skin is warm and dry.     Findings: No erythema or rash.  Neurological:     Mental Status: He is alert and oriented to person, place, and time.     Sensory: No sensory deficit.     Motor: No weakness.  Psychiatric:        Behavior: Behavior normal.    ED Results / Procedures / Treatments   Labs (all labs ordered are listed, but only abnormal results are displayed) Labs Reviewed - No data to display  EKG None  Radiology No results found.  Procedures . Laceration Repair  Date/Time: 08/28/2021 6:43 PM Performed by: 10/28/2021, PA-C Authorized by: Jeannie Fend, PA-C   Consent:    Consent obtained:  Verbal   Consent given by:  Patient   Risks discussed:  Infection, need for additional repair, pain, poor cosmetic result and poor wound healing   Alternatives discussed:  No treatment and delayed treatment Universal protocol:    Procedure explained and questions answered to patient or proxy's satisfaction: yes     Relevant documents present and verified: yes     Test results available: yes     Imaging studies available: yes     Required blood products, implants, devices, and special equipment available: yes     Site/side marked: yes     Immediately prior to procedure, a time out was called: yes     Patient identity confirmed:  Verbally with patient Anesthesia:    Anesthesia method:  Local infiltration   Local anesthetic:  Lidocaine 2% WITH epi Laceration details:    Location:  Leg   Leg location:  R lower leg   Length (cm):  5   Depth (mm):  5 Pre-procedure details:    Preparation:  Patient was prepped and draped in  usual sterile fashion Exploration:    Hemostasis achieved with:  Epinephrine   Wound exploration: wound explored  through full range of motion and entire depth of wound visualized     Wound extent: no foreign bodies/material noted and no muscle damage noted   Treatment:    Area cleansed with:  Saline   Amount of cleaning:  Extensive   Irrigation solution:  Sterile saline Skin repair:    Repair method:  Staples   Number of staples:  7 Approximation:    Approximation:  Close Repair type:    Repair type:  Simple Post-procedure details:    Dressing:  Bulky dressing   Procedure completion:  Tolerated well, no immediate complications   Medications Ordered in ED Medications  lidocaine-EPINEPHrine (XYLOCAINE W/EPI) 2 %-1:200000 (PF) injection 10 mL (has no administration in time range)    ED Course  I have reviewed the triage vital signs and the nursing notes.  Pertinent labs & imaging results that were available during my care of the patient were reviewed by me and considered in my medical decision making (see chart for details).  Clinical Course as of 08/28/21 1845  Fri Aug 28, 2021  [id number] 70 year old male with laceration of right lower leg as above. Found to have 5 cm linear laceration to the anterolateral right lower leg.  Wound was anesthetized, thoroughly irrigated and closed with staples.  Offered to x-ray leg to further evaluate for foreign body however patient has declined.-Possibility of retained fragments of glass, patient is aware, recheck with PCP and follow-up with PCP for staple removal in 10 days. [LM]    Clinical Course User Index [LM] Alden HippMurphy, Gunter Conde A, PA-C   MDM Rules/Calculators/A&P                           Final Clinical Impression(s) / ED Diagnoses Final diagnoses:  Laceration of right lower extremity, initial encounter    Rx / DC Orders ED Discharge Orders     None        Jeannie FendMurphy, Angelica Frandsen A, PA-C 08/28/21 1845    Pricilla LovelessGoldston, Scott, MD 08/30/21 1521

## 2021-08-28 NOTE — ED Notes (Signed)
Wound re wrapped per provider instruction, bleeding controlled, patient tolerated well.

## 2021-08-28 NOTE — ED Notes (Signed)
ED Provider at bedside. 

## 2021-08-28 NOTE — ED Notes (Signed)
ED Provider at bedside for suture procedure 

## 2021-09-01 ENCOUNTER — Other Ambulatory Visit: Payer: Self-pay

## 2021-09-01 ENCOUNTER — Ambulatory Visit (INDEPENDENT_AMBULATORY_CARE_PROVIDER_SITE_OTHER): Payer: Medicare HMO | Admitting: Medical

## 2021-09-01 VITALS — BP 120/78 | HR 70 | Wt 192.6 lb

## 2021-09-01 DIAGNOSIS — E118 Type 2 diabetes mellitus with unspecified complications: Secondary | ICD-10-CM

## 2021-09-01 DIAGNOSIS — I70213 Atherosclerosis of native arteries of extremities with intermittent claudication, bilateral legs: Secondary | ICD-10-CM

## 2021-09-01 DIAGNOSIS — I10 Essential (primary) hypertension: Secondary | ICD-10-CM

## 2021-09-01 DIAGNOSIS — E1141 Type 2 diabetes mellitus with diabetic mononeuropathy: Secondary | ICD-10-CM | POA: Diagnosis not present

## 2021-09-01 DIAGNOSIS — E1169 Type 2 diabetes mellitus with other specified complication: Secondary | ICD-10-CM

## 2021-09-01 DIAGNOSIS — I739 Peripheral vascular disease, unspecified: Secondary | ICD-10-CM

## 2021-09-01 DIAGNOSIS — Z23 Encounter for immunization: Secondary | ICD-10-CM | POA: Diagnosis not present

## 2021-09-01 DIAGNOSIS — Z550 Illiteracy and low-level literacy: Secondary | ICD-10-CM | POA: Diagnosis not present

## 2021-09-01 DIAGNOSIS — E785 Hyperlipidemia, unspecified: Secondary | ICD-10-CM

## 2021-09-01 DIAGNOSIS — R5383 Other fatigue: Secondary | ICD-10-CM | POA: Diagnosis not present

## 2021-09-01 DIAGNOSIS — S81811S Laceration without foreign body, right lower leg, sequela: Secondary | ICD-10-CM | POA: Insufficient documentation

## 2021-09-01 NOTE — Progress Notes (Signed)
Subjective:  Jonathan Brown is a 70 y.o. male who presents for Chief Complaint  Patient presents with   ER follow-up    ER follow-up /     Here for wound check.  Was seen in the emergency department on September 2 for injury.  Glass broke out of a picture frame this past week cutting his right lower leg.  He had to have staples placed.  He had quite a bit of bleeding.  Doing okay.  He has been using a bandage daily.  No fever no swelling except for the foot on the right.  No pus.  Feels fine otherwise.  He was mainly scheduled for wound check today but he has not been in for diabetes check in several months  Diabetes- He is compliant with medications.  Home blood sugars have been low 100s recently.  He denies any polydipsia or polyuria.  No blurred vision.  Hypertension-compliant with medication without complaint  Hyperlipidemia-compliant with medication without complaint.  He is nonfasting today.  He notes some fatigue but no shortness of breath, no chest pain.  No other aggravating or relieving factors.    No other c/o.  The following portions of the patient's history were reviewed and updated as appropriate: allergies, current medications, past family history, past medical history, past social history, past surgical history and problem list.  ROS Otherwise as in subjective above  Objective: BP 120/78   Pulse 70   Wt 192 lb 9.6 oz (87.4 kg)   BMI 28.44 kg/m   General appearance: alert, no distress, well developed, well nourished Lungs clear Heart with 2 out of 6 brief murmur systolic heard in upper sternal borders, otherwise regular rate and rhythm 1+ pedal pulses There is mild swelling of the right foot anteriorly.  Otherwise foot nontender Right lower leg laterally above the ankle with horizontal laceration with 7 staples in place.  No localized fluctuance warmth redness or discharge.  Foot and leg range of motion within normal limits Foot sensation decreased in  general due to neuropathy but otherwise nontender     Assessment: Encounter Diagnoses  Name Primary?   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  I reviewed the emergency department notes from September 2.  Seems to be doing okay and healing appropriately.  He will come in on Monday, September 12 for staple removal.  In the meantime he will watch for any signs of infection that would prompt a recheck.  We discussed wound care.  He is past due for follow-up on diabetes.  We went ahead and did blood work today.  Continue current medication  Hypertension-controlled, continue current medication  Hyperlipidemia-continue current medication.  He is nonfasting today so we will hold off on lipid panel today  Atherosclerosis and heart disease-I reviewed his April 2022 cardiology notes.  PVD-once his wound has healed, I recommend updated ABIs in the coming month or 2  Fatigue-unclear etiology.  Baseline labs today  Counseled on the influenza virus vaccine.  Vaccine information sheet given.   High dose Influenza vaccine given after consent obtained.   Belal was seen today for er follow-up.  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

## 2021-09-02 LAB — RENAL FUNCTION PANEL
Albumin: 4.6 g/dL (ref 3.8–4.8)
BUN/Creatinine Ratio: 15 (ref 10–24)
BUN: 19 mg/dL (ref 8–27)
CO2: 18 mmol/L — ABNORMAL LOW (ref 20–29)
Calcium: 10 mg/dL (ref 8.6–10.2)
Chloride: 102 mmol/L (ref 96–106)
Creatinine, Ser: 1.23 mg/dL (ref 0.76–1.27)
Glucose: 162 mg/dL — ABNORMAL HIGH (ref 65–99)
Phosphorus: 2.5 mg/dL — ABNORMAL LOW (ref 2.8–4.1)
Potassium: 3.5 mmol/L (ref 3.5–5.2)
Sodium: 138 mmol/L (ref 134–144)
eGFR: 64 mL/min/{1.73_m2} (ref >59)

## 2021-09-02 LAB — HEMOGLOBIN A1C
Est. average glucose Bld gHb Est-mCnc: 137 mg/dL
Hgb A1c MFr Bld: 6.4 % — ABNORMAL HIGH (ref 4.8–5.6)

## 2021-09-02 LAB — MICROALBUMIN / CREATININE URINE RATIO
Creatinine, Urine: 62.1 mg/dL
Microalb/Creat Ratio: <5 mg/g creat (ref 0–29)
Microalbumin, Urine: <3 ug/mL

## 2021-09-02 LAB — HEPATIC FUNCTION PANEL
ALT: 17 IU/L (ref 0–44)
AST: 19 IU/L (ref 0–40)
Alkaline Phosphatase: 87 IU/L (ref 44–121)
Bilirubin Total: 0.3 mg/dL (ref 0.0–1.2)
Bilirubin, Direct: <0.1 mg/dL (ref 0.00–0.40)
Total Protein: 6.7 g/dL (ref 6.0–8.5)

## 2021-09-02 LAB — TSH: TSH: 2.35 u[IU]/mL (ref 0.450–4.500)

## 2021-09-05 ENCOUNTER — Other Ambulatory Visit: Payer: Self-pay | Admitting: Medical

## 2021-09-07 ENCOUNTER — Other Ambulatory Visit: Payer: Self-pay | Admitting: Medical

## 2021-09-07 ENCOUNTER — Other Ambulatory Visit: Payer: Self-pay

## 2021-09-07 ENCOUNTER — Ambulatory Visit (INDEPENDENT_AMBULATORY_CARE_PROVIDER_SITE_OTHER): Payer: Medicare HMO | Admitting: Medical

## 2021-09-07 VITALS — Temp 98.1°F

## 2021-09-07 DIAGNOSIS — Z5189 Encounter for other specified aftercare: Secondary | ICD-10-CM

## 2021-09-07 DIAGNOSIS — E118 Type 2 diabetes mellitus with unspecified complications: Secondary | ICD-10-CM

## 2021-09-07 DIAGNOSIS — L602 Onychogryphosis: Secondary | ICD-10-CM | POA: Diagnosis not present

## 2021-09-07 DIAGNOSIS — S81811S Laceration without foreign body, right lower leg, sequela: Secondary | ICD-10-CM

## 2021-09-07 DIAGNOSIS — E1141 Type 2 diabetes mellitus with diabetic mononeuropathy: Secondary | ICD-10-CM | POA: Diagnosis not present

## 2021-09-07 DIAGNOSIS — I10 Essential (primary) hypertension: Secondary | ICD-10-CM

## 2021-09-07 MED ORDER — ROSUVASTATIN CALCIUM 20 MG PO TABS: ORAL_TABLET | ORAL | 3 refills | Status: DC

## 2021-09-07 MED ORDER — AMOXICILLIN-POT CLAVULANATE 875-125 MG PO TABS: 1.0000 | ORAL_TABLET | Freq: Two times a day (BID) | ORAL | 0 refills | Status: DC

## 2021-09-07 MED ORDER — ASPIRIN EC 81 MG PO TBEC: 81.0000 mg | DELAYED_RELEASE_TABLET | Freq: Every day | ORAL | 3 refills | Status: DC

## 2021-09-07 MED ORDER — METFORMIN HCL 500 MG PO TABS: ORAL_TABLET | ORAL | 3 refills | Status: DC

## 2021-09-07 MED ORDER — METOPROLOL TARTRATE 50 MG PO TABS: 50.0000 mg | ORAL_TABLET | Freq: Two times a day (BID) | ORAL | 3 refills | Status: DC

## 2021-09-07 MED ORDER — VALSARTAN-HYDROCHLOROTHIAZIDE 160-12.5 MG PO TABS: ORAL_TABLET | ORAL | 3 refills | Status: DC

## 2021-09-07 NOTE — Progress Notes (Signed)
Subjective:  Jonathan Brown is a 70 y.o. male who presents for Chief Complaint  Patient presents with   Suture / Staple Removal    Staple removal. Foot is swollen. Area is red and warm to touch      Here for wound check.  Was seen in the emergency department on September 2 for injury.  Glass broke out of a picture frame this past week cutting his right lower leg.  He had to have staples placed.  He had quite a bit of bleeding.  Doing okay.  He has been using a bandage daily.  No fever, but still has some swelling of the right foot.  No pus.  Feels fine otherwise.  Wants help with thickened toenails.  Diabetes- He is compliant with medications.  Home blood sugars have been low 100s recently.  He denies any polydipsia or polyuria.  No blurred vision.  No other aggravating or relieving factors.    No other c/o.  The following portions of the patient's history were reviewed and updated as appropriate: allergies, current medications, past family history, past medical history, past social history, past surgical history and problem list.  ROS Otherwise as in subjective above  Objective: Temp 98.1 F (36.7 C)   General appearance: alert, no distress, well developed, well nourished 1+ pedal pulses There is mild swelling of the right foot anteriorly.  Otherwise foot nontender Right lower leg laterally above the ankle with horizontal laceration with 7 staples in place.  There is slight warmth and erythema right along the wound.  Otherwise no induration, no discharge.  Foot and leg range of motion within normal limits Foot sensation decreased in general due to neuropathy but otherwise nontender     Assessment: Encounter Diagnoses  Name Primary?   Type 2 diabetes mellitus with complication, without long-term current use of insulin (HCC) Yes   Visit for wound check    Nail hypertrophy    Diabetic mononeuropathy associated with type 2 diabetes mellitus (HCC)    Leg laceration, right,  sequela       Plan: I reviewed back or the hospital emergency department notes and recommendations and visit notes from last week.  Cleaned and prepped right lower leg.  Removed 7 staples.  Clean the leg and applied Steri-Strips.  Advised he call right away or recheck if any signs of worse redness, pain, fever, pus drainage.  Begin antibiotic as below since there was some redness and swelling today.  I asked him to recheck by Friday of this week within 5 days to give me an update on the healing.  I would like to see him back in the office in 12 to 14 days assuming things go well this week.  I asked him not to be moving the foot and ankle a whole lot this week to give the foot more time to heal given it was a fairly deep wound.  He will focus on relative rest, leg elevation, ice pack cool therapy with a towel in between the ice and his foot doing this 20 minutes 3 times per day  Referral to podiatry for diabetic nail care and foot care  Jonathan Brown was seen today for suture / staple removal.  Diagnoses and all orders for this visit:  Type 2 diabetes mellitus with complication, without long-term current use of insulin (HCC)  Visit for wound check -     DG Tibia/Fibula Right; Future  Nail hypertrophy -     Ambulatory referral to Podiatry  Diabetic mononeuropathy associated with type 2 diabetes mellitus (HCC)  Leg laceration, right, sequela -     DG Tibia/Fibula Right; Future  Other orders -     amoxicillin-clavulanate (AUGMENTIN) 875-125 MG tablet; Take 1 tablet by mouth 2 (two) times daily.    Follow up: 5 days

## 2021-09-10 ENCOUNTER — Ambulatory Visit: Payer: Medicare HMO | Admitting: Medical

## 2021-09-15 ENCOUNTER — Other Ambulatory Visit: Payer: Self-pay

## 2021-09-15 ENCOUNTER — Ambulatory Visit: Payer: Medicare HMO | Admitting: Podiatrist

## 2021-09-15 ENCOUNTER — Encounter: Payer: Self-pay | Admitting: Podiatrist

## 2021-09-15 DIAGNOSIS — B351 Tinea unguium: Secondary | ICD-10-CM

## 2021-09-15 DIAGNOSIS — M79609 Pain in unspecified limb: Secondary | ICD-10-CM | POA: Diagnosis not present

## 2021-09-15 DIAGNOSIS — I739 Peripheral vascular disease, unspecified: Secondary | ICD-10-CM

## 2021-09-15 NOTE — Patient Instructions (Signed)

## 2021-09-17 ENCOUNTER — Ambulatory Visit (INDEPENDENT_AMBULATORY_CARE_PROVIDER_SITE_OTHER): Payer: Medicare HMO | Admitting: Medical

## 2021-09-17 ENCOUNTER — Other Ambulatory Visit: Payer: Self-pay

## 2021-09-17 DIAGNOSIS — E569 Vitamin deficiency, unspecified: Secondary | ICD-10-CM

## 2021-09-17 NOTE — Progress Notes (Signed)
Subjective:  Jonathan Brown is a 70 y.o. male who presents for Chief Complaint  Patient presents with   follow-up    Follow-up on leg. No pain. Swelling and redness has gone down.       Here for wound check.  At his most recent visit on September 12 we removed sutures and changed to Steri-Strips.  He still had localized swelling at that time and redness so I put him on antibiotic.  He is on the last day of his antibiotic.  He notes much improvement since last visit.  No more swelling.  The wound is closing up better.  The Steri-Strip stayed on about 4 days.  He is keeping it clean.  Was seen in the emergency department on September 2 for injury.  Glass broke out of a picture frame this past week cutting his right lower leg.  He had to have staples placed.  He had quite a bit of bleeding.    At his last visit I also check some blood work.  His phosphorus was found to be low.  I asked him to come back to check some additional things today.  We also want him to come in to discuss his labs from last visit.  No other aggravating or relieving factors.    No other c/o.  The following portions of the patient's history were reviewed and updated as appropriate: allergies, current medications, past family history, past medical history, past social history, past surgical history and problem list.  ROS Otherwise as in subjective above  Objective: There were no vitals taken for this visit.  General appearance: alert, no distress, well developed, well nourished 1+ pedal pulses Today of the right lateral lower leg has a wound that is healing nicely.  No swelling or redness like last visit.  Is about a 3 cm linear area of granulation tissue x1 cm, but the surrounding tissue is scabbing and looks to be healing nicely. No warmth no fluctuance no induration.  No redness. Otherwise foot nontender with normal range of motion of ankle.    Assessment: Encounter Diagnoses  Name Primary?   Low  phosphate levels Yes   Vitamin deficiency       Plan: Wound looks much better.  We discussed the following recommendations Patient Instructions  Recommendations:  Your wound looks much better   Start doing some stretching exercises with the foot and ankle now During the day don't cover the wound.  Let it air out At night use Neosporin or Triple antibiotic ointment for another 5-7 days After the central part scabs over, then start using Vitamin E ointment over the counter or cocoa butter daily at nighttime until the skin heals back up to normal appearing  We reviewed his lab work from last visit.  I am going to check his vitamin D today.  I suspect this could be low which could be causing some lab fluctuations from last visit.  His other labs are stable.  Continue current medications.  [name] was seen today for follow-up.  Diagnoses and all orders for this visit:  Low phosphate levels -     VITAMIN D 25 Hydroxy (Vit-D Deficiency, Fractures)  Vitamin deficiency -     VITAMIN D 25 Hydroxy (Vit-D Deficiency, Fractures)    Follow up: pending labs

## 2021-09-17 NOTE — Patient Instructions (Signed)
Recommendations:  Your wound looks much better   Start doing some stretching exercises with the foot and ankle now During the day don't cover the wound.  Let it air out At night use Neosporin or Triple antibiotic ointment for another 5-7 days After the central part scabs over, then start using Vitamin E ointment over the counter or cocoa butter daily at nighttime until the skin heals back up to normal appearing

## 2021-09-18 ENCOUNTER — Other Ambulatory Visit: Payer: Self-pay | Admitting: Medical

## 2021-09-18 LAB — VITAMIN D 25 HYDROXY (VIT D DEFICIENCY, FRACTURES): Vit D, 25-Hydroxy: 32.2 ng/mL (ref 30.0–100.0)

## 2021-09-18 MED ORDER — VITAMIN D 25 MCG (1000 UNIT) PO TABS: 1000.0000 [IU] | ORAL_TABLET | Freq: Every day | ORAL | 3 refills | Status: DC

## 2021-09-18 NOTE — Progress Notes (Signed)
Chief Complaint  Patient presents with   Nail Problem    Nail trim 1-5 bilateral  Diabetic foot exam     HPI: Patient is 70 y.o. male who presents today for a new patient diabetic foot evaluation as well as trimming of the toenails.  He denies any numbness or tingling.  His most recent hemoglobin A1c was 6.4 and he states he is normally in a good range.  When asked he relates pain in the calves after walking several blocks.  He also relates pain in the legs at night which wakes him up..    Past Medical History:  Diagnosis Date   Arthritis    Cataract 02/26/2021   Bilateral   CHB (complete heart block) (HCC) 10/05/2017   Diabetes mellitus without complication (HCC) 2014   type 2    Encounter for care of pacemaker 07/20/2019   Former smoker    GERD (gastroesophageal reflux disease)    Hyperlipidemia    Hypertension 2012   Open-angle glaucoma 02/26/2021   Pacemaker Medtronic Azure XT DR MRI [id number] Dual chamber pacemaker in situ 10/03/2017   PVD (peripheral vascular disease) (HCC)    S/P AVR    Aortic valve replacement using a 21-mm pericardial Magna Ease (09/29/2017 by Dr. 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    wife is able to read to him   Current Outpatient Medications  Medication Instructions   acetaminophen (TYLENOL) 1,000 mg, Oral, Daily PRN   amLODipine (NORVASC) 10 MG tablet TAKE 1 TABLET BY MOUTH EVERY DAY   amoxicillin-clavulanate (AUGMENTIN) 875-125 MG tablet 1 tablet, Oral, 2 times daily   aspirin EC 81 mg, Oral, Daily   cholecalciferol (VITAMIN D3) 1,000 Units, Oral, Daily   glucose blood test strip 100 each, Other, Daily, Use as instructed   Dispense 100 box   12 refills    hydrocortisone 2.5 % cream Topical, 2 times daily   JARDIANCE 10 MG TABS tablet TAKE 1 TABLET BY MOUTH EVERY DAY BEFORE BREAKFAST   Lancets (ONETOUCH ULTRASOFT) lancets Use as instructed    Lancets MISC Use as instructed. Needs lancets based on pt's meter. Needs one touch verio lancets   latanoprost (XALATAN) 0.005 % ophthalmic solution INSTILL 1 DROP INTO BOTH EYES EVERY DAY AT NIGHT   latanoprost (XALATAN) 0.005 % ophthalmic solution Place 1 drop into both eyes nightly.   metFORMIN (GLUCOPHAGE) 500 MG tablet TAKE ONE TABLET BY MOUTH TWICE DAILY WITH  MEAL   metoprolol tartrate (LOPRESSOR) 50 mg, Oral, 2 times daily   omeprazole (PRILOSEC) 40 MG capsule TAKE 1 CAPSULE BY MOUTH EVERY DAY   rosuvastatin (CRESTOR) 20 MG tablet TAKE 1 TABLET BY MOUTH EVERYDAY AT BEDTIME   Tetrahydrozoline HCl (VISINE OP) 1 drop, Ophthalmic, Daily PRN   valsartan-hydrochlorothiazide (DIOVAN-HCT) 160-12.5 MG tablet TAKE 1 TABLET BY MOUTH EVERY DAY IN THE MORNING   vitamin B-12 (CYANOCOBALAMIN) 50 mcg, Oral, Daily   Vitamin D, Cholecalciferol, 10 MCG (400 UNIT) CAPS Oral, Daily   Vitamin E 100 units TABS Oral, Daily    No Known Allergies   Physical Exam  GENERAL APPEARANCE: Alert, conversant. Appropriately groomed. No acute distress.   VASCULAR: Pedal pulses non palple dp or pt right with mild edema right noted.  Pedal pulses faintly palpable at 1/4 left and unable to palpate pt left.    Capillary refill time is immediate to all  digits,  Proximal to distal cooling it warm to warm.  Digital hair growth is present bilateral   NEUROLOGIC: sensation is intact to 5.07 monofilament at 5/5 sites bilateral.  Light touch is intact bilateral, vibratory sensation intact bilateral, achilles tendon reflex is intact bilateral.   MUSCULOSKELETAL: acceptable muscle strength, tone and stability bilateral.  Forefoot cavus deformity with contracture of lesser digits is noted bilateral.    DERMATOLOGIC: skin is warm, supple, and dry.  No open lesions noted.  No interdigital maceration noted bilateral.  Digital nails are discolored, dystrophic, thick, with subungual debris present x 10.      Assessment   1.  Claudication (HCC)   2. Pain due to onychomycosis of nail      Plan  Exam findings discussed with the patient.  I recommended a noninvasive vascular study to evaluate the lower extremity arterial supply and we will make the referral for this test.  I also trim the nails in thickness and length under manual and mechanical means without complication.  He will be seen back every 3 months for preventive diabetic foot care.  We will call with the result of the vascular study when it becomes available.

## 2021-09-21 ENCOUNTER — Other Ambulatory Visit: Payer: Self-pay

## 2021-09-21 ENCOUNTER — Emergency Department (HOSPITAL_COMMUNITY)
Admission: EM | Admit: 2021-09-21 | Discharge: 2021-09-21 | Disposition: A | Discharge status: Home / Self Care | Payer: Medicare HMO | Attending: Emergency Medicine | Admitting: Emergency Medicine

## 2021-09-21 ENCOUNTER — Emergency Department (HOSPITAL_COMMUNITY): Payer: Medicare HMO

## 2021-09-21 DIAGNOSIS — I10 Essential (primary) hypertension: Secondary | ICD-10-CM | POA: Insufficient documentation

## 2021-09-21 DIAGNOSIS — Z7984 Long term (current) use of oral hypoglycemic drugs: Secondary | ICD-10-CM | POA: Diagnosis not present

## 2021-09-21 DIAGNOSIS — Z7982 Long term (current) use of aspirin: Secondary | ICD-10-CM | POA: Insufficient documentation

## 2021-09-21 DIAGNOSIS — I2510 Atherosclerotic heart disease of native coronary artery without angina pectoris: Secondary | ICD-10-CM | POA: Diagnosis not present

## 2021-09-21 DIAGNOSIS — R404 Transient alteration of awareness: Secondary | ICD-10-CM | POA: Diagnosis not present

## 2021-09-21 DIAGNOSIS — Z87891 Personal history of nicotine dependence: Secondary | ICD-10-CM | POA: Diagnosis not present

## 2021-09-21 DIAGNOSIS — R4020 Unspecified coma: Secondary | ICD-10-CM | POA: Diagnosis not present

## 2021-09-21 DIAGNOSIS — Z950 Presence of cardiac pacemaker: Secondary | ICD-10-CM | POA: Diagnosis not present

## 2021-09-21 DIAGNOSIS — R0689 Other abnormalities of breathing: Secondary | ICD-10-CM | POA: Diagnosis not present

## 2021-09-21 DIAGNOSIS — T402X1A Poisoning by other opioids, accidental (unintentional), initial encounter: Secondary | ICD-10-CM | POA: Diagnosis not present

## 2021-09-21 DIAGNOSIS — R69 Illness, unspecified: Secondary | ICD-10-CM | POA: Diagnosis not present

## 2021-09-21 DIAGNOSIS — E119 Type 2 diabetes mellitus without complications: Secondary | ICD-10-CM | POA: Diagnosis not present

## 2021-09-21 DIAGNOSIS — R0602 Shortness of breath: Secondary | ICD-10-CM

## 2021-09-21 DIAGNOSIS — T40601A Poisoning by unspecified narcotics, accidental (unintentional), initial encounter: Secondary | ICD-10-CM

## 2021-09-21 DIAGNOSIS — Z951 Presence of aortocoronary bypass graft: Secondary | ICD-10-CM | POA: Diagnosis not present

## 2021-09-21 DIAGNOSIS — T50904A Poisoning by unspecified drugs, medicaments and biological substances, undetermined, initial encounter: Secondary | ICD-10-CM | POA: Diagnosis not present

## 2021-09-21 DIAGNOSIS — Z79899 Other long term (current) drug therapy: Secondary | ICD-10-CM | POA: Insufficient documentation

## 2021-09-21 DIAGNOSIS — Z743 Need for continuous supervision: Secondary | ICD-10-CM | POA: Diagnosis not present

## 2021-09-21 LAB — CBC WITH DIFFERENTIAL/PLATELET
Abs Immature Granulocytes: 0.04 10*3/uL (ref 0.00–0.07)
Basophils Absolute: 0.1 10*3/uL (ref 0.0–0.1)
Basophils Relative: 1 %
Eosinophils Absolute: 0.2 10*3/uL (ref 0.0–0.5)
Eosinophils Relative: 2 %
HCT: 35.9 % — ABNORMAL LOW (ref 39.0–52.0)
Hemoglobin: 11.4 g/dL — ABNORMAL LOW (ref 13.0–17.0)
Immature Granulocytes: 1 %
Lymphocytes Relative: 48 %
Lymphs Abs: 3.8 10*3/uL (ref 0.7–4.0)
MCH: 28.8 pg (ref 26.0–34.0)
MCHC: 31.8 g/dL (ref 30.0–36.0)
MCV: 90.7 fL (ref 80.0–100.0)
Monocytes Absolute: 0.9 10*3/uL (ref 0.1–1.0)
Monocytes Relative: 12 %
Neutro Abs: 2.8 10*3/uL (ref 1.7–7.7)
Neutrophils Relative %: 36 %
Platelets: 248 10*3/uL (ref 150–400)
RBC: 3.96 MIL/uL — ABNORMAL LOW (ref 4.22–5.81)
RDW: 12.5 % (ref 11.5–15.5)
WBC: 7.8 10*3/uL (ref 4.0–10.5)
nRBC: 0 % (ref 0.0–0.2)

## 2021-09-21 LAB — BASIC METABOLIC PANEL
Anion gap: 14 (ref 5–15)
BUN: 20 mg/dL (ref 8–23)
CO2: 15 mmol/L — ABNORMAL LOW (ref 22–32)
Calcium: 9.6 mg/dL (ref 8.9–10.3)
Chloride: 108 mmol/L (ref 98–111)
Creatinine, Ser: 1.45 mg/dL — ABNORMAL HIGH (ref 0.61–1.24)
GFR, Estimated: 52 mL/min — ABNORMAL LOW (ref >60)
Glucose, Bld: 190 mg/dL — ABNORMAL HIGH (ref 70–99)
Potassium: 3.1 mmol/L — ABNORMAL LOW (ref 3.5–5.1)
Sodium: 137 mmol/L (ref 135–145)

## 2021-09-21 MED ORDER — NALOXONE HCL 4 MG/0.1ML NA LIQD
1.0000 | Freq: Once | NASAL | Status: AC
  Administered 2021-09-21: 1 via NASAL
  Filled 2021-09-21: qty 4

## 2021-09-21 NOTE — ED Triage Notes (Signed)
Pt here via EMS from a friends house d/t being found unresponsive, agonal respirations, pinpoint pupils. ETOH on board. EMS administered 4mg  narcan with improvement. Pt denies opioids. Only endorsed cocaine. Nasal trumpet in place. Pace maker noted.  Alert and oriented on arrival to ED.  136/88 HR 90 RR 18 CBG 143 98% RA 20g LFA

## 2021-09-21 NOTE — Discharge Instructions (Signed)
There is help if you need it.  Please do not use dirty needles, this could cause you a severe infection to your skin, heart or spinal cord.  This could kill you or leave you permanently disabled.  There was a recent study done at [facility] that showed that the risk of death for someone that had unintentionally overdosed on narcotics was as high as 15% in the next year.  This is much higher than most every other medical condition.  [place] [facility]ioid Problem ([facility]STOP) Fixed; mobile; peer-based Chase Holleman [phone] [email or web address] Fixed site exchange at [employer], Wednesdays (2-5pm) and Thursdays (4-8pm). [address] [place], [place] [address] Call or text to arrange mobile and peer exchange, Mondays (1-4pm) and Fridays (4-7pm). Serving [place] [email or web address]  Suboxone clinic: [facility] [address] [place], [address]  [facility] [address] [place], [address]  [facility] [address] [place] [address]  

## 2021-09-21 NOTE — ED Provider Notes (Signed)
[facility] EMERGENCY DEPARTMENT Provider Note   CSN: [id number] Arrival date & time: 09/21/21  1842     History Chief Complaint  Patient presents with   Drug Overdose    Jonathan Brown is a 70 y.o. male.  HPI This is a 70 year old male with past medical history listed below including hypertension, hyperlipidemia, CAD status post CABG, and ICD who presents after likely overdose.  Patient reports he was drinking and snorting cocaine with a friend when he lost consciousness.  EMS was called as the patient was unresponsive and unarousable.  EMS gave 4 mg of intranasal Narcan with good response.  Nasal trumpet was placed in route.  Patient denies any recent sick symptoms including chest pain, shortness of breath, fever, chills, vomiting, diarrhea.    Past Medical History:  Diagnosis Date   Arthritis    Cataract 02/26/2021   Bilateral   CHB (complete heart block) (HCC) 10/05/2017   Diabetes mellitus without complication (HCC) 2014   type 2    Encounter for care of pacemaker 07/20/2019   Former smoker    GERD (gastroesophageal reflux disease)    Hyperlipidemia    Hypertension 2012   Open-angle glaucoma 02/26/2021   Pacemaker Medtronic Azure XT DR MRI [id number] Dual chamber pacemaker in situ 10/03/2017   PVD (peripheral vascular disease) (HCC)    S/P AVR    Aortic valve replacement using a 21-mm pericardial Magna Ease (09/29/2017 by Dr. 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    wife is able to read to him    Patient Active Problem List   Diagnosis Date Noted   Visit for wound check 09/07/2021   Nail hypertrophy 09/07/2021   Leg laceration, right, sequela 09/01/2021   Fatigue 09/01/2021   Needs flu shot 09/01/2021   Encounter for health maintenance examination in adult 09/09/2020   Hyperlipidemia associated with type 2 diabetes mellitus (HCC)  09/09/2020   Encounter for care of pacemaker 07/20/2019   Medicare annual wellness visit, subsequent 06/18/2019   Advanced directives, counseling/discussion 06/18/2019   PVD (peripheral vascular disease) (HCC) 06/15/2018   CHB (complete heart block) (HCC) 10/05/2017   Pacemaker Medtronic Azure XT DR MRI [id number] Dual chamber pacemaker in situ 10/03/2017   S/P AVR 09/29/2017   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   Pleural plaque 06/14/2017   Screening for prostate cancer 06/14/2017   Former smoker 06/14/2017   Vaccine counseling 03/14/2017   Poor diet 11/04/2016   Diabetic mononeuropathy associated with type 2 diabetes mellitus (HCC) 02/03/2016   Gastroesophageal reflux disease without esophagitis 02/03/2016   Essential hypertension 02/03/2016   Type 2 diabetes mellitus with complication, without long-term current use of insulin (HCC) 02/03/2016   Need for prophylactic vaccination and inoculation against influenza 02/03/2016   Atherosclerosis of native artery of both lower extremities with intermittent claudication (HCC) 02/03/2016    Past Surgical History:  Procedure Laterality Date   AORTIC VALVE REPLACEMENT N/A 09/29/2017   Procedure: AORTIC VALVE REPLACEMENT (AVR) using 68mm Magna Ease valve;  Surgeon: Kerin Perna, MD;  Location: [facility] OR;  Service: Open Heart Surgery;  Laterality: N/A;   CARDIAC CATHETERIZATION  07/12/2017   COLONOSCOPY  2008   LEFT AND RIGHT HEART CATHETERIZATION WITH CORONARY ANGIOGRAM N/A 04/02/2014   Procedure: LEFT AND RIGHT HEART CATHETERIZATION WITH CORONARY ANGIOGRAM;  Surgeon: Pamella Pert, MD;  Location:  [facility] CATH LAB;  Service: Cardiovascular;  Laterality: N/A;   PACEMAKER IMPLANT N/A 10/03/2017   Procedure: PACEMAKER IMPLANT;  Surgeon: Marinus Maw, MD;  Location: [facility] INVASIVE CV LAB;  Service: Cardiovascular;  Laterality: N/A;   TEE WITHOUT CARDIOVERSION N/A 04/30/2014   TEE WITHOUT CARDIOVERSION N/A 09/29/2017   Procedure: TRANSESOPHAGEAL  ECHOCARDIOGRAM (TEE);  Surgeon: Donata Clay, Theron Arista, MD;  Location: [facility] OR;  Service: Open Heart Surgery;  Laterality: N/A;       Family History  Problem Relation Age of Onset   Other Mother        died of old age   Heart disease Father    Sleep apnea Sister    Diabetes Brother        diabetic coma x 3 years   Alcohol abuse Brother    Heart disease Brother 19       MI   Cancer Neg Hx    Stroke Neg Hx     Social History   Tobacco Use   Smoking status: Former    Packs/day: 0.25    Years: 30.00    Pack years: 7.50    Types: Cigarettes    Quit date: 09/28/2007    Years since quitting: 13.9   Smokeless tobacco: Never  Vaping Use   Vaping Use: Never used  Substance Use Topics   Alcohol use: No   Drug use: Yes    Frequency: 7.0 times per week    Types: Marijuana    Comment: 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 Authorizing Provider  acetaminophen (TYLENOL) 500 MG tablet Take 1,000 mg by mouth daily as needed for moderate pain.    [provider]  amLODipine (NORVASC) 10 MG tablet TAKE 1 TABLET BY MOUTH EVERY DAY 07/29/21   Yates Decamp, MD  amoxicillin-clavulanate (AUGMENTIN) 875-125 MG tablet Take 1 tablet by mouth 2 (two) times daily. 09/07/21   Tysinger, Kermit Balo, PA-C  aspirin EC 81 MG tablet Take 1 tablet (81 mg total) by mouth daily. 09/07/21   Tysinger, Kermit Balo, PA-C  cholecalciferol (VITAMIN D3) 25 MCG (1000 UNIT) tablet Take 1 tablet (1,000 Units total) by mouth daily. 09/18/21   Tysinger, Kermit Balo, PA-C  glucose blood test strip 100 each by Other route daily. Use as instructed   Dispense 100 box   12 refills 09/10/20   Tysinger, Kermit Balo, PA-C  hydrocortisone 2.5 % cream Apply topically 2 (two) times daily. 12/11/20   Tysinger, Kermit Balo, PA-C  JARDIANCE 10 MG TABS tablet TAKE 1 TABLET BY MOUTH EVERY DAY BEFORE BREAKFAST 09/07/21   Tysinger, Kermit Balo, PA-C  Lancets  [facility] ULTRASOFT) lancets Use as instructed 09/10/20   Tysinger, Kermit Balo, PA-C  Lancets MISC Use as instructed. Needs lancets based on pt's meter. Needs one touch verio lancets 09/10/20   Tysinger, Kermit Balo, PA-C  latanoprost (XALATAN) 0.005 % ophthalmic solution INSTILL 1 DROP INTO BOTH EYES EVERY DAY AT NIGHT 07/22/20   [provider]  latanoprost (XALATAN) 0.005 % ophthalmic solution Place 1 drop into both eyes nightly. 07/16/21   [provider]  metFORMIN (GLUCOPHAGE) 500 MG tablet TAKE ONE TABLET BY MOUTH TWICE DAILY WITH  MEAL 09/07/21   Tysinger, Kermit Balo, PA-C  metoprolol tartrate (LOPRESSOR) 50 MG tablet Take 1 tablet (50 mg total) by mouth 2 (two) times daily. 09/07/21   Tysinger, Kermit Balo, PA-C  omeprazole (PRILOSEC) 40 MG capsule TAKE 1 CAPSULE BY MOUTH EVERY DAY 12/11/20   Tysinger, Kermit Balo, PA-C  rosuvastatin (CRESTOR) 20 MG tablet TAKE 1 TABLET BY MOUTH EVERYDAY AT BEDTIME 09/07/21   Tysinger, Kermit Balo, PA-C  Tetrahydrozoline HCl (VISINE OP) Apply 1 drop to eye daily as needed (dry eyes).    [provider]  valsartan-hydrochlorothiazide (DIOVAN-HCT) 160-12.5 MG tablet TAKE 1 TABLET BY MOUTH EVERY DAY IN THE MORNING 09/07/21   Tysinger, Kermit Balo, PA-C  vitamin B-12 (CYANOCOBALAMIN) 50 MCG tablet Take 50 mcg by mouth daily.    [provider]  Vitamin D, Cholecalciferol, 10 MCG (400 UNIT) CAPS Take by mouth daily.    [provider]  Vitamin E 100 units TABS Take by mouth daily.    [provider]    Allergies    Patient has no known allergies.  Review of Systems   Review of Systems  Constitutional:  Negative for chills and fever.  HENT:  Negative for ear pain and sore throat.   Eyes:  Negative for pain and visual disturbance.  Respiratory:  Negative for cough and shortness of breath.   Cardiovascular:  Negative for chest pain and palpitations.  Gastrointestinal:  Negative for abdominal pain and vomiting.  Genitourinary:  Negative  for dysuria and hematuria.  Musculoskeletal:  Negative for arthralgias and back pain.  Skin:  Negative for color change and rash.  Neurological:  Positive for syncope. Negative for dizziness, seizures, facial asymmetry and headaches.  All other systems reviewed and are negative.  Physical Exam Updated Vital Signs BP (!) 159/89 (BP Location: Right Arm)   Pulse 82   Temp 98.1 F (36.7 C) (Oral)   Resp 20   Ht 5\' 9"  (1.753 m)   Wt 87.4 kg   SpO2 94%   BMI 28.45 kg/m   Physical Exam Vitals and nursing note reviewed.  Constitutional:      Appearance: He is well-developed.  HENT:     Head: Normocephalic and atraumatic.  Eyes:     Conjunctiva/sclera: Conjunctivae normal.  Cardiovascular:     Rate and Rhythm: Normal rate and regular rhythm.     Heart sounds: No murmur heard. Pulmonary:     Effort: Pulmonary effort is normal. No respiratory distress.     Breath sounds: Normal breath sounds.  Abdominal:     Palpations: Abdomen is soft.     Tenderness: There is no abdominal tenderness.  Musculoskeletal:     Cervical back: Neck supple.  Skin:    General: Skin is warm and dry.  Neurological:     General: No focal deficit present.     Mental Status: He is alert.     Comments: Somnolent but arouses to voice. A&Ox4.     ED Results / Procedures / Treatments   Labs (all labs ordered are listed, but only abnormal results are displayed) Labs Reviewed  CBC WITH DIFFERENTIAL/PLATELET - Abnormal; Notable for the following components:      Result Value   RBC 3.96 (*)    Hemoglobin 11.4 (*)    HCT 35.9 (*)    All other components within normal limits  BASIC METABOLIC PANEL - Abnormal; Notable for the following components:   Potassium 3.1 (*)    CO2 15 (*)    Glucose, Bld 190 (*)    Creatinine, Ser 1.45 (*)    GFR, Estimated 52 (*)    All other components within normal limits    EKG EKG Interpretation  Date/Time:  Monday September 21 2021 19:08:21 EDT Ventricular Rate:   74 PR Interval:  223 QRS Duration: 156 QT Interval:  435 QTC Calculation: 483 R Axis:   -13 Text Interpretation: Atrial-sensed ventricular-paced rhythm No further analysis attempted due to paced rhythm No significant change since last tracing Confirmed by Melene Plan [phone]) on 09/21/2021 8:06:46 PM  Radiology DG CHEST PORT 1 VIEW  Result Date: 09/21/2021 CLINICAL DATA:  Shortness of breath EXAM: PORTABLE CHEST 1 VIEW COMPARISON:  None. FINDINGS: The heart size and mediastinal contours are within normal limits. Both lungs are clear. The visualized skeletal structures are unremarkable. IMPRESSION: No active disease. Electronically Signed   By: Deatra Robinson M.D.   On: 09/21/2021 20:42    Procedures Procedures   Medications Ordered in ED Medications  naloxone (NARCAN) nasal spray 4 mg/0.1 mL (1 spray Nasal Provided for home use 09/21/21 2250)    ED Course  I have reviewed the triage vital signs and the nursing notes.  Pertinent labs & imaging results that were available during my care of the patient were reviewed by me and considered in my medical decision making (see chart for details).    MDM Rules/Calculators/A&P                          Patient is hemodynamically stable on arrival.  Is somnolent but arouses to voice and is alert and oriented x4 with nonfocal neurologic exam.  Oxygenating appropriately on room air while awake, but while asleep desats to 88%, placed on 2 L by nasal cannula. CXR without evidence of aspiration or other cardiopulmonary abnormality.   LOC likely 2/2 opioid overdose. Endorses use of cocaine, likely laced with opioids given good response to narcan. No reported trauma to the head or otherwise. No SOB or chest pain, no hypoxia, or tachycardia; low suspicion for PE. EKG with ventricular paced rhythm, no evidence of arrhythmia or new ischemia. No CP, low suspicion for ACS. No fever or reports of infectious symptoms. No clear infectious sources on exam. Basic labs  unremarkable. Cr at baseline. Hgb stable and normal WBC.  Bicarb is low at 15 but patient is well appearing and tolerating po.   Monitored for 4 hours. Patient ambulates without difficulty. Is asymptomatic and HDS. Stable for discharge. Given strict return precautions. Advised PCP follow up and avoidance of illicit substances. He stated understanding of the plan.    Final Clinical Impression(s) / ED Diagnoses Final diagnoses:  Shortness of breath  Opiate overdose, accidental or unintentional, initial encounter [facility])    Rx / DC Orders ED Discharge Orders          Ordered    Consult to Peer Support       Provider:  (Not yet assigned)   09/21/21 2242             Doran Clay, MD 09/21/21 2326    Melene Plan, DO 09/22/21 2315

## 2021-09-21 NOTE — ED Notes (Signed)
Pt ambulated in the hall and wobbled on the way to the bathroom and required minimum assistance. Dr. Adela Lank aware. Pt is alert and oriented.

## 2021-09-22 ENCOUNTER — Telehealth: Payer: Self-pay

## 2021-09-22 NOTE — Telephone Encounter (Signed)
Pt. Showed up on my Pt. Ping report was recently in the ED for drug overdose. It did not say to f/u here but wasn't sure if you needed to see him or not.

## 2021-09-28 ENCOUNTER — Emergency Department (HOSPITAL_COMMUNITY): Payer: Medicare HMO

## 2021-09-28 ENCOUNTER — Other Ambulatory Visit: Payer: Self-pay

## 2021-09-28 ENCOUNTER — Emergency Department (HOSPITAL_COMMUNITY)
Admission: EM | Admit: 2021-09-28 | Discharge: 2021-09-28 | Disposition: A | Discharge status: Home / Self Care | Payer: Medicare HMO | Attending: Emergency Medicine | Admitting: Emergency Medicine

## 2021-09-28 ENCOUNTER — Encounter (HOSPITAL_COMMUNITY): Payer: Self-pay | Admitting: Emergency Medicine

## 2021-09-28 DIAGNOSIS — Z79899 Other long term (current) drug therapy: Secondary | ICD-10-CM | POA: Diagnosis not present

## 2021-09-28 DIAGNOSIS — Z87891 Personal history of nicotine dependence: Secondary | ICD-10-CM | POA: Insufficient documentation

## 2021-09-28 DIAGNOSIS — Z950 Presence of cardiac pacemaker: Secondary | ICD-10-CM | POA: Diagnosis not present

## 2021-09-28 DIAGNOSIS — Z7982 Long term (current) use of aspirin: Secondary | ICD-10-CM | POA: Diagnosis not present

## 2021-09-28 DIAGNOSIS — R079 Chest pain, unspecified: Secondary | ICD-10-CM | POA: Diagnosis not present

## 2021-09-28 DIAGNOSIS — Z20822 Contact with and (suspected) exposure to covid-19: Secondary | ICD-10-CM | POA: Diagnosis not present

## 2021-09-28 DIAGNOSIS — K449 Diaphragmatic hernia without obstruction or gangrene: Secondary | ICD-10-CM | POA: Diagnosis not present

## 2021-09-28 DIAGNOSIS — Z7984 Long term (current) use of oral hypoglycemic drugs: Secondary | ICD-10-CM | POA: Insufficient documentation

## 2021-09-28 DIAGNOSIS — R066 Hiccough: Secondary | ICD-10-CM

## 2021-09-28 DIAGNOSIS — I10 Essential (primary) hypertension: Secondary | ICD-10-CM | POA: Diagnosis not present

## 2021-09-28 DIAGNOSIS — R0602 Shortness of breath: Secondary | ICD-10-CM | POA: Diagnosis not present

## 2021-09-28 DIAGNOSIS — E119 Type 2 diabetes mellitus without complications: Secondary | ICD-10-CM | POA: Insufficient documentation

## 2021-09-28 LAB — I-STAT CHEM 8, ED
BUN: 19 mg/dL (ref 8–23)
Calcium, Ion: 1.19 mmol/L (ref 1.15–1.40)
Chloride: 103 mmol/L (ref 98–111)
Creatinine, Ser: 1.3 mg/dL — ABNORMAL HIGH (ref 0.61–1.24)
Glucose, Bld: 104 mg/dL — ABNORMAL HIGH (ref 70–99)
HCT: 36 % — ABNORMAL LOW (ref 39.0–52.0)
Hemoglobin: 12.2 g/dL — ABNORMAL LOW (ref 13.0–17.0)
Potassium: 3.4 mmol/L — ABNORMAL LOW (ref 3.5–5.1)
Sodium: 139 mmol/L (ref 135–145)
TCO2: 25 mmol/L (ref 22–32)

## 2021-09-28 LAB — CBC WITH DIFFERENTIAL/PLATELET
Abs Immature Granulocytes: 0.02 10*3/uL (ref 0.00–0.07)
Basophils Absolute: 0.1 10*3/uL (ref 0.0–0.1)
Basophils Relative: 1 %
Eosinophils Absolute: 0.2 10*3/uL (ref 0.0–0.5)
Eosinophils Relative: 3 %
HCT: 35.8 % — ABNORMAL LOW (ref 39.0–52.0)
Hemoglobin: 11.7 g/dL — ABNORMAL LOW (ref 13.0–17.0)
Immature Granulocytes: 0 %
Lymphocytes Relative: 34 %
Lymphs Abs: 2.6 10*3/uL (ref 0.7–4.0)
MCH: 28.3 pg (ref 26.0–34.0)
MCHC: 32.7 g/dL (ref 30.0–36.0)
MCV: 86.5 fL (ref 80.0–100.0)
Monocytes Absolute: 0.9 10*3/uL (ref 0.1–1.0)
Monocytes Relative: 11 %
Neutro Abs: 3.8 10*3/uL (ref 1.7–7.7)
Neutrophils Relative %: 51 %
Platelets: 204 10*3/uL (ref 150–400)
RBC: 4.14 MIL/uL — ABNORMAL LOW (ref 4.22–5.81)
RDW: 12.5 % (ref 11.5–15.5)
WBC: 7.6 10*3/uL (ref 4.0–10.5)
nRBC: 0 % (ref 0.0–0.2)

## 2021-09-28 LAB — COMPREHENSIVE METABOLIC PANEL
ALT: 19 U/L (ref 0–44)
AST: 18 U/L (ref 15–41)
Albumin: 3.8 g/dL (ref 3.5–5.0)
Alkaline Phosphatase: 60 U/L (ref 38–126)
Anion gap: 11 (ref 5–15)
BUN: 17 mg/dL (ref 8–23)
CO2: 22 mmol/L (ref 22–32)
Calcium: 9.8 mg/dL (ref 8.9–10.3)
Chloride: 104 mmol/L (ref 98–111)
Creatinine, Ser: 1.29 mg/dL — ABNORMAL HIGH (ref 0.61–1.24)
GFR, Estimated: >60 mL/min (ref >60)
Glucose, Bld: 104 mg/dL — ABNORMAL HIGH (ref 70–99)
Potassium: 3.3 mmol/L — ABNORMAL LOW (ref 3.5–5.1)
Sodium: 137 mmol/L (ref 135–145)
Total Bilirubin: 0.9 mg/dL (ref 0.3–1.2)
Total Protein: 6.9 g/dL (ref 6.5–8.1)

## 2021-09-28 LAB — TROPONIN I (HIGH SENSITIVITY)
Troponin I (High Sensitivity): 11 ng/L (ref <18)
Troponin I (High Sensitivity): 10 ng/L (ref <18)

## 2021-09-28 LAB — RESP PANEL BY RT-PCR (FLU A&B, COVID) ARPGX2
Influenza A by PCR: NEGATIVE
Influenza B by PCR: NEGATIVE
SARS Coronavirus 2 by RT PCR: NEGATIVE

## 2021-09-28 LAB — BRAIN NATRIURETIC PEPTIDE: B Natriuretic Peptide: 30.5 pg/mL (ref 0.0–100.0)

## 2021-09-28 MED ORDER — FAMOTIDINE 20 MG PO TABS
20.0000 mg | ORAL_TABLET | Freq: Once | ORAL | Status: AC
  Administered 2021-09-28: 20 mg via ORAL
  Filled 2021-09-28: qty 1

## 2021-09-28 MED ORDER — ALUM & MAG HYDROXIDE-SIMETH 200-200-20 MG/5ML PO SUSP
30.0000 mL | Freq: Once | ORAL | Status: AC
  Administered 2021-09-28: 30 mL via ORAL
  Filled 2021-09-28: qty 30

## 2021-09-28 MED ORDER — METOCLOPRAMIDE HCL 10 MG PO TABS
10.0000 mg | ORAL_TABLET | Freq: Once | ORAL | Status: AC
  Administered 2021-09-28: 10 mg via ORAL
  Filled 2021-09-28: qty 1

## 2021-09-28 MED ORDER — CHLORPROMAZINE HCL 25 MG PO TABS: 25.0000 mg | ORAL_TABLET | Freq: Three times a day (TID) | ORAL | 0 refills | Status: DC | PRN

## 2021-09-28 MED ORDER — CHLORPROMAZINE HCL 25 MG PO TABS
25.0000 mg | ORAL_TABLET | Freq: Once | ORAL | Status: AC
  Administered 2021-09-28: 25 mg via ORAL
  Filled 2021-09-28: qty 1

## 2021-09-28 MED ORDER — IOHEXOL 350 MG/ML SOLN
50.0000 mL | Freq: Once | INTRAVENOUS | Status: AC | PRN
  Administered 2021-09-28: 50 mL via INTRAVENOUS

## 2021-09-28 NOTE — ED Triage Notes (Signed)
Pt reports hiccups since Wednesday on and off, constant for past 24hours to the point it has caused him SOB.

## 2021-09-28 NOTE — ED Provider Notes (Addendum)
[facility] EMERGENCY DEPARTMENT Provider Note   CSN: [id number] Arrival date & time: 09/28/21  0126     History Chief Complaint  Patient presents with   Hiccups   Shortness of Breath    Jonathan Brown is a 70 y.o. male.  The history is provided by the patient.  Shortness of Breath Severity:  Severe Onset quality:  Sudden Duration:  3 days Timing:  Constant Progression:  Worsening Chronicity:  New Context: not URI   Relieved by:  Nothing Worsened by:  Nothing Ineffective treatments:  None tried Associated symptoms: no abdominal pain, no chest pain, no claudication, no cough, no fever, no headaches, no hemoptysis, no neck pain, no PND and no rash   Risk factors: no recent alcohol use   Patient with diabetes mellitus who presents with shortness or breath and uncontrolled hiccups for 24 hours.  No fevers no chills.  No trauma. Patient had emesis x 1.  Admits to marijuana use this week and cocaine use last week.      Past Medical History:  Diagnosis Date   Arthritis    Cataract 02/26/2021   Bilateral   CHB (complete heart block) (HCC) 10/05/2017   Diabetes mellitus without complication (HCC) 2014   type 2    Encounter for care of pacemaker 07/20/2019   Former smoker    GERD (gastroesophageal reflux disease)    Hyperlipidemia    Hypertension 2012   Open-angle glaucoma 02/26/2021   Pacemaker Medtronic Azure XT DR MRI [id number] Dual chamber pacemaker in situ 10/03/2017   PVD (peripheral vascular disease) (HCC)    S/P AVR    Aortic valve replacement using a 21-mm pericardial Magna Ease (09/29/2017 by Dr. 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    wife is able to read to him    Patient Active Problem List   Diagnosis Date Noted   Visit for wound check 09/07/2021   Nail hypertrophy 09/07/2021   Leg laceration, right, sequela 09/01/2021    Fatigue 09/01/2021   Needs flu shot 09/01/2021   Encounter for health maintenance examination in adult 09/09/2020   Hyperlipidemia associated with type 2 diabetes mellitus (HCC) 09/09/2020   Encounter for care of pacemaker 07/20/2019   Medicare annual wellness visit, subsequent 06/18/2019   Advanced directives, counseling/discussion 06/18/2019   PVD (peripheral vascular disease) (HCC) 06/15/2018   CHB (complete heart block) (HCC) 10/05/2017   Pacemaker Medtronic Azure XT DR MRI [id number] Dual chamber pacemaker in situ 10/03/2017   S/P AVR 09/29/2017   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   Pleural plaque 06/14/2017   Screening for prostate cancer 06/14/2017   Former smoker 06/14/2017   Vaccine counseling 03/14/2017   Poor diet 11/04/2016   Diabetic mononeuropathy associated with type 2 diabetes mellitus (HCC) 02/03/2016   Gastroesophageal reflux disease without esophagitis 02/03/2016   Essential hypertension 02/03/2016   Type 2 diabetes mellitus with complication, without long-term current use of insulin (HCC) 02/03/2016   Need for prophylactic vaccination and inoculation against influenza 02/03/2016   Atherosclerosis of native artery of both lower extremities with intermittent claudication (HCC) 02/03/2016    Past Surgical History:  Procedure Laterality Date   AORTIC VALVE REPLACEMENT N/A 09/29/2017   Procedure: AORTIC VALVE REPLACEMENT (AVR) using 21mm Magna Ease valve;  Surgeon: Kerin Perna, MD;  Location: [facility] OR;  Service: Open Heart Surgery;  Laterality: N/A;   CARDIAC CATHETERIZATION  07/12/2017   COLONOSCOPY  2008   LEFT AND RIGHT HEART CATHETERIZATION WITH CORONARY ANGIOGRAM N/A 04/02/2014   Procedure: LEFT AND RIGHT HEART CATHETERIZATION WITH CORONARY ANGIOGRAM;  Surgeon: Pamella Pert, MD;  Location: [facility] CATH LAB;  Service: Cardiovascular;  Laterality: N/A;   PACEMAKER IMPLANT N/A 10/03/2017   Procedure: PACEMAKER IMPLANT;  Surgeon: Marinus Maw, MD;  Location: [facility]  INVASIVE CV LAB;  Service: Cardiovascular;  Laterality: N/A;   TEE WITHOUT CARDIOVERSION N/A 04/30/2014   TEE WITHOUT CARDIOVERSION N/A 09/29/2017   Procedure: TRANSESOPHAGEAL ECHOCARDIOGRAM (TEE);  Surgeon: Donata Clay, Theron Arista, MD;  Location: [facility] OR;  Service: Open Heart Surgery;  Laterality: N/A;       Family History  Problem Relation Age of Onset   Other Mother        died of old age   Heart disease Father    Sleep apnea Sister    Diabetes Brother        diabetic coma x 3 years   Alcohol abuse Brother    Heart disease Brother 67       MI   Cancer Neg Hx    Stroke Neg Hx     Social History   Tobacco Use   Smoking status: Former    Packs/day: 0.25    Years: 30.00    Pack years: 7.50    Types: Cigarettes    Quit date: 09/28/2007    Years since quitting: 14.0   Smokeless tobacco: Never  Vaping Use   Vaping Use: Never used  Substance Use Topics   Alcohol use: No   Drug use: Yes    Frequency: 7.0 times per week    Types: Marijuana    Comment: 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 Authorizing Provider  acetaminophen (TYLENOL) 500 MG tablet Take 1,000 mg by mouth in the morning and at bedtime.   Yes [provider]  amLODipine (NORVASC) 10 MG tablet TAKE 1 TABLET BY MOUTH EVERY DAY Patient taking differently: Take 10 mg by mouth daily. 07/29/21  Yes Yates Decamp, MD  aspirin EC 81 MG tablet Take 1 tablet (81 mg total) by mouth daily. 09/07/21  Yes Tysinger, Kermit Balo, PA-C  cholecalciferol (VITAMIN D3) 25 MCG (1000 UNIT) tablet Take 1 tablet (1,000 Units total) by mouth daily. 09/18/21  Yes Tysinger, Kermit Balo, PA-C  glucose blood test strip 100 each by Other route daily. Use as instructed   Dispense 100 box   12 refills 09/10/20  Yes Tysinger, Kermit Balo, PA-C  JARDIANCE 10 MG TABS tablet TAKE 1 TABLET BY MOUTH EVERY DAY BEFORE BREAKFAST Patient taking differently: Take 10 mg by  mouth daily. 09/07/21  Yes Tysinger, Kermit Balo, PA-C  Lancets [facility] ULTRASOFT) lancets Use as instructed 09/10/20  Yes Tysinger, Kermit Balo, PA-C  latanoprost (XALATAN) 0.005 % ophthalmic solution Place 1 drop into both eyes at bedtime. 07/22/20  Yes [provider]  metFORMIN (GLUCOPHAGE) 500 MG tablet TAKE ONE TABLET BY MOUTH TWICE DAILY WITH  MEAL Patient taking differently: Take 500 mg by mouth 2 (two) times daily with a meal. 09/07/21  Yes Tysinger, Kermit Balo, PA-C  metoprolol tartrate (LOPRESSOR) 50 MG tablet Take 1 tablet (50 mg total) by mouth 2 (two) times daily. 09/07/21  Yes Tysinger, Kermit Balo, PA-C  omeprazole (PRILOSEC) 40 MG capsule TAKE 1 CAPSULE BY  MOUTH EVERY DAY Patient taking differently: Take 40 mg by mouth daily. 12/11/20  Yes Tysinger, Kermit Balo, PA-C  rosuvastatin (CRESTOR) 20 MG tablet TAKE 1 TABLET BY MOUTH EVERYDAY AT BEDTIME Patient taking differently: Take 20 mg by mouth at bedtime. 09/07/21  Yes Tysinger, Kermit Balo, PA-C  valsartan-hydrochlorothiazide (DIOVAN-HCT) 160-12.5 MG tablet TAKE 1 TABLET BY MOUTH EVERY DAY IN THE MORNING Patient taking differently: Take 1 tablet by mouth daily. 09/07/21  Yes Tysinger, Kermit Balo, PA-C  vitamin B-12 (CYANOCOBALAMIN) 50 MCG tablet Take 50 mcg by mouth daily.   Yes [provider]  Vitamin E 100 units TABS Take 1 tablet by mouth daily.   Yes [provider]  amoxicillin-clavulanate (AUGMENTIN) 875-125 MG tablet Take 1 tablet by mouth 2 (two) times daily. Patient not taking: Reported on 09/28/2021 09/07/21   Tysinger, Kermit Balo, PA-C  hydrocortisone 2.5 % cream Apply topically 2 (two) times daily. Patient not taking: Reported on 09/28/2021 12/11/20   Tysinger, Kermit Balo, PA-C  Tetrahydrozoline HCl (VISINE OP) Apply 1 drop to eye daily as needed (dry eyes).    [provider]    Allergies    Patient has no known allergies.  Review of Systems   Review of Systems  Constitutional:  Negative for fever.  HENT:   Negative for drooling.   Eyes:  Negative for redness.  Respiratory:  Positive for shortness of breath. Negative for cough and hemoptysis.        Hiccup   Cardiovascular:  Negative for chest pain, claudication and PND.  Gastrointestinal:  Negative for abdominal pain.  Genitourinary:  Negative for difficulty urinating.  Musculoskeletal:  Negative for arthralgias and neck pain.  Skin:  Negative for rash.  Neurological:  Negative for headaches.  Psychiatric/Behavioral:  Negative for agitation.   All other systems reviewed and are negative.  Physical Exam Updated Vital Signs BP 103/74   Pulse 60   Temp 98.1 F (36.7 C) (Oral)   Resp 18   Ht 5\' 9"  (1.753 m)   Wt 81.6 kg   SpO2 99%   BMI 26.58 kg/m   Physical Exam Vitals and nursing note reviewed.  Constitutional:      General: He is not in acute distress.    Appearance: Normal appearance. He is not diaphoretic.     Comments: Uncontrolled hiccups, approximately every 10-12 seconds   HENT:     Head: Normocephalic and atraumatic.     Nose: Nose normal.  Eyes:     Conjunctiva/sclera: Conjunctivae normal.     Pupils: Pupils are equal, round, and reactive to light.  Cardiovascular:     Rate and Rhythm: Normal rate and regular rhythm.     Pulses: Normal pulses.     Heart sounds: Normal heart sounds.  Pulmonary:     Effort: Tachypnea present.     Breath sounds: Decreased air movement present. No rales.  Abdominal:     General: Abdomen is flat. Bowel sounds are normal.     Palpations: Abdomen is soft.     Tenderness: There is no abdominal tenderness. There is no guarding.  Musculoskeletal:        General: Normal range of motion.     Cervical back: Normal range of motion and neck supple.  Skin:    General: Skin is warm and dry.     Capillary Refill: Capillary refill takes less than 2 seconds.  Neurological:     General: No focal deficit present.     Mental Status: He  is alert and oriented to person, place, and time.     Deep  Tendon Reflexes: Reflexes normal.  Psychiatric:        Mood and Affect: Mood normal.        Behavior: Behavior normal.    ED Results / Procedures / Treatments   Labs (all labs ordered are listed, but only abnormal results are displayed) Results for orders placed or performed during the hospital encounter of 09/28/21  CBC with Differential/Platelet  Result Value Ref Range   WBC 7.6 4.0 - 10.5 K/uL   RBC 4.14 (L) 4.22 - 5.81 MIL/uL   Hemoglobin 11.7 (L) 13.0 - 17.0 g/dL   HCT 85.4 (L) 62.7 - 03.5 %   MCV 86.5 80.0 - 100.0 fL   MCH 28.3 26.0 - 34.0 pg   MCHC 32.7 30.0 - 36.0 g/dL   RDW 00.9 38.1 - 82.9 %   Platelets 204 150 - 400 K/uL   nRBC 0.0 0.0 - 0.2 %   Neutrophils Relative % 51 %   Neutro Abs 3.8 1.7 - 7.7 K/uL   Lymphocytes Relative 34 %   Lymphs Abs 2.6 0.7 - 4.0 K/uL   Monocytes Relative 11 %   Monocytes Absolute 0.9 0.1 - 1.0 K/uL   Eosinophils Relative 3 %   Eosinophils Absolute 0.2 0.0 - 0.5 K/uL   Basophils Relative 1 %   Basophils Absolute 0.1 0.0 - 0.1 K/uL   Immature Granulocytes 0 %   Abs Immature Granulocytes 0.02 0.00 - 0.07 K/uL  I-stat chem 8, ED (not at [facility] or [facility])  Result Value Ref Range   Sodium 139 135 - 145 mmol/L   Potassium 3.4 (L) 3.5 - 5.1 mmol/L   Chloride 103 98 - 111 mmol/L   BUN 19 8 - 23 mg/dL   Creatinine, Ser 9.37 (H) 0.61 - 1.24 mg/dL   Glucose, Bld 169 (H) 70 - 99 mg/dL   Calcium, Ion 6.78 9.38 - 1.40 mmol/L   TCO2 25 22 - 32 mmol/L   Hemoglobin 12.2 (L) 13.0 - 17.0 g/dL   HCT 10.1 (L) 75.1 - 02.5 %   DG Chest 1 View  Result Date: 09/28/2021 CLINICAL DATA:  Hiccups since Wednesday EXAM: CHEST  1 VIEW COMPARISON:  09/21/2021 FINDINGS: Normal heart size. Prior median sternotomy and aortic valve replacement. Dual-chamber pacer leads from the left. Widening of the lower mediastinum from a fatty hiatal hernia by 2020 chest CT. A button creates artifact over the right chest. There is no edema, consolidation, effusion, or pneumothorax.  IMPRESSION: Stable chest.  No evidence of active disease. Electronically Signed   By: Tiburcio Pea M.D.   On: 09/28/2021 05:59   DG CHEST PORT 1 VIEW  Result Date: 09/21/2021 CLINICAL DATA:  Shortness of breath EXAM: PORTABLE CHEST 1 VIEW COMPARISON:  None. FINDINGS: The heart size and mediastinal contours are within normal limits. Both lungs are clear. The visualized skeletal structures are unremarkable. IMPRESSION: No active disease. Electronically Signed   By: Deatra Robinson M.D.   On: 09/21/2021 20:42     EKG EKG Interpretation  Date/Time:  Monday September 28 2021 01:52:07 EDT Ventricular Rate:  68 PR Interval:  206 QRS Duration: 164 QT Interval:  448 QTC Calculation: 476 R Axis:   -6 Text Interpretation: Atrial-sensed ventricular-paced rhythm Confirmed by Robi Mitter ([id number]) on 09/28/2021 3:22:46 AM  Radiology DG Chest 1 View  Result Date: 09/28/2021 CLINICAL DATA:  Hiccups since Wednesday EXAM: CHEST  1 VIEW COMPARISON:  09/21/2021 FINDINGS: Normal heart size. Prior median sternotomy and aortic valve replacement. Dual-chamber pacer leads from the left. Widening of the lower mediastinum from a fatty hiatal hernia by 2020 chest CT. A button creates artifact over the right chest. There is no edema, consolidation, effusion, or pneumothorax. IMPRESSION: Stable chest.  No evidence of active disease. Electronically Signed   By: Tiburcio Pea M.D.   On: 09/28/2021 05:59    Procedures Procedures   Medications Ordered in ED Medications  metoCLOPramide (REGLAN) tablet 10 mg (10 mg Oral Given 09/28/21 0147)  famotidine (PEPCID) tablet 20 mg (20 mg Oral Given 09/28/21 0147)  alum & mag hydroxide-simeth (MAALOX/MYLANTA) 200-200-20 MG/5ML suspension 30 mL (30 mLs Oral Given 09/28/21 0148)  chlorproMAZINE (THORAZINE) tablet 25 mg (25 mg Oral Given 09/28/21 0602)    ED Course  I have reviewed the triage vital signs and the nursing notes.  Pertinent labs & imaging results that were  available during my care of the patient were reviewed by me and considered in my medical decision making (see chart for details).   Final Clinical Impression(s) / ED Diagnoses Final diagnoses:  Hiccups  Signed out to Dr. Bernette Mayers pending labs and imaging, will likely need admission      Margree Gimbel, MD 09/28/21 7121

## 2021-09-28 NOTE — ED Provider Notes (Signed)
Emergency Medicine Provider Triage Evaluation Note  Jonathan Brown , a 70 y.o. male  was evaluated in triage.  Pt complains of persistent hiccups.  Onset was on Wednesday, though they were initially intermittent.  Symptoms have now been constant x24 hours.  He has tried water and drinking vinegar without relief.  Was told by somebody that he should try breathing in a bag.  Feels short of breath as a result of his hiccups, but denies chest pain.  No nausea, vomiting, fevers.  Has a pacemaker.  No recent medication changes.  Denies hx of reflux.  Review of Systems  Positive: As above Negative: As above  Physical Exam  There were no vitals taken for this visit. Gen:   Awake, no distress   Resp:  Normal effort  MSK:   Moves extremities without difficulty  Other:  Persistent hiccups  Medical Decision Making  Medically screening exam initiated at 1:41 AM.  Appropriate orders placed.  Enedina Finner was informed that the remainder of the evaluation will be completed by another provider, this initial triage assessment does not replace that evaluation, and the importance of remaining in the ED until their evaluation is complete.  Hiccups    Antony Madura, PA-C 09/28/21 0142    Tilden Fossa, MD 09/28/21 [phone]

## 2021-09-28 NOTE — ED Provider Notes (Signed)
Care of the patient assumed at the change of shift. Here for persistent hiccups, not improved with GI meds, getting Thorazine, cardiac workup and reassess.  Physical Exam  BP 124/76   Pulse 70   Temp 98.1 F (36.7 C) (Oral)   Resp 17   Ht 5\' 9"  (1.753 m)   Wt 81.6 kg   SpO2 99%   BMI 26.58 kg/m   Physical Exam  ED Course/Procedures   Clinical Course as of 09/28/21 0935  Mon Sep 28, 2021  0740 CTA is neg for PE, multiple calcified plaques consistent with asbestos exposure, but unchanged from prior. [CS]  Sep 30, 2021 Patient sleeping soundly, no hiccups when observed for several minutes undisturbed. Awaiting repeat Trop.  [CS]  0931 Second Trop neg. Patient is asymptomatic now, has been able to sleep no further hiccups and would like go to home. He and wife are aware of CT findings including plaques. He will be given Rx for Thorazine as needed for hiccups and recommend close PCP and Cards follow up.  [CS]    Clinical Course User Index [CS] [id number], MD    Procedures  MDM         Pollyann Savoy, MD 09/28/21 [phone]

## 2021-09-28 NOTE — ED Notes (Signed)
Pt was given all D/C instructions with his wife present. IV removed, ambulated to restroom, no c/o any pain at time of departure.

## 2021-09-29 ENCOUNTER — Encounter: Payer: Self-pay | Admitting: Medical

## 2021-09-29 ENCOUNTER — Ambulatory Visit (INDEPENDENT_AMBULATORY_CARE_PROVIDER_SITE_OTHER): Payer: Medicare HMO | Admitting: Medical

## 2021-09-29 VITALS — BP 138/94 | HR 78 | Ht 66.5 in | Wt 185.8 lb

## 2021-09-29 DIAGNOSIS — T50901D Poisoning by unspecified drugs, medicaments and biological substances, accidental (unintentional), subsequent encounter: Secondary | ICD-10-CM

## 2021-09-29 DIAGNOSIS — I700 Atherosclerosis of aorta: Secondary | ICD-10-CM | POA: Diagnosis not present

## 2021-09-29 DIAGNOSIS — F1910 Other psychoactive substance abuse, uncomplicated: Secondary | ICD-10-CM | POA: Diagnosis not present

## 2021-09-29 DIAGNOSIS — D649 Anemia, unspecified: Secondary | ICD-10-CM

## 2021-09-29 DIAGNOSIS — E1141 Type 2 diabetes mellitus with diabetic mononeuropathy: Secondary | ICD-10-CM | POA: Diagnosis not present

## 2021-09-29 DIAGNOSIS — J929 Pleural plaque without asbestos: Secondary | ICD-10-CM | POA: Diagnosis not present

## 2021-09-29 DIAGNOSIS — R69 Illness, unspecified: Secondary | ICD-10-CM | POA: Diagnosis not present

## 2021-09-29 DIAGNOSIS — E639 Nutritional deficiency, unspecified: Secondary | ICD-10-CM | POA: Diagnosis not present

## 2021-09-29 DIAGNOSIS — R942 Abnormal results of pulmonary function studies: Secondary | ICD-10-CM

## 2021-09-29 NOTE — Progress Notes (Signed)
Subjective:  Jonathan Brown is a 70 y.o. male who presents for Chief Complaint  Patient presents with   Hospitalization Follow-up    Opiate overdose on 09/21/21 , Hiccups ER visit on 09/28/21. States he did not mean to harm himself he was just trying to get a high and feels foolish   Hiccups    Went to ER yesterday, has had this uncontrollably for 3-4 days and cannot sleep from this. ER tried pepcid and was not effective, wife gave vinegar po did not help.      Here for emergency department follow-up.  His medical history includes hypertension, hyperlipidemia, coronary artery disease status post CABG, ICD, diabetes, status post aortic valve replacement, who presented to the emergency department by EMS.  On September 26 he had apparently been drinking alcohol and snorting cocaine with Zofran and lost consciousness.  EMS was called.  He was unresponsive and unarousable.  EMS gave 4 mg of intranasal Narcan with good response.  Jonathan Brown notes that he does smoke marijuana fairly regularly but this was an unusual occurrence where his friend brought some cocaine over  During his stay in the emergency department he was somnolent and oxygenation desaturated to 88% when he was asleep.  He was placed on 2 L nasal cannula.  He was monitored for about 4 hours and after evaluation he was discharged from emergency department and advised to f/u with PCP  Today he is accompanied by his wife.   She was unaware of his cocaine use.  He smokes marijuana about once daily and has been doing this for many years..    Today he feels fine, happy,  denies depressed mood.  He says he uses marijuana to calm his nerves, but he denies anxiety or other significant worry or stressor.  He spends some of his time helping his sisters with their yard work.  It is on a regular basis.  Currently he denies any other symptoms.  No trouble breathing, no confusion, no somnolence, no chest pain, no urinary issues, no other  symptoms.  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  He does not smoke cigarettes.  No history of asthma or COPD  After his initial hospital visit he went back to the hospital for onset of hiccups.  He was given medication which is helping.  His wife feels like stress or getting agitated makes his hiccups worse.  No other aggravating or relieving factors.    No other c/o.  Past Medical History:  Diagnosis Date   Arthritis    Cataract 02/26/2021   Bilateral   CHB (complete heart block) (HCC) 10/05/2017   Diabetes mellitus without complication (HCC) 2014   type 2    Encounter for care of pacemaker 07/20/2019   Former smoker    GERD (gastroesophageal reflux disease)    Hyperlipidemia    Hypertension 2012   Open-angle glaucoma 02/26/2021   Pacemaker Medtronic Azure XT DR MRI [id number] Dual chamber pacemaker in situ 10/03/2017   PVD (peripheral vascular disease) (HCC)    S/P AVR    Aortic valve replacement using a 21-mm pericardial Magna Ease (09/29/2017 by Dr. 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    wife is able to read to him   Current  Outpatient Medications on File Prior to Visit  Medication Sig Dispense Refill   amLODipine (NORVASC) 10 MG tablet TAKE 1 TABLET BY MOUTH EVERY DAY (Patient taking differently: Take 10 mg by mouth daily.) 90 tablet 3   aspirin EC 81 MG tablet Take 1 tablet (81 mg total) by mouth daily. 90 tablet 3   chlorproMAZINE (THORAZINE) 25 MG tablet Take 1 tablet (25 mg total) by mouth 3 (three) times daily as needed for hiccoughs. 30 tablet 0   cholecalciferol (VITAMIN D3) 25 MCG (1000 UNIT) tablet Take 1 tablet (1,000 Units total) by mouth daily. 90 tablet 3   hydrocortisone 2.5 % cream Apply topically 2 (two) times daily. 30 g 1    JARDIANCE 10 MG TABS tablet TAKE 1 TABLET BY MOUTH EVERY DAY BEFORE BREAKFAST (Patient taking differently: Take 10 mg by mouth daily.) 90 tablet 0   latanoprost (XALATAN) 0.005 % ophthalmic solution Place 1 drop into both eyes at bedtime.     metFORMIN (GLUCOPHAGE) 500 MG tablet TAKE ONE TABLET BY MOUTH TWICE DAILY WITH  MEAL (Patient taking differently: Take 500 mg by mouth 2 (two) times daily with a meal.) 180 tablet 3   metoprolol tartrate (LOPRESSOR) 50 MG tablet Take 1 tablet (50 mg total) by mouth 2 (two) times daily. 180 tablet 3   omeprazole (PRILOSEC) 40 MG capsule TAKE 1 CAPSULE BY MOUTH EVERY DAY (Patient taking differently: Take 40 mg by mouth daily.) 90 capsule 3   rosuvastatin (CRESTOR) 20 MG tablet TAKE 1 TABLET BY MOUTH EVERYDAY AT BEDTIME (Patient taking differently: Take 20 mg by mouth at bedtime.) 90 tablet 3   Tetrahydrozoline HCl (VISINE OP) Apply 1 drop to eye daily as needed (dry eyes).     valsartan-hydrochlorothiazide (DIOVAN-HCT) 160-12.5 MG tablet TAKE 1 TABLET BY MOUTH EVERY DAY IN THE MORNING (Patient taking differently: Take 1 tablet by mouth daily.) 90 tablet 3   vitamin B-12 (CYANOCOBALAMIN) 50 MCG tablet Take 50 mcg by mouth daily.     Vitamin E 100 units TABS Take 1 tablet by mouth daily.     acetaminophen (TYLENOL) 500 MG tablet Take 1,000 mg by mouth in the morning and at bedtime.     glucose blood test strip 100 each by Other route daily. Use as instructed   Dispense 100 box   12 refills 100 each 12   Lancets (ONETOUCH ULTRASOFT) lancets Use as instructed 100 each 12   No current facility-administered medications on file prior to visit.     09/28/2021 IMPRESSION: 1. Negative for pulmonary embolism or other acute finding. 2. 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   Aortic Atherosclerosis (ICD10-I70.0).   The following portions of the patient's history were reviewed and updated as appropriate: allergies, current  medications, past family history, past medical history, past social history, past surgical history and problem list.  ROS Otherwise as in subjective above    Objective: BP (!) 138/94 (BP Location: Right Arm, Patient Position: Sitting)   Pulse 78   Ht 5' 6.5" (1.689 m)   Wt 185 lb 12.8 oz (84.3 kg)   SpO2 98%   BMI 29.54 kg/m   General appearance: alert, no distress, well developed, well nourished Neck: supple, no lymphadenopathy, no thyromegaly, no masses Heart: RRR, normal S1, S2, no murmurs Lungs: CTA bilaterally, no wheezes, rhonchi, or rales Pulses: 2+ radial pulses, 2+ pedal pulses, normal cap refill Ext: no edema Psych: Pleasant, answers questions appropriately    Assessment: Encounter Diagnoses  Name Primary?   Accidental overdose, subsequent encounter Yes   Anemia, unspecified type    Substance abuse (HCC)    Abnormal lung scan    Pleural plaque    Abnormal PFT    Aortic atherosclerosis (HCC)      Plan: I reviewed his emergency department notes, medicines reconciled, discussed the findings and test that were done  He states that he is committed to not using any more cocaine.  He does plan to still smoke marijuana daily.  He declines counseling or mental health referral  He states that this was a wake-up call since he almost died.  He had an abnormal lung scan CT scan that was negative for PE but did show calcified pleural plaques suggestive of asbestos related pleural disease and aortic atherosclerosis  PFT done today.  He has no breathing symptoms currently.  We will continue to monitor with possible yearly CT lung screen.  We will likely recheck PFT yearly as well  Continue statin and current medications.  Anemia on recent lab-we will check folate and B12 level.  He denies any bleeding or bruising  Mearle was seen today for hospitalization follow-up and hiccups.  Diagnoses and all orders for this visit:  Accidental overdose, subsequent  encounter  Anemia, unspecified type -     Folate -     Vitamin B12 -     Spirometry with graph  Substance abuse (HCC)  Abnormal lung scan  Pleural plaque  Abnormal PFT  Aortic atherosclerosis (HCC)   Follow up: 41mo

## 2021-09-30 DIAGNOSIS — R942 Abnormal results of pulmonary function studies: Secondary | ICD-10-CM | POA: Insufficient documentation

## 2021-09-30 DIAGNOSIS — T50901A Poisoning by unspecified drugs, medicaments and biological substances, accidental (unintentional), initial encounter: Secondary | ICD-10-CM | POA: Insufficient documentation

## 2021-09-30 DIAGNOSIS — D649 Anemia, unspecified: Secondary | ICD-10-CM | POA: Insufficient documentation

## 2021-09-30 DIAGNOSIS — F1910 Other psychoactive substance abuse, uncomplicated: Secondary | ICD-10-CM | POA: Insufficient documentation

## 2021-09-30 DIAGNOSIS — I700 Atherosclerosis of aorta: Secondary | ICD-10-CM | POA: Insufficient documentation

## 2021-09-30 LAB — VITAMIN B12: Vitamin B-12: 299 pg/mL (ref 232–1245)

## 2021-09-30 LAB — FOLATE: Folate: 18 ng/mL (ref >3.0)

## 2021-10-14 ENCOUNTER — Telehealth: Payer: Self-pay | Admitting: Medical

## 2021-10-14 NOTE — Chronic Care Management (AMB) (Signed)
  Chronic Care Management   Note  10/14/2021 Name: Jonathan Brown MRN: [id number] DOB: Aug 27, 1951  [name] is a 70 y.o. year old male who is a primary care patient of Genia Del. I reached out to Enedina Finner by phone today in response to a referral sent by [name]'s PCP, Tysinger, Kermit Balo, PA-C.   [name] was given information about Chronic Care Management services today including:  CCM service includes personalized support from designated clinical staff supervised by his physician, including individualized plan of care and coordination with other care providers 24/7 contact phone numbers for assistance for urgent and routine care needs. Service will only be billed when office clinical staff spend 20 minutes or more in a month to coordinate care. Only one practitioner may furnish and bill the service in a calendar month. The patient may stop CCM services at any time (effective at the end of the month) by phone call to the office staff.   Patient agreed to services and verbal consent obtained.   Follow up plan:   [employer] manager, fast food

## 2021-10-21 DIAGNOSIS — H401112 Primary open-angle glaucoma, right eye, moderate stage: Secondary | ICD-10-CM | POA: Diagnosis not present

## 2021-10-22 ENCOUNTER — Ambulatory Visit: Payer: Medicare HMO | Admitting: Cardiology

## 2021-10-26 ENCOUNTER — Encounter: Payer: Self-pay | Admitting: Cardiology

## 2021-10-26 ENCOUNTER — Ambulatory Visit: Payer: Medicare HMO | Admitting: Cardiology

## 2021-10-26 ENCOUNTER — Other Ambulatory Visit: Payer: Self-pay

## 2021-10-26 VITALS — BP 115/69 | HR 76 | Temp 97.4°F | Resp 17 | Ht 66.5 in | Wt 187.0 lb

## 2021-10-26 DIAGNOSIS — I10 Essential (primary) hypertension: Secondary | ICD-10-CM

## 2021-10-26 DIAGNOSIS — I70213 Atherosclerosis of native arteries of extremities with intermittent claudication, bilateral legs: Secondary | ICD-10-CM | POA: Diagnosis not present

## 2021-10-26 DIAGNOSIS — Z950 Presence of cardiac pacemaker: Secondary | ICD-10-CM

## 2021-10-26 DIAGNOSIS — E782 Mixed hyperlipidemia: Secondary | ICD-10-CM | POA: Diagnosis not present

## 2021-10-26 DIAGNOSIS — Z952 Presence of prosthetic heart valve: Secondary | ICD-10-CM

## 2021-10-26 NOTE — Progress Notes (Addendum)
Primary Physician/Referring:  Jac Canavan, PA-C  Patient ID: Jonathan Brown, male    DOB: Aug 15, 1951, 70 y.o.   MRN: [id number]  Chief Complaint  Patient presents with   Follow-up   PAD   Hypertension    6 MONTH   HPI:    Jonathan Brown  is a 70 y.o. AAM with aortic valve replacement using 21 mm pericardial Magna Ease valve for severe aortic stenosis 09/29/2017, Medtronic dual-chamber pacemaker for complete heart block status post aortic valve replacement 10/03/2017. He has hypertension, Mixed hyperlipidemia, PAD.   He was seen in the emergency room on 09/28/2021 when he presented with hiccups and also shortness of breath, he had used marijuana and also cocaine for recreation.  Troponin was negative, BNP was normal, CT angiogram of the chest was negative for PE but did show asbestos lung disease that is stable and chronic.  This is a six-month office visit, states that he is doing well and has been exercising regularly, no chest pain or shortness of breath.  He has not had any further claudication symptoms since last office visit.    Past Medical History:  Diagnosis Date   Arthritis    Cataract 02/26/2021   Bilateral   CHB (complete heart block) (HCC) 10/05/2017   Diabetes mellitus without complication (HCC) 2014   type 2    Encounter for care of pacemaker 07/20/2019   Former smoker    GERD (gastroesophageal reflux disease)    Hyperlipidemia    Hypertension 2012   Open-angle glaucoma 02/26/2021   Pacemaker Medtronic Azure XT DR MRI [id number] Dual chamber pacemaker in situ 10/03/2017   PVD (peripheral vascular disease) (HCC)    S/P AVR    Aortic valve replacement using a 21-mm pericardial Magna Ease (09/29/2017 by Dr. 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    wife is able to read to him   Past Surgical History:  Procedure Laterality Date   AORTIC VALVE  REPLACEMENT N/A 09/29/2017   Procedure: AORTIC VALVE REPLACEMENT (AVR) using 53mm Magna Ease valve;  Surgeon: Kerin Perna, MD;  Location: [facility] OR;  Service: Open Heart Surgery;  Laterality: N/A;   CARDIAC CATHETERIZATION  07/12/2017   COLONOSCOPY  2008   LEFT AND RIGHT HEART CATHETERIZATION WITH CORONARY ANGIOGRAM N/A 04/02/2014   Procedure: LEFT AND RIGHT HEART CATHETERIZATION WITH CORONARY ANGIOGRAM;  Surgeon: Pamella Pert, MD;  Location: [facility] CATH LAB;  Service: Cardiovascular;  Laterality: N/A;   PACEMAKER IMPLANT N/A 10/03/2017   Procedure: PACEMAKER IMPLANT;  Surgeon: Marinus Maw, MD;  Location: [facility] INVASIVE CV LAB;  Service: Cardiovascular;  Laterality: N/A;   TEE WITHOUT CARDIOVERSION N/A 04/30/2014   TEE WITHOUT CARDIOVERSION N/A 09/29/2017   Procedure: TRANSESOPHAGEAL ECHOCARDIOGRAM (TEE);  Surgeon: Donata Clay, Theron Arista, MD;  Location: [facility] OR;  Service: Open Heart Surgery;  Laterality: N/A;   Social History   Tobacco Use   Smoking status: Former    Packs/day: 0.25    Years: 30.00    Pack years: 7.50    Types: Cigarettes    Quit date: 09/28/2007    Years since quitting: 14.0   Smokeless tobacco: Never  Substance Use Topics   Alcohol use: No   Marital Status: Married   ROS  Review of Systems  Cardiovascular:  Negative for chest pain, dyspnea on exertion and leg swelling.  Gastrointestinal:  Negative for melena.  Objective  Blood pressure 115/69, pulse 76, temperature (!) 97.4 F (36.3 C), temperature source Temporal, resp. rate 17, height 5' 6.5" (1.689 m), weight 187 lb (84.8 kg), SpO2 98 %.  Vitals with BMI 10/26/2021 09/29/2021 09/28/2021  Height 5' 6.5" 5' 6.5" -  Weight 187 lbs 185 lbs 13 oz -  BMI 0000000 [id number] -  Systolic [id number] 0000000 [id number]  Diastolic 69 94 76  Pulse 76 78 70     Physical Exam Constitutional:      General: He is not in acute distress.    Appearance: He is well-developed.  Neck:     Thyroid: No thyromegaly.     Vascular: No JVD.  Cardiovascular:      Rate and Rhythm: Normal rate and regular rhythm.     Pulses:          Carotid pulses are 2+ on the right side with bruit and 2+ on the left side with bruit.      Femoral pulses are 2+ on the right side and 2+ on the left side.      Dorsalis pedis pulses are 0 on the right side and 0 on the left side.       Posterior tibial pulses are 2+ on the right side and 2+ on the left side.     Heart sounds: Murmur heard.  Harsh midsystolic murmur is present with a grade of 2/6 radiating to the neck.    No gallop.     Comments: Skin is shiny and hair loss noted.   Pulmonary:     Effort: Pulmonary effort is normal.     Breath sounds: Normal breath sounds.  Abdominal:     General: Bowel sounds are normal.     Palpations: Abdomen is soft.  Musculoskeletal:     Right lower leg: No edema.     Left lower leg: No edema.   Laboratory examination:   Recent Labs    12/11/20 1008 09/01/21 1153 09/21/21 1854 09/28/21 0549 09/28/21 0604  NA 140 138 137 137 139  K 4.1 3.5 3.1* 3.3* 3.4*  CL 105 102 108 104 103  CO2 19* 18* 15* 22  --   GLUCOSE 210* 162* 190* 104* 104*  BUN 17 19 20 17 19   CREATININE 1.32* 1.23 1.45* 1.29* 1.30*  CALCIUM 10.0 10.0 9.6 9.8  --   GFRNONAA 55*  --  52* >60  --   GFRAA 63  --   --   --   --    CrCl cannot be calculated (Patient's most recent lab result is older than the maximum 21 days allowed.).  CMP Latest Ref Rng & Units 09/28/2021 09/28/2021 09/21/2021  Glucose 70 - 99 mg/dL 104(H) 104(H) 190(H)  BUN 8 - 23 mg/dL 19 17 20   Creatinine 0.61 - 1.24 mg/dL 1.30(H) 1.29(H) 1.45(H)  Sodium 135 - 145 mmol/L 139 137 137  Potassium 3.5 - 5.1 mmol/L 3.4(L) 3.3(L) 3.1(L)  Chloride 98 - 111 mmol/L 103 104 108  CO2 22 - 32 mmol/L - 22 15(L)  Calcium 8.9 - 10.3 mg/dL - 9.8 9.6  Total Protein 6.5 - 8.1 g/dL - 6.9 -  Total Bilirubin 0.3 - 1.2 mg/dL - 0.9 -  Alkaline Phos 38 - 126 U/L - 60 -  AST 15 - 41 U/L - 18 -  ALT 0 - 44 U/L - 19 -   CBC Latest Ref Rng & Units  09/28/2021 09/28/2021 09/21/2021  WBC 4.0 -  10.5 K/uL - 7.6 7.8  Hemoglobin 13.0 - 17.0 g/dL 12.2(L) 11.7(L) 11.4(L)  Hematocrit 39.0 - 52.0 % 36.0(L) 35.8(L) 35.9(L)  Platelets 150 - 400 K/uL - 204 248   Lipid Panel     Component Value Date/Time   CHOL 113 10/29/2021 0830   TRIG 247 (H) 10/29/2021 0830   HDL 29 (L) 10/29/2021 0830   CHOLHDL 4.0 09/09/2020 1314   CHOLHDL 4.3 06/14/2017 0835   VLDL 16 06/14/2017 0835   LDLCALC 45 10/29/2021 0830   HEMOGLOBIN A1C Lab Results  Component Value Date   HGBA1C 6.4 (H) 09/01/2021   MPG 99.67 09/27/2017   TSH Recent Labs    09/01/21 1153  TSH 2.350    Medications and allergies  No Known Allergies   Current Outpatient Medications on File Prior to Visit  Medication Sig Dispense Refill   acetaminophen (TYLENOL) 500 MG tablet Take 1,000 mg by mouth in the morning and at bedtime.     amLODipine (NORVASC) 10 MG tablet TAKE 1 TABLET BY MOUTH EVERY DAY (Patient taking differently: Take 10 mg by mouth daily.) 90 tablet 3   aspirin EC 81 MG tablet Take 1 tablet (81 mg total) by mouth daily. 90 tablet 3   chlorproMAZINE (THORAZINE) 25 MG tablet Take 1 tablet (25 mg total) by mouth 3 (three) times daily as needed for hiccoughs. 30 tablet 0   cholecalciferol (VITAMIN D3) 25 MCG (1000 UNIT) tablet Take 1 tablet (1,000 Units total) by mouth daily. 90 tablet 3   glucose blood test strip 100 each by Other route daily. Use as instructed   Dispense 100 box   12 refills 100 each 12   hydrocortisone 2.5 % cream Apply topically 2 (two) times daily. 30 g 1   JARDIANCE 10 MG TABS tablet TAKE 1 TABLET BY MOUTH EVERY DAY BEFORE BREAKFAST (Patient taking differently: Take 10 mg by mouth daily.) 90 tablet 0   Lancets (ONETOUCH ULTRASOFT) lancets Use as instructed 100 each 12   latanoprost (XALATAN) 0.005 % ophthalmic solution Place 1 drop into both eyes at bedtime.     metFORMIN (GLUCOPHAGE) 500 MG tablet TAKE ONE TABLET BY MOUTH TWICE DAILY WITH  MEAL (Patient  taking differently: Take 500 mg by mouth 2 (two) times daily with a meal.) 180 tablet 3   metoprolol tartrate (LOPRESSOR) 50 MG tablet Take 1 tablet (50 mg total) by mouth 2 (two) times daily. 180 tablet 3   omeprazole (PRILOSEC) 40 MG capsule TAKE 1 CAPSULE BY MOUTH EVERY DAY (Patient taking differently: Take 40 mg by mouth daily.) 90 capsule 3   rosuvastatin (CRESTOR) 20 MG tablet TAKE 1 TABLET BY MOUTH EVERYDAY AT BEDTIME (Patient taking differently: Take 20 mg by mouth at bedtime.) 90 tablet 3   Tetrahydrozoline HCl (VISINE OP) Apply 1 drop to eye daily as needed (dry eyes).     valsartan-hydrochlorothiazide (DIOVAN-HCT) 160-12.5 MG tablet TAKE 1 TABLET BY MOUTH EVERY DAY IN THE MORNING (Patient taking differently: Take 1 tablet by mouth daily.) 90 tablet 3   vitamin B-12 (CYANOCOBALAMIN) 50 MCG tablet Take 50 mcg by mouth daily.     Vitamin E 100 units TABS Take 1 tablet by mouth daily.     No current facility-administered medications on file prior to visit.    Radiology:  No results found.  Cardiac Studies:   Lower extremity arterial duplex 07/06/2017 No hemodynamically significant stenoses are identified in the right lower extremity arterial system. Significant velocity increase at the left distal superficial  femoral artery suggestive of >50% stenosis. This exam reveals moderately decreased perfusion of the lower extremity, RABI 0.76 and LABI 0.76. 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 Compared to the study done on 03/06/2014, bilateral ABI has decreased from normal. No change in the left mid SFA stenosis.  Right and left heart catheterization 07/12/2017:  Normal to mild Mild elevation in pulmonary artery pressure, normal LVEDP, PA pressure 35/12, mean 23 mmHg. Mild noncritical coronary artery disease, ostial ramus 30% calcific stenosis, otherwise 10-20% luminal irregularity in all 3 vessels. Mild disease in the RCA. Aortic valve could not be crossed. Heavily calcified, mild aortic root dilatation  and AV appears to be bicuspid.  Carotid artery duplex  06/02/2020:  No hemodynamically significant arterial disease in the internal carotid  artery bilaterally.  Antegrade right vertebral artery flow. Antegrade left vertebral artery  flow.  Echocardiogram 06/02/2020:  Left ventricle cavity is normal in size. Moderate concentric hypertrophy of the left ventricle. Abnormal septal wall motion due to post-operative valve. Normal LV systolic function with visual EF 50-55%. Doppler evidence of grade I (impaired) diastolic dysfunction, normal LAP.  Left atrial cavity is moderately dilated.  S/p 21-mm pericardial Magna Ease valve  placement (2018). Normal functioning valve without any paravalular leak, dehiscence. Mean PG 15 mmHg is acceptable. likely normal for post-op status.  Mild tricuspid regurgitation. No evidence of pulmonary hypertension.  Compared to previous study on 07/06/2017, aortic valve replacement is new.   Device check:  Medtronic Azure XT DR MRI [id number] Dual chamber pacemaker in situ   Remote pacemaker transmission 07/14/2021: Longevity 6 years and 6 months.  AP 16%, VP 100%.  There were no high ventricular rate episodes, no mode switches, normal pacemaker function.  EKG:  EKG 10/26/2021: Normal sinus rhythm at rate of 74 bpm, ventricularly paced rhythm.  No further analysis.  His bundle pacing suspected.  EKG 04/22/2021: Atrially paced rhythm at rate of 60 bpm, probably ventricularly paced rhythm as well.   No significant change from EKG 06/16/2020, 12/17/2019.   Assessment     ICD-10-CM   1. Essential hypertension  I10 EKG 12-Lead    2. S/P AVR with 21 mm Magna Ease Pericardial Tissue Valve,  09/18/2017: Jeralene Huff, MD  Z95.2     3. Pacemaker Medtronic Azure XT DR MRI [id number] Dual chamber pacemaker in situ  Z95.0     4. Atherosclerosis of native artery of both lower extremities with intermittent claudication ([place])  I70.213     5. Mixed hyperlipidemia  E78.2 Lipid Panel  With LDL/HDL Ratio    fenofibrate (TRICOR) 48 MG tablet      Recommendations:   Meds ordered this encounter  Medications   fenofibrate (TRICOR) 48 MG tablet    Sig: Take 1 tablet (48 mg total) by mouth daily.    Dispense:  90 tablet    Refill:  3     Jonathan Brown  is a 70 y.o. male  status post aortic valve replacement using 21 mm pericardial Magna Ease valve for severe aortic stenosis 09/29/2017, Medtronic dual-chamber pacemaker for complete heart block status post aortic valve replacement 10/03/2017. He has hypertension, Mixed hyperlipidemia, PAD.   Patient presented with dyspnea and hiccups after he had used cocaine and marijuana on a recreational basis on 09/28/2021.  I discussed complete abstinence from any of these recreational chemicals, patient appears to not want to try this again.  With regard to hypertension, blood pressure is well controlled.  He needs lipid profile testing for hyperlipidemia.  With  regard to peripheral artery disease since last office visit, states that he has been walking regularly and he has not had any further leg cramps.  No change in his physical exam.  His pacemaker is functioning normally.  He will need in person pacemaker check at some point.  He will continue to transmit remotely.  His pacemaker has been disconnected, Medtronic support number given to the patient today.  I will see him back in 6 months.  Triglycerides remain elevated similar to previous years.  I will add Tricor 48 mg daily.   Adrian Prows, MD, [facility] 10/30/2021, 3:58 PM Office: [phone] Pager: [phone]

## 2021-10-29 DIAGNOSIS — E782 Mixed hyperlipidemia: Secondary | ICD-10-CM | POA: Diagnosis not present

## 2021-10-30 LAB — LIPID PANEL WITH LDL/HDL RATIO
Cholesterol, Total: 113 mg/dL (ref 100–199)
HDL: 29 mg/dL — ABNORMAL LOW (ref >39)
LDL Chol Calc (NIH): 45 mg/dL (ref 0–99)
LDL/HDL Ratio: 1.6 ratio (ref 0.0–3.6)
Triglycerides: 247 mg/dL — ABNORMAL HIGH (ref 0–149)
VLDL Cholesterol Cal: 39 mg/dL (ref 5–40)

## 2021-10-30 MED ORDER — FENOFIBRATE 48 MG PO TABS: 48.0000 mg | ORAL_TABLET | Freq: Every day | ORAL | 3 refills | Status: DC

## 2021-10-30 NOTE — Addendum Note (Signed)
Addended by: Delrae Rend on: 10/30/2021 03:58 PM   Modules accepted: Orders

## 2021-10-30 NOTE — Progress Notes (Signed)
Triglycerides remain elevated similar to previous years.  I will add Tricor 48 mg daily. Left message for the patient.

## 2021-11-13 ENCOUNTER — Ambulatory Visit (INDEPENDENT_AMBULATORY_CARE_PROVIDER_SITE_OTHER): Payer: Medicare HMO

## 2021-11-13 ENCOUNTER — Other Ambulatory Visit: Payer: Self-pay

## 2021-11-13 VITALS — BP 128/76 | HR 66 | Temp 97.5°F | Ht 68.0 in | Wt 192.8 lb

## 2021-11-13 DIAGNOSIS — Z23 Encounter for immunization: Secondary | ICD-10-CM | POA: Diagnosis not present

## 2021-11-13 DIAGNOSIS — Z0000 Encounter for general adult medical examination without abnormal findings: Secondary | ICD-10-CM

## 2021-11-13 MED ORDER — ZOSTER VAC RECOMB ADJUVANTED 50 MCG/0.5ML IM SUSR: 0.5000 mL | Freq: Once | INTRAMUSCULAR | 0 refills | Status: AC

## 2021-11-13 MED ORDER — BOOSTRIX 5-2.5-18.5 LF-MCG/0.5 IM SUSY: 0.5000 mL | PREFILLED_SYRINGE | Freq: Once | INTRAMUSCULAR | 0 refills | Status: AC

## 2021-11-13 NOTE — Patient Instructions (Signed)
Jonathan Brown , Thank you for taking time to come for your Medicare Wellness Visit. I appreciate your ongoing commitment to your health goals. Please review the following plan we discussed and let me know if I can assist you in the future.   Screening recommendations/referrals: Colonoscopy: cologuard 07/11/2019 Recommended yearly ophthalmology/optometry visit for glaucoma screening and checkup Recommended yearly dental visit for hygiene and checkup  Vaccinations: Influenza vaccine: completed 09/01/2021 Pneumococcal vaccine: today Tdap vaccine: due Shingles vaccine: due   Covid-19:  11/28/2020, 03/11/2020, 02/19/2020  Advanced directives: Advance directive discussed with you today. Even though you declined this today please call our office should you change your mind and we can give you the proper paperwork for you to fill out.  Conditions/risks identified: none  Next appointment: Follow up in one year for your annual wellness visit.   Preventive Care 4 Years and Older, Male Preventive care refers to lifestyle choices and visits with your health care provider that can promote health and wellness. What does preventive care include? A yearly physical exam. This is also called an annual well check. Dental exams once or twice a year. Routine eye exams. Ask your health care provider how often you should have your eyes checked. Personal lifestyle choices, including: Daily care of your teeth and gums. Regular physical activity. Eating a healthy diet. Avoiding tobacco and drug use. Limiting alcohol use. Practicing safe sex. Taking low doses of aspirin every day. Taking vitamin and mineral supplements as recommended by your health care provider. What happens during an annual well check? The services and screenings done by your health care provider during your annual well check will depend on your age, overall health, lifestyle risk factors, and family history of disease. Counseling  Your health  care provider may ask you questions about your: Alcohol use. Tobacco use. Drug use. Emotional well-being. Home and relationship well-being. Sexual activity. Eating habits. History of falls. Memory and ability to understand (cognition). Work and work Astronomer. Screening  You may have the following tests or measurements: Height, weight, and BMI. Blood pressure. Lipid and cholesterol levels. These may be checked every 5 years, or more frequently if you are over 42 years old. Skin check. Lung cancer screening. You may have this screening every year starting at age 88 if you have a 30-pack-year history of smoking and currently smoke or have quit within the past 15 years. Fecal occult blood test (FOBT) of the stool. You may have this test every year starting at age 61. Flexible sigmoidoscopy or colonoscopy. You may have a sigmoidoscopy every 5 years or a colonoscopy every 10 years starting at age 47. Prostate cancer screening. Recommendations will vary depending on your family history and other risks. Hepatitis C blood test. Hepatitis B blood test. Sexually transmitted disease (STD) testing. Diabetes screening. This is done by checking your blood sugar (glucose) after you have not eaten for a while (fasting). You may have this done every 1-3 years. Abdominal aortic aneurysm (AAA) screening. You may need this if you are a current or former smoker. Osteoporosis. You may be screened starting at age 60 if you are at high risk. Talk with your health care provider about your test results, treatment options, and if necessary, the need for more tests. Vaccines  Your health care provider may recommend certain vaccines, such as: Influenza vaccine. This is recommended every year. Tetanus, diphtheria, and acellular pertussis (Tdap, Td) vaccine. You may need a Td booster every 10 years. Zoster vaccine. You may need this  after age 22. Pneumococcal 13-valent conjugate (PCV13) vaccine. One dose is  recommended after age 49. Pneumococcal polysaccharide (PPSV23) vaccine. One dose is recommended after age 39. Talk to your health care provider about which screenings and vaccines you need and how often you need them. This information is not intended to replace advice given to you by your health care provider. Make sure you discuss any questions you have with your health care provider. Document Released: 01/09/2016 Document Revised: 09/01/2016 Document Reviewed: 10/14/2015 Elsevier Interactive Patient Education  2017 [place] Prevention in the Home Falls can cause injuries. They can happen to people of all ages. There are many things you can do to make your home safe and to help prevent falls. What can I do on the outside of my home? Regularly fix the edges of walkways and driveways and fix any cracks. Remove anything that might make you trip as you walk through a door, such as a raised step or threshold. Trim any bushes or trees on the path to your home. Use bright outdoor lighting. Clear any walking paths of anything that might make someone trip, such as rocks or tools. Regularly check to see if handrails are loose or broken. Make sure that both sides of any steps have handrails. Any raised decks and porches should have guardrails on the edges. Have any leaves, snow, or ice cleared regularly. Use sand or salt on walking paths during winter. Clean up any spills in your garage right away. This includes oil or grease spills. What can I do in the bathroom? Use night lights. Install grab bars by the toilet and in the tub and shower. Do not use towel bars as grab bars. Use non-skid mats or decals in the tub or shower. If you need to sit down in the shower, use a plastic, non-slip stool. Keep the floor dry. Clean up any water that spills on the floor as soon as it happens. Remove soap buildup in the tub or shower regularly. Attach bath mats securely with double-sided non-slip rug  tape. Do not have throw rugs and other things on the floor that can make you trip. What can I do in the bedroom? Use night lights. Make sure that you have a light by your bed that is easy to reach. Do not use any sheets or blankets that are too big for your bed. They should not hang down onto the floor. Have a firm chair that has side arms. You can use this for support while you get dressed. Do not have throw rugs and other things on the floor that can make you trip. What can I do in the kitchen? Clean up any spills right away. Avoid walking on wet floors. Keep items that you use a lot in easy-to-reach places. If you need to reach something above you, use a strong step stool that has a grab bar. Keep electrical cords out of the way. Do not use floor polish or wax that makes floors slippery. If you must use wax, use non-skid floor wax. Do not have throw rugs and other things on the floor that can make you trip. What can I do with my stairs? Do not leave any items on the stairs. Make sure that there are handrails on both sides of the stairs and use them. Fix handrails that are broken or loose. Make sure that handrails are as long as the stairways. Check any carpeting to make sure that it is firmly attached to the  stairs. Fix any carpet that is loose or worn. Avoid having throw rugs at the top or bottom of the stairs. If you do have throw rugs, attach them to the floor with carpet tape. Make sure that you have a light switch at the top of the stairs and the bottom of the stairs. If you do not have them, ask someone to add them for you. What else can I do to help prevent falls? Wear shoes that: Do not have high heels. Have rubber bottoms. Are comfortable and fit you well. Are closed at the toe. Do not wear sandals. If you use a stepladder: Make sure that it is fully opened. Do not climb a closed stepladder. Make sure that both sides of the stepladder are locked into place. Ask someone to  hold it for you, if possible. Clearly mark and make sure that you can see: Any grab bars or handrails. First and last steps. Where the edge of each step is. Use tools that help you move around (mobility aids) if they are needed. These include: Canes. Walkers. Scooters. Crutches. Turn on the lights when you go into a dark area. Replace any light bulbs as soon as they burn out. Set up your furniture so you have a clear path. Avoid moving your furniture around. If any of your floors are uneven, fix them. If there are any pets around you, be aware of where they are. Review your medicines with your doctor. Some medicines can make you feel dizzy. This can increase your chance of falling. Ask your doctor what other things that you can do to help prevent falls. This information is not intended to replace advice given to you by your health care provider. Make sure you discuss any questions you have with your health care provider. Document Released: 10/09/2009 Document Revised: 05/20/2016 Document Reviewed: 01/17/2015 Elsevier Interactive Patient Education  2017 [employer].

## 2021-11-13 NOTE — Progress Notes (Signed)
This visit occurred during the SARS-CoV-2 public health emergency.  Safety protocols were in place, including screening questions prior to the visit, additional usage of staff PPE, and extensive cleaning of exam room while observing appropriate contact time as indicated for disinfecting solutions.  Subjective:   Jonathan Brown is a 70 y.o. male who presents for Medicare Annual/Subsequent preventive examination.  Review of Systems     Cardiac Risk Factors include: advanced age (>73men, >33 women);diabetes mellitus;dyslipidemia;male gender     Objective:    Today's Vitals   11/13/21 0816  BP: 128/76  Pulse: 66  Temp: (!) 97.5 F (36.4 C)  TempSrc: Oral  SpO2: 98%  Weight: 192 lb 12.8 oz (87.5 kg)  Height: 5\' 8"  (1.727 m)   Body mass index is 29.32 kg/m.  Advanced Directives 11/13/2021 08/28/2021 11/15/2017 09/30/2017 09/27/2017 07/12/2017 04/30/2014  Does Patient Have a Medical Advance Directive? No No No No No No Patient does not have advance directive  Would patient like information on creating a medical advance directive? No - Patient declined - - No - Patient declined No - Patient declined No - Patient declined -    Current Medications (verified) Outpatient Encounter Medications as of 11/13/2021  Medication Sig   acetaminophen (TYLENOL) 500 MG tablet Take 1,000 mg by mouth in the morning and at bedtime.   amLODipine (NORVASC) 10 MG tablet TAKE 1 TABLET BY MOUTH EVERY DAY (Patient taking differently: Take 10 mg by mouth daily.)   aspirin EC 81 MG tablet Take 1 tablet (81 mg total) by mouth daily.   chlorproMAZINE (THORAZINE) 25 MG tablet Take 1 tablet (25 mg total) by mouth 3 (three) times daily as needed for hiccoughs.   cholecalciferol (VITAMIN D3) 25 MCG (1000 UNIT) tablet Take 1 tablet (1,000 Units total) by mouth daily.   fenofibrate (TRICOR) 48 MG tablet Take 1 tablet (48 mg total) by mouth daily.   glucose blood test strip 100 each by Other route daily. Use as instructed    Dispense 100 box   12 refills   hydrocortisone 2.5 % cream Apply topically 2 (two) times daily.   JARDIANCE 10 MG TABS tablet TAKE 1 TABLET BY MOUTH EVERY DAY BEFORE BREAKFAST (Patient taking differently: Take 10 mg by mouth daily.)   Lancets (ONETOUCH ULTRASOFT) lancets Use as instructed   latanoprost (XALATAN) 0.005 % ophthalmic solution Place 1 drop into both eyes at bedtime.   metFORMIN (GLUCOPHAGE) 500 MG tablet TAKE ONE TABLET BY MOUTH TWICE DAILY WITH  MEAL (Patient taking differently: Take 500 mg by mouth 2 (two) times daily with a meal.)   metoprolol tartrate (LOPRESSOR) 50 MG tablet Take 1 tablet (50 mg total) by mouth 2 (two) times daily.   omeprazole (PRILOSEC) 40 MG capsule TAKE 1 CAPSULE BY MOUTH EVERY DAY (Patient taking differently: Take 40 mg by mouth daily.)   rosuvastatin (CRESTOR) 20 MG tablet TAKE 1 TABLET BY MOUTH EVERYDAY AT BEDTIME (Patient taking differently: Take 20 mg by mouth at bedtime.)   valsartan-hydrochlorothiazide (DIOVAN-HCT) 160-12.5 MG tablet TAKE 1 TABLET BY MOUTH EVERY DAY IN THE MORNING (Patient taking differently: Take 1 tablet by mouth daily.)   vitamin B-12 (CYANOCOBALAMIN) 50 MCG tablet Take 50 mcg by mouth daily.   Vitamin E 100 units TABS Take 1 tablet by mouth daily.   Tetrahydrozoline HCl (VISINE OP) Apply 1 drop to eye daily as needed (dry eyes).   No facility-administered encounter medications on file as of 11/13/2021.    Allergies (verified) Patient has  no known allergies.   History: Past Medical History:  Diagnosis Date   Arthritis    Cataract 02/26/2021   Bilateral   CHB (complete heart block) (HCC) 10/05/2017   Diabetes mellitus without complication (HCC) 2014   type 2    Encounter for care of pacemaker 07/20/2019   Former smoker    GERD (gastroesophageal reflux disease)    Hyperlipidemia    Hypertension 2012   Open-angle glaucoma 02/26/2021   Pacemaker Medtronic Azure XT DR MRI [id number] Dual chamber pacemaker in situ 10/03/2017    PVD (peripheral vascular disease) (HCC)    S/P AVR    Aortic valve replacement using a 21-mm pericardial Magna Ease (09/29/2017 by Dr. 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    wife is able to read to him   Past Surgical History:  Procedure Laterality Date   AORTIC VALVE REPLACEMENT N/A 09/29/2017   Procedure: AORTIC VALVE REPLACEMENT (AVR) using 5mm Magna Ease valve;  Surgeon: Kerin Perna, MD;  Location: [facility] OR;  Service: Open Heart Surgery;  Laterality: N/A;   CARDIAC CATHETERIZATION  07/12/2017   COLONOSCOPY  2008   LEFT AND RIGHT HEART CATHETERIZATION WITH CORONARY ANGIOGRAM N/A 04/02/2014   Procedure: LEFT AND RIGHT HEART CATHETERIZATION WITH CORONARY ANGIOGRAM;  Surgeon: Pamella Pert, MD;  Location: [facility] CATH LAB;  Service: Cardiovascular;  Laterality: N/A;   PACEMAKER IMPLANT N/A 10/03/2017   Procedure: PACEMAKER IMPLANT;  Surgeon: Marinus Maw, MD;  Location: [facility] INVASIVE CV LAB;  Service: Cardiovascular;  Laterality: N/A;   TEE WITHOUT CARDIOVERSION N/A 04/30/2014   TEE WITHOUT CARDIOVERSION N/A 09/29/2017   Procedure: TRANSESOPHAGEAL ECHOCARDIOGRAM (TEE);  Surgeon: Donata Clay, Theron Arista, MD;  Location: [facility] OR;  Service: Open Heart Surgery;  Laterality: N/A;   Family History  Problem Relation Age of Onset   Other Mother        died of old age   Heart disease Father    Sleep apnea Sister    Diabetes Brother        diabetic coma x 3 years   Alcohol abuse Brother    Heart disease Brother 7       MI   Cancer Neg Hx    Stroke Neg Hx    Social History   Socioeconomic History   Marital status: Married    Spouse name: Not on file   Number of children: 0   Years of education: Not on file   Highest education level: Not on file  Occupational History   Not on file  Tobacco Use   Smoking status: Former    Packs/day: 0.25    Years: 30.00    Pack years: 7.50     Types: Cigarettes    Quit date: 09/28/2007    Years since quitting: 14.1   Smokeless tobacco: Never  Vaping Use   Vaping Use: Never used  Substance and Sexual Activity   Alcohol use: No   Drug use: Yes    Frequency: 7.0 times per week    Types: Marijuana    Comment: patient says he smokes marajuana once a day   Sexual activity: Yes  Other Topics Concern   Not on file  Social History Narrative   Married, walks for exercise, step- son lives in [place], [place].  05/2019   Social Determinants of Health   Financial Resource Strain: Low Risk  Difficulty of Paying Living Expenses: Not hard at all  Food Insecurity: No Food Insecurity   Worried About Running Out of Food in the Last Year: Never true   Ran Out of Food in the Last Year: Never true  Transportation Needs: No Transportation Needs   Lack of Transportation (Medical): No   Lack of Transportation (Non-Medical): No  Physical Activity: Inactive   Days of Exercise per Week: 0 days   Minutes of Exercise per Session: 0 min  Stress: No Stress Concern Present   Feeling of Stress : Not at all  Social Connections: Not on file    Tobacco Counseling Counseling given: Not Answered   Clinical Intake:  Pre-visit preparation completed: Yes  Pain : No/denies pain     Nutritional Status: BMI 25 -29 Overweight Nutritional Risks: None Diabetes: Yes  How often do you need to have someone help you when you read instructions, pamphlets, or other written materials from your doctor or pharmacy?: 1 - Never What is the last grade level you completed in school?: 12th grade  Diabetic? Yes Nutrition Risk Assessment:  Has the patient had any N/V/D within the last 2 months?  No  Does the patient have any non-healing wounds?  No  Has the patient had any unintentional weight loss or weight gain?  No   Diabetes:  Is the patient diabetic?  Yes  If diabetic, was a CBG obtained today?  No  Did the patient bring in their glucometer from home?   No  How often do you monitor your CBG's? daily.   Financial Strains and Diabetes Management:  Are you having any financial strains with the device, your supplies or your medication? No .  Does the patient want to be seen by Chronic Care Management for management of their diabetes?  No  Would the patient like to be referred to a Nutritionist or for Diabetic Management?  No   Diabetic Exams:  Diabetic Eye Exam: Completed 02/26/2021 Diabetic Foot Exam: Overdue, Pt has been advised about the importance in completing this exam. Pt is scheduled for diabetic foot exam on next appointment.   Interpreter Needed?: No  Information entered by :: NAllen LPN   Activities of Daily Living In your present state of health, do you have any difficulty performing the following activities: 11/13/2021  Hearing? N  Vision? Y  Comment a little blurry  Difficulty concentrating or making decisions? N  Walking or climbing stairs? N  Dressing or bathing? N  Doing errands, shopping? N  Preparing Food and eating ? N  Using the Toilet? N  In the past six months, have you accidently leaked urine? Y  Do you have problems with loss of bowel control? N  Managing your Medications? N  Managing your Finances? N  Housekeeping or managing your Housekeeping? N  Some recent data might be hidden    Patient Care Team: Tysinger, Kermit Balo, PA-C as PCP - General (Family Medicine) Verner Chol, [facility] as Pharmacist (Pharmacist)  Indicate any recent Medical Services you may have received from other than [facility] providers in the past year (date may be approximate).     Assessment:   This is a routine wellness examination for Green.  Hearing/Vision screen Vision Screening - Comments:: Regular eye exams,   Dietary issues and exercise activities discussed: Current Exercise Habits: The patient does not participate in regular exercise at present   Goals Addressed             This Visit's  Progress    Patient  Stated       11/13/2021, exercise more       Depression Screen PHQ 2/9 Scores 11/13/2021 09/01/2021 12/11/2020 09/09/2020 06/18/2019 06/15/2018 02/17/2018  PHQ - 2 Score 0 0 0 0 0 2 0  PHQ- 9 Score - - - - - 6 -    Fall Risk Fall Risk  11/13/2021 09/01/2021 12/11/2020 09/09/2020 09/09/2020  Falls in the past year? 0 0 0 0 0  Number falls in past yr: - 0 - - -  Injury with Fall? - 0 - - -  Risk for fall due to : Medication side effect No Fall Risks No Fall Risks - -  Follow up Falls evaluation completed;Education provided;Falls prevention discussed Falls evaluation completed Falls evaluation completed - -    FALL RISK PREVENTION PERTAINING TO THE HOME:  Any stairs in or around the home? No  If so, are there any without handrails?  N/a Home free of loose throw rugs in walkways, pet beds, electrical cords, etc? Yes  Adequate lighting in your home to reduce risk of falls? Yes   ASSISTIVE DEVICES UTILIZED TO PREVENT FALLS:  Life alert? No  Use of a cane, walker or w/c? No  Grab bars in the bathroom? No  Shower chair or bench in shower? No  Elevated toilet seat or a handicapped toilet? No   TIMED UP AND GO:  Was the test performed? No .     Gait steady and fast without use of assistive device  Cognitive Function:     6CIT Screen 11/13/2021  What Year? 0 points  What month? 0 points  What time? 0 points  Count back from 20 0 points  Months in reverse 4 points  Repeat phrase 8 points  Total Score 12    Immunizations Immunization History  Administered Date(s) Administered   Fluad Quad(high Dose 65+) 09/09/2020, 09/01/2021   Influenza, High Dose Seasonal PF 10/19/2018, 11/01/2019, 11/01/2019   Influenza,inj,Quad PF,6+ Mos 02/03/2016, 11/04/2016   PFIZER(Purple Top)SARS-COV-2 Vaccination 02/19/2020, 03/11/2020, 11/28/2020   Pneumococcal Conjugate-13 06/15/2018   Pneumococcal Polysaccharide-23 11/13/2021    TDAP status: Due, Education has been provided regarding the  importance of this vaccine. Advised may receive this vaccine at local pharmacy or Health Dept. Aware to provide a copy of the vaccination record if obtained from local pharmacy or Health Dept. Verbalized acceptance and understanding.  Flu Vaccine status: Up to date  Pneumococcal vaccine status: Due, Education has been provided regarding the importance of this vaccine. Advised may receive this vaccine at local pharmacy or Health Dept. Aware to provide a copy of the vaccination record if obtained from local pharmacy or Health Dept. Verbalized acceptance and understanding.  Covid-19 vaccine status: Completed vaccines  Qualifies for Shingles Vaccine? Yes   Zostavax completed No   Shingrix Completed?: No.    Education has been provided regarding the importance of this vaccine. Patient has been advised to call insurance company to determine out of pocket expense if they have not yet received this vaccine. Advised may also receive vaccine at local pharmacy or Health Dept. Verbalized acceptance and understanding.  Screening Tests Health Maintenance  Topic Date Due   TETANUS/TDAP  Never done   Zoster Vaccines- Shingrix (1 of 2) Never done   COVID-19 Vaccine (4 - Booster for Pfizer series) 01/23/2021   FOOT EXAM  09/09/2021   OPHTHALMOLOGY EXAM  02/26/2022   HEMOGLOBIN A1C  03/01/2022   Fecal DNA (Cologuard)  07/10/2022  Pneumonia Vaccine 35+ Years old  Completed   INFLUENZA VACCINE  Completed   Hepatitis C Screening  Completed   HPV VACCINES  Aged Out   COLONOSCOPY (Pts 45-67yrs Insurance coverage will need to be confirmed)  Discontinued    Health Maintenance  Health Maintenance Due  Topic Date Due   TETANUS/TDAP  Never done   Zoster Vaccines- Shingrix (1 of 2) Never done   COVID-19 Vaccine (4 - Booster for Pfizer series) 01/23/2021   FOOT EXAM  09/09/2021    Colorectal cancer screening: Type of screening: Cologuard. Completed 07/11/2019. Repeat every 3 years  Lung Cancer Screening:  (Low Dose CT Chest recommended if Age 71-80 years, 30 pack-year currently smoking OR have quit w/in 15years.) does not qualify.   Lung Cancer Screening Referral: no  Additional Screening:  Hepatitis C Screening: does qualify; Completed 06/14/2017  Vision Screening: Recommended annual ophthalmology exams for early detection of glaucoma and other disorders of the eye. Is the patient up to date with their annual eye exam?  Yes  Who is the provider or what is the name of the office in which the patient attends annual eye exams? Does not remember name If pt is not established with a provider, would they like to be referred to a provider to establish care? No .   Dental Screening: Recommended annual dental exams for proper oral hygiene  Community Resource Referral / Chronic Care Management: CRR required this visit?  No   CCM required this visit?  No      Plan:     I have personally reviewed and noted the following in the patient's chart:   Medical and social history Use of alcohol, tobacco or illicit drugs  Current medications and supplements including opioid prescriptions. Patient is not currently taking opioid prescriptions. Functional ability and status Nutritional status Physical activity Advanced directives List of other physicians Hospitalizations, surgeries, and ER visits in previous 12 months Vitals Screenings to include cognitive, depression, and falls Referrals and appointments  In addition, I have reviewed and discussed with patient certain preventive protocols, quality metrics, and best practice recommendations. A written personalized care plan for preventive services as well as general preventive health recommendations were provided to patient.     Barb Merino, LPN   [id number]   Nurse Notes: none

## 2021-11-26 ENCOUNTER — Other Ambulatory Visit: Payer: Self-pay

## 2021-11-26 ENCOUNTER — Ambulatory Visit: Payer: Medicare HMO | Admitting: Cardiology

## 2021-11-26 DIAGNOSIS — I442 Atrioventricular block, complete: Secondary | ICD-10-CM | POA: Diagnosis not present

## 2021-11-26 DIAGNOSIS — Z45018 Encounter for adjustment and management of other part of cardiac pacemaker: Secondary | ICD-10-CM | POA: Diagnosis not present

## 2021-11-26 DIAGNOSIS — Z950 Presence of cardiac pacemaker: Secondary | ICD-10-CM | POA: Diagnosis not present

## 2021-11-26 NOTE — Progress Notes (Signed)
Chief Complaint  Patient presents with   Pacemaker Check   Encounter for care of pacemaker  Pacemaker Medtronic Azure XT DR MRI [id number] Dual chamber pacemaker in situ  CHB (complete heart block) (HCC)  Patient activated transmission 11/25/2021: AP 7%, VP 99.99%.  Longevity 5 years and 8 months.  Lead impedance and threshold within normal limits.  There is 1 NSVT episode brief, 1 second.  There was brief atrial fibrillation for 37 seconds on 10/27/2021.  Normal pacemaker function.   Remote dual-chamber pacemaker transmission 11/13/2021: AP 10.9%, VP 99.98%.  Lead impedance and thresholds within normal limits.  Longevity 5 years and 10 months.  There was 1 monitored AT/AF episode, EGM = brief atrial fibrillation for 37 seconds.  There was 1 high ventricular rate episode, EGM = 4 beat NSVT.  Normal pacemaker function.  Scheduled  In office pacemaker check 11/26/21  Single (S)/Dual (D)/BV: D. Presenting ASVP. Pacemaker dependant:  Yes. Underlying CHB. AP 14%, VP 100%  AMS Episodes 1 EGM A. Fib for 37 Sec.  AT/AF burden <1% since Oct 27/2022  HVR 1. Longest Brief NSVT. Longevity 5.7 Years. Magnet rate: >85%. Lead measurements: Stable. Histogram: Low (L)/normal (N)/high (H)  Normal. Patient activity Good.   Observations: Normal pacemaker function. Changes: None.   Yates Decamp, MD, [facility] 11/26/2021, 2:19 PM Office: [phone] Fax: [phone] Pager: [phone]

## 2021-12-06 ENCOUNTER — Other Ambulatory Visit: Payer: Self-pay | Admitting: Medical

## 2021-12-10 ENCOUNTER — Telehealth: Payer: Self-pay | Admitting: Pharmacist

## 2021-12-10 NOTE — Chronic Care Management (AMB) (Addendum)
A user error has taken place: encounter opened in error, closed for administrative reasons.

## 2021-12-10 NOTE — Progress Notes (Signed)
Chronic Care Management Pharmacy Assistant   Name: Jonathan Brown  MRN: [id number] DOB: 02/19/1951  Reason for Encounter: Chart prep for initial encounter with Gaylord Shih Clinical Pharmacist on 12/22 at 9 am in office.   Conditions to be addressed/monitored: HTN, HLD, DMII, and Gastroesophageal reflux disease without esophagitis  Recent office visits:  11/13/21 Barb Merino, LPN - Patient presented for Medicare annual wellness exam. TDAP administered, Zoster Vaccine administered. No medication changes.  10/09/21 Tysinger, Kermit Balo, PA-C - Patient presented for Accidental overdose and other concerns. Stopped Amoxicillin - Pot Clavulanate 875  09/17/21 Tysinger, Kermit Balo, PA-C - Patient presented for Low phosphate levels and other concerns. No medication changes.  09/07/21 Tysinger, Kermit Balo, PA-C - Patient presented for Type 2 diabetes mellitus with complication without long term current use of insulin and other concerns. Prescribed Amoxicillin-Pot Clavulanate [phone]  09/01/21 Tysinger, Kermit Balo, PA-C - Patient presented for Leg laceration and other concerns. No medication changes.  Recent consult visits:  11/26/21 Yates Decamp, MD (Cardiology) - Patient presented for Encounter for care of pacemaker and other concerns. No medication changes.  10/26/21 Yates Decamp, MD (Cardiology) - Patient presented for Essential hypertension and other concerns. Prescribed Fenofibrate 48 mg daily.  10/21/21 Christia Reading, MD (Ophthalmology) - Patient presented for glaucoma and other concerns. No medication changes.  09/15/21 Delories Heinz, DPM (Podiatry) - Patient presented for Claudication and other concerns.No medication changes.  07/16/21 Christia Reading, MD (Ophthalmology) - Patient presented for Pseudophakia of left eye and other concerns. No medication changes.   Hospital visits:  Medication Reconciliation was completed by comparing discharge summary, patients EMR and  Pharmacy list, and upon discussion with patient.  Patent presented to [facility] ED on 09/28/21 due to Hiccups and Shortness of breath. Patient was present for 8 hours.  New?Medications Started at [facility] Discharge:?? -started  chlorproMAZINE 25 MG tablet  Medication Changes at Hospital Discharge: -Changed  None  Medications Discontinued at Hospital Discharge: -Stopped  None   Medications that remain the same after Hospital Discharge:??  -All other medications will remain the same.     Medication Reconciliation was completed by comparing discharge summary, patients EMR and Pharmacy list, and upon discussion with patient.  Patent presented to [facility] ED on 09/21/21 due to Opiate overdose. Patient was present for 4 hours.  New?Medications Started at [facility] Discharge:?? -started  None   Medication Changes at Hospital Discharge: -Changed  None  Medications Discontinued at Hospital Discharge: -Stopped  None   Medications that remain the same after Hospital Discharge:??  -All other medications will remain the same.     Patent presented to [facility] ED on 08/28/21 due to Laceration of right lower extremity. Patient was present for 3 hours.  New?Medications Started at [facility] Discharge:?? -started  None   Medication Changes at Hospital Discharge: -Changed  None  Medications Discontinued at Hospital Discharge: -Stopped  None   Medications that remain the same after Hospital Discharge:??  -All other medications will remain the same.    Medications: Outpatient Encounter Medications as of 12/10/2021  Medication Sig   acetaminophen (TYLENOL) 500 MG tablet Take 1,000 mg by mouth in the morning and at bedtime.   amLODipine (NORVASC) 10 MG tablet TAKE 1 TABLET BY MOUTH EVERY DAY (Patient taking differently: Take 10 mg by mouth daily.)   aspirin EC 81 MG tablet Take 1 tablet (81 mg total) by mouth daily.  chlorproMAZINE (THORAZINE) 25 MG tablet Take 1 tablet (25 mg total) by mouth 3 (three) times daily as needed for hiccoughs.   cholecalciferol (VITAMIN D3) 25 MCG (1000 UNIT) tablet Take 1 tablet (1,000 Units total) by mouth daily.   fenofibrate (TRICOR) 48 MG tablet Take 1 tablet (48 mg total) by mouth daily.   glucose blood test strip 100 each by Other route daily. Use as instructed   Dispense 100 box   12 refills   hydrocortisone 2.5 % cream Apply topically 2 (two) times daily.   JARDIANCE 10 MG TABS tablet TAKE 1 TABLET BY MOUTH EVERY DAY BEFORE BREAKFAST   Lancets (ONETOUCH ULTRASOFT) lancets Use as instructed   latanoprost (XALATAN) 0.005 % ophthalmic solution Place 1 drop into both eyes at bedtime.   metFORMIN (GLUCOPHAGE) 500 MG tablet TAKE ONE TABLET BY MOUTH TWICE DAILY WITH  MEAL (Patient taking differently: Take 500 mg by mouth 2 (two) times daily with a meal.)   metoprolol tartrate (LOPRESSOR) 50 MG tablet Take 1 tablet (50 mg total) by mouth 2 (two) times daily.   omeprazole (PRILOSEC) 40 MG capsule TAKE 1 CAPSULE BY MOUTH EVERY DAY (Patient taking differently: Take 40 mg by mouth daily.)   rosuvastatin (CRESTOR) 20 MG tablet TAKE 1 TABLET BY MOUTH EVERYDAY AT BEDTIME (Patient taking differently: Take 20 mg by mouth at bedtime.)   Tetrahydrozoline HCl (VISINE OP) Apply 1 drop to eye daily as needed (dry eyes).   valsartan-hydrochlorothiazide (DIOVAN-HCT) 160-12.5 MG tablet TAKE 1 TABLET BY MOUTH EVERY DAY IN THE MORNING (Patient taking differently: Take 1 tablet by mouth daily.)   vitamin B-12 (CYANOCOBALAMIN) 50 MCG tablet Take 50 mcg by mouth daily.   Vitamin E 100 units TABS Take 1 tablet by mouth daily.   No facility-administered encounter medications on file as of 12/10/2021.  Fill History : AMLODIPINE BESYLATE 10 MG TAB 09/10/2021 90   CHLORPROMAZINE 25 MG TABLET 09/28/2021 10   FENOFIBRATE 48 MG TABLET 10/30/2021 90   ONETOUCH VERIO TEST STRIP 03/07/2021 90    HYDROCORTISONE 2.5% CREAM 01/08/2021 30   JARDIANCE 10 MG TABLET 09/08/2021 90   ONETOUCH DELICA PLUS 33G LANCT 11/28/2020 90   LATANOPROST 0.005% EYE DROPS 09/13/2021 25   METFORMIN HCL 500 MG TABLET 11/24/2021 90   METOPROLOL TARTRATE 50 MG TAB 11/24/2021 90   OMEPRAZOLE DR 40 MG CAPSULE 10/13/2021 90   ROSUVASTATIN CALCIUM 20 MG TAB 11/24/2021 90   VALSARTN HCT 160-12.5 TAB 11/03/2021 90   VITAMIN D3 25 MCG TABLET 09/21/2021 90   Spoke to wife for call  Have you seen any other providers since your last visit?  Wife reports none  Any changes in your medications or health? Wife reports not recently  Any side effects from any medications? Wife reports she has not noticed and he has not complained of any  Do you have an symptoms or problems not managed by your medications? Wife reports no  Any concerns about your health right now? Wife reports that he has issues with compliance of his medications, needs educating on the times to take and any thing that may assist him in remembering at what time to take and to take.  Has your provider asked that you check blood pressure, blood sugar, or follow special diet at home? Wife reports they do not have a blood pressure machine at home, he has a meter and does not check as she does not think it is working properly.  Do you get any type  of exercise on a regular basis? Wife reports he regularly is doing yard work  Can you think of a goal you would like to reach for your health? Wife reports not at this time other than compliance with meds/times  Do you have any problems getting your medications? Wife reports they are happy with [facility]  Is there anything that you would like to discuss during the appointment? Wife reports none  She is aware that I will give them a reminder call a few days prior to the appointment.  Care Gaps: BP- 128/76 (11/13/21) AWV- 11/13/21 TDAP - Administered on 11/13/21 Zoster Vaccine - Administered on  11/13/21 COVID Booster - Overdue Lab Results  Component Value Date   HGBA1C 6.4 (H) 09/01/2021    Star Rating Drugs: Empagliflozin (Jardiance) 10 mg - Last filled 09/08/21 90 [facility] Verified as accurate he has not picked up yet was filled again on 12/07/21 Metformin 500 mg - Last filled 11/24/21 90 [facility] Rosuvastatin 20 mg - Last filled 11/24/21 90 [facility] Valsartan HCTZ- 160-12.5 mg - Last filled 11/03/21 90 [facility]    Pamala Duffel CMA Clinical Pharmacist Assistant [phone]

## 2021-12-15 ENCOUNTER — Ambulatory Visit: Payer: Medicare HMO | Admitting: Podiatry

## 2021-12-15 ENCOUNTER — Telehealth: Payer: Self-pay | Admitting: Pharmacist

## 2021-12-15 NOTE — Chronic Care Management (AMB) (Signed)
° ° °  Chronic Care Management Pharmacy Assistant   Name: Jonathan Brown  MRN: [id number] DOB: [birth year]  12/15/21 APPOINTMENT REMINDER    Patient was reminded to have all medications, supplements and any blood glucose and blood pressure readings available for review with Gaylord Shih, Pharm. D, for office visit on 12/22 at 9 am.  Wife reports she has an appointment the same day so they will be there for in office on that day at 9 not phone call .  Care Gaps: BP- 128/76 (11/13/21) AWV- 11/13/21 TDAP - Administered on 11/13/21 Zoster Vaccine - Administered on 11/13/21 COVID Booster - Overdue  Star Rating Drug: Empagliflozin (Jardiance) 10 mg - Last filled 12/07/21 90 [facility]  Metformin 500 mg - Last filled 11/24/21 90 [facility] Rosuvastatin 20 mg - Last filled 11/24/21 90 [facility] Valsartan HCTZ- 160-12.5 mg - Last filled 11/03/21 90 [facility]  Any gaps in medications fill history? None   Medications: Outpatient Encounter Medications as of 12/15/2021  Medication Sig   acetaminophen (TYLENOL) 500 MG tablet Take 1,000 mg by mouth in the morning and at bedtime.   amLODipine (NORVASC) 10 MG tablet TAKE 1 TABLET BY MOUTH EVERY DAY (Patient taking differently: Take 10 mg by mouth daily.)   aspirin EC 81 MG tablet Take 1 tablet (81 mg total) by mouth daily.   chlorproMAZINE (THORAZINE) 25 MG tablet Take 1 tablet (25 mg total) by mouth 3 (three) times daily as needed for hiccoughs.   cholecalciferol (VITAMIN D3) 25 MCG (1000 UNIT) tablet Take 1 tablet (1,000 Units total) by mouth daily.   fenofibrate (TRICOR) 48 MG tablet Take 1 tablet (48 mg total) by mouth daily.   glucose blood test strip 100 each by Other route daily. Use as instructed   Dispense 100 box   12 refills   hydrocortisone 2.5 % cream Apply topically 2 (two) times daily.   JARDIANCE 10 MG TABS tablet TAKE 1 TABLET BY MOUTH EVERY DAY BEFORE BREAKFAST   Lancets (ONETOUCH ULTRASOFT) lancets Use as instructed    latanoprost (XALATAN) 0.005 % ophthalmic solution Place 1 drop into both eyes at bedtime.   metFORMIN (GLUCOPHAGE) 500 MG tablet TAKE ONE TABLET BY MOUTH TWICE DAILY WITH  MEAL (Patient taking differently: Take 500 mg by mouth 2 (two) times daily with a meal.)   metoprolol tartrate (LOPRESSOR) 50 MG tablet Take 1 tablet (50 mg total) by mouth 2 (two) times daily.   omeprazole (PRILOSEC) 40 MG capsule TAKE 1 CAPSULE BY MOUTH EVERY DAY (Patient taking differently: Take 40 mg by mouth daily.)   rosuvastatin (CRESTOR) 20 MG tablet TAKE 1 TABLET BY MOUTH EVERYDAY AT BEDTIME (Patient taking differently: Take 20 mg by mouth at bedtime.)   Tetrahydrozoline HCl (VISINE OP) Apply 1 drop to eye daily as needed (dry eyes).   valsartan-hydrochlorothiazide (DIOVAN-HCT) 160-12.5 MG tablet TAKE 1 TABLET BY MOUTH EVERY DAY IN THE MORNING (Patient taking differently: Take 1 tablet by mouth daily.)   vitamin B-12 (CYANOCOBALAMIN) 50 MCG tablet Take 50 mcg by mouth daily.   Vitamin E 100 units TABS Take 1 tablet by mouth daily.   No facility-administered encounter medications on file as of 12/15/2021.     Pamala Duffel CMA Clinical Pharmacist Assistant [phone]

## 2021-12-16 ENCOUNTER — Ambulatory Visit: Payer: Medicare HMO | Admitting: Podiatry

## 2021-12-16 ENCOUNTER — Other Ambulatory Visit: Payer: Self-pay

## 2021-12-16 ENCOUNTER — Encounter: Payer: Self-pay | Admitting: Podiatry

## 2021-12-16 DIAGNOSIS — M79609 Pain in unspecified limb: Secondary | ICD-10-CM

## 2021-12-16 DIAGNOSIS — I739 Peripheral vascular disease, unspecified: Secondary | ICD-10-CM | POA: Diagnosis not present

## 2021-12-16 DIAGNOSIS — B351 Tinea unguium: Secondary | ICD-10-CM

## 2021-12-16 DIAGNOSIS — E1141 Type 2 diabetes mellitus with diabetic mononeuropathy: Secondary | ICD-10-CM | POA: Diagnosis not present

## 2021-12-16 NOTE — Progress Notes (Signed)
This patient returns to my office for at risk foot care.  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  This patient is unable to cut nails himself since the patient cannot reach his nails.These nails are painful walking and wearing shoes.  This patient presents for at risk foot care today.  General Appearance  Alert, conversant and in no acute stress.  Vascular  Dorsalis pedis and posterior tibial  pulses are palpable  bilaterally.  Capillary return is within normal limits  bilaterally. Temperature is within normal limits  bilaterally.  Neurologic  Senn-Weinstein monofilament wire test within normal limits  bilaterally. Muscle power within normal limits bilaterally.  Nails Thick disfigured discolored nails with subungual debris  from hallux to fifth toes bilaterally. No evidence of bacterial infection or drainage bilaterally.  Orthopedic  No limitations of motion  feet .  No crepitus or effusions noted.  No bony pathology or digital deformities noted.  Skin  normotropic skin with no porokeratosis noted bilaterally.  No signs of infections or ulcers noted.     Onychomycosis  Pain in right toes  Pain in left toes  Consent was obtained for treatment procedures.   Mechanical debridement of nails 1-5  bilaterally performed with a nail nipper.  Filed with dremel without incident.    Return office visit   4  months                   Told patient to return for periodic foot care and evaluation due to potential at risk complications.   Taffie Eckmann DPM  

## 2021-12-17 ENCOUNTER — Telehealth: Payer: Medicare HMO

## 2021-12-17 ENCOUNTER — Telehealth: Payer: Self-pay | Admitting: Medical

## 2021-12-17 ENCOUNTER — Ambulatory Visit (INDEPENDENT_AMBULATORY_CARE_PROVIDER_SITE_OTHER): Payer: Medicare HMO | Admitting: Pharmacist

## 2021-12-17 DIAGNOSIS — E118 Type 2 diabetes mellitus with unspecified complications: Secondary | ICD-10-CM

## 2021-12-17 DIAGNOSIS — I10 Essential (primary) hypertension: Secondary | ICD-10-CM

## 2021-12-17 NOTE — Progress Notes (Signed)
Chronic Care Management Pharmacy Note  12/17/2021 Name:  Jonathan Brown MRN:  [id number] DOB:  1951-12-03  Summary: Triglycerides are above goal < 150 Pt is not consistently checking BP at home  Recommendations/Changes made from today's visit: -Consider increasing vitamin B12 supplementation and stopping vitamin E -Recommend step down for omeprazole to every other day -Recommended bringing BP cuff to next office visit to ensure accuracy -Recommended moving amlodipine to the evening to even out BP lowering  Plan: BP and DM assessment in 2 months   Subjective: Jonathan Brown is an 70 y.o. year old male who is a primary patient of Carlena Hurl, PA-C.  The CCM team was consulted for assistance with disease management and care coordination needs.    Engaged with patient face to face for initial visit in response to provider referral for pharmacy case management and/or care coordination services.   Consent to Services:  The patient was given the following information about Chronic Care Management services today, agreed to services, and gave verbal consent: 1. CCM service includes personalized support from designated clinical staff supervised by the primary care provider, including individualized plan of care and coordination with other care providers 2. 24/7 contact phone numbers for assistance for urgent and routine care needs. 3. Service will only be billed when office clinical staff spend 20 minutes or more in a month to coordinate care. 4. Only one practitioner may furnish and bill the service in a calendar month. 5.The patient may stop CCM services at any time (effective at the end of the month) by phone call to the office staff. 6. The patient will be responsible for cost sharing (co-pay) of up to 20% of the service fee (after annual deductible is met). Patient agreed to services and consent obtained.  Patient Care Team: Tysinger, Leward Quan as PCP - General (Family  Medicine) Viona Gilmore, [facility] as Pharmacist (Pharmacist)  Recent office visits: 11/13/21 Kellie Simmering, LPN - Patient presented for Medicare annual wellness exam.    10/09/21 Tysinger, Camelia Eng, PA-C - Patient presented for Accidental overdose and other concerns. Stopped Amoxicillin - Pot Clavulanate 875   09/17/21 Tysinger, Camelia Eng, PA-C - Patient presented for Low phosphate levels and other concerns. No medication changes.   09/07/21 Tysinger, Camelia Eng, PA-C - Patient presented for Type 2 diabetes mellitus with complication without long term current use of insulin and other concerns. Prescribed Amoxicillin-Pot Clavulanate [phone]   09/01/21 Tysinger, Camelia Eng, PA-C - Patient presented for Leg laceration and other concerns. No medication changes.  Recent consult visits: 12/16/21 Gardiner Barefoot, DPM (podiatry): Patient presented for nail trim.  11/26/21 Adrian Prows, MD (Cardiology) - Patient presented for Encounter for care of pacemaker and other concerns. No medication changes.   10/26/21 Adrian Prows, MD (Cardiology) - Patient presented for Essential hypertension and other concerns. Prescribed Fenofibrate 48 mg daily.   10/21/21 Hyman Hopes, MD (Ophthalmology) - Patient presented for glaucoma and other concerns. No medication changes.   09/15/21 Bronson Ing, DPM (Podiatry) - Patient presented for Claudication and other concerns.No medication changes.   07/16/21 Hyman Hopes, MD (Ophthalmology) - Patient presented for Pseudophakia of left eye and other concerns. No medication changes.   Hospital visits: Medication Reconciliation was completed by comparing discharge summary, patients EMR and Pharmacy list, and upon discussion with patient.   Patent presented to [facility] ED on 09/28/21 due to Hiccups and Shortness of breath. Patient was present for 8  hours.   New?Medications Started at [facility] Discharge:?? -started  chlorproMAZINE 25 MG tablet    Medication Changes at Hospital Discharge: -Changed  None   Medications Discontinued at Hospital Discharge: -Stopped  None    Medications that remain the same after Hospital Discharge:??  -All other medications will remain the same.       Medication Reconciliation was completed by comparing discharge summary, patients EMR and Pharmacy list, and upon discussion with patient.   Patent presented to [facility] ED on 09/21/21 due to Opiate overdose. Patient was present for 4 hours.   New?Medications Started at [facility] Discharge:?? -started  None    Medication Changes at Hospital Discharge: -Changed  None   Medications Discontinued at Hospital Discharge: -Stopped  None    Medications that remain the same after Hospital Discharge:??  -All other medications will remain the same.       Patent presented to [facility] ED on 08/28/21 due to Laceration of right lower extremity. Patient was present for 3 hours.   New?Medications Started at [facility] Discharge:?? -started  None    Medication Changes at Hospital Discharge: -Changed  None   Medications Discontinued at Hospital Discharge: -Stopped  None    Medications that remain the same after Hospital Discharge:??  -All other medications will remain the same.     Objective:  Lab Results  Component Value Date   CREATININE 1.30 (H) 09/28/2021   BUN 19 09/28/2021   GFRNONAA >60 09/28/2021   GFRAA 63 12/11/2020   NA 139 09/28/2021   K 3.4 (L) 09/28/2021   CALCIUM 9.8 09/28/2021   CO2 22 09/28/2021   GLUCOSE 104 (H) 09/28/2021    Lab Results  Component Value Date/Time   HGBA1C 6.4 (H) 09/01/2021 11:53 AM   HGBA1C 6.1 (A) 12/12/2020 11:26 AM   HGBA1C 5.8 (H) 09/09/2020 01:14 PM   MICROALBUR 0.2 03/14/2017 11:02 AM   MICROALBUR 1.8 02/03/2016 12:01 AM    Last diabetic Eye exam:  Lab Results  Component Value Date/Time   HMDIABEYEEXA No Retinopathy 02/26/2021 12:00 AM    Last  diabetic Foot exam: No results found for: HMDIABFOOTEX   Lab Results  Component Value Date   CHOL 113 10/29/2021   HDL 29 (L) 10/29/2021   LDLCALC 45 10/29/2021   TRIG 247 (H) 10/29/2021   CHOLHDL 4.0 09/09/2020    Hepatic Function Latest Ref Rng & Units 09/28/2021 09/01/2021 09/09/2020  Total Protein 6.5 - 8.1 g/dL 6.9 6.7 7.2  Albumin 3.5 - 5.0 g/dL 3.8 4.6 4.3  AST 15 - 41 U/L 18 19 16   ALT 0 - 44 U/L 19 17 22   Alk Phosphatase 38 - 126 U/L 60 87 95  Total Bilirubin 0.3 - 1.2 mg/dL 0.9 0.3 0.5  Bilirubin, Direct 0.00 - 0.40 mg/dL - <0.10 -    Lab Results  Component Value Date/Time   TSH 2.350 09/01/2021 11:53 AM   TSH 2.16 06/14/2017 08:35 AM    CBC Latest Ref Rng & Units 09/28/2021 09/28/2021 09/21/2021  WBC 4.0 - 10.5 K/uL - 7.6 7.8  Hemoglobin 13.0 - 17.0 g/dL 12.2(L) 11.7(L) 11.4(L)  Hematocrit 39.0 - 52.0 % 36.0(L) 35.8(L) 35.9(L)  Platelets 150 - 400 K/uL - 204 248    Lab Results  Component Value Date/Time   VD25OH 32.2 09/17/2021 10:58 AM    Clinical ASCVD: Yes  The ASCVD Risk score (Arnett DK, et al., 2019) failed to calculate for the following reasons:   The valid  total cholesterol range is 130 to 320 mg/dL    Depression screen [facility] 2/9 11/13/2021 09/01/2021 12/11/2020  Decreased Interest 0 0 0  Down, Depressed, Hopeless 0 0 0  PHQ - 2 Score 0 0 0  Altered sleeping - - -  Tired, decreased energy - - -  Change in appetite - - -  Feeling bad or failure about yourself  - - -  Trouble concentrating - - -  Moving slowly or fidgety/restless - - -  Suicidal thoughts - - -  PHQ-9 Score - - -     Social History   Tobacco Use  Smoking Status Former   Packs/day: 0.25   Years: 30.00   Pack years: 7.50   Types: Cigarettes   Quit date: 09/28/2007   Years since quitting: 14.2  Smokeless Tobacco Never   BP Readings from Last 3 Encounters:  11/13/21 128/76  10/26/21 115/69  09/29/21 (!) 138/94   Pulse Readings from Last 3 Encounters:  11/13/21 66  10/26/21  76  09/29/21 78   Wt Readings from Last 3 Encounters:  11/13/21 192 lb 12.8 oz (87.5 kg)  10/26/21 187 lb (84.8 kg)  09/29/21 185 lb 12.8 oz (84.3 kg)   BMI Readings from Last 3 Encounters:  11/13/21 29.32 kg/m  10/26/21 29.73 kg/m  09/29/21 29.54 kg/m    Assessment/Interventions: Review of patient past medical history, allergies, medications, health status, including review of consultants reports, laboratory and other test data, was performed as part of comprehensive evaluation and provision of chronic care management services.   SDOH:  (Social Determinants of Health) assessments and interventions performed: Yes SDOH Interventions    Flowsheet Row Most Recent Value  SDOH Interventions   Financial Strain Interventions Intervention Not Indicated  Transportation Interventions Intervention Not Indicated      SDOH Screenings   Alcohol Screen: Not on file  Depression (PHQ2-9): Low Risk    PHQ-2 Score: 0  Financial Resource Strain: Low Risk    Difficulty of Paying Living Expenses: Not hard at all  Food Insecurity: No Food Insecurity   Worried About Charity fundraiser in the Last Year: Never true   Arboriculturist in the Last Year: Never true  Housing: Not on file  Physical Activity: Inactive   Days of Exercise per Week: 0 days   Minutes of Exercise per Session: 0 min  Social Connections: Not on file  Stress: No Stress Concern Present   Feeling of Stress : Not at all  Tobacco Use: Medium Risk   Smoking Tobacco Use: Former   Smokeless Tobacco Use: Never   Passive Exposure: Not on file  Transportation Needs: No Transportation Needs   Lack of Transportation (Medical): No   Lack of Transportation (Non-Medical): No   Wife reports that he has issues with compliance of his medications, needs educating on the times to take and any thing that may assist him in remembering at what time to take and to take.  Wife reports they do not have a blood pressure machine at home, he has  a meter and does not check as she does not think it is working properly.  Wife reports he regularly is doing yard work  Patient stays active mostly every day and reports he is motivated to do yard work mostly and helps his neighbors and family often. His neighbors are older and he brings them to the store. 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  He was going to the [facility] pre-COVID but plans to start back up in the new year. He does some physical labeor at home with raking leaves, helping his nephew move, and helping his neighbor get a stove off the truck. Patient isn't involved in any organizations and doesn't have any hobbies.  Patient lives with wife and has lots of family nearby. He has 2 sisters and several nieces and nephews and great nieces and nephews. Sometimes he feels like he sees them too often as he is asked to help with a lot.   Patient fixes breakfast in morning and they usually eat out or get takeout for lunch and his wife cooks supper. He reports he cooks about 40% of the time and she does 60%.   Patient goes to bed early around 9:30 every night and wakes up at 2-3am and sits in chair for a few hours but struggles to go back to bed. Patient feels tired during the day sometimes and sometimes takes naps for an hour or two.   Patient denies issues with his medications. He feels he knows what all of them are for but is not familiar with the names, only by their shapes.  CCM Care Plan  No Known Allergies  Medications Reviewed Today     Reviewed by Viona Gilmore, [facility] (Pharmacist) on 12/17/21 at [id number]  Med List Status: <None>   Medication Order Taking? Sig Documenting Provider Last Dose Status Informant  acetaminophen (TYLENOL) 500 MG tablet [id number]  Take 1,000 mg by mouth in the morning and at bedtime.  [provider]  Active Multiple Informants  amLODipine (NORVASC) 10 MG tablet [id number] Yes TAKE 1 TABLET BY MOUTH EVERY DAY  Patient taking differently: Take 10 mg by mouth daily.   Adrian Prows, MD Taking Active Multiple Informants  aspirin EC 81 MG tablet [id number] Yes Take 1 tablet (81 mg total) by mouth daily. Tysinger, Camelia Eng, PA-C Taking Active Multiple Informants  cholecalciferol (VITAMIN D3) 25 MCG (1000 UNIT) tablet [id number] Yes Take 1 tablet (1,000 Units total) by mouth daily. Tysinger, Camelia Eng, PA-C Taking Active Multiple Informants  fenofibrate (TRICOR) 48 MG tablet [id number] Yes Take 1 tablet (48 mg total) by mouth daily. Adrian Prows, MD Taking Active   glucose blood test strip [id number]  100 each by Other route daily. Use as instructed   Dispense 100 box   12 refills Carlena Hurl, PA-C  Active Multiple Informants  hydrocortisone 2.5 % cream [id number] Yes Apply topically 2 (two) times daily. Carlena Hurl, PA-C Taking Active Multiple Informants  JARDIANCE 10 MG TABS tablet [id number] Yes TAKE 1 TABLET BY MOUTH EVERY DAY BEFORE BREAKFAST Tysinger, Camelia Eng, PA-C Taking Active   Lancets [facility] ULTRASOFT) lancets [id number]  Use as instructed Tysinger, Camelia Eng, PA-C  Active Multiple Informants  latanoprost (XALATAN) 0.005 % ophthalmic solution [id number] Yes Place 1 drop into both eyes at bedtime. [provider] Taking Active Multiple Informants  metFORMIN (GLUCOPHAGE) 500 MG tablet [id number] Yes TAKE ONE TABLET BY MOUTH TWICE DAILY WITH  MEAL  Patient taking differently: Take 500 mg by mouth 2 (two) times daily with a meal.   Tysinger, Camelia Eng, PA-C Taking Active Multiple Informants  metoprolol tartrate (LOPRESSOR) 50 MG tablet [id number] Yes Take 1 tablet (50 mg total) by mouth 2 (two) times daily. Tysinger, Camelia Eng, PA-C Taking  Active Multiple Informants  omeprazole (PRILOSEC) 40 MG capsule [id number] Yes TAKE 1 CAPSULE BY MOUTH EVERY DAY  Patient taking  differently: Take 40 mg by mouth daily.   Tysinger, Camelia Eng, PA-C Taking Active Multiple Informants  rosuvastatin (CRESTOR) 20 MG tablet [id number] Yes TAKE 1 TABLET BY MOUTH EVERYDAY AT BEDTIME  Patient taking differently: Take 20 mg by mouth at bedtime.   Tysinger, Camelia Eng, PA-C Taking Active Multiple Informants  Tetrahydrozoline HCl (VISINE OP) [id number] Yes Apply 1 drop to eye daily as needed (dry eyes). [provider] Taking Active Multiple Informants  valsartan-hydrochlorothiazide (DIOVAN-HCT) 160-12.5 MG tablet [id number] Yes TAKE 1 TABLET BY MOUTH EVERY DAY IN THE MORNING  Patient taking differently: Take 1 tablet by mouth daily.   Tysinger, Camelia Eng, PA-C Taking Active Multiple Informants  vitamin B-12 (CYANOCOBALAMIN) 50 MCG tablet [id number] Yes Take 50 mcg by mouth daily. [provider] Taking Active Multiple Informants  Vitamin E 100 units TABS [id number] Yes Take 1 tablet by mouth daily. [provider] Taking Active Multiple Informants            Patient Active Problem List   Diagnosis Date Noted   Accidental overdose 09/30/2021   Anemia 09/30/2021   Substance abuse ([place]) 09/30/2021   Abnormal lung scan 09/30/2021   Abnormal PFT 09/30/2021   Aortic atherosclerosis ([place]) 09/30/2021   Visit for wound check 09/07/2021   Nail hypertrophy 09/07/2021   Leg laceration, right, sequela 09/01/2021   Fatigue 09/01/2021   Needs flu shot 09/01/2021   Encounter for health maintenance examination in adult 09/09/2020   Hyperlipidemia associated with type 2 diabetes mellitus ([place]) 09/09/2020   Encounter for care of pacemaker 07/20/2019   Medicare annual wellness visit, subsequent 06/18/2019   Advanced directives, counseling/discussion 06/18/2019   PVD (peripheral vascular disease) ([place]) 06/15/2018   CHB (complete heart block) ([place]) 10/05/2017   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 10/03/2017   S/P AVR 09/29/2017    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   Pleural plaque 06/14/2017   Screening for prostate cancer 06/14/2017   Former smoker 06/14/2017   Vaccine counseling 03/14/2017   Poor diet 11/04/2016   Diabetic mononeuropathy associated with type 2 diabetes mellitus ([place]) 02/03/2016   Gastroesophageal reflux disease without esophagitis 02/03/2016   Essential hypertension 02/03/2016   Type 2 diabetes mellitus with complication, without long-term current use of insulin ([place]) 02/03/2016   Need for prophylactic vaccination and inoculation against influenza 02/03/2016   Atherosclerosis of native artery of both lower extremities with intermittent claudication ([place]) 02/03/2016    Immunization History  Administered Date(s) Administered   Fluad Quad(high Dose 65+) 09/09/2020, 09/01/2021   Influenza, High Dose Seasonal PF 10/19/2018, 11/01/2019, 11/01/2019   Influenza,inj,Quad PF,6+ Mos 02/03/2016, 11/04/2016   PFIZER(Purple Top)SARS-COV-2 Vaccination 02/19/2020, 03/11/2020, 11/28/2020   Pneumococcal Conjugate-13 06/15/2018   Pneumococcal Polysaccharide-23 11/13/2021    Conditions to be addressed/monitored:  Hypertension, Hyperlipidemia, Diabetes, and GERD  Care Plan : [place]  Updates made by Viona Gilmore, [place] since 12/17/2021 12:00 AM     Problem: Problem: Hypertension, Hyperlipidemia, Diabetes, and GERD      Long-Range Goal: Patient-Specific Goal   Start Date: 12/17/2021  Expected End Date: 12/17/2022  This Visit's Progress: On track  Priority: High  Note:   Current Barriers:  Unable to independently monitor therapeutic efficacy Unable to achieve control of triglycerides  Unable to self administer medications as prescribed  Pharmacist Clinical Goal(s):  Patient will achieve adherence to monitoring guidelines and medication adherence to achieve therapeutic efficacy achieve control of triglycerides as evidenced by next lipid panel  through collaboration with PharmD and  provider.   Interventions: 1:1 collaboration with Tysinger, Camelia Eng, PA-C regarding development and update of comprehensive plan of care as evidenced by provider attestation and co-signature Inter-disciplinary care team collaboration (see longitudinal plan of care) Comprehensive medication review performed; medication list updated in electronic medical record  Hypertension (BP goal <130/80) -Not ideally controlled -Current treatment: Amlodipine 10 mg 1 tablet daily Valsartan-HCTZ 160-12.5 mg 1 tablet daily Metoprolol tartrate 50 mg 1 tablet twice daily -Medications previously tried: n/a  -Current home readings:  unsure of readings (wife checks it once a week) - arm cuff, never brought it into office -Current dietary habits: eating a lot of porkchops, a little bit of beef, some chicken (more than beef), fish every Friday; wife does cook with salt; he doesn't add much afterwards; a lot of canned product - uses low sodium; frozen vegetables -Current exercise habits: no formal exercise -Denies hypotensive/hypertensive symptoms -Educated on BP goals and benefits of medications for prevention of heart attack, stroke and kidney damage; Importance of home blood pressure monitoring; Proper BP monitoring technique; Symptoms of hypotension and importance of maintaining adequate hydration; -Counseled to monitor BP at home weekly, document, and provide log at future appointments -Counseled on diet and exercise extensively Recommended to continue current medication  Hyperlipidemia: (LDL goal < 70) -Controlled -Current treatment: Rosuvastatin 20 mg 1 tablet daily Fenofibrate 48 mg 1 tablet daily -Medications previously tried: none  -Current dietary patterns: eating fried foods often and drinking pepsi daily -Current exercise habits: no formal exercise -Educated on Cholesterol goals;  Benefits of statin for ASCVD risk reduction; Importance of limiting foods high in cholesterol; Exercise goal of  150 minutes per week; -Counseled on diet and exercise extensively Recommended to continue current medication  PVD/Atherosclerosis  (Goal: prevent heart events) -Controlled -Current treatment  Aspirin 81 mg 1 tablet daily Rosuvastatin 20 mg 1 tablet daily -Medications previously tried: none  -Recommended to continue current medication Counseled on benefits of aspirin therapy and avoiding use of other NSAIDs.  Diabetes (A1c goal <7%) -Controlled -Current medications: Jardiance 10 mg 1 tablet daily Metformin 500 mg 1 tablet twice daily -Medications previously tried: n/a  -Current home glucose readings fasting glucose: 142 - wife has been checking once a week - checking in the mornings (at least once a week) - checking couple time a week post prandial glucose: n/a -Denies hypoglycemic/hyperglycemic symptoms -Current meal patterns:  breakfast: eggs, grits, toast and wife drinks orange juice, black coffee  lunch: eating out almost every day - hamburger and french fries yesterday  dinner: green beans, green peas, porkchops snacks: little debbie's cookies drinks: regular pepsi and water (more pepsi) -Current exercise: no formal exercise -Educated on A1c and blood sugar goals; Exercise goal of 150 minutes per week; Benefits of routine self-monitoring of blood sugar; Carbohydrate counting and/or plate method -Counseled to check feet daily and get yearly eye exams -Counseled on diet and exercise extensively Recommended to continue current medication Recommended drinking Pepsi every other day and adding in more water. Recommended alternating times of day for checking blood sugars.  GERD (Goal: minimize symptoms) -Controlled -Current treatment  Omeprazole 40 mg 1 capsule daily -Medications previously tried: tums  -Counseled on avoiding eating late and night and elevating his head in bed. Recommended step down to every other day with controlled symptoms.  Health  Maintenance -Vaccine gaps: tetanus, shingrix, COVID booster -Current therapy:  Latanoprost 0.005% 1 drop in both eyes at bedtime Visine as needed Acetaminophen 500 mg as needed - 2 tablets twice daily  Vitamin D 1000 units daily Hydrocortisone cream 2.5% as needed for site around pacemaker/open heart surgery Vitamin B12 50 mcg  Vitamin E 400 units daily -Educated on Herbal supplement research is limited and benefits usually cannot be proven Cost vs benefit of each product must be carefully weighed by individual consumer Supplements may interfere with prescription drugs -Patient is satisfied with current therapy and denies issues - Consider stopping vitamin E for increased risk of bleeding and no conclusive evidence for heart benefits.  Patient Goals/Self-Care Activities Patient will:  - take medications as prescribed as evidenced by patient report and record review focus on medication adherence by setting an alarm to take medications check glucose a few times a week, document, and provide at future appointments check blood pressure weekly, document, and provide at future appointments target a minimum of 150 minutes of moderate intensity exercise weekly engage in dietary modifications by limiting sweet intake and soda consumption  Follow Up Plan: Telephone follow up appointment with care management team member scheduled for: 4 months       Medication Assistance: None required.  Patient affirms current coverage meets needs.  Compliance/Adherence/Medication fill history: Care Gaps: Shingrix, tetanus, COVID booster (wants to hold off on it) A1c - 6.4% 09/01/21 BP- 128/76 (11/13/21)  Star-Rating Drugs: Empagliflozin (Jardiance) 10 mg - Last filled 09/08/21 [facility] Verified as accurate he has not picked up yet was filled again on 12/07/21 Metformin 500 mg - Last filled 11/24/21 [facility] Rosuvastatin 20 mg - Last filled 11/24/21 [facility] Valsartan HCTZ- 160-12.5 mg - Last  filled 11/03/21 [facility]  [facility]  [id number] [place]Phone: [phone]: [phone] Uses pill box? No - doesn't need Pt endorses 90% compliance - once a week (sometimes both are easy to forget) - getting busy and forgets   We discussed: Benefits of medication synchronization, packaging and delivery as well as enhanced pharmacist oversight with [employer]. Patient decided to: Continue current medication management strategy  Care Plan and Follow Up Patient Decision:  Patient agrees to Care Plan and Follow-up.  Plan: Telephone follow up appointment with care management team member scheduled for:  4 months  MJeni Salles PharmD, [phone]

## 2021-12-17 NOTE — Patient Instructions (Addendum)
Hi Jonathan Brown,  It was great to get to meet you in person! Below is a summary of some of the topics we discussed.   Don't forget to : Move amlodipine to the evening with the second dose of the metoprolol Make sure you are rinsing canned vegetables with water Make sure to get your shingles and tetanus shots at the pharmacy  Please reach out to me if you have any questions or need anything before our follow up!  Best, Maddie  Gaylord Shih, PharmD, [facility] Clinical Pharmacist [facility] [phone]   Visit Information   Goals Addressed             This Visit's Progress    Manage My Medicine       Timeframe:  Long-Range Goal Priority:  Medium Start Date:                             Expected End Date:                       Follow Up Date 04/17/22    - keep a list of all the medicines I take; vitamins and herbals too - learn to read medicine labels - use a pillbox to sort medicine - use an alarm clock or phone to remind me to take my medicine    Why is this important?   These steps will help you keep on track with your medicines.   Notes:        Patient Care Plan: CCM Pharmacy Care Plan     Problem Identified: Problem: Hypertension, Hyperlipidemia, Diabetes, and GERD      Long-Range Goal: Patient-Specific Goal   Start Date: 12/17/2021  Expected End Date: 12/17/2022  This Visit's Progress: On track  Priority: High  Note:   Current Barriers:  Unable to independently monitor therapeutic efficacy Unable to achieve control of triglycerides  Unable to self administer medications as prescribed  Pharmacist Clinical Goal(s):  Patient will achieve adherence to monitoring guidelines and medication adherence to achieve therapeutic efficacy achieve control of triglycerides as evidenced by next lipid panel  through collaboration with PharmD and provider.   Interventions: 1:1 collaboration with Tysinger, Kermit Balo, PA-C regarding development and update of  comprehensive plan of care as evidenced by provider attestation and co-signature Inter-disciplinary care team collaboration (see longitudinal plan of care) Comprehensive medication review performed; medication list updated in electronic medical record  Hypertension (BP goal <130/80) -Not ideally controlled -Current treatment: Amlodipine 10 mg 1 tablet daily Valsartan-HCTZ 160-12.5 mg 1 tablet daily Metoprolol tartrate 50 mg 1 tablet twice daily -Medications previously tried: n/a  -Current home readings:  unsure of readings (wife checks it once a week) - arm cuff, never brought it into office -Current dietary habits: eating a lot of porkchops, a little bit of beef, some chicken (more than beef), fish every Friday; wife does cook with salt; he doesn't add much afterwards; a lot of canned product - uses low sodium; frozen vegetables -Current exercise habits: no formal exercise -Denies hypotensive/hypertensive symptoms -Educated on BP goals and benefits of medications for prevention of heart attack, stroke and kidney damage; Importance of home blood pressure monitoring; Proper BP monitoring technique; Symptoms of hypotension and importance of maintaining adequate hydration; -Counseled to monitor BP at home weekly, document, and provide log at future appointments -Counseled on diet and exercise extensively Recommended to continue current medication  Hyperlipidemia: (LDL goal <  70) -Controlled -Current treatment: Rosuvastatin 20 mg 1 tablet daily Fenofibrate 48 mg 1 tablet daily -Medications previously tried: none  -Current dietary patterns: eating fried foods often and drinking pepsi daily -Current exercise habits: no formal exercise -Educated on Cholesterol goals;  Benefits of statin for ASCVD risk reduction; Importance of limiting foods high in cholesterol; Exercise goal of 150 minutes per week; -Counseled on diet and exercise extensively Recommended to continue current  medication  PVD/Atherosclerosis  (Goal: prevent heart events) -Controlled -Current treatment  Aspirin 81 mg 1 tablet daily Rosuvastatin 20 mg 1 tablet daily -Medications previously tried: none  -Recommended to continue current medication Counseled on benefits of aspirin therapy and avoiding use of other NSAIDs.  Diabetes (A1c goal <7%) -Controlled -Current medications: Jardiance 10 mg 1 tablet daily Metformin 500 mg 1 tablet twice daily -Medications previously tried: n/a  -Current home glucose readings fasting glucose: 142 - wife has been checking once a week - checking in the mornings (at least once a week) - checking couple time a week post prandial glucose: n/a -Denies hypoglycemic/hyperglycemic symptoms -Current meal patterns:  breakfast: eggs, grits, toast and wife drinks orange juice, black coffee  lunch: eating out almost every day - hamburger and french fries yesterday  dinner: green beans, green peas, porkchops snacks: little debbie's cookies drinks: regular pepsi and water (more pepsi) -Current exercise: no formal exercise -Educated on A1c and blood sugar goals; Exercise goal of 150 minutes per week; Benefits of routine self-monitoring of blood sugar; Carbohydrate counting and/or plate method -Counseled to check feet daily and get yearly eye exams -Counseled on diet and exercise extensively Recommended to continue current medication Recommended drinking Pepsi every other day and adding in more water. Recommended alternating times of day for checking blood sugars.  GERD (Goal: minimize symptoms) -Controlled -Current treatment  Omeprazole 40 mg 1 capsule daily -Medications previously tried: tums  -Counseled on avoiding eating late and night and elevating his head in bed. Recommended step down to every other day with controlled symptoms.  Health Maintenance -Vaccine gaps: tetanus, shingrix, COVID booster -Current therapy:  Latanoprost 0.005% 1 drop in both eyes  at bedtime Visine as needed Acetaminophen 500 mg as needed - 2 tablets twice daily  Vitamin D 1000 units daily Hydrocortisone cream 2.5% as needed for site around pacemaker/open heart surgery Vitamin B12 50 mcg  Vitamin E 400 units daily -Educated on Herbal supplement research is limited and benefits usually cannot be proven Cost vs benefit of each product must be carefully weighed by individual consumer Supplements may interfere with prescription drugs -Patient is satisfied with current therapy and denies issues - Consider stopping vitamin E for increased risk of bleeding and no conclusive evidence for heart benefits.  Patient Goals/Self-Care Activities Patient will:  - take medications as prescribed as evidenced by patient report and record review focus on medication adherence by setting an alarm to take medications check glucose a few times a week, document, and provide at future appointments check blood pressure weekly, document, and provide at future appointments target a minimum of 150 minutes of moderate intensity exercise weekly engage in dietary modifications by limiting sweet intake and soda consumption  Follow Up Plan: Telephone follow up appointment with care management team member scheduled for: 4 months      Mr. Heckmann was given information about Chronic Care Management services today including:  CCM service includes personalized support from designated clinical staff supervised by his physician, including individualized plan of care and coordination with other care  providers 24/7 contact phone numbers for assistance for urgent and routine care needs. Standard insurance, coinsurance, copays and deductibles apply for chronic care management only during months in which we provide at least 20 minutes of these services. Most insurances cover these services at 100%, however patients may be responsible for any copay, coinsurance and/or deductible if applicable. This service may  help you avoid the need for more expensive face-to-face services. Only one practitioner may furnish and bill the service in a calendar month. The patient may stop CCM services at any time (effective at the end of the month) by phone call to the office staff.  Patient agreed to services and verbal consent obtained.   The patient verbalized understanding of instructions, educational materials, and care plan provided today and agreed to receive a mailed copy of patient instructions, educational materials, and care plan.  Telephone follow up appointment with pharmacy team member scheduled for: 4 months  Verner Chol, [facility]

## 2021-12-17 NOTE — Telephone Encounter (Signed)
Jonathan Brown - I reviewed pharmacist consult notes from his recent pharmacy consult visit.  Have him stop vitamin E supplement.  I don't know when or why this was added unless it was something he was already taking OTC.

## 2021-12-17 NOTE — Telephone Encounter (Signed)
Pt was notified.  

## 2021-12-26 DIAGNOSIS — E118 Type 2 diabetes mellitus with unspecified complications: Secondary | ICD-10-CM

## 2021-12-26 DIAGNOSIS — I10 Essential (primary) hypertension: Secondary | ICD-10-CM

## 2022-01-11 ENCOUNTER — Other Ambulatory Visit: Payer: Self-pay | Admitting: Medical

## 2022-01-21 ENCOUNTER — Ambulatory Visit: Payer: Medicare HMO | Admitting: Medical

## 2022-01-21 DIAGNOSIS — H401112 Primary open-angle glaucoma, right eye, moderate stage: Secondary | ICD-10-CM | POA: Diagnosis not present

## 2022-01-21 DIAGNOSIS — H401123 Primary open-angle glaucoma, left eye, severe stage: Secondary | ICD-10-CM | POA: Diagnosis not present

## 2022-01-27 ENCOUNTER — Other Ambulatory Visit: Payer: Self-pay

## 2022-01-27 ENCOUNTER — Ambulatory Visit (INDEPENDENT_AMBULATORY_CARE_PROVIDER_SITE_OTHER): Payer: No Typology Code available for payment source | Admitting: Medical

## 2022-01-27 VITALS — BP 120/70 | HR 65 | Wt 194.8 lb

## 2022-01-27 DIAGNOSIS — Z1211 Encounter for screening for malignant neoplasm of colon: Secondary | ICD-10-CM | POA: Insufficient documentation

## 2022-01-27 DIAGNOSIS — I700 Atherosclerosis of aorta: Secondary | ICD-10-CM

## 2022-01-27 DIAGNOSIS — I10 Essential (primary) hypertension: Secondary | ICD-10-CM

## 2022-01-27 DIAGNOSIS — E1169 Type 2 diabetes mellitus with other specified complication: Secondary | ICD-10-CM | POA: Diagnosis not present

## 2022-01-27 DIAGNOSIS — Z952 Presence of prosthetic heart valve: Secondary | ICD-10-CM | POA: Diagnosis not present

## 2022-01-27 DIAGNOSIS — D649 Anemia, unspecified: Secondary | ICD-10-CM

## 2022-01-27 DIAGNOSIS — E1141 Type 2 diabetes mellitus with diabetic mononeuropathy: Secondary | ICD-10-CM

## 2022-01-27 DIAGNOSIS — E785 Hyperlipidemia, unspecified: Secondary | ICD-10-CM

## 2022-01-27 DIAGNOSIS — L602 Onychogryphosis: Secondary | ICD-10-CM

## 2022-01-27 DIAGNOSIS — Z950 Presence of cardiac pacemaker: Secondary | ICD-10-CM

## 2022-01-27 DIAGNOSIS — R413 Other amnesia: Secondary | ICD-10-CM | POA: Diagnosis not present

## 2022-01-27 DIAGNOSIS — I739 Peripheral vascular disease, unspecified: Secondary | ICD-10-CM

## 2022-01-27 DIAGNOSIS — E118 Type 2 diabetes mellitus with unspecified complications: Secondary | ICD-10-CM

## 2022-01-27 DIAGNOSIS — E611 Iron deficiency: Secondary | ICD-10-CM

## 2022-01-27 LAB — POCT GLYCOSYLATED HEMOGLOBIN (HGB A1C): Hemoglobin A1C: 6.6 % — AB (ref 4.0–5.6)

## 2022-01-27 NOTE — Patient Instructions (Signed)
Recommendations:  Check insurance to see if they cover Tetanus booster and Shingles vaccine.    We are checking additional labs today in reference to your memory concern  I recommend updated Cologuard colon cancer screen.  I put in the order, so expect the kit to come to your house.  You did this back in 2020  I recommend a blood flow screen in your legs, ABI.   We order this through another office.  Continue current medications but I plan to increase your fenofibrate medication.   Your last cholesterol numbers were not to goal.

## 2022-01-27 NOTE — Progress Notes (Signed)
Subjective:  Jonathan Brown is a 71 y.o. male who presents for Chief Complaint  Patient presents with   follow up on diabetes    No other concerns     Here for med check.  Medical team: Dr. Jethro Bolus, ophthalmology Dr. Yates Decamp, cardiology Dr. 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  He endorses that he is compliant with all of his medications.  He does not have his medications with him today.  His wife helps him with his medications and checks his blood sugars.  Diabetes-he notes recent blood sugars have been in the normal range.  He denies any foot issues, no polyuria, no polydipsia  He has been having some eye problems and saw his eye doctor last week.  He does have a history of cataracts and glaucoma.  He is using his drops.  He has follow-up with eye doctor soon  Exercise - been helping niece move, been doing some work around sisters house, helping her clean up.    Hypertension-compliant with medication  Hyperlipidemia -compliant with medication  He does have a concern today about some mild memory changes.  He sometimes forgets things.  He denies losing his way or leaving the stove on.  He feels like this is a mild problem.  This been happening over the last year or last 6 months  No other aggravating or relieving factors.    No other c/o.  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     amLODipine (NORVASC) 10 MG tablet TAKE 1 TABLET BY MOUTH EVERY DAY (Patient taking differently: Take 10 mg by mouth daily.) 90 tablet 3   aspirin EC 81 MG tablet Take 1 tablet (81 mg total) by mouth daily. 90 tablet 3   cholecalciferol (VITAMIN D3) 25 MCG (1000 UNIT) tablet Take 1 tablet (1,000 Units total) by mouth daily. 90 tablet 3   fenofibrate (TRICOR) 48 MG tablet Take 1 tablet (48 mg total) by mouth daily. 90 tablet 3   glucose blood test strip 100 each by Other route daily. Use as instructed   Dispense 100 box   12 refills 100 each 12   hydrocortisone 2.5 % cream Apply topically 2 (two) times daily. 30 g 1   JARDIANCE 10 MG TABS tablet TAKE 1 TABLET  BY MOUTH EVERY DAY BEFORE BREAKFAST 90 tablet 0   Lancets (ONETOUCH ULTRASOFT) lancets Use as instructed 100 each 12   latanoprost (XALATAN) 0.005 % ophthalmic solution Place 1 drop into both eyes at bedtime.     metFORMIN (GLUCOPHAGE) 500 MG tablet TAKE ONE TABLET BY MOUTH TWICE DAILY WITH  MEAL (Patient taking differently: Take 500 mg by mouth 2 (two) times daily with a meal.) 180 tablet 3   metoprolol tartrate (LOPRESSOR) 50 MG tablet Take 1 tablet (50 mg total) by mouth 2 (two) times daily. 180 tablet 3   omeprazole (PRILOSEC) 40 MG capsule TAKE 1 CAPSULE BY MOUTH EVERY DAY 90 capsule 0   rosuvastatin (CRESTOR) 20 MG tablet TAKE 1 TABLET BY MOUTH EVERYDAY AT BEDTIME (Patient taking differently: Take 20 mg by mouth at  bedtime.) 90 tablet 3   Tetrahydrozoline HCl (VISINE OP) Apply 1 drop to eye daily as needed (dry eyes).     valsartan-hydrochlorothiazide (DIOVAN-HCT) 160-12.5 MG tablet TAKE 1 TABLET BY MOUTH EVERY DAY IN THE MORNING (Patient taking differently: Take 1 tablet by mouth daily.) 90 tablet 3   vitamin B-12 (CYANOCOBALAMIN) 50 MCG tablet Take 50 mcg by mouth daily.     Vitamin E 100 units TABS Take 1 tablet by mouth daily.     No current facility-administered medications on file prior to visit.     The following portions of the patient's history were reviewed and updated as appropriate: allergies, current medications, past family history, past medical history, past social history, past surgical history and problem list.  ROS Otherwise as in subjective above  Objective: BP 120/70    Pulse 65    Wt 194 lb 12.8 oz (88.4 kg)    SpO2 98%    BMI 29.62 kg/m   General appearance: alert, no distress, well developed, well nourished neck: supple, no lymphadenopathy, no thyromegaly, no masses Heart: mid systolic harsh 2/6 murmur, otherwise  RRR, normal S1, S2 Lungs: CTA bilaterally, no wheezes, rhonchi, or rales Pulses: 2+ radial pulses, 1+ pedal pulses, normal cap refill Dry skin and color changes to darker brown of anterior bilat lower legs suggestive of chronic venous insufficiency Ext: no edema  Diabetic Foot Exam - Simple   Simple Foot Form Diabetic Foot exam was performed with the following findings: Yes 01/27/2022 10:52 AM  Visual Inspection See comments: Yes Sensation Testing Intact to touch and monofilament testing bilaterally: Yes Pulse Check See comments: Yes Comments 1+ pedal pulses, thickened toenails throughout       Assessment: Encounter Diagnoses  Name Primary?   Type 2 diabetes mellitus with complication, without long-term current use of insulin (HCC) Yes   PVD (peripheral vascular disease) (HCC)    S/P AVR    Pacemaker Medtronic Azure XT DR MRI [id number] Dual chamber  pacemaker in situ    Nail hypertrophy    Hyperlipidemia associated with type 2 diabetes mellitus (HCC)    Anemia, unspecified type    Aortic atherosclerosis (HCC)    Diabetic mononeuropathy associated with type 2 diabetes mellitus (HCC)    Essential hypertension    Memory change    Screen for colon cancer      Plan: Diabetes - hgba1C at goal.  Continue current medications, daily foot checks.   Hx/o aortic atherosclerosis, s/p AVR, s/p pacemaker, PVD - continue routine follow up with cardiology  Nail hypertrophy - managed by podiatry  Hyperlipidemia - continue current therapy  Hypertension - continue current therapy  Vaccine counseling - advised he or  wife call insurance to check to see if Td and Shingrix are covered as he is due.   Memory change - additional labs today, MMSE today 25/29, consider neurology consult  Screening for colon cancer - referral for cologuard  Anemia - mild, likely due to chronic disease.  Updated cologaurd, updated labs as below.  Bra[place]en was seen today for follow up on diabetes.  Diagnoses and all orders for this visit:  Type 2 diabetes mellitus with complication, without long-term current use of insulin (HCC) -     HgB A1c -     Comprehensive metabolic panel  PVD (peripheral vascular disease) (HCC)  S/P AVR  Pacemaker Medtronic Azure XT DR MRI [id number] Dual chamber pacemaker in situ  Nail hypertrophy  Hyperlipidemia associated with type 2 diabetes mellitus (HCC)  Anemia, unspecified type -     Iron, TIBC and Ferritin Panel  Aortic atherosclerosis (HCC)  Diabetic mononeuropathy associated with type 2 diabetes mellitus (HCC)  Essential hypertension  Memory change -     HIV Antibody (routine testing w rflx) -     RPR -     Iron, TIBC and Ferritin Panel -     Comprehensive metabolic panel  Screen for colon cancer -     Cologuard    Follow up: pending labs

## 2022-01-28 ENCOUNTER — Other Ambulatory Visit: Payer: Self-pay

## 2022-01-28 ENCOUNTER — Other Ambulatory Visit: Payer: Self-pay | Admitting: Medical

## 2022-01-28 DIAGNOSIS — D649 Anemia, unspecified: Secondary | ICD-10-CM

## 2022-01-28 LAB — COMPREHENSIVE METABOLIC PANEL
ALT: 14 IU/L (ref 0–44)
AST: 18 IU/L (ref 0–40)
Albumin/Globulin Ratio: 1.8 (ref 1.2–2.2)
Albumin: 4.4 g/dL (ref 3.8–4.8)
Alkaline Phosphatase: 79 IU/L (ref 44–121)
BUN/Creatinine Ratio: 14 (ref 10–24)
BUN: 18 mg/dL (ref 8–27)
Bilirubin Total: 0.3 mg/dL (ref 0.0–1.2)
CO2: 20 mmol/L (ref 20–29)
Calcium: 10.3 mg/dL — ABNORMAL HIGH (ref 8.6–10.2)
Chloride: 107 mmol/L — ABNORMAL HIGH (ref 96–106)
Creatinine, Ser: 1.31 mg/dL — ABNORMAL HIGH (ref 0.76–1.27)
Globulin, Total: 2.5 g/dL (ref 1.5–4.5)
Glucose: 173 mg/dL — ABNORMAL HIGH (ref 70–99)
Potassium: 4.3 mmol/L (ref 3.5–5.2)
Sodium: 141 mmol/L (ref 134–144)
Total Protein: 6.9 g/dL (ref 6.0–8.5)
eGFR: 59 mL/min/{1.73_m2} — ABNORMAL LOW (ref >59)

## 2022-01-28 LAB — CBC
Hematocrit: 37.1 % — ABNORMAL LOW (ref 37.5–51.0)
Hemoglobin: 11.1 g/dL — ABNORMAL LOW (ref 13.0–17.7)
MCH: 24 pg — ABNORMAL LOW (ref 26.6–33.0)
MCHC: 29.9 g/dL — ABNORMAL LOW (ref 31.5–35.7)
MCV: 80 fL (ref 79–97)
Platelets: 229 10*3/uL (ref 150–450)
RBC: 4.62 x10E6/uL (ref 4.14–5.80)
RDW: 15.1 % (ref 11.6–15.4)
WBC: 5.7 10*3/uL (ref 3.4–10.8)

## 2022-01-28 LAB — IRON,TIBC AND FERRITIN PANEL
Ferritin: 21 ng/mL — ABNORMAL LOW (ref 30–400)
Iron Saturation: 9 % — CL (ref 15–55)
Iron: 39 ug/dL (ref 38–169)
Total Iron Binding Capacity: 425 ug/dL (ref 250–450)
UIBC: 386 ug/dL — ABNORMAL HIGH (ref 111–343)

## 2022-01-28 LAB — RPR: RPR Ser Ql: NONREACTIVE

## 2022-01-28 LAB — HIV ANTIBODY (ROUTINE TESTING W REFLEX): HIV Screen 4th Generation wRfx: NONREACTIVE

## 2022-01-28 MED ORDER — FERROUS GLUCONATE 324 (38 FE) MG PO TABS: 324.0000 mg | ORAL_TABLET | Freq: Two times a day (BID) | ORAL | 3 refills | Status: DC

## 2022-01-29 ENCOUNTER — Telehealth: Payer: Self-pay | Admitting: Medical

## 2022-01-29 ENCOUNTER — Other Ambulatory Visit: Payer: Self-pay | Admitting: Internal Medicine

## 2022-01-29 DIAGNOSIS — D649 Anemia, unspecified: Secondary | ICD-10-CM

## 2022-01-29 NOTE — Telephone Encounter (Signed)
Spoke to wife about this

## 2022-01-29 NOTE — Addendum Note (Signed)
Addended by: Herminio Commons A on: 01/29/2022 02:06 PM   Modules accepted: Orders

## 2022-01-29 NOTE — Telephone Encounter (Signed)
Please call , she is confused about "kit" he picked up and colonoscopy

## 2022-01-29 NOTE — Addendum Note (Signed)
Addended by: Herminio Commons A on: 01/29/2022 10:18 AM   Modules accepted: Orders

## 2022-02-02 DIAGNOSIS — E611 Iron deficiency: Secondary | ICD-10-CM | POA: Diagnosis not present

## 2022-02-04 ENCOUNTER — Telehealth: Payer: Self-pay | Admitting: Pharmacist

## 2022-02-04 NOTE — Progress Notes (Signed)
A user error has taken place: encounter opened in error, closed for administrative reasons.

## 2022-02-04 NOTE — Chronic Care Management (AMB) (Signed)
Chronic Care Management Pharmacy Assistant   Name: Jonathan Brown  MRN: [id number] DOB: 10-27-1951  Reason for Encounter: Disease State   Conditions to be addressed/monitored: HTN and DMII  Recent office visits:  01/27/22 Jac Canavan, PA-C - Patient presented for Type 2 diabetes mellitus with complication without long  term current use of insulin and other concerns.  Recent consult visits:  01/21/22 Christia Reading, MD (Ophthalmology)- Patient presented for Glaucoma Evaluation. No medication changes.  Hospital visits:  None in previous 6 months  Medications: Outpatient Encounter Medications as of 02/04/2022  Medication Sig   acetaminophen (TYLENOL) 500 MG tablet Take 1,000 mg by mouth in the morning and at bedtime.   amLODipine (NORVASC) 10 MG tablet TAKE 1 TABLET BY MOUTH EVERY DAY (Patient taking differently: Take 10 mg by mouth daily.)   aspirin EC 81 MG tablet Take 1 tablet (81 mg total) by mouth daily.   cholecalciferol (VITAMIN D3) 25 MCG (1000 UNIT) tablet Take 1 tablet (1,000 Units total) by mouth daily.   fenofibrate (TRICOR) 48 MG tablet Take 1 tablet (48 mg total) by mouth daily.   ferrous gluconate (FERGON) 324 MG tablet Take 1 tablet (324 mg total) by mouth 2 (two) times daily with a meal.   glucose blood test strip 100 each by Other route daily. Use as instructed   Dispense 100 box   12 refills   hydrocortisone 2.5 % cream Apply topically 2 (two) times daily.   JARDIANCE 10 MG TABS tablet TAKE 1 TABLET BY MOUTH EVERY DAY BEFORE BREAKFAST   Lancets (ONETOUCH ULTRASOFT) lancets Use as instructed   latanoprost (XALATAN) 0.005 % ophthalmic solution Place 1 drop into both eyes at bedtime.   metFORMIN (GLUCOPHAGE) 500 MG tablet TAKE ONE TABLET BY MOUTH TWICE DAILY WITH  MEAL (Patient taking differently: Take 500 mg by mouth 2 (two) times daily with a meal.)   metoprolol tartrate (LOPRESSOR) 50 MG tablet Take 1 tablet (50 mg total) by mouth 2 (two) times daily.    omeprazole (PRILOSEC) 40 MG capsule TAKE 1 CAPSULE BY MOUTH EVERY DAY   rosuvastatin (CRESTOR) 20 MG tablet TAKE 1 TABLET BY MOUTH EVERYDAY AT BEDTIME (Patient taking differently: Take 20 mg by mouth at bedtime.)   Tetrahydrozoline HCl (VISINE OP) Apply 1 drop to eye daily as needed (dry eyes).   valsartan-hydrochlorothiazide (DIOVAN-HCT) 160-12.5 MG tablet TAKE 1 TABLET BY MOUTH EVERY DAY IN THE MORNING (Patient taking differently: Take 1 tablet by mouth daily.)   vitamin B-12 (CYANOCOBALAMIN) 50 MCG tablet Take 50 mcg by mouth daily.   Vitamin E 100 units TABS Take 1 tablet by mouth daily.   No facility-administered encounter medications on file as of 02/04/2022.  Reviewed chart prior to disease state call. Spoke with patient regarding BP  Recent Office Vitals: BP Readings from Last 3 Encounters:  01/27/22 120/70  11/13/21 128/76  10/26/21 115/69   Pulse Readings from Last 3 Encounters:  01/27/22 65  11/13/21 66  10/26/21 76    Wt Readings from Last 3 Encounters:  01/27/22 194 lb 12.8 oz (88.4 kg)  11/13/21 192 lb 12.8 oz (87.5 kg)  10/26/21 187 lb (84.8 kg)     Kidney Function Lab Results  Component Value Date/Time   CREATININE 1.31 (H) 01/27/2022 11:23 AM   CREATININE 1.30 (H) 09/28/2021 06:04 AM   CREATININE 1.07 03/14/2017 11:02 AM   CREATININE 1.05 06/23/2016 03:50 PM   GFRNONAA >60 09/28/2021 05:49 AM   GFRAA 63  12/11/2020 10:08 AM    BMP Latest Ref Rng & Units 01/27/2022 09/28/2021 09/28/2021  Glucose 70 - 99 mg/dL 962(I) 297(L) 892(J)  BUN 8 - 27 mg/dL 18 19 17   Creatinine 0.76 - 1.27 mg/dL ) 1.94(R) 7.40(C)  BUN/Creat Ratio 10 - 24 14 - -  Sodium 134 - 144 mmol/L 141 139 137  Potassium 3.5 - 5.2 mmol/L 4.3 3.4(L) 3.3(L)  Chloride 96 - 106 mmol/L 107(H) 103 104  CO2 20 - 29 mmol/L 20 - 22  Calcium 8.6 - 10.2 mg/dL 10.3(H) - 9.8    Current antihypertensive regimen:  Amlodipine 10 mg 1 tablet daily Valsartan-HCTZ 160-12.5 mg 1 tablet daily Metoprolol  tartrate 50 mg 1 tablet twice daily How often are you checking your Blood Pressure? infrequently Current home BP readings:  BP Readings from Last 3 Encounters:  01/27/22 120/70  11/13/21 128/76  10/26/21 115/69  Wife reports they are not checking at home currently do not have a cuff to do so. She states he has no complaints of hyper/hypotensive symptoms. What recent interventions/DTPs have been made by any provider to improve Blood Pressure control since last CPP Visit: Wife reports no changes Any recent hospitalizations or ED visits since last visit with CPP? No  Adherence Review: Is the patient currently on ACE/ARB medication? Yes Does the patient have >5 day gap between last estimated fill dates? No  Recent Relevant Labs: Lab Results  Component Value Date/Time   HGBA1C 6.6 (A) 01/27/2022 11:14 AM   HGBA1C 6.4 (H) 09/01/2021 11:53 AM   HGBA1C 6.1 (A) 12/12/2020 11:26 AM   HGBA1C 5.8 (H) 09/09/2020 01:14 PM   MICROALBUR 0.2 03/14/2017 11:02 AM   MICROALBUR 1.8 02/03/2016 12:01 AM    Kidney Function Lab Results  Component Value Date/Time   CREATININE 1.31 (H) 01/27/2022 11:23 AM   CREATININE 1.30 (H) 09/28/2021 06:04 AM   CREATININE 1.07 03/14/2017 11:02 AM   CREATININE 1.05 06/23/2016 03:50 PM   GFRNONAA >60 09/28/2021 05:49 AM   GFRAA 63 12/11/2020 10:08 AM    Current antihyperglycemic regimen:  Jardiance 10 mg 1 tablet daily Metformin 500 mg 1 tablet twice daily What recent interventions/DTPs have been made to improve glycemic control:  Wife reports none Have there been any recent hospitalizations or ED visits since last visit with CPP? No Patient denies hypoglycemic symptoms, including None Patient denies hyperglycemic symptoms, including none How often are you checking your blood sugar? once daily and before meals What are your blood sugars ranging?  Fasting: 120; 135 During the week, how often does your blood glucose drop below 70? Never   Adherence Review: Is  the patient currently on a STATIN medication? Yes Is the patient currently on ACE/ARB medication? Yes Does the patient have >5 day gap between last estimated fill dates? No    Care Gaps: BP- 120/70 (01/27/22) AWV- 11/22 CCM- 4/23 TDAP - Overdue Zoster Vaccine - Overdue COVID Booster  - Overdue  Star Rating Drugs: Valsartan HCTZ - Last filled 11/03/21 [facility] not > 5 days did not verify Empagliflozin (Jardiance) 10 mg - Last filled 12/07/21 [facility]  Metformin 500 mg - Last filled 11/24/21 [facility] Rosuvastatin 20 mg - Last filled 11/24/21 [facility] at [facility]   11/26/21 CMA Clinical Pharmacist Assistant [phone]

## 2022-02-05 LAB — FECAL OCCULT BLOOD, IMMUNOCHEMICAL: Fecal Occult Bld: NEGATIVE

## 2022-02-11 DIAGNOSIS — Z45018 Encounter for adjustment and management of other part of cardiac pacemaker: Secondary | ICD-10-CM | POA: Diagnosis not present

## 2022-02-11 DIAGNOSIS — I442 Atrioventricular block, complete: Secondary | ICD-10-CM | POA: Diagnosis not present

## 2022-02-12 NOTE — Progress Notes (Signed)
Per MP rescheduled appointment for 4/20 as she will not be in office, spoke to wife and offered 4/27 she accepted

## 2022-03-20 ENCOUNTER — Other Ambulatory Visit: Payer: Self-pay | Admitting: Medical

## 2022-04-11 ENCOUNTER — Other Ambulatory Visit: Payer: Self-pay | Admitting: Medical

## 2022-04-15 ENCOUNTER — Telehealth: Payer: Medicare HMO

## 2022-04-19 ENCOUNTER — Ambulatory Visit (INDEPENDENT_AMBULATORY_CARE_PROVIDER_SITE_OTHER): Payer: No Typology Code available for payment source | Admitting: Podiatry

## 2022-04-19 ENCOUNTER — Encounter: Payer: Self-pay | Admitting: Podiatry

## 2022-04-19 DIAGNOSIS — B351 Tinea unguium: Secondary | ICD-10-CM | POA: Diagnosis not present

## 2022-04-19 DIAGNOSIS — E1141 Type 2 diabetes mellitus with diabetic mononeuropathy: Secondary | ICD-10-CM

## 2022-04-19 DIAGNOSIS — M79676 Pain in unspecified toe(s): Secondary | ICD-10-CM

## 2022-04-19 DIAGNOSIS — M79609 Pain in unspecified limb: Secondary | ICD-10-CM | POA: Diagnosis not present

## 2022-04-19 DIAGNOSIS — I739 Peripheral vascular disease, unspecified: Secondary | ICD-10-CM

## 2022-04-19 NOTE — Progress Notes (Signed)
This patient returns to my office for at risk foot care.  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  This patient is unable to cut nails himself since the patient cannot reach his nails.These nails are painful walking and wearing shoes.  This patient presents for at risk foot care today.  General Appearance  Alert, conversant and in no acute stress.  Vascular  Dorsalis pedis and posterior tibial  pulses are palpable  bilaterally.  Capillary return is within normal limits  bilaterally. Temperature is within normal limits  bilaterally.  Neurologic  Senn-Weinstein monofilament wire test within normal limits  bilaterally. Muscle power within normal limits bilaterally.  Nails Thick disfigured discolored nails with subungual debris  from hallux to fifth toes bilaterally. No evidence of bacterial infection or drainage bilaterally.  Orthopedic  No limitations of motion  feet .  No crepitus or effusions noted.  No bony pathology or digital deformities noted.  Skin  normotropic skin with no porokeratosis noted bilaterally.  No signs of infections or ulcers noted.     Onychomycosis  Pain in right toes  Pain in left toes  Consent was obtained for treatment procedures.   Mechanical debridement of nails 1-5  bilaterally performed with a nail nipper.  Filed with dremel without incident.    Return office visit   4  months                   Told patient to return for periodic foot care and evaluation due to potential at risk complications.   Lamees Gable DPM  

## 2022-04-21 ENCOUNTER — Telehealth: Payer: Self-pay | Admitting: Pharmacist

## 2022-04-21 NOTE — Chronic Care Management (AMB) (Signed)
? ? ?  Chronic Care Management ?Pharmacy Assistant  ? ?Name: Jonathan Brown  MRN: [id number] DOB: Nov 15, 1951 ? ?04/21/22 APPOINTMENT REMINDER ? ? ? ?Patients wife was reminded to have all medications, supplements and any blood glucose and blood pressure readings available for review with Gaylord Shih, Pharm. D, for office visit on 04/22/22 at 9:30. ? ?Wife reports they do not have a bp machine but will bring glucometer as she thinks he is needing a new one. ? ?Care Gaps: ?TDAP - Overdue ?Zoster Vaccine - Overdue ?COVID Booster - Overdue ?Eye Exam - Overdue ?Bp- 120/70 ( 01/27/22) ?Lab Results  ?Component Value Date  ? HGBA1C 6.6 (A) 01/27/2022  ? ? ?Star Rating Drug: ?Valsartan HCTZ - [facility]ard[facility] 10 mg - [facility]/23/22 [facility]  ?Metformin 500 mg - Last filled 02/28/22 [facility] ?Rosuvastatin 20 mg - Last filled 02/28/22 [facility] at [facility] ? ?Any gaps in medications fill history? ? ? ?none ? ? ?Medications: ?Outpatient Encounter Medications as of 04/21/2022  ?Medication Sig  ? acetaminophen (TYLENOL) 500 MG tablet Take 1,000 mg by mouth in the morning and at bedtime.  ? amLODipine (NORVASC) 10 MG tablet TAKE 1 TABLET BY MOUTH EVERY DAY (Patient taking differently: Take 10 mg by mouth daily.)  ? aspirin EC 81 MG tablet Take 1 tablet (81 mg total) by mouth daily.  ? cholecalciferol (VITAMIN D3) 25 MCG (1000 UNIT) tablet Take 1 tablet (1,000 Units total) by mouth daily.  ? fenofibrate (TRICOR) 48 MG tablet Take 1 tablet (48 mg total) by mouth daily.  ? ferrous gluconate (FERGON) 324 MG tablet Take 1 tablet (324 mg total) by mouth 2 (two) times daily with a meal.  ? glucose blood test strip 100 each by Other route daily. Use as instructed   Dispense 100 box   12 refills  ? hydrocortisone 2.5 % cream Apply topically 2 (two) times daily.  ? JARDIANCE 10 MG TABS tablet TAKE 1 TABLET BY MOUTH EVERY DAY BEFORE BREAKFAST  ? Lancets (ONETOUCH ULTRASOFT) lancets Use as instructed  ?  latanoprost (XALATAN) 0.005 % ophthalmic solution Place 1 drop into both eyes at bedtime.  ? metFORMIN (GLUCOPHAGE) 500 MG tablet TAKE ONE TABLET BY MOUTH TWICE DAILY WITH  MEAL (Patient taking differently: Take 500 mg by mouth 2 (two) times daily with a meal.)  ? metoprolol tartrate (LOPRESSOR) 50 MG tablet Take 1 tablet (50 mg total) by mouth 2 (two) times daily.  ? omeprazole (PRILOSEC) 40 MG capsule TAKE 1 CAPSULE BY MOUTH EVERY DAY  ? rosuvastatin (CRESTOR) 20 MG tablet TAKE 1 TABLET BY MOUTH EVERYDAY AT BEDTIME (Patient taking differently: Take 20 mg by mouth at bedtime.)  ? Tetrahydrozoline HCl (VISINE OP) Apply 1 drop to eye daily as needed (dry eyes).  ? valsartan-hydrochlorothiazide (DIOVAN-HCT) 160-12.5 MG tablet TAKE 1 TABLET BY MOUTH EVERY DAY IN THE MORNING (Patient taking differently: Take 1 tablet by mouth daily.)  ? vitamin B-12 (CYANOCOBALAMIN) 50 MCG tablet Take 50 mcg by mouth daily.  ? Vitamin E 100 units TABS Take 1 tablet by mouth daily.  ? ?No facility-administered encounter medications on file as of 04/21/2022.  ? ? ? ?Pamala Duffel CMA ?Clinical Pharmacist Assistant ?[phone] ? ?

## 2022-04-22 ENCOUNTER — Other Ambulatory Visit: Payer: Self-pay

## 2022-04-22 ENCOUNTER — Ambulatory Visit (INDEPENDENT_AMBULATORY_CARE_PROVIDER_SITE_OTHER): Payer: No Typology Code available for payment source | Admitting: Pharmacist

## 2022-04-22 DIAGNOSIS — E118 Type 2 diabetes mellitus with unspecified complications: Secondary | ICD-10-CM

## 2022-04-22 DIAGNOSIS — I10 Essential (primary) hypertension: Secondary | ICD-10-CM

## 2022-04-22 MED ORDER — GLUCOSE BLOOD VI STRP: 100.0000 | ORAL_STRIP | Freq: Every day | 12 refills | Status: DC

## 2022-04-22 NOTE — Patient Instructions (Addendum)
Restart vitamin B12 500 mcg daily ?Start keeping track of a log of your blood pressures and blood sugars at home ?Bring your blood pressure cuff to your next appointment to make sure it's accurate ?Please check your blood sugar at different times of the day - alternating mornings and 1-2 hours after a meal and then before bedtime ?Call [phone] to schedule an appointment for [facility] ? ?Maddie ?Jeni Salles, PharmD, BCACP ?Clinical Pharmacist ?[place] ?[phone] ? ?

## 2022-04-22 NOTE — Progress Notes (Signed)
? ?Chronic Care Management ?Pharmacy Note ? ?04/22/2022 ?Name:  Jonathan Brown MRN:  [id number] DOB:  [birth year] ? ?Summary: ?Triglycerides are above goal < 150 ?Pt thinks his glucometer is broken ?BG in office was 301 ? ?Recommendations/Changes made from today's visit: ?-Recommended restarting vitamin B12 ?-Provided [facility] number to call to set up appt ?-Recommended alternating time of day for checking blood sugars ?-Recommended bringing BP cuff to next office visit to ensure accuracy ?-Recommend fructosamine level instead of A1c to assess DM control ? ?Plan: ?BP and DM assessment in 1-2 months ?Follow up 4-5 months ? ?Subjective: ?Jonathan Brown is an 71 y.o. year old male who is a primary patient of Carlena Hurl, PA-C.  The CCM team was consulted for assistance with disease management and care coordination needs.   ? ?Engaged with patient face to face for follow up visit in response to provider referral for pharmacy case management and/or care coordination services.  ? ?Consent to Services:  ?The patient was given information about Chronic Care Management services, agreed to services, and gave verbal consent prior to initiation of services.  Please see initial visit note for detailed documentation.  ? ?Patient Care Team: ?Tysinger, Leward Quan as PCP - General (Family Medicine) ?Viona Gilmore, [facility] as Pharmacist (Pharmacist) ? ?Recent office visits: ?01/27/22 Carlena Hurl, PA-C - Patient presented for Type 2 diabetes mellitus with complication without long  term current use of insulin and other concerns. Recommended iron twice daily. ? ?11/13/21 Kellie Simmering, LPN - Patient presented for Medicare annual wellness exam.  ? ?Recent consult visits: ?04/19/22 Gardiner Barefoot, DPM (podiatry): Patient presented for nail trim. ? ?01/21/22 Hyman Hopes, MD (Ophthalmology)- Patient presented for Glaucoma Evaluation. No medication changes. ? ?12/16/21 Gardiner Barefoot, DPM (podiatry):  Patient presented for nail trim. ? ?11/26/21 Adrian Prows, MD (Cardiology) - Patient presented for Encounter for care of pacemaker and other concerns. No medication changes. ?  ?10/26/21 Adrian Prows, MD (Cardiology) - Patient presented for Essential hypertension and other concerns. Prescribed Fenofibrate 48 mg daily. ?  ?10/21/21 Hyman Hopes, MD (Ophthalmology) - Patient presented for glaucoma and other concerns. No medication changes. ?  ?09/15/21 Bronson Ing, DPM (Podiatry) - Patient presented for Claudication and other concerns.No medication changes. ?  ?07/16/21 Hyman Hopes, MD (Ophthalmology) - Patient presented for Pseudophakia of left eye and other concerns. No medication changes. ? ? ?Hospital visits: ?Medication Reconciliation was completed by comparing discharge summary, patient?s EMR and Pharmacy list, and upon discussion with patient. ?  ?Patent presented to [facility] ED on 09/28/21 due to Hiccups and Shortness of breath. Patient was present for 8 hours. ?  ?New?Medications Started at [facility] Discharge:?? ?-started  ?chlorproMAZINE 25 MG tablet ?  ?Medication Changes at Hospital Discharge: ?-Changed  ?None ?  ?Medications Discontinued at Hospital Discharge: ?-Stopped  ?None  ?  ?Medications that remain the same after Hospital Discharge:??  ?-All other medications will remain the same.   ?  ?  ?Medication Reconciliation was completed by comparing discharge summary, patient?s EMR and Pharmacy list, and upon discussion with patient. ?  ?Patent presented to [facility] ED on 09/21/21 due to Opiate overdose. Patient was present for 4 hours. ?  ?New?Medications Started at [facility] Discharge:?? ?-started  ?None  ?  ?Medication Changes at Hospital Discharge: ?-Changed  ?None ?  ?Medications Discontinued at Hospital Discharge: ?-Stopped  ?None  ?  ?Medications that remain the same after [facility]  Discharge:??  ?-All other medications will remain the same.   ?   ?  ?Patent presented to [facility] ED on 08/28/21 due to Laceration of right lower extremity. Patient was present for 3 hours. ?  ?New?Medications Started at [facility] Discharge:?? ?-started  ?None  ?  ?Medication Changes at Hospital Discharge: ?-Changed  ?None ?  ?Medications Discontinued at Hospital Discharge: ?-Stopped  ?None  ?  ?Medications that remain the same after Hospital Discharge:??  ?-All other medications will remain the same.   ? ? ?Objective: ? ?Lab Results  ?Component Value Date  ? CREATININE 1.31 (H) 01/27/2022  ? BUN 18 01/27/2022  ? GFRNONAA >60 09/28/2021  ? GFRAA 63 12/11/2020  ? NA 141 01/27/2022  ? K 4.3 01/27/2022  ? CALCIUM 10.3 (H) 01/27/2022  ? CO2 20 01/27/2022  ? GLUCOSE 173 (H) 01/27/2022  ? ? ?Lab Results  ?Component Value Date/Time  ? HGBA1C 6.6 (A) 01/27/2022 11:14 AM  ? HGBA1C 6.4 (H) 09/01/2021 11:53 AM  ? HGBA1C 6.1 (A) 12/12/2020 11:26 AM  ? HGBA1C 5.8 (H) 09/09/2020 01:14 PM  ? MICROALBUR 0.2 03/14/2017 11:02 AM  ? MICROALBUR 1.8 02/03/2016 12:01 AM  ?  ?Last diabetic Eye exam:  ?Lab Results  ?Component Value Date/Time  ? HMDIABEYEEXA No Retinopathy 02/26/2021 12:00 AM  ?  ?Last diabetic Foot exam: No results found for: HMDIABFOOTEX  ? ?Lab Results  ?Component Value Date  ? CHOL 113 10/29/2021  ? HDL 29 (L) 10/29/2021  ? LDLCALC 45 10/29/2021  ? TRIG 247 (H) 10/29/2021  ? CHOLHDL 4.0 09/09/2020  ? ? ? ?  Latest Ref Rng & Units 01/27/2022  ? 11:23 AM 09/28/2021  ?  5:49 AM 09/01/2021  ? 11:53 AM  ?Hepatic Function  ?Total Protein 6.0 - 8.5 g/dL 6.9   6.9   6.7    ?Albumin 3.8 - 4.8 g/dL 4.4   3.8   4.6    ?AST 0 - 40 IU/L _0 ?ALT 0 - 44 IU/L _1 ?Alk Phosphatase 44 - 121 IU/L 79   60   87    ?Total Bilirubin 0.0 - 1.2 mg/dL 0.3   0.9   0.3    ?Bilirubin, Direct 0.00 - 0.40 mg/dL   <0.10    ? ? ?Lab Results  ?Component Value Date/Time  ? TSH 2.350 09/01/2021 11:53 AM  ? TSH 2.16 06/14/2017 08:35 AM  ? ? ? ?  Latest Ref Rng & Units 01/28/2022  ?   3:59 PM 09/28/2021  ?  6:04 AM 09/28/2021  ?  5:49 AM  ?CBC  ?WBC 3.4 - 10.8 x10E3/uL 5.7    7.6    ?Hemoglobin 13.0 - 17.7 g/dL 11.1   12.2   11.7    ?Hematocrit 37.5 - 51.0 % 37.1   36.0   35.8    ?Platelets 150 - 450 x10E3/uL 229    204    ? ? ?Lab Results  ?Component Value Date/Time  ? VD25OH 32.2 09/17/2021 10:58 AM  ? ? ?Clinical ASCVD: Yes  ?The ASCVD Risk score (Arnett DK, et al., 2019) failed to calculate for the following reasons: ?  The valid total cholesterol range is 130 to 320 mg/dL   ? ? ?  01/27/2022  ? 10:18 AM 11/13/2021  ?  8:30 AM 09/01/2021  ? 11:07 AM  ?Depression screen PHQ 2/9  ?Decreased Interest  0 0 0  ?Down, Depressed, Hopeless 0 0 0  ?PHQ - 2 Score 0 0 0  ?  ? ?Social History  ? ?Tobacco Use  ?Smoking Status Former  ? Packs/day: 0.25  ? Years: 30.00  ? Pack years: 7.50  ? Types: Cigarettes  ? Quit date: 09/28/2007  ? Years since quitting: 14.5  ?Smokeless Tobacco Never  ? ?BP Readings from Last 3 Encounters:  ?01/27/22 120/70  ?11/13/21 128/76  ?10/26/21 115/69  ? ?Pulse Readings from Last 3 Encounters:  ?01/27/22 65  ?11/13/21 66  ?10/26/21 76  ? ?Wt Readings from Last 3 Encounters:  ?01/27/22 194 lb 12.8 oz (88.4 kg)  ?11/13/21 192 lb 12.8 oz (87.5 kg)  ?10/26/21 187 lb (84.8 kg)  ? ?BMI Readings from Last 3 Encounters:  ?01/27/22 29.62 kg/m?  ?11/13/21 29.32 kg/m?  ?10/26/21 29.73 kg/m?  ? ? ?Assessment/Interventions: Review of patient past medical history, allergies, medications, health status, including review of consultants reports, laboratory and other test data, was performed as part of comprehensive evaluation and provision of chronic care management services.  ? ?SDOH:  (Social Determinants of Health) assessments and interventions performed: Yes ? ? ?SDOH Screenings  ? ?Alcohol Screen: Not on file  ?Depression (PHQ2-9): Low Risk   ? PHQ-2 Score: 0  ?Financial Resource Strain: Low Risk   ? Difficulty of Paying Living Expenses: Not hard at all  ?Food Insecurity: No Food Insecurity  ?  Worried About Charity fundraiser in the Last Year: Never true  ? Ran Out of Food in the Last Year: Never true  ?Housing: Not on file  ?Physical Activity: Inactive  ? Days of Exercise per Week: 0 days  ?

## 2022-04-25 DIAGNOSIS — E118 Type 2 diabetes mellitus with unspecified complications: Secondary | ICD-10-CM | POA: Diagnosis not present

## 2022-04-25 DIAGNOSIS — I10 Essential (primary) hypertension: Secondary | ICD-10-CM

## 2022-04-28 ENCOUNTER — Other Ambulatory Visit: Payer: Self-pay | Admitting: Medical

## 2022-05-03 ENCOUNTER — Other Ambulatory Visit: Payer: Self-pay | Admitting: Medical

## 2022-05-03 NOTE — Telephone Encounter (Signed)
Was filled for 90 days on 3/27. Pt has an appt 6/1 ?

## 2022-05-06 ENCOUNTER — Encounter: Payer: Self-pay | Admitting: Physician Assistant

## 2022-05-07 ENCOUNTER — Encounter: Payer: Self-pay | Admitting: *Deleted

## 2022-05-07 DIAGNOSIS — Z006 Encounter for examination for normal comparison and control in clinical research program: Secondary | ICD-10-CM

## 2022-05-07 NOTE — Research (Signed)
Message left on voicemail to inform Mr and Mrs Doudna about the [employer] research study. Encouraged to call back.  ?

## 2022-05-12 NOTE — Progress Notes (Signed)
05/13/2022 Jonathan Brown [id number] September 01, 1951  Referring provider: Jac Canavan, PA-C Primary GI doctor: Dr. Tomasa Rand  ASSESSMENT AND PLAN:   Iron deficiency anemia, unspecified iron deficiency anemia type Continue iron supplement No overt bleeding, appears to be chronic, on PPI? Decreased absorption, may benefit from hematology referral/IV iron Iron better, ferritin low, continue iron supplement.  I recommend upper gastrointestinal and colorectal evaluation with an EGD and colonoscopy. The rationale for this was discussed.  Risk of bowel prep, conscious sedation, and EGD and colonoscopy were discussed.  Risks include but are not limited to dehydration, pain, bleeding, cardiopulmonary process, bowel perforation, or other possible adverse outcomes. Treatment plan was discussed with patient, and agreed upon.  Gastroesophageal reflux disease without esophagitis he reports symptoms are currently well controlled, and denies breakthrough reflux, burning in chest, hoarseness or cough. Lifestyle changes discussed, avoid NSAIDS Continue current medications  Diabetic mononeuropathy associated with type 2 diabetes mellitus (HCC) Not on insulin  PVD (peripheral vascular disease) (HCC) Not on blood thinner.   Pacemaker Medtronic Azure XT DR MRI [id number] Dual chamber pacemaker in situ Well controlled  S/P AVR echo 05/2020 EF 50-55%, normal aortic valve function  Patient Care Team: Tysinger, Kermit Balo, PA-C as PCP - General (Family Medicine) Verner Chol, [facility] as Pharmacist (Pharmacist)  HISTORY OF PRESENT ILLNESS: 71 y.o. male with a past medical history of type 2 diabetes, hypertension, hyperlipidemia, complete heart block status post pacemaker, peripheral vascular disease, status post aortic valve replacement 2018 (echo 05/2020 EF 50-55%, normal aortic valve function), GERD, former smoker and others listed below presents for evaluation of anemia.   Patient found to  have normocytic anemia.   EGD 10/2016 at [facility] for melena small hiatal hernia otherwise normal no biopsies. 01/28/2022 hemoglobin 11.1, MCV 80  iron low normal at 39 however ferritin 21 and iron saturation at 9.   TIBC 425.  Has been on iron supplement x 1 year and on B12.  GFR 59, calcium slightly elevated at 10.3 Negative fecal occult blood  02/02/2022  Feels a little sluggish in the morning with fatigue, no SOB, CP, dizziness. He has a lot of exercise and stays busy.  Patient denies GERD, is on prilosec 40 mg once a day.  Patient denies dysphagia, nausea, vomiting, melena.  Patient has a BM 1-2 x a day. No straining can be formed or loose.   Patient denies change in bowel habits, constipation, diarrhea, hematochezia.  Denies changes in appetite, unintentional weight loss.  Patient denies family history of colon cancer or other gastrointestinal malignancies. No NSAIDS, very rare ETOH.   11/10/2016 upper endoscopy At [facility] endoscopy with Dr. Ewing Schlein for melena Small hiatal hernia stomach normal normal duodenum no biopsies 01/06/2007 Colonoscopy with Dr. Ewing Schlein at [facility], unremarkable, recall 10 years.   CBC  01/28/2022  HGB 11.1 MCV 80 with normocytic anemia WBC 5.7 Platelets 229 Anemia panel 01/27/2022  Iron 39 Ferritin 21  09/29/2021  B12 299 Kidney function 01/27/2022  BUN 18 Cr 1.31  GFR >60  Potassium 4.3   LFTs 01/27/2022  AST 18 ALT 14 Alkphos 79 TBili 0.3 09/01/2021 TSH 2.350  Current Medications:   Current Outpatient Medications (Endocrine & Metabolic):    JARDIANCE 10 MG TABS tablet, TAKE 1 TABLET BY MOUTH EVERY DAY BEFORE BREAKFAST   metFORMIN (GLUCOPHAGE) 500 MG tablet, TAKE ONE TABLET BY MOUTH TWICE DAILY WITH  MEAL (Patient taking differently: Take 500 mg by mouth 2 (two) times daily with a meal.)  Current Outpatient Medications (Cardiovascular):    amLODipine (NORVASC) 10 MG tablet, TAKE 1 TABLET BY MOUTH EVERY DAY (Patient taking differently: Take 10 mg by mouth daily.)    fenofibrate (TRICOR) 48 MG tablet, Take 1 tablet (48 mg total) by mouth daily.   metoprolol tartrate (LOPRESSOR) 50 MG tablet, Take 1 tablet (50 mg total) by mouth 2 (two) times daily.   rosuvastatin (CRESTOR) 20 MG tablet, TAKE 1 TABLET BY MOUTH EVERYDAY AT BEDTIME (Patient taking differently: Take 20 mg by mouth at bedtime.)   valsartan-hydrochlorothiazide (DIOVAN-HCT) 160-12.5 MG tablet, TAKE 1 TABLET BY MOUTH EVERY DAY IN THE MORNING (Patient taking differently: Take 1 tablet by mouth daily.)   Current Outpatient Medications (Analgesics):    acetaminophen (TYLENOL) 500 MG tablet, Take 1,000 mg by mouth in the morning and at bedtime.   aspirin EC 81 MG tablet, Take 1 tablet (81 mg total) by mouth daily.  Current Outpatient Medications (Hematological):    ferrous gluconate (FERGON) 324 MG tablet, TAKE 1 TABLET (324 MG TOTAL) BY MOUTH 2 (TWO) TIMES DAILY WITH A MEAL.   vitamin B-12 (CYANOCOBALAMIN) 50 MCG tablet, Take 50 mcg by mouth daily.  Current Outpatient Medications (Other):    cholecalciferol (VITAMIN D3) 25 MCG (1000 UNIT) tablet, Take 1 tablet (1,000 Units total) by mouth daily.   glucose blood test strip, 100 each by Other route daily. Use as instructed   Dispense 100 box   12 refills   Lancets (ONETOUCH ULTRASOFT) lancets, Use as instructed   latanoprost (XALATAN) 0.005 % ophthalmic solution, Place 1 drop into both eyes at bedtime.   omeprazole (PRILOSEC) 40 MG capsule, TAKE 1 CAPSULE BY MOUTH EVERY DAY   Tetrahydrozoline HCl (VISINE OP), Apply 1 drop to eye daily as needed (dry eyes).  Medical History:  Past Medical History:  Diagnosis Date   Anemia    Arthritis    Cataract 02/26/2021   Bilateral   CHB (complete heart block) (HCC) 10/05/2017   Diabetes mellitus without complication (HCC) 2014   type 2    Encounter for care of pacemaker 07/20/2019   Former smoker    GERD (gastroesophageal reflux disease)    History of colon polyps    Hyperlipidemia    Hypertension  2012   Open-angle glaucoma 02/26/2021   Pacemaker Medtronic Azure XT DR MRI [id number] Dual chamber pacemaker in situ 10/03/2017   PVD (peripheral vascular disease) (HCC)    S/P AVR    Aortic valve replacement using a 21-mm pericardial Magna Ease (09/29/2017 by Dr. 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 Family History:  His family history includes Alcohol abuse in his brother; Diabetes in his brother; Heart disease in his father; Heart disease (age of onset: 48) in his brother; Other in his mother; Sleep apnea in his sister. Social History:   reports that he quit smoking about 14 years ago. His smoking use included cigarettes. He has a 7.50 pack-year smoking history. He has never used smokeless tobacco. He reports current alcohol  use. He reports current drug use. Frequency: 7.00 times per week. Drug: Marijuana.  REVIEW OF SYSTEMS  : All other systems reviewed and negative except where noted in the History of Present Illness.  PHYSICAL EXAM: BP 130/80   Pulse 71   Ht 5\' 8"  (1.727 m)   Wt 192 lb (87.1 kg)   SpO2 97%   BMI 29.19 kg/m  General:   Pleasant, well developed male in no acute distress Head:   Normocephalic and atraumatic. Eyes:  sclerae anicteric,conjunctive pink  Heart:   regular rate and rhythm, with systolic murmur Pulm:  Clear anteriorly; no wheezing Abdomen:   Soft, Obese AB, Active bowel sounds. No tenderness . Without guarding and Without  rebound, No organomegaly appreciated. Rectal: Not evaluated Extremities:  Without edema. Msk: Symmetrical without gross deformities. Peripheral pulses intact.  Neurologic:  Alert and  oriented x4;  No focal deficits.  Skin:   Dry and intact without significant lesions or rashes. Psychiatric:  Cooperative. Normal mood and affect.    Doree AlbeeAmanda R Deng Kemler, PA-C 9:46 AM

## 2022-05-13 ENCOUNTER — Ambulatory Visit (INDEPENDENT_AMBULATORY_CARE_PROVIDER_SITE_OTHER): Payer: No Typology Code available for payment source | Admitting: Physician Assistant

## 2022-05-13 ENCOUNTER — Encounter: Payer: Self-pay | Admitting: Physician Assistant

## 2022-05-13 VITALS — BP 130/80 | HR 71 | Ht 68.0 in | Wt 192.0 lb

## 2022-05-13 DIAGNOSIS — D509 Iron deficiency anemia, unspecified: Secondary | ICD-10-CM

## 2022-05-13 DIAGNOSIS — K219 Gastro-esophageal reflux disease without esophagitis: Secondary | ICD-10-CM

## 2022-05-13 DIAGNOSIS — I442 Atrioventricular block, complete: Secondary | ICD-10-CM | POA: Diagnosis not present

## 2022-05-13 DIAGNOSIS — Z950 Presence of cardiac pacemaker: Secondary | ICD-10-CM

## 2022-05-13 DIAGNOSIS — Z45018 Encounter for adjustment and management of other part of cardiac pacemaker: Secondary | ICD-10-CM | POA: Diagnosis not present

## 2022-05-13 DIAGNOSIS — Z952 Presence of prosthetic heart valve: Secondary | ICD-10-CM | POA: Diagnosis not present

## 2022-05-13 DIAGNOSIS — E1141 Type 2 diabetes mellitus with diabetic mononeuropathy: Secondary | ICD-10-CM

## 2022-05-13 DIAGNOSIS — I739 Peripheral vascular disease, unspecified: Secondary | ICD-10-CM

## 2022-05-13 MED ORDER — SUTAB 1479-225-188 MG PO TABS: ORAL_TABLET | ORAL | 0 refills | Status: DC

## 2022-05-13 NOTE — Patient Instructions (Addendum)
If you are age 71 or older, your body mass index should be between 23-30. Your Body mass index is 29.19 kg/m. If this is out of the aforementioned range listed, please consider follow up with your Primary Care Provider.  If you are age 29 or younger, your body mass index should be between 19-25. Your Body mass index is 29.19 kg/m. If this is out of the aformentioned range listed, please consider follow up with your Primary Care Provider.   ________________________________________________________  The [facility] providers would like to encourage you to use [facility] to communicate with providers for non-urgent requests or questions.  Due to long hold times on the telephone, sending your provider a message by [facility] may be a faster and more efficient way to get a response.  Please allow 48 business hours for a response.  Please remember that this is for non-urgent requests.  _______________________________________________________  [name] have been scheduled for an endoscopy and colonoscopy. Please follow the written instructions given to you at your visit today. Please pick up your prep supplies at the pharmacy within the next 1-3 days. If you use inhalers (even only as needed), please bring them with you on the day of your procedure.  Due to recent changes in healthcare laws, you may see the results of your imaging and laboratory studies on MyChart before your provider has had a chance to review them.  We understand that in some cases there may be results that are confusing or concerning to you. Not all laboratory results come back in the same time frame and the provider may be waiting for multiple results in order to interpret others.  Please give [place] 48 hours in order for your provider to thoroughly review all the results before contacting the office for clarification of your results.    It was a pleasure to see you today!  Thank you for trusting me with your gastrointestinal care!

## 2022-05-13 NOTE — Progress Notes (Signed)
Agree with the assessment and plan as outlined by Amanda Collier,  PA-C.  Konstantina Nachreiner E. Ellison Leisure, MD  

## 2022-05-27 ENCOUNTER — Ambulatory Visit: Payer: No Typology Code available for payment source | Admitting: Medical

## 2022-05-28 ENCOUNTER — Telehealth: Payer: Self-pay | Admitting: Pharmacist

## 2022-05-28 ENCOUNTER — Encounter: Payer: Self-pay | Admitting: Cardiology

## 2022-05-28 ENCOUNTER — Ambulatory Visit: Payer: No Typology Code available for payment source | Admitting: Cardiology

## 2022-05-28 VITALS — BP 124/80 | HR 75 | Temp 97.9°F | Resp 16 | Ht 68.0 in | Wt 194.2 lb

## 2022-05-28 DIAGNOSIS — Z952 Presence of prosthetic heart valve: Secondary | ICD-10-CM | POA: Diagnosis not present

## 2022-05-28 DIAGNOSIS — E782 Mixed hyperlipidemia: Secondary | ICD-10-CM | POA: Diagnosis not present

## 2022-05-28 DIAGNOSIS — I70213 Atherosclerosis of native arteries of extremities with intermittent claudication, bilateral legs: Secondary | ICD-10-CM

## 2022-05-28 DIAGNOSIS — I442 Atrioventricular block, complete: Secondary | ICD-10-CM | POA: Diagnosis not present

## 2022-05-28 DIAGNOSIS — Z950 Presence of cardiac pacemaker: Secondary | ICD-10-CM | POA: Diagnosis not present

## 2022-05-28 NOTE — Chronic Care Management (AMB) (Signed)
Chronic Care Management Pharmacy Assistant   Name: Jonathan Brown  MRN: [id number] DOB: 10/05/1951  Reason for Encounter: Disease State   Conditions to be addressed/monitored: HTN and DMII  Recent office visits:  None   Recent consult visits:  05/28/22 Yates Decamp, MD (Cardiology) - Patient presented for CHB and other concerns. Stopped Latanoprost drops.  05/13/22 Vivianne Master) - Patient presented for Iron deficiency anemia unspecified iron deficiency anemia type and other concerns. Prescribed Sodium Sulfate- Mag. Stopped Hydrocortisone 2.5%.  05/07/22 Scronce, Gar Ponto, RN (Cardio Research) - Patient presented for Research study visit. No other visit details available.   Hospital visits:  None in previous 6 months  Medications: Outpatient Encounter Medications as of 05/28/2022  Medication Sig   acetaminophen (TYLENOL) 500 MG tablet Take 1,000 mg by mouth in the morning and at bedtime.   amLODipine (NORVASC) 10 MG tablet TAKE 1 TABLET BY MOUTH EVERY DAY (Patient taking differently: Take 10 mg by mouth daily.)   aspirin EC 81 MG tablet Take 1 tablet (81 mg total) by mouth daily.   cholecalciferol (VITAMIN D3) 25 MCG (1000 UNIT) tablet Take 1 tablet (1,000 Units total) by mouth daily.   fenofibrate (TRICOR) 48 MG tablet Take 1 tablet (48 mg total) by mouth daily.   ferrous gluconate (FERGON) 324 MG tablet TAKE 1 TABLET (324 MG TOTAL) BY MOUTH 2 (TWO) TIMES DAILY WITH A MEAL.   glucose blood test strip 100 each by Other route daily. Use as instructed   Dispense 100 box   12 refills   JARDIANCE 10 MG TABS tablet TAKE 1 TABLET BY MOUTH EVERY DAY BEFORE BREAKFAST   Lancets (ONETOUCH ULTRASOFT) lancets Use as instructed   metFORMIN (GLUCOPHAGE) 500 MG tablet TAKE ONE TABLET BY MOUTH TWICE DAILY WITH  MEAL (Patient taking differently: Take 500 mg by mouth 2 (two) times daily with a meal.)   metoprolol tartrate (LOPRESSOR) 50 MG tablet Take 1 tablet (50 mg total) by mouth 2  (two) times daily.   omeprazole (PRILOSEC) 40 MG capsule TAKE 1 CAPSULE BY MOUTH EVERY DAY   rosuvastatin (CRESTOR) 20 MG tablet TAKE 1 TABLET BY MOUTH EVERYDAY AT BEDTIME (Patient taking differently: Take 20 mg by mouth at bedtime.)   Sodium Sulfate-Mag Sulfate-KCl (SUTAB) [phone] MG TABS Use as directed for colonoscopy. MANUFACTURER CODES!! BIN: [id number] PCN: CN GROUP: [id number] MEMBER ID: [id number];JGO AS SECONDARY INSURANCE ;NO PRIOR AUTHORIZATION   Tetrahydrozoline HCl (VISINE OP) Apply 1 drop to eye daily as needed (dry eyes).   valsartan-hydrochlorothiazide (DIOVAN-HCT) 160-12.5 MG tablet TAKE 1 TABLET BY MOUTH EVERY DAY IN THE MORNING (Patient taking differently: Take 1 tablet by mouth daily.)   vitamin B-12 (CYANOCOBALAMIN) 50 MCG tablet Take 50 mcg by mouth daily.   No facility-administered encounter medications on file as of 05/28/2022.   Reviewed chart prior to disease state call. Spoke with patient regarding BP  Recent Office Vitals: BP Readings from Last 3 Encounters:  05/28/22 124/80  05/13/22 130/80  01/27/22 120/70   Pulse Readings from Last 3 Encounters:  05/28/22 75  05/13/22 71  01/27/22 65    Wt Readings from Last 3 Encounters:  05/28/22 194 lb 3.2 oz (88.1 kg)  05/13/22 192 lb (87.1 kg)  01/27/22 194 lb 12.8 oz (88.4 kg)     Kidney Function Lab Results  Component Value Date/Time   CREATININE 1.31 (H) 01/27/2022 11:23 AM   CREATININE 1.30 (H) 09/28/2021 06:04 AM   CREATININE 1.07 03/14/2017 11:02 AM  CREATININE 1.05 06/23/2016 03:50 PM   GFRNONAA >60 09/28/2021 05:49 AM   GFRAA 63 12/11/2020 10:08 AM       Latest Ref Rng & Units 01/27/2022   11:23 AM 09/28/2021    6:04 AM 09/28/2021    5:49 AM  BMP  Glucose 70 - 99 mg/dL 378   588   502    BUN 8 - 27 mg/dL 18   19   17     Creatinine 0.76 - 1.27 mg/dL   7.74   1.28    BUN/Creat Ratio 10 - 24 14      Sodium 134 - 144 mmol/L 141   139   137    Potassium 3.5 - 5.2 mmol/L 4.3   3.4   3.3     Chloride 96 - 106 mmol/L 107   103   104    CO2 20 - 29 mmol/L 20    22    Calcium 8.6 - 10.2 mg/dL 7.86    9.8      Current antihypertensive regimen:  Amlodipine 10 mg 1 tablet daily - Appropriate, Query effective, Safe, Accessible Valsartan-HCTZ 160-12.5 mg 1 tablet daily - Appropriate, Query effective, Safe, Accessible Metoprolol tartrate 50 mg 1 tablet twice daily - Appropriate, Query effective, Safe, Accessible How often are you checking your Blood Pressure? infrequently Current home BP readings: 124/80 in office today Wife reports they do not check at home as he does not have a machine to do so. She reports he denies any hyper/hypotensive symptoms.  Adherence Review: Is the patient currently on ACE/ARB medication? Yes Does the patient have >5 day gap between last estimated fill dates? No   Recent Relevant Labs: Lab Results  Component Value Date/Time   HGBA1C 6.6 (A) 01/27/2022 11:14 AM   HGBA1C 6.4 (H) 09/01/2021 11:53 AM   HGBA1C 6.1 (A) 12/12/2020 11:26 AM   HGBA1C 5.8 (H) 09/09/2020 01:14 PM   MICROALBUR 0.2 03/14/2017 11:02 AM   MICROALBUR 1.8 02/03/2016 12:01 AM    Kidney Function Lab Results  Component Value Date/Time   CREATININE 1.31 (H) 01/27/2022 11:23 AM   CREATININE 1.30 (H) 09/28/2021 06:04 AM   CREATININE 1.07 03/14/2017 11:02 AM   CREATININE 1.05 06/23/2016 03:50 PM   GFRNONAA >60 09/28/2021 05:49 AM   GFRAA 63 12/11/2020 10:08 AM    Current antihyperglycemic regimen:  Jardiance 10 mg 1 tablet daily - Appropriate, Query effective, Safe, Accessible Metformin 500 mg 1 tablet twice daily - Appropriate, Query effective, Safe, Accessible What recent interventions/DTPs have been made to improve glycemic control:  Wife reports none Have there been any recent hospitalizations or ED visits since last visit with CPP? No Patient denies hypoglycemic symptoms, including None Patient denies hyperglycemic symptoms, including none How often are you checking your  blood sugar? Wife reports he also does not have a glucometer that is working well. She reports she has in the past used hers for him but does not think it is calibrated as hers is very old. She inquired if there were any sample glucometer's in the office that she could pick up and have education on 06/03/22 as he has in appointment wit PCP at that time. Advised her I would check with the Pharmacist and if not let her know. She reports if not she would like something easy and simple to use to be sent to [facility] on [address] that she will pick up prior to the appt. Per Pharmacist diabetic test supplies sent  [facility] to [facility]/ call back to wife and made her aware they are there for her to pick up she was in agreement   Adherence Review: Is the patient currently on a STATIN medication? Yes Is the patient currently on ACE/ARB medication? Yes Does the patient have >5 day gap between last estimated fill dates? No       Care Gaps: TDAP - Overdue Zoster Vaccine - Overdue COVID Booster - Overdue Eye Exam - Overdue CCM- Need in sept decl at this time  AWV- 11/22 BP- 130/80 ( 05/13/22) Lab Results  Component Value Date   HGBA1C 6.6 (A) 01/27/2022    Star Rating Drugs: Valsartan HCTZ - [facility]Jardiance[facility] 10 mg - [facility]  Metformin 500 mg - Last [facility]/28/22 [facility] Rosuvastatin 20 mg - Last [facility]/28/22 [facility]     Pamala DuffelLaresia Green CMA Clinical Pharmacist Assistant [phone]

## 2022-05-28 NOTE — Progress Notes (Signed)
Primary Physician/Referring:  Jac Canavan, PA-C  Patient ID: Jonathan Brown, male    DOB: 08-15-1951, 71 y.o.   MRN: [id number]  Chief Complaint  Patient presents with   Complete heart block   Hypertension   Hyperlipidemia   Follow-up   HPI:    Jonathan Brown  is a 71 y.o. AA male  patient status post aortic valve replacement using 21 mm pericardial Magna Ease valve for severe aortic stenosis 09/29/2017, Medtronic dual-chamber pacemaker for complete heart block status post aortic valve replacement 10/03/2017, hypertension, DM with chronic stage IIIa kidney disease, mixed hyperlipidemia, PAD. 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  This is a 77-month office visit, states that he is doing well, he has reduced his activity as his bilateral calves hurt when he walks.  Otherwise no associated chest pain or dyspnea and he has been compliant with all his medications. He presented with dyspnea, hiccups after he used cocaine and marijuana as a one-time recreation, presented to the emergency room, has been abstinent since.  Past Medical History:  Diagnosis Date   Anemia    Arthritis    Cataract 02/26/2021   Bilateral   CHB (complete heart block) (HCC) 10/05/2017   Diabetes mellitus without complication (HCC) 2014   type 2    Encounter for care of pacemaker 07/20/2019   Former smoker    GERD (gastroesophageal reflux disease)    History of colon polyps    Hyperlipidemia    Hypertension 2012   Open-angle glaucoma 02/26/2021   Pacemaker Medtronic Azure XT DR MRI [id number] Dual chamber pacemaker in situ 10/03/2017   PVD (peripheral vascular disease) (HCC)    S/P AVR    Aortic valve replacement using a 21-mm pericardial Magna Ease (09/29/2017 by Dr. 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    wife is able to read to him   Past Surgical History:   Procedure Laterality Date   AORTIC VALVE REPLACEMENT N/A 09/29/2017   Procedure: AORTIC VALVE REPLACEMENT (AVR) using 72mm Magna Ease valve;  Surgeon: Kerin Perna, MD;  Location[facility];  Service: Open Heart Surgery;  Laterality: N/A;   CARDIAC CATHETERIZATION  07/12/2017   COLONOSCOPY  2008   LEFT AND RIGHT HEART CATHETERIZATION WITH CORONARY ANGIOGRAM N/A 04/02/2014   Procedure: LEFT AND RIGHT HEART CATHETERIZATION WITH CORONARY ANGIOGRAM;  Surgeon: Pamella Pert, MD;  Location: [facility] CATH LAB;  Service: Cardiovascular;  Laterality: N/A;   PACEMAKER IMPLANT N/A 10/03/2017   Procedure: PACEMAKER IMPLANT;  Surgeon: Marinus Maw, MD;  Location: [facility] INVASIVE CV LAB;  Service: Cardiovascular;  Laterality: N/A;   TEE WITHOUT CARDIOVERSION N/A 04/30/2014   TEE WITHOUT CARDIOVERSION N/A 09/29/2017   Procedure: TRANSESOPHAGEAL ECHOCARDIOGRAM (TEE);  Surgeon: Donata Clay, Theron Arista, MD;  Location: [facility] OR;  Service: Open Heart Surgery;  Laterality: N/A;   Social History   Tobacco Use   Smoking status: Former    Packs/day: 0.25    Years: 30.00    Pack years: 7.50    Types: Cigarettes    Quit date: 09/28/2007    Years since quitting: 14.6   Smokeless tobacco: Never  Substance Use Topics   Alcohol use: Yes    Comment: occasionally   Marital Status: Married  ROS  Review of Systems  Cardiovascular:  Positive for claudication (bilateral calf). Negative for  chest pain, dyspnea on exertion and leg swelling.  Gastrointestinal:  Negative for melena.  Objective  Blood pressure 124/80, pulse 75, temperature 97.9 F (36.6 C), temperature source Temporal, resp. rate 16, height  (1.727 m), weight 194 lb 3.2 oz (88.1 kg), SpO2 99 %.     05/28/2022   10:06 AM 05/28/2022    9:54 AM 05/13/2022    9:34 AM  Vitals with BMI  Height     Weight  194 lbs 3 oz 192 lbs  BMI  29.54 29.2  Systolic 124 142 161  Diastolic 80 82 80  Pulse  75 71     Physical Exam Constitutional:      Appearance: He is  well-developed.  Neck:     Thyroid: No thyromegaly.     Vascular: No JVD.  Cardiovascular:     Rate and Rhythm: Normal rate and regular rhythm.     Pulses:          Carotid pulses are 2+ on the right side with bruit and 2+ on the left side with bruit.      Femoral pulses are 2+ on the right side and 2+ on the left side.      Popliteal pulses are 2+ on the right side and 1+ on the left side.       Dorsalis pedis pulses are 0 on the right side and 0 on the left side.       Posterior tibial pulses are 2+ on the right side and 0 on the left side.     Heart sounds: Murmur heard.  Harsh midsystolic murmur is present with a grade of 3/6 radiating to the neck.    No gallop.     Comments: Skin is shiny and hair loss noted.   Pulmonary:     Effort: Pulmonary effort is normal.     Breath sounds: Normal breath sounds.  Abdominal:     General: Bowel sounds are normal.     Palpations: Abdomen is soft.  Musculoskeletal:     Right lower leg: Edema (2+ ankle) present.     Left lower leg: Edema (2+ ankle) present.   Laboratory examination:   Recent Labs    09/21/21 1854 09/28/21 0549 09/28/21 0604 01/27/22 1123  NA 137 137 139 141  K 3.1* 3.3* 3.4* 4.3  CL 108 104 103 107*  CO2 15* 22  --  20  GLUCOSE 190* 104* 104* 173*  BUN CREATININE 1.45* 1.29* 1.30* 1.31*  CALCIUM 9.6 9.8  --  10.3*  GFRNONAA 52* >60  --   --    CrCl cannot be calculated (Patient's most recent lab result is older than the maximum 21 days allowed.).     Latest Ref Rng & Units 01/27/2022   11:23 AM 09/28/2021    6:04 AM 09/28/2021    5:49 AM  CMP  Glucose 70 - 99 mg/dL 096   045   409    BUN 8 - 27 mg/dL Creatinine 0.76 - 1.27 mg/dL 8.11   9.14   7.82    Sodium 134 - 144 mmol/L 141   139   137    Potassium 3.5 - 5.2 mmol/L 4.3   3.4   3.3    Chloride 96 - 106 mmol/L 107   103   104    CO2 20 - 29 mmol/L 20  22    Calcium 8.6 - 10.2 mg/dL 16.1    9.8    Total Protein 6.0 - 8.5  g/dL 6.9    6.9    Total Bilirubin 0.0 - 1.2 mg/dL 0.3    0.9    Alkaline Phos 44 - 121 IU/L 79    60    AST 0 - 40 IU/L 18    18    ALT 0 - 44 IU/L 14    19        Latest Ref Rng & Units 01/28/2022    3:59 PM 09/28/2021    6:04 AM 09/28/2021    5:49 AM  CBC  WBC 3.4 - 10.8 x10E3/uL 5.7    7.6    Hemoglobin 13.0 - 17.7 g/dL 09.6   04.5   40.9    Hematocrit 37.5 - 51.0 % 37.1   36.0   35.8    Platelets 150 - 450 x10E3/uL 229    204     Lab Results  Component Value Date   CHOL 113 10/29/2021   HDL 29 (L) 10/29/2021   LDLCALC 45 10/29/2021   TRIG 247 (H) 10/29/2021   CHOLHDL 4.0 09/09/2020     HEMOGLOBIN A1C Lab Results  Component Value Date   HGBA1C 6.6 (A) 01/27/2022   MPG 99.67 09/27/2017   TSH Recent Labs    09/01/21 1153  TSH 2.350   Medications and allergies  No Known Allergies   Current Outpatient Medications:    acetaminophen (TYLENOL) 500 MG tablet, Take 1,000 mg by mouth in the morning and at bedtime., Disp: , Rfl:    amLODipine (NORVASC) 10 MG tablet, TAKE 1 TABLET BY MOUTH EVERY DAY (Patient taking differently: Take 10 mg by mouth daily.), Disp: 90 tablet, Rfl: 3   aspirin EC 81 MG tablet, Take 1 tablet (81 mg total) by mouth daily., Disp: 90 tablet, Rfl: 3   cholecalciferol (VITAMIN D3) 25 MCG (1000 UNIT) tablet, Take 1 tablet (1,000 Units total) by mouth daily., Disp: 90 tablet, Rfl: 3   fenofibrate (TRICOR) 48 MG tablet, Take 1 tablet (48 mg total) by mouth daily., Disp: 90 tablet, Rfl: 3   ferrous gluconate (FERGON) 324 MG tablet, TAKE 1 TABLET (324 MG TOTAL) BY MOUTH 2 (TWO) TIMES DAILY WITH A MEAL., Disp: 180 tablet, Rfl: 1   glucose blood test strip, 100 each by Other route daily. Use as instructed   Dispense 100 box   12 refills, Disp: 100 each, Rfl: 12   JARDIANCE 10 MG TABS tablet, TAKE 1 TABLET BY MOUTH EVERY DAY BEFORE BREAKFAST, Disp: 90 tablet, Rfl: 0   Lancets (ONETOUCH ULTRASOFT) lancets, Use as instructed, Disp: 100 each, Rfl: 12   metFORMIN  (GLUCOPHAGE) 500 MG tablet, TAKE ONE TABLET BY MOUTH TWICE DAILY WITH  MEAL (Patient taking differently: Take 500 mg by mouth 2 (two) times daily with a meal.), Disp: 180 tablet, Rfl: 3   metoprolol tartrate (LOPRESSOR) 50 MG tablet, Take 1 tablet (50 mg total) by mouth 2 (two) times daily., Disp: 180 tablet, Rfl: 3   omeprazole (PRILOSEC) 40 MG capsule, TAKE 1 CAPSULE BY MOUTH EVERY DAY, Disp: 90 capsule, Rfl: 0   rosuvastatin (CRESTOR) 20 MG tablet, TAKE 1 TABLET BY MOUTH EVERYDAY AT BEDTIME (Patient taking differently: Take 20 mg by mouth at bedtime.), Disp: 90 tablet, Rfl: 3   Sodium Sulfate-Mag Sulfate-KCl (SUTAB) [id number] MG TABS, Use as directed for colonoscopy. MANUFACTURER CODES!! Johnney Killian: [id number] PCN: CN GROUP: [id number] MEMBER  ID: [id number];RUN AS SECONDARY INSURANCE ;NO PRIOR AUTHORIZATION, Disp: 24 tablet, Rfl: 0   Tetrahydrozoline HCl (VISINE OP), Apply 1 drop to eye daily as needed (dry eyes)., Disp: , Rfl:    valsartan-hydrochlorothiazide (DIOVAN-HCT) 160-12.5 MG tablet, TAKE 1 TABLET BY MOUTH EVERY DAY IN THE MORNING (Patient taking differently: Take 1 tablet by mouth daily.), Disp: 90 tablet, Rfl: 3   vitamin B-12 (CYANOCOBALAMIN) 50 MCG tablet, Take 50 mcg by mouth daily., Disp: , Rfl:     Radiology:  No results found.  Cardiac Studies:   Lower extremity arterial duplex 07/06/2017 No hemodynamically significant stenoses are identified in the right lower extremity arterial system. Significant velocity increase at the left distal superficial femoral artery suggestive of >50% stenosis. This exam reveals moderately decreased perfusion of the lower extremity, RABI 0.76 and LABI 0.76. 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 Compared to the study done on 03/06/2014, bilateral ABI has decreased from normal. No change in the left mid SFA stenosis.  Right and left heart catheterization 07/12/2017:  Normal to mild Mild elevation in pulmonary artery pressure, normal LVEDP, PA pressure 35/12, mean 23  mmHg. Mild noncritical coronary artery disease, ostial ramus 30% calcific stenosis, otherwise 10-20% luminal irregularity in all 3 vessels. Mild disease in the RCA. Aortic valve could not be crossed. Heavily calcified, mild aortic root dilatation and AV appears to be bicuspid.  Carotid artery duplex  06/02/2020:  No hemodynamically significant arterial disease in the internal carotid  artery bilaterally.  Antegrade right vertebral artery flow. Antegrade left vertebral artery  flow.  Echocardiogram 06/02/2020:  Left ventricle cavity is normal in size. Moderate concentric hypertrophy of the left ventricle. Abnormal septal wall motion due to post-operative valve. Normal LV systolic function with visual EF 50-55%. Doppler evidence of grade I (impaired) diastolic dysfunction, normal LAP.  Left atrial cavity is moderately dilated.  S/p 21-mm pericardial Magna Ease valve  placement (2018). Normal functioning valve without any paravalular leak, dehiscence. Mean PG 15 mmHg is acceptable. likely normal for post-op status.  Mild tricuspid regurgitation. No evidence of pulmonary hypertension.  Compared to previous study on 07/06/2017, aortic valve replacement is new.   Device check:  Medtronic Azure XT DR MRI [id number] Dual chamber pacemaker in situ   Remote dual-chamber pacemaker transmission 05/13/2022: AP 18%, VP 100%.  Lead impedance and thresholds within normal limits.  Longevity 5 years.  Brief atrial tachycardia episode for 2 minutes.  Normal pacemaker function.  EKG: EKG 05/28/2022: Normal sinus rhythm at rate of 69 bpm, ventricularly paced rhythm.  No further analysis.  No significant change from 10/26/2021.  Assessment     ICD-10-CM   1. CHB (complete heart block) (HCC)  I44.2 EKG 12-Lead    2. S/P AVR with 21 mm Magna Ease Pericardial Tissue Valve,  09/18/2017: Karsten RoP. VanTright, MD  Z95.2 PCV ECHOCARDIOGRAM COMPLETE    3. Atherosclerosis of native artery of both lower extremities with intermittent  claudication (HCC)  I70.213 PCV LOWER ARTERIAL (BILATERAL)    4. Mixed hyperlipidemia  E78.2 CANCELED: Lipid Panel With LDL/HDL Ratio    5. Pacemaker Medtronic Azure XT DR MRI [id number] Dual chamber pacemaker in situ  Z95.0       Orders Placed This Encounter  Procedures   EKG 12-Lead   PCV ECHOCARDIOGRAM COMPLETE    Standing Status:   Future    Standing Expiration Date:   05/29/2023    Recommendations:    Jonathan Brown  is a 71 y.o. AA male  patient status post aortic  valve replacement using 21 mm pericardial Magna Ease valve for severe aortic stenosis 09/29/2017, Medtronic dual-chamber pacemaker for complete heart block status post aortic valve replacement 10/03/2017, hypertension, DM with chronic stage IIIa kidney disease, mixed hyperlipidemia, PAD. 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  Patient presented with dyspnea and hiccups after he had used cocaine and marijuana on a recreational basis on 09/28/2021, one time exposure and he has remained abstinant. His main complaint today is bilateral calf claudicaiton, however states that it is very mild symptoms.  He will try to increase his physical activity.  Advised him that if it is lifestyle limiting only then we can consider revascularization.  In view of patient being diabetic, I would like to repeat lower extremity arterial duplex/ABI to follow-up on progression of PAD.  With regard to hypertension, blood pressure is well controlled.  He is on ARB.  Also on amlodipine and a diuretic.  No clinical evidence of heart failure.  Aortic systolic murmur is more prominent than last year, Leviste grade 3, would like to repeat echocardiogram as well.   His pacemaker is functioning normally.    On his last office visit due to elevated triglycerides and added Tricor, he needs repeat lipid profile testing.  Office visit in 6 months unless I see significant abnormality in the lower extremity arterial duplex or  echocardiogram or if patient develops significant symptoms of claudication he will call us and contact us and we could consider peripheral arteriogram for evaluation of his symptoms.    Yates Decamp, MD, [facility] 05/28/2022, 10:29 AM Office: [phone] Pager: [phone]

## 2022-05-31 ENCOUNTER — Telehealth: Payer: Self-pay | Admitting: Internal Medicine

## 2022-05-31 DIAGNOSIS — E118 Type 2 diabetes mellitus with unspecified complications: Secondary | ICD-10-CM

## 2022-05-31 MED ORDER — ONETOUCH ULTRASOFT LANCETS MISC: 12 refills | Status: DC

## 2022-05-31 MED ORDER — BLOOD GLUCOSE MONITORING SUPPL DEVI: 0 refills | Status: DC

## 2022-05-31 MED ORDER — GLUCOSE BLOOD VI STRP: ORAL_STRIP | 12 refills | Status: DC

## 2022-06-01 NOTE — Telephone Encounter (Signed)
Pt needed new meter and supplies with it

## 2022-06-03 ENCOUNTER — Ambulatory Visit (INDEPENDENT_AMBULATORY_CARE_PROVIDER_SITE_OTHER): Payer: No Typology Code available for payment source | Admitting: Medical

## 2022-06-03 VITALS — BP 120/70 | HR 70 | Ht 69.0 in | Wt 190.4 lb

## 2022-06-03 DIAGNOSIS — I70213 Atherosclerosis of native arteries of extremities with intermittent claudication, bilateral legs: Secondary | ICD-10-CM

## 2022-06-03 DIAGNOSIS — E118 Type 2 diabetes mellitus with unspecified complications: Secondary | ICD-10-CM

## 2022-06-03 DIAGNOSIS — E1169 Type 2 diabetes mellitus with other specified complication: Secondary | ICD-10-CM | POA: Diagnosis not present

## 2022-06-03 DIAGNOSIS — Z7185 Encounter for immunization safety counseling: Secondary | ICD-10-CM

## 2022-06-03 DIAGNOSIS — D509 Iron deficiency anemia, unspecified: Secondary | ICD-10-CM

## 2022-06-03 DIAGNOSIS — I10 Essential (primary) hypertension: Secondary | ICD-10-CM | POA: Diagnosis not present

## 2022-06-03 DIAGNOSIS — E1141 Type 2 diabetes mellitus with diabetic mononeuropathy: Secondary | ICD-10-CM

## 2022-06-03 DIAGNOSIS — E785 Hyperlipidemia, unspecified: Secondary | ICD-10-CM

## 2022-06-03 DIAGNOSIS — I700 Atherosclerosis of aorta: Secondary | ICD-10-CM

## 2022-06-03 LAB — POCT URINALYSIS DIP (PROADVANTAGE DEVICE)
Bilirubin, UA: NEGATIVE
Blood, UA: NEGATIVE
Glucose, UA: >=1000 mg/dL — AB
Ketones, POC UA: NEGATIVE mg/dL
Leukocytes, UA: NEGATIVE
Nitrite, UA: NEGATIVE
Protein Ur, POC: NEGATIVE mg/dL
Specific Gravity, Urine: 1.02
Urobilinogen, Ur: NEGATIVE
pH, UA: 6 (ref 5.0–8.0)

## 2022-06-03 NOTE — Progress Notes (Signed)
Subjective:  Jonathan Brown is a 71 y.o. male who presents for Chief Complaint  Patient presents with   diabetes    Diabetes      Here for med check, routine follow-up.  He is nonfasting this morning  Diabetes-he reports compliance with Jardiance 10 mg daily, metformin 500 mg twice daily,.  He notes home blood sugars have been looking pretty good.  No foot lesions, no polyuria polydipsia.  Hyperlipidemia-he reports compliance with Crestor 20 mg daily and fenofibrate  Hypertension-he reports compliance with medication.  No chest pain no swelling no shortness of breath.  No other new complaints  Last visit he was found to have iron deficiency anemia.  He is taking the iron twice daily that was begun.  He has a colonoscopy coming up in about 2 weeks  No other aggravating or relieving factors.    No other c/o.  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     amLODipine (NORVASC) 10 MG tablet TAKE 1 TABLET BY MOUTH EVERY DAY (Patient taking  differently: Take 10 mg by mouth daily.) 90 tablet 3   aspirin EC 81 MG tablet Take 1 tablet (81 mg total) by mouth daily. 90 tablet 3   Blood Glucose Monitoring Suppl DEVI Test twice a day. Dispense one touch meter 1 kit 0   cholecalciferol (VITAMIN D3) 25 MCG (1000 UNIT) tablet Take 1 tablet (1,000 Units total) by mouth daily. 90 tablet 3   fenofibrate (TRICOR) 48 MG tablet Take 1 tablet (48 mg total) by mouth daily. 90 tablet 3   ferrous gluconate (FERGON) 324 MG tablet TAKE 1 TABLET (324 MG TOTAL) BY MOUTH 2 (TWO) TIMES DAILY WITH A MEAL. 180 tablet 1   JARDIANCE 10 MG TABS tablet TAKE 1 TABLET BY MOUTH EVERY DAY BEFORE BREAKFAST 90 tablet 0   metFORMIN (GLUCOPHAGE) 500 MG tablet TAKE ONE TABLET BY MOUTH TWICE DAILY WITH  MEAL (Patient taking differently: Take 500 mg by mouth 2 (two) times daily with a meal.) 180 tablet 3   metoprolol tartrate (LOPRESSOR) 50 MG tablet Take 1 tablet (50 mg total) by mouth 2 (two) times daily. 180 tablet 3   omeprazole (PRILOSEC) 40 MG capsule TAKE 1 CAPSULE BY MOUTH EVERY DAY 90 capsule 0   rosuvastatin (CRESTOR) 20 MG tablet TAKE 1 TABLET BY MOUTH EVERYDAY AT BEDTIME (Patient taking differently: Take 20 mg by mouth at  bedtime.) 90 tablet 3   Tetrahydrozoline HCl (VISINE OP) Apply 1 drop to eye daily as needed (dry eyes).     valsartan-hydrochlorothiazide (DIOVAN-HCT) 160-12.5 MG tablet TAKE 1 TABLET BY MOUTH EVERY DAY IN THE MORNING (Patient taking differently: Take 1 tablet by mouth daily.) 90 tablet 3   vitamin B-12 (CYANOCOBALAMIN) 50 MCG tablet Take 50 mcg by mouth daily.     glucose blood test strip Test 1-2 times a day. Pt uses onetouch meter 100 each 12   Lancets (ONETOUCH ULTRASOFT) lancets Test 1-2 times a day. E11.9 100 each 12   Sodium Sulfate-Mag Sulfate-KCl (SUTAB) [phone] MG TABS Use as directed for colonoscopy. MANUFACTURER CODES!! BIN: [id number] PCN: CN GROUP: [id number] MEMBER ID: [id number];[employer] SEC[employer]ARY INS[employer]ANCE ;NO PRIOR AUTHORIZATION  (Patient not taking: Reported on 06/03/2022) 24 tablet 0   No current facility-administered medications on file prior to visit.   The following portions of the patient's history were reviewed and updated as appropriate: allergies, current medications, past family history, past medical history, past social history, past surgical history and problem list.  ROS Otherwise as in subjective above    Objective: BP 120/70   Pulse 70   Ht 5' 9" (1.753 m)   Wt 190 lb 6.4 oz (86.4 kg)   BMI 28.12 kg/m   General appearance: alert, no distress, well developed, well nourished Neck: supple, no lymphadenopathy, no thyromegaly, no masses Heart: RRR, normal S1, S2, 2/6 holosystolic murmur Lungs: CTA bilaterally, no wheezes, rhonchi, or rales Pulses: 2+ radial pulses, 2+ pedal pulses, normal cap refill Ext: no edema   Assessment: Encounter Diagnoses  Name Primary?   Type 2 diabetes mellitus with complication, without long-term current use of insulin ([place]) Yes   Hyperlipidemia associated with type 2 diabetes mellitus ([place])    Essential hypertension    Diabetic mononeuropathy associated with type 2 diabetes mellitus ([place])    Atherosclerosis of native artery of both lower extremities with intermittent claudication (HCC)    Aortic atherosclerosis (HCC)    Iron deficiency anemia, unspecified iron deficiency anemia type    Vaccine counseling      Plan: Diabetes Continue Jardiance 10 mg daily, metformin 500 mg twice daily Continue blood sugar monitoring Eat a healthy low sugar diet Check your feet daily for sores or wounds  Iron deficiency anemia Follow-up for colonoscopy as planned in 2 weeks Continue iron twice daily  High cholesterol Continue on fenofibrate 48 mg daily and rosuvastatin 20 mg daily  High blood pressure Continue amlodipine 10 mg daily, metoprolol 50 mg twice daily, valsartan HCT 160/12.5 mg daily  Aortic atherosclerosis-continue statin and aspirin daily    Jonathan Brown  was seen today for diabetes.  Diagnoses and all orders for this visit:  Type 2 diabetes mellitus with complication, without long-term current use of insulin (HCC) -     Hemoglobin A1c -     Comprehensive metabolic panel  Hyperlipidemia associated with type 2 diabetes mellitus (HCC)  Essential hypertension -     POCT Urinalysis DIP (Proadvantage Device)  Diabetic mononeuropathy associated with type 2 diabetes mellitus (HCC)  Atherosclerosis of native artery of both lower extremities with intermittent claudication (HCC)  Aortic atherosclerosis (HCC)  Iron deficiency anemia, unspecified iron deficiency anemia type -     CBC with Differential/Platelet -     Iron, TIBC and Ferritin Panel -     POCT Urinalysis DIP (Proadvantage Device)  Vaccine counseling    Follow up: pending labs

## 2022-06-03 NOTE — Patient Instructions (Signed)
Check with insurance about coverage for Shingrix and tetanus vaccines at your pharmacy

## 2022-06-04 ENCOUNTER — Other Ambulatory Visit: Payer: Self-pay | Admitting: Medical

## 2022-06-04 LAB — CBC WITH DIFFERENTIAL/PLATELET
Basophils Absolute: 0.1 10*3/uL (ref 0.0–0.2)
Basos: 1 %
EOS (ABSOLUTE): 0.3 10*3/uL (ref 0.0–0.4)
Eos: 4 %
Hematocrit: 40.5 % (ref 37.5–51.0)
Hemoglobin: 13.9 g/dL (ref 13.0–17.7)
Immature Grans (Abs): 0 10*3/uL (ref 0.0–0.1)
Immature Granulocytes: 0 %
Lymphocytes Absolute: 1.9 10*3/uL (ref 0.7–3.1)
Lymphs: 32 %
MCH: 30.8 pg (ref 26.6–33.0)
MCHC: 34.3 g/dL (ref 31.5–35.7)
MCV: 90 fL (ref 79–97)
Monocytes Absolute: 0.7 10*3/uL (ref 0.1–0.9)
Monocytes: 11 %
Neutrophils Absolute: 3.1 10*3/uL (ref 1.4–7.0)
Neutrophils: 52 %
Platelets: 168 10*3/uL (ref 150–450)
RBC: 4.52 x10E6/uL (ref 4.14–5.80)
RDW: 12.9 % (ref 11.6–15.4)
WBC: 6 10*3/uL (ref 3.4–10.8)

## 2022-06-04 LAB — COMPREHENSIVE METABOLIC PANEL
ALT: 25 IU/L (ref 0–44)
AST: 20 IU/L (ref 0–40)
Albumin/Globulin Ratio: 1.6 (ref 1.2–2.2)
Albumin: 4.4 g/dL (ref 3.8–4.8)
Alkaline Phosphatase: 89 IU/L (ref 44–121)
BUN/Creatinine Ratio: 13 (ref 10–24)
BUN: 21 mg/dL (ref 8–27)
Bilirubin Total: 0.4 mg/dL (ref 0.0–1.2)
CO2: 22 mmol/L (ref 20–29)
Calcium: 10.4 mg/dL — ABNORMAL HIGH (ref 8.6–10.2)
Chloride: 104 mmol/L (ref 96–106)
Creatinine, Ser: 1.58 mg/dL — ABNORMAL HIGH (ref 0.76–1.27)
Globulin, Total: 2.7 g/dL (ref 1.5–4.5)
Glucose: 108 mg/dL — ABNORMAL HIGH (ref 70–99)
Potassium: 4.1 mmol/L (ref 3.5–5.2)
Sodium: 138 mmol/L (ref 134–144)
Total Protein: 7.1 g/dL (ref 6.0–8.5)
eGFR: 47 mL/min/{1.73_m2} — ABNORMAL LOW (ref >59)

## 2022-06-04 LAB — HEMOGLOBIN A1C
Est. average glucose Bld gHb Est-mCnc: 126 mg/dL
Hgb A1c MFr Bld: 6 % — ABNORMAL HIGH (ref 4.8–5.6)

## 2022-06-04 LAB — IRON,TIBC AND FERRITIN PANEL
Ferritin: 101 ng/mL (ref 30–400)
Iron Saturation: 19 % (ref 15–55)
Iron: 68 ug/dL (ref 38–169)
Total Iron Binding Capacity: 358 ug/dL (ref 250–450)
UIBC: 290 ug/dL (ref 111–343)

## 2022-06-04 MED ORDER — VALSARTAN 160 MG PO TABS: 160.0000 mg | ORAL_TABLET | Freq: Every day | ORAL | 2 refills | Status: DC

## 2022-06-09 ENCOUNTER — Encounter: Payer: Self-pay | Admitting: Gastroenterology

## 2022-06-16 ENCOUNTER — Encounter: Payer: Self-pay | Admitting: Gastroenterology

## 2022-06-16 ENCOUNTER — Ambulatory Visit (AMBULATORY_SURGERY_CENTER): Payer: No Typology Code available for payment source | Admitting: Gastroenterology

## 2022-06-16 VITALS — BP 113/77 | HR 58 | Temp 96.2°F | Resp 15 | Ht 69.0 in | Wt 190.0 lb

## 2022-06-16 DIAGNOSIS — K2990 Gastroduodenitis, unspecified, without bleeding: Secondary | ICD-10-CM

## 2022-06-16 DIAGNOSIS — D509 Iron deficiency anemia, unspecified: Secondary | ICD-10-CM

## 2022-06-16 DIAGNOSIS — K219 Gastro-esophageal reflux disease without esophagitis: Secondary | ICD-10-CM | POA: Diagnosis not present

## 2022-06-16 DIAGNOSIS — B9681 Helicobacter pylori [H. pylori] as the cause of diseases classified elsewhere: Secondary | ICD-10-CM | POA: Diagnosis not present

## 2022-06-16 DIAGNOSIS — K2970 Gastritis, unspecified, without bleeding: Secondary | ICD-10-CM

## 2022-06-16 DIAGNOSIS — K259 Gastric ulcer, unspecified as acute or chronic, without hemorrhage or perforation: Secondary | ICD-10-CM | POA: Diagnosis not present

## 2022-06-16 DIAGNOSIS — K2980 Duodenitis without bleeding: Secondary | ICD-10-CM | POA: Diagnosis not present

## 2022-06-16 MED ORDER — SODIUM CHLORIDE 0.9 % IV SOLN: 500.0000 mL | Freq: Once | INTRAVENOUS | Status: DC

## 2022-06-16 NOTE — Op Note (Signed)
[facility] Patient Name: Jonathan Brown Procedure Date: 06/16/2022 11:01 AM MRN: [id number] Endoscopist: Lorin Picket E. Tomasa Rand , MD Age: 71 Referring MD:  Date of Birth: December 01, 1951 Gender: Male Account #: [id number] Procedure:                Colonoscopy Indications:              Iron deficiency anemia Medicines:                Monitored Anesthesia Care Procedure:                Pre-Anesthesia Assessment:                           - Prior to the procedure, a History and Physical                            was performed, and patient medications and                            allergies were reviewed. The patient's tolerance of                            previous anesthesia was also reviewed. The risks                            and benefits of the procedure and the sedation                            options and risks were discussed with the patient.                            All questions were answered, and informed consent                            was obtained. Prior Anticoagulants: The patient has                            taken no previous anticoagulant or antiplatelet                            agents except for aspirin. ASA Grade Assessment:                            III - A patient with severe systemic disease. After                            reviewing the risks and benefits, the patient was                            deemed in satisfactory condition to undergo the                            procedure.  After obtaining informed consent, the colonoscope                            was passed under direct vision. Throughout the                            procedure, the patient's blood pressure, pulse, and                            oxygen saturations were monitored continuously. The                            Olympus [id number]-[id number] (#[id number]) Colonoscope was                            introduced through the anus and advanced to the the                             terminal ileum, with identification of the                            appendiceal orifice and IC valve. The colonoscopy                            was performed without difficulty. The patient                            tolerated the procedure well. The quality of the                            bowel preparation was good. The terminal ileum,                            ileocecal valve, appendiceal orifice, and rectum                            were photographed. The bowel preparation used was                            Sutab via split dose instruction. Scope In: 11:19:32 AM Scope Out: 11:30:33 AM Scope Withdrawal Time: 0 hours 8 minutes 36 seconds  Total Procedure Duration: 0 hours 11 minutes 1 second  Findings:                 The perianal and digital rectal examinations were                            normal. Pertinent negatives include normal                            sphincter tone and no palpable rectal lesions.                           The colon (entire examined portion) appeared normal.  The terminal ileum appeared normal.                           The retroflexed view of the distal rectum and anal                            verge was normal and showed no anal or rectal                            abnormalities. Complications:            No immediate complications. Estimated Blood Loss:     Estimated blood loss: none. Impression:               - The entire examined colon is normal.                           - The examined portion of the ileum was normal.                           - The distal rectum and anal verge are normal on                            retroflexion view.                           - No specimens collected.                           - No abnormalities to explain iron deficiency anemia Recommendation:           - Patient has a contact number available for                            emergencies. The signs and symptoms of  potential                            delayed complications were discussed with the                            patient. Return to normal activities tomorrow.                            Written discharge instructions were provided to the                            patient.                           - Resume previous diet.                           - Continue present medications.                           - Await pathology results from upper endoscopy for  further recommendations.                           - Given patient's age and lack of polyps, I                            recommend against further colon cancer screening. Joannah Gitlin E. Tomasa Rand, MD 06/16/2022 11:42:56 AM This report has been signed electronically.

## 2022-06-16 NOTE — Progress Notes (Signed)
Called to room to assist during endoscopic procedure.  Patient ID and intended procedure confirmed with present staff. Received instructions for my participation in the procedure from the performing physician.  

## 2022-06-16 NOTE — Op Note (Signed)
[facility] Patient Name: Jonathan Brown Procedure Date: 06/16/2022 11:03 AM MRN: [id number] Endoscopist: Lorin Picket E. Tomasa Rand , MD Age: 71 Referring MD:  Date of Birth: [birth year] Gender: Male Account #: [id number] Procedure:                Upper GI endoscopy Indications:              Iron deficiency anemia Medicines:                Monitored Anesthesia Care Procedure:                Pre-Anesthesia Assessment:                           - Prior to the procedure, a History and Physical                            was performed, and patient medications and                            allergies were reviewed. The patient's tolerance of                            previous anesthesia was also reviewed. The risks                            and benefits of the procedure and the sedation                            options and risks were discussed with the patient.                            All questions were answered, and informed consent                            was obtained. Prior Anticoagulants: The patient has                            taken no previous anticoagulant or antiplatelet                            agents except for aspirin. ASA Grade Assessment:                            III - A patient with severe systemic disease. After                            reviewing the risks and benefits, the patient was                            deemed in satisfactory condition to undergo the                            procedure.  After obtaining informed consent, the endoscope was                            passed under direct vision. Throughout the                            procedure, the patient's blood pressure, pulse, and                            oxygen saturations were monitored continuously. The                            GIF [id number] #[id number] was introduced through the                            mouth, and advanced to the third part of duodenum.                             The upper GI endoscopy was accomplished without                            difficulty. The patient tolerated the procedure,                            but coughed constantly throughout the upper                            endoscopy. Findings:                 The examined portions of the nasopharynx,                            oropharynx and larynx were normal.                           The examined esophagus was normal.                           Localized moderate inflammation characterized by                            erythema was found in the gastric antrum. Biopsies                            were taken with a cold forceps for histology.                            Biopsies were taken with a cold forceps for                            Helicobacter pylori testing. Estimated blood loss                            was minimal.  A medium-sized hiatal hernia was present.                           The exam of the stomach was otherwise normal.                           The examined duodenum was normal. Biopsies for                            histology were taken with a cold forceps for                            evaluation of celiac disease. Estimated blood loss                            was minimal. Complications:            No immediate complications. Estimated Blood Loss:     Estimated blood loss was minimal. Impression:               - The examined portions of the nasopharynx,                            oropharynx and larynx were normal.                           - Normal esophagus.                           - Gastritis. Biopsied. This may be secondary to                            irritation from iron tablets, given the focal                            distribution of inflammation.                           - Medium-sized hiatal hernia.                           - Normal examined duodenum. Biopsied.                           - No obvious  causes of iron deficiency anemia. Recommendation:           - Patient has a contact number available for                            emergencies. The signs and symptoms of potential                            delayed complications were discussed with the                            patient. Return to normal activities tomorrow.  Written discharge instructions were provided to the                            patient.                           - Resume previous diet.                           - Continue present medications.                           - Await pathology results Aaniya Sterba E. Tomasa Rand, MD 06/16/2022 11:39:53 AM This report has been signed electronically.

## 2022-06-16 NOTE — Progress Notes (Signed)
[facility] History and Physical   Primary Care Physician:  Carlena Hurl, PA-C   Reason for Procedure:   Iron deficiency anemia  Plan:    EGD/colonoscopy     HPI: Jonathan Brown is a 71 y.o. male undergoing EGD and colonoscopy to evaluate iron deficiency anemia.  He has chronic GERD symptoms which are well controlled with once daily omeprazole, otherwise no chronic GI symptoms and no overt GI blood loss   Past Medical History:  Diagnosis Date   Anemia    Arthritis    Cataract 02/26/2021   Bilateral   CHB (complete heart block) ([place]) 10/05/2017   Diabetes mellitus without complication ([place]) 8101   type 2    Encounter for care of pacemaker 07/20/2019   Former smoker    GERD (gastroesophageal reflux disease)    History of colon polyps    Hyperlipidemia    Hypertension 2012   Open-angle glaucoma 02/26/2021   Pacemaker Medtronic Azure XT DR MRI [id number] Dual chamber pacemaker in situ 10/03/2017   PVD (peripheral vascular disease) ([place])    S/P AVR    Aortic valve replacement using a 21-mm pericardial Magna Ease (09/29/2017 by Dr. 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    wife is able to read to him    Past Surgical History:  Procedure Laterality Date   AORTIC VALVE REPLACEMENT N/A 09/29/2017   Procedure: AORTIC VALVE REPLACEMENT (AVR) using 66m Magna Ease valve;  Surgeon: VIvin Poot MD;  [facility]  Service: Open Heart Surgery;  Laterality: N/A;   CARDIAC CATHETERIZATION  07/12/2017   COLONOSCOPY  2008   LEFT AND RIGHT HEART CATHETERIZATION WITH CORONARY ANGIOGRAM N/A 04/02/2014   Procedure: LEFT AND RIGHT HEART CATHETERIZATION WITH CORONARY ANGIOGRAM;  Surgeon: JLaverda Page MD;  [facility];  Service: Cardiovascular;  Laterality: N/A;   PACEMAKER IMPLANT N/A 10/03/2017   Procedure: PACEMAKER IMPLANT;  Surgeon: TEvans Lance MD;  [facility];  Service: Cardiovascular;  Laterality: N/A;   TEE WITHOUT CARDIOVERSION N/A 04/30/2014   TEE WITHOUT CARDIOVERSION N/A 09/29/2017   Procedure: TRANSESOPHAGEAL ECHOCARDIOGRAM (TEE);  Surgeon: VPrescott Gum PCollier Salina MD;  [facility]  Service: Open Heart Surgery;  Laterality: N/A;    Prior to Admission medications   Medication Sig Start Date End Date Taking? Authorizing Provider  acetaminophen (TYLENOL) 500 MG tablet Take 1,000 mg by mouth in the morning and at bedtime.   Yes [provider]  amLODipine (NORVASC) 10 MG tablet TAKE 1 TABLET BY MOUTH EVERY DAY Patient taking differently: Take 10 mg by mouth daily. 07/29/21  Yes GAdrian Prows MD  aspirin EC 81 MG tablet Take 1 tablet (81 mg total) by mouth daily. 09/07/21  Yes Tysinger, DCamelia Eng PA-C  Blood Glucose Monitoring Suppl DEVI Test twice a day. Dispense one touch meter 05/31/22  Yes Tysinger, DCamelia Eng PA-C  cholecalciferol (VITAMIN D3) 25 MCG (1000 UNIT) tablet Take 1 tablet (1,000 Units total) by mouth daily. 09/18/21  Yes Tysinger, DCamelia Eng PA-C  fenofibrate (TRICOR) 48 MG tablet Take 1 tablet (48 mg total) by mouth daily. 10/30/21  Yes GAdrian Prows MD  ferrous gluconate (FERGON) 324 MG tablet TAKE 1 TABLET (324 MG TOTAL) BY MOUTH 2 (TWO) TIMES DAILY WITH A MEAL. 04/28/22  Yes Tysinger, DCamelia Eng PA-C  glucose blood test strip  Test 1-2 times a day. Pt uses onetouch meter 05/31/22  Yes Tysinger, Camelia Eng, PA-C  JARDIANCE 10 MG TABS tablet TAKE 1 TABLET BY MOUTH EVERY DAY BEFORE BREAKFAST 03/22/22  Yes Tysinger, Camelia Eng, PA-C  Lancets [facility] ULTRASOFT) lancets Test 1-2 times a day. E11.9 05/31/22  Yes Tysinger, Camelia Eng, PA-C  metFORMIN (GLUCOPHAGE) 500 MG tablet TAKE ONE TABLET BY MOUTH TWICE DAILY WITH  MEAL Patient taking differently: Take 500 mg by mouth 2 (two) times daily with a meal. 09/07/21  Yes Tysinger, Camelia Eng, PA-C  metoprolol tartrate (LOPRESSOR) 50 MG tablet Take 1 tablet (50 mg total) by mouth  2 (two) times daily. 09/07/21  Yes Tysinger, Camelia Eng, PA-C  omeprazole (PRILOSEC) 40 MG capsule TAKE 1 CAPSULE BY MOUTH EVERY DAY 04/12/22  Yes Tysinger, Camelia Eng, PA-C  rosuvastatin (CRESTOR) 20 MG tablet TAKE 1 TABLET BY MOUTH EVERYDAY AT BEDTIME Patient taking differently: Take 20 mg by mouth at bedtime. 09/07/21  Yes Tysinger, Camelia Eng, PA-C  Tetrahydrozoline HCl (VISINE OP) Apply 1 drop to eye daily as needed (dry eyes).   Yes [provider]  valsartan (DIOVAN) 160 MG tablet Take 1 tablet (160 mg total) by mouth daily. 06/04/22 06/04/23 Yes Tysinger, Camelia Eng, PA-C  vitamin B-12 (CYANOCOBALAMIN) 50 MCG tablet Take 50 mcg by mouth daily.   Yes [provider]    Current Outpatient Medications  Medication Sig Dispense Refill   acetaminophen (TYLENOL) 500 MG tablet Take 1,000 mg by mouth in the morning and at bedtime.     amLODipine (NORVASC) 10 MG tablet TAKE 1 TABLET BY MOUTH EVERY DAY (Patient taking differently: Take 10 mg by mouth daily.) 90 tablet 3   aspirin EC 81 MG tablet Take 1 tablet (81 mg total) by mouth daily. 90 tablet 3   Blood Glucose Monitoring Suppl DEVI Test twice a day. Dispense one touch meter 1 kit 0   cholecalciferol (VITAMIN D3) 25 MCG (1000 UNIT) tablet Take 1 tablet (1,000 Units total) by mouth daily. 90 tablet 3   fenofibrate (TRICOR) 48 MG tablet Take 1 tablet (48 mg total) by mouth daily. 90 tablet 3   ferrous gluconate (FERGON) 324 MG tablet TAKE 1 TABLET (324 MG TOTAL) BY MOUTH 2 (TWO) TIMES DAILY WITH A MEAL. 180 tablet 1   glucose blood test strip Test 1-2 times a day. Pt uses onetouch meter 100 each 12   JARDIANCE 10 MG TABS tablet TAKE 1 TABLET BY MOUTH EVERY DAY BEFORE BREAKFAST 90 tablet 0   Lancets (ONETOUCH ULTRASOFT) lancets Test 1-2 times a day. E11.9 100 each 12   metFORMIN (GLUCOPHAGE) 500 MG tablet TAKE ONE TABLET BY MOUTH TWICE DAILY WITH  MEAL (Patient taking differently: Take 500 mg by mouth 2 (two) times daily with a meal.) 180 tablet 3    metoprolol tartrate (LOPRESSOR) 50 MG tablet Take 1 tablet (50 mg total) by mouth 2 (two) times daily. 180 tablet 3   omeprazole (PRILOSEC) 40 MG capsule TAKE 1 CAPSULE BY MOUTH EVERY DAY 90 capsule 0   rosuvastatin (CRESTOR) 20 MG tablet TAKE 1 TABLET BY MOUTH EVERYDAY AT BEDTIME (Patient taking differently: Take 20 mg by mouth at bedtime.) 90 tablet 3   Tetrahydrozoline HCl (VISINE OP) Apply 1 drop to eye daily as needed (dry eyes).     valsartan (DIOVAN) 160 MG tablet Take 1 tablet (160 mg total) by mouth daily. 30 tablet 2   vitamin B-12 (CYANOCOBALAMIN) 50 MCG tablet Take 50 mcg by  mouth daily.     Current Facility-Administered Medications  Medication Dose Route Frequency Provider Last Rate Last Admin   0.9 %  sodium chloride infusion  500 mL Intravenous Once Daryel November, MD        Allergies as of 06/16/2022   (No Known Allergies)    Family History  Problem Relation Age of Onset   Other Mother        died of old age   Heart disease Father    Sleep apnea Sister    Diabetes Brother        diabetic coma x 3 years   Alcohol abuse Brother    Heart disease Brother 91       MI   Cancer Neg Hx    Stroke Neg Hx    Rectal cancer Neg Hx    Esophageal cancer Neg Hx    Stomach cancer Neg Hx     Social History   Socioeconomic History   Marital status: Married    Spouse name: Not on file   Number of children: 0   Years of education: Not on file   Highest education level: Not on file  Occupational History   Not on file  Tobacco Use   Smoking status: Former    Packs/day: 0.25    Years: 30.00    Total pack years: 7.50    Types: Cigarettes    Quit date: 09/28/2007    Years since quitting: 14.7   Smokeless tobacco: Never  Vaping Use   Vaping Use: Never used  Substance and Sexual Activity   Alcohol use: Yes    Comment: occasionally   Drug use: Yes    Frequency: 7.0 times per week    Types: Marijuana    Comment: patient says he smokes marajuana once a day    Sexual activity: Yes  Other Topics Concern   Not on file  Social History Narrative   Married, walks for exercise, step- son lives in [place].  05/2019   Social Determinants of Health   Financial Resource Strain: Low Risk  (12/17/2021)   Overall Financial Resource Strain (CARDIA)    Difficulty of Paying Living Expenses: Not hard at all  Food Insecurity: No Food Insecurity (11/13/2021)   Hunger Vital Sign    Worried About Running Out of Food in the Last Year: Never true    [place] in the Last Year: Never true  Transportation Needs: No Transportation Needs (12/17/2021)   PRAPARE - Hydrologist (Medical): No    Lack of Transportation (Non-Medical): No  Physical Activity: Inactive (11/13/2021)   Exercise Vital Sign    Days of Exercise per Week: 0 days    Minutes of Exercise per Session: 0 min  Stress: No Stress Concern Present (11/13/2021)   [place]    Feeling of Stress : Not at all  Social Connections: Not on file  Intimate Partner Violence: Not on file    Review of Systems:  All other review of systems negative except as mentioned in the HPI.  Physical Exam: Vital signs BP (!) 159/82   Pulse 76   Temp (!) 96.2 F (35.7 C)   Ht 5' 9"  (1.753 m)   Wt 190 lb (86.2 kg)   SpO2 98%   BMI 28.06 kg/m   General:   Alert,  Well-developed, well-nourished, pleasant and cooperative in NAD Airway:  Mallampati 2 Lungs:  Clear throughout to auscultation.   Heart:  Regular rate and rhythm; no murmurs, clicks, rubs,  or gallops. Abdomen:  Soft, nontender and nondistended. Normal bowel sounds.   Neuro/Psych:  Normal mood and affect. A and O x 3   Kendra Grissett E. Candis Schatz, MD [facility]

## 2022-06-16 NOTE — Progress Notes (Signed)
Pt non-responsive, VVS, Report to RN  °

## 2022-06-16 NOTE — Patient Instructions (Signed)
YOU HAD AN ENDOSCOPIC PROCEDURE TODAY AT THE [facility]:   Refer to the procedure report that was given to you for any specific questions about what was found during the examination.  If the procedure report does not answer your questions, please call your gastroenterologist to clarify.  If you requested that your care partner not be given the details of your procedure findings, then the procedure report has been included in a sealed envelope for you to review at your convenience later.  YOU SHOULD EXPECT: Some feelings of bloating in the abdomen. Passage of more gas than usual.  Walking can help get rid of the air that was put into your GI tract during the procedure and reduce the bloating. If you had a lower endoscopy (such as a colonoscopy or flexible sigmoidoscopy) you may notice spotting of blood in your stool or on the toilet paper. If you underwent a bowel prep for your procedure, you may not have a normal bowel movement for a few days.  Please Note:  You might notice some irritation and congestion in your nose or some drainage.  This is from the oxygen used during your procedure.  There is no need for concern and it should clear up in a day or so.  SYMPTOMS TO REPORT IMMEDIATELY:  Following lower endoscopy (colonoscopy or flexible sigmoidoscopy):  Excessive amounts of blood in the stool  Significant tenderness or worsening of abdominal pains  Swelling of the abdomen that is new, acute  Fever of 100F or higher  Following upper endoscopy (EGD)  Vomiting of blood or coffee ground material  New chest pain or pain under the shoulder blades  Painful or persistently difficult swallowing  New shortness of breath  Fever of 100F or higher  Black, tarry-looking stools  For urgent or emergent issues, a gastroenterologist can be reached at any hour by calling [phone]. Do not use MyChart messaging for urgent concerns.    DIET:  We do recommend a small meal at first, but  then you may proceed to your regular diet.  Drink plenty of fluids but you should avoid alcoholic beverages for 24 hours.  ACTIVITY:  You should plan to take it easy for the rest of today and you should NOT DRIVE or use heavy machinery until tomorrow (because of the sedation medicines used during the test).    FOLLOW UP: Our staff will call the number listed on your records 24-72 hours following your procedure to check on you and address any questions or concerns that you may have regarding the information given to you following your procedure. If we do not reach you, we will leave a message.  We will attempt to reach you two times.  During this call, we will ask if you have developed any symptoms of COVID 19. If you develop any symptoms (ie: fever, flu-like symptoms, shortness of breath, cough etc.) before then, please call [phone].  If you test positive for Covid 19 in the 2 weeks post procedure, please call and report this information to us.    If any biopsies were taken you will be contacted by phone or by letter within the next 1-3 weeks.  Please call us at [phone] if you have not heard about the biopsies in 3 weeks.    SIGNATURES/CONFIDENTIALITY: You and/or your care partner have signed paperwork which will be entered into your electronic medical record.  These signatures attest to the fact that that the information above on your After   Visit Summary has been reviewed and is understood.  Full responsibility of the confidentiality of this discharge information lies with you and/or your care-partner.  

## 2022-06-16 NOTE — Progress Notes (Signed)
Coughing nduring upper scope.  Suctions sputum tan color throughout procedure.  Jaw thrust also.

## 2022-06-17 ENCOUNTER — Telehealth: Payer: Self-pay

## 2022-06-17 NOTE — Telephone Encounter (Signed)
  Follow up Call-     06/16/2022   10:25 AM  Call back number  Post procedure Call Back phone  # [phone]  Permission to leave phone message Yes     Patient questions:  Do you have a fever, pain , or abdominal swelling? No. Pain Score  0 *  Have you tolerated food without any problems? Yes.    Have you been able to return to your normal activities? Yes.    Do you have any questions about your discharge instructions: Diet   No. Medications  No. Follow up visit  No.  Do you have questions or concerns about your Care? No.  Actions: * If pain score is 4 or above: No action needed, pain <4.

## 2022-06-18 ENCOUNTER — Other Ambulatory Visit: Payer: Self-pay

## 2022-06-18 NOTE — Telephone Encounter (Signed)
Pt need a glucose meter . One touch was placed up front for pick up. KH

## 2022-06-24 ENCOUNTER — Other Ambulatory Visit: Payer: Self-pay

## 2022-06-24 MED ORDER — METRONIDAZOLE 250 MG PO TABS: 250.0000 mg | ORAL_TABLET | Freq: Four times a day (QID) | ORAL | 0 refills | Status: DC

## 2022-06-24 MED ORDER — DOXYCYCLINE HYCLATE 100 MG PO CAPS: 100.0000 mg | ORAL_CAPSULE | Freq: Two times a day (BID) | ORAL | 0 refills | Status: DC

## 2022-06-24 MED ORDER — OMEPRAZOLE 20 MG PO CPDR: DELAYED_RELEASE_CAPSULE | ORAL | 0 refills | Status: DC

## 2022-06-24 MED ORDER — BISMUTH 262 MG PO CHEW: 524.0000 mg | CHEWABLE_TABLET | Freq: Four times a day (QID) | ORAL | 0 refills | Status: DC

## 2022-06-24 NOTE — Progress Notes (Signed)
Jonathan Brown, The biopsies from the recent upper GI Endoscopy were notable for [name].  This can be a cause of iron deficiency.  We will plan on treating with quad therapy as below. Please confirm no medication allergies to the prescribed regimen.   1) Omeprazole 20 mg 2 times a day x 14 d 2) Pepto Bismol 2 tabs (262 mg each) 4 times a day x 14 d 3) Metronidazole 250 mg 4 times a day x 14 d 4) doxycycline 100 mg 2 times a day x 14 d  After 14 days, ok to stop omeprazole.  4 weeks after treatment completed, check H. Pylori stool antigen to confirm eradication (must be off acid suppression therapy for 2 weeks prior to specimen submission)  Dx: Jonathan Brown   Jonathan Brown,  Please call Jonathan Brown and relay the following results/plans. Thank you

## 2022-07-05 ENCOUNTER — Encounter: Payer: Self-pay | Admitting: Medical

## 2022-07-05 ENCOUNTER — Telehealth: Payer: Self-pay

## 2022-07-05 ENCOUNTER — Ambulatory Visit (INDEPENDENT_AMBULATORY_CARE_PROVIDER_SITE_OTHER): Payer: No Typology Code available for payment source | Admitting: Medical

## 2022-07-05 VITALS — BP 130/80 | HR 68 | Wt 187.8 lb

## 2022-07-05 DIAGNOSIS — I10 Essential (primary) hypertension: Secondary | ICD-10-CM | POA: Diagnosis not present

## 2022-07-05 DIAGNOSIS — R7989 Other specified abnormal findings of blood chemistry: Secondary | ICD-10-CM

## 2022-07-05 DIAGNOSIS — E118 Type 2 diabetes mellitus with unspecified complications: Secondary | ICD-10-CM | POA: Diagnosis not present

## 2022-07-05 DIAGNOSIS — Z550 Illiteracy and low-level literacy: Secondary | ICD-10-CM

## 2022-07-05 MED ORDER — MICROLET LANCETS MISC: 1.0000 | Freq: Every day | 5 refills | Status: DC

## 2022-07-05 MED ORDER — GLUCOSE BLOOD VI STRP: 1.0000 | ORAL_STRIP | 5 refills | Status: DC | PRN

## 2022-07-05 NOTE — Progress Notes (Signed)
Subjective    Patient ID: Jonathan Brown, male    DOB: November 05, 1951  Age: 71 y.o. MRN: [id number]  CC:  Chief Complaint  Patient presents with   Follow-up    Follow up on new medication. Needs help with glucose monitor.   I saw him recently for med check.  At that time his creatinine kidney marker showed elevation and GFR was decreased compared to prior readings in the last year.  He stopped Diovan HCT and changed to plain Diovan.  He is taking his medicine without complaint.  He brought all of his pill bottles in today.  He recently saw gastroenterology had an endoscopy and was placed on a medicine regimen for H. pylori.  He has not started that medicine because he had questions about the proper way to take it.  He has a glucometer kit with him but he is not sure how to use his glucometer.  He has been using wife's glucometer recently.  Numbers have been less than 130 fasting  Past Medical History:  Diagnosis Date   Anemia    Arthritis    Cataract 02/26/2021   Bilateral   CHB (complete heart block) ([place]) 10/05/2017   Diabetes mellitus without complication ([place]) 0160   type 2    Encounter for care of pacemaker 07/20/2019   Former smoker    GERD (gastroesophageal reflux disease)    History of colon polyps    Hyperlipidemia    Hypertension 2012   Open-angle glaucoma 02/26/2021   Pacemaker Medtronic Azure XT DR MRI [id number] Dual chamber pacemaker in situ 10/03/2017   PVD (peripheral vascular disease) ([place])    S/P AVR    Aortic valve replacement using a 21-mm pericardial Magna Ease (09/29/2017 by Dr. 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   amLODipine (NORVASC) 10 MG tablet  TAKE 1 TABLET BY MOUTH EVERY DAY (Patient taking differently: Take 10 mg by mouth daily.)   aspirin EC 81 MG tablet Take 1 tablet (81 mg total) by mouth daily.   Blood Glucose Monitoring Suppl DEVI Test twice a day. Dispense one touch meter   cholecalciferol (VITAMIN D3) 25 MCG (1000 UNIT) tablet Take 1 tablet (1,000 Units total) by mouth daily.   doxycycline (VIBRAMYCIN) 100 MG capsule Take 1 capsule (100 mg total) by mouth 2 (two) times daily.   fenofibrate (TRICOR) 48 MG tablet Take 1 tablet (48 mg total) by mouth daily.   ferrous gluconate (FERGON) 324 MG tablet TAKE 1 TABLET (324 MG TOTAL) BY MOUTH 2 (TWO) TIMES DAILY WITH A MEAL.   glucose blood test strip Test 1-2 times a day. Pt uses onetouch meter   JARDIANCE 10 MG TABS tablet TAKE 1 TABLET BY MOUTH EVERY DAY BEFORE BREAKFAST   Lancets (ONETOUCH ULTRASOFT) lancets Test 1-2 times a day. E11.9   metFORMIN (GLUCOPHAGE) 500 MG tablet TAKE ONE TABLET BY MOUTH TWICE DAILY WITH  MEAL (Patient taking differently: Take 500 mg by mouth 2 (two) times daily with a meal.)   metoprolol tartrate (LOPRESSOR) 50 MG tablet Take 1 tablet (50 mg total) by mouth 2 (two) times daily.   metroNIDAZOLE (  FLAGYL) 250 MG tablet Take 1 tablet (250 mg total) by mouth 4 (four) times daily.   omeprazole (PRILOSEC) 20 MG capsule Take 1 tablet before breakfast and before supper. Stop omeprazole 22m prescription while taking this prescription. When finished resume the 476mdose.   rosuvastatin (CRESTOR) 20 MG tablet TAKE 1 TABLET BY MOUTH EVERYDAY AT BEDTIME (Patient taking differently: Take 20 mg by mouth at bedtime.)   valsartan (DIOVAN) 160 MG tablet Take 1 tablet (160 mg total) by mouth daily.   vitamin B-12 (CYANOCOBALAMIN) 50 MCG tablet Take 50 mcg by mouth daily.   Bismuth 262 MG CHEW Chew 524 mg by mouth in the morning, at noon, in the evening, and at bedtime.   Tetrahydrozoline HCl (VISINE OP) Apply 1 drop to eye daily as needed (dry eyes). (Patient not taking:  Reported on 07/05/2022)   [DISCONTINUED] omeprazole (PRILOSEC) 40 MG capsule TAKE 1 CAPSULE BY MOUTH EVERY DAY (Patient not taking: Reported on 07/05/2022)   Facility-Administered Encounter Medications as of 07/05/2022  Medication   0.9 %  sodium chloride infusion      Objective    BP 130/80   Pulse 68   Wt 187 lb 12.8 oz (85.2 kg)   SpO2 95%   BMI 27.73 kg/m   BP Readings from Last 3 Encounters:  07/05/22 130/80  06/16/22 113/77  06/03/22 120/70    Gen: wd, wn, nad    Assessment & Plan:   Hypertension-compliant with Diovan 160 mg.  We changed from Diovan HCT last visit given his increasing creatinine and decreasing GFR short-term.  I assume is related to the diuretic.  Continue good hydration.  If labs same or worse consider renal ultrasound and renal artery ultrasound.  Otherwise I suspect labs will improve  We discussed considering doing weekly pill pack or monthly pill pack but will wrap from the pharmacy which could help ease of use and reduce errors.  Nurse did demonstration with glucometer today.  We discussed testing daily fasting in the morning or anytime he feels he symptoms that could be of concern for hypoglycemia.  Goal between 70-130 readings  [name] was seen today for follow-up.  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

## 2022-07-05 NOTE — Assessment & Plan Note (Signed)
06/03/22 stopped HCT in the Diovan HCT and changed to plain Diovan.   BP at goal.   Lab today to recheck lytes and creatinine.

## 2022-07-05 NOTE — Telephone Encounter (Signed)
refill 

## 2022-07-06 ENCOUNTER — Telehealth: Payer: Self-pay | Admitting: Pharmacist

## 2022-07-06 LAB — BASIC METABOLIC PANEL
BUN/Creatinine Ratio: 11 (ref 10–24)
BUN: 14 mg/dL (ref 8–27)
CO2: 20 mmol/L (ref 20–29)
Calcium: 10.3 mg/dL — ABNORMAL HIGH (ref 8.6–10.2)
Chloride: 106 mmol/L (ref 96–106)
Creatinine, Ser: 1.29 mg/dL — ABNORMAL HIGH (ref 0.76–1.27)
Glucose: 83 mg/dL (ref 70–99)
Potassium: 4.3 mmol/L (ref 3.5–5.2)
Sodium: 142 mmol/L (ref 134–144)
eGFR: 60 mL/min/{1.73_m2} (ref >59)

## 2022-07-06 NOTE — Chronic Care Management (AMB) (Signed)
Chronic Care Management Pharmacy Assistant   Name: Jonathan Brown  MRN: [id number] DOB: 02/21/1951  Reason for Encounter: Disease State   Conditions to be addressed/monitored: DMII  Recent office visits:  07/05/22 Carlena Hurl, PA-C - Patient presented for Essential hypertension and other concerns. Changed Omeprazole. Education/ Demo on use Glucometer given in office  06/03/22 Tysinger, Camelia Eng, PA-C - Patient presented for Type 2 diabetes mellitus with complication without long-term current use of insulin and other concerns. No medication changes.  Recent consult visits:  06/16/22 Daryel November, MD Gertie Fey) - Patient presented for Endoscopy. No medication changes.  Hospital visits:  None in previous 6 months  Medications: Outpatient Encounter Medications as of 07/06/2022  Medication Sig   acetaminophen (TYLENOL) 500 MG tablet Take 1,000 mg by mouth in the morning and at bedtime.   amLODipine (NORVASC) 10 MG tablet TAKE 1 TABLET BY MOUTH EVERY DAY (Patient taking differently: Take 10 mg by mouth daily.)   aspirin EC 81 MG tablet Take 1 tablet (81 mg total) by mouth daily.   Bismuth 262 MG CHEW Chew 524 mg by mouth in the morning, at noon, in the evening, and at bedtime.   cholecalciferol (VITAMIN D3) 25 MCG (1000 UNIT) tablet Take 1 tablet (1,000 Units total) by mouth daily.   doxycycline (VIBRAMYCIN) 100 MG capsule Take 1 capsule (100 mg total) by mouth 2 (two) times daily.   fenofibrate (TRICOR) 48 MG tablet Take 1 tablet (48 mg total) by mouth daily.   ferrous gluconate (FERGON) 324 MG tablet TAKE 1 TABLET (324 MG TOTAL) BY MOUTH 2 (TWO) TIMES DAILY WITH A MEAL.   glucose blood test strip 1 each by Other route as needed for other. Use as instructed to check blood sugars daily   JARDIANCE 10 MG TABS tablet TAKE 1 TABLET BY MOUTH EVERY DAY BEFORE BREAKFAST   metFORMIN (GLUCOPHAGE) 500 MG tablet TAKE ONE TABLET BY MOUTH TWICE DAILY WITH  MEAL (Patient taking  differently: Take 500 mg by mouth 2 (two) times daily with a meal.)   metoprolol tartrate (LOPRESSOR) 50 MG tablet Take 1 tablet (50 mg total) by mouth 2 (two) times daily.   metroNIDAZOLE (FLAGYL) 250 MG tablet Take 1 tablet (250 mg total) by mouth 4 (four) times daily.   Microlet Lancets MISC 1 each by Does not apply route daily. Use to check blood sugars daily   omeprazole (PRILOSEC) 20 MG capsule Take 1 tablet before breakfast and before supper. Stop omeprazole 52m prescription while taking this prescription. When finished resume the 438mdose.   rosuvastatin (CRESTOR) 20 MG tablet TAKE 1 TABLET BY MOUTH EVERYDAY AT BEDTIME (Patient taking differently: Take 20 mg by mouth at bedtime.)   Tetrahydrozoline HCl (VISINE OP) Apply 1 drop to eye daily as needed (dry eyes). (Patient not taking: Reported on 07/05/2022)   valsartan (DIOVAN) 160 MG tablet Take 1 tablet (160 mg total) by mouth daily.   vitamin B-12 (CYANOCOBALAMIN) 50 MCG tablet Take 50 mcg by mouth daily.   Facility-Administered Encounter Medications as of 07/06/2022  Medication   0.9 %  sodium chloride infusion   Recent Relevant Labs: Lab Results  Component Value Date/Time   HGBA1C 6.0 (H) 06/03/2022 11:12 AM   HGBA1C 6.6 (A) 01/27/2022 11:14 AM   HGBA1C 6.4 (H) 09/01/2021 11:53 AM   MICROALBUR 0.2 03/14/2017 11:02 AM   MICROALBUR 1.8 02/03/2016 12:01 AM    Kidney Function Lab Results  Component Value Date/Time   CREATININE  1.29 (H) 07/05/2022 01:46 PM   CREATININE 1.58 (H) 06/03/2022 11:12 AM   CREATININE 1.07 03/14/2017 11:02 AM   CREATININE 1.05 06/23/2016 03:50 PM   GFRNONAA >60 09/28/2021 05:49 AM   GFRAA 63 12/11/2020 10:08 AM    Current antihyperglycemic regimen:  Jardiance 10 mg 1 tablet daily - Appropriate, Query effective, Safe, Accessible Metformin 500 mg 1 tablet twice daily - Appropriate, Query effective, Safe, Accessible What recent interventions/DTPs have been made to improve glycemic control:  Per chart  notes patient received his new glucometer and was shown how to use at most recent appointment in July with PCP 07/15/22 Spoke to wife and she reports he has not used the machine she states he does not know how to use it as of yet she reports she thinks he needs a simpler one and the lancets for it are rather large She further reports that the patients sister has the same model machine and her aide are planning to go over it with him sometime next week. She asked if I would check back next week to see how that went and if needed set something up in the office again for training 07/23/22 Spoke to patients wife she reports they met with someone to assist patient in operating machine but he has not used it as he does not like the lancet that he has as they are very large and painful. She was open to an appointment for further education on the machine with the Clinical Pharmacist. She further reports that he is taking his Jardiance and Metformin and is current on his fills of medication.  Education Appointment made for 07/29/22 in office wife aware to bring all supplies to appointment.  Adherence Review: Is the patient currently on a STATIN medication? Yes Is the patient currently on ACE/ARB medication? Yes Does the patient have >5 day gap between last estimated fill dates? No     Care Gaps: COVID Booster - Overdue Eye Exam - Overdue Zoster Vaccine - Postponed TDAP - Postponed CCM- 8/23 AWV- 11/22 BP- 130/80 07/05/22 Lab Results  Component Value Date   HGBA1C 6.0 (H) 06/03/2022     Star Rating Drugs: Valsartan HCTZ - Last filled 05/15/22 90 [facility]                       Jardiance 10 mg - Last filled 03/23/22 [facility] at [facility] Verified Metformin 500 mg - Last filled 05/29/22 90 [facility] at [facility] Rosuvastatin 20 mg - Last filled 6/323 90 DS at [place] Pharmacist Assistant [phone]

## 2022-07-09 ENCOUNTER — Other Ambulatory Visit: Payer: Self-pay | Admitting: Medical

## 2022-07-09 NOTE — Telephone Encounter (Signed)
Gastro lowered dose to 20mg  back in june

## 2022-07-14 ENCOUNTER — Other Ambulatory Visit: Payer: Self-pay | Admitting: Medical

## 2022-07-15 ENCOUNTER — Other Ambulatory Visit: Payer: Self-pay | Admitting: Gastroenterology

## 2022-07-28 ENCOUNTER — Other Ambulatory Visit: Payer: Self-pay | Admitting: Medical

## 2022-07-28 ENCOUNTER — Telehealth: Payer: Self-pay | Admitting: Pharmacist

## 2022-07-28 DIAGNOSIS — E118 Type 2 diabetes mellitus with unspecified complications: Secondary | ICD-10-CM

## 2022-07-28 NOTE — Progress Notes (Signed)
Referral

## 2022-07-28 NOTE — Chronic Care Management (AMB) (Signed)
Chronic Care Management Pharmacy Assistant   Name: Jonathan Brown  MRN: [id number] DOB: October 30, 1951  07/28/22 APPOINTMENT REMINDER    Patient's wife was reminded to have all medications, supplements and any blood glucose and blood pressure readings available for review with Gaylord Shih, Pharm. D, for office visit on 07/29/22 at 10.    Care Gaps: COVID Booster - Overdue Eye Exam - Overdue Zoster Vaccine - Postponed TDAP - Postponed CCM- 8/23 AWV- 11/22 BP- 130/80 07/05/22 Lab Results  Component Value Date   HGBA1C 6.0 (H) 06/03/2022    Star Rating Drug: Valsartan HCTZ - Last filled 05/15/22 [facility]                       Jardiance 10 mg - Last filled 03/23/22 [facility] Verified Metformin 500 mg - Last filled 05/29/22 [facility] Rosuvastatin 20 mg - Last filled 6/323 90 [facility]     Medications: Outpatient Encounter Medications as of 07/28/2022  Medication Sig   acetaminophen (TYLENOL) 500 MG tablet Take 1,000 mg by mouth in the morning and at bedtime.   amLODipine (NORVASC) 10 MG tablet TAKE 1 TABLET BY MOUTH EVERY DAY (Patient taking differently: Take 10 mg by mouth daily.)   aspirin EC 81 MG tablet Take 1 tablet (81 mg total) by mouth daily.   Bismuth 262 MG CHEW Chew 524 mg by mouth in the morning, at noon, in the evening, and at bedtime.   cholecalciferol (VITAMIN D3) 25 MCG (1000 UNIT) tablet Take 1 tablet (1,000 Units total) by mouth daily.   doxycycline (VIBRAMYCIN) 100 MG capsule Take 1 capsule (100 mg total) by mouth 2 (two) times daily.   fenofibrate (TRICOR) 48 MG tablet Take 1 tablet (48 mg total) by mouth daily.   ferrous gluconate (FERGON) 324 MG tablet TAKE 1 TABLET (324 MG TOTAL) BY MOUTH 2 (TWO) TIMES DAILY WITH A MEAL.   glucose blood test strip 1 each by Other route as needed for other. Use as instructed to check blood sugars daily   JARDIANCE 10 MG TABS tablet TAKE 1 TABLET BY MOUTH EVERY DAY BEFORE BREAKFAST   metFORMIN (GLUCOPHAGE) 500 MG  tablet TAKE ONE TABLET BY MOUTH TWICE DAILY WITH  MEAL (Patient taking differently: Take 500 mg by mouth 2 (two) times daily with a meal.)   metoprolol tartrate (LOPRESSOR) 50 MG tablet Take 1 tablet (50 mg total) by mouth 2 (two) times daily.   metroNIDAZOLE (FLAGYL) 250 MG tablet Take 1 tablet (250 mg total) by mouth 4 (four) times daily.   Microlet Lancets MISC 1 each by Does not apply route daily. Use to check blood sugars daily   omeprazole (PRILOSEC) 20 MG capsule TAKE 1 TABLET BEFORE BREAKFAST AND BEFORE SUPPER. STOP OMEPRAZOLE 40MG  PRESCRIPTION WHILE TAKING THIS PRESCRIPTION. WHEN FINISHED RESUME THE 40MG  DOSE.   rosuvastatin (CRESTOR) 20 MG tablet TAKE 1 TABLET BY MOUTH EVERYDAY AT BEDTIME (Patient taking differently: Take 20 mg by mouth at bedtime.)   Tetrahydrozoline HCl (VISINE OP) Apply 1 drop to eye daily as needed (dry eyes). (Patient not taking: Reported on 07/05/2022)   valsartan (DIOVAN) 160 MG tablet Take 1 tablet (160 mg total) by mouth daily.   vitamin B-12 (CYANOCOBALAMIN) 50 MCG tablet Take 50 mcg by mouth daily.   Facility-Administered Encounter Medications as of 07/28/2022  Medication   0.9 %  sodium chloride infusion       09/05/2022  [place] Clinical Pharmacist Assistant [phone]

## 2022-07-29 ENCOUNTER — Ambulatory Visit (INDEPENDENT_AMBULATORY_CARE_PROVIDER_SITE_OTHER): Payer: No Typology Code available for payment source | Admitting: Pharmacist

## 2022-07-29 DIAGNOSIS — I10 Essential (primary) hypertension: Secondary | ICD-10-CM

## 2022-07-29 DIAGNOSIS — E118 Type 2 diabetes mellitus with unspecified complications: Secondary | ICD-10-CM

## 2022-07-29 NOTE — Progress Notes (Signed)
Chronic Care Management Pharmacy Note  07/29/2022 Name:  Jonathan Brown MRN:  [id number] DOB:  1951/03/06  Summary: Pt cannot afford the lancets for his device Pt is checking BP at home but unsure of readings Pt is not taking omeprazole as recommended  Recommendations/Changes made from today's visit: -Recommended alternating time of day for checking blood sugars -[facility]ed [facility] to clarify on price of lancets and confirmed $0 copay -Recommended bringing BP cuff to next office visit to ensure accuracy -Recommended switching to omeprazole 40 mg once daily as prescribed  Plan: DM assessment in 3 weeks Follow up 4-5 months  Subjective: Jonathan Brown is an 71 y.o. year old male who is a primary patient of Carlena Hurl, PA-C.  The CCM team was consulted for assistance with disease management and care coordination needs.    Engaged with patient face to face for follow up visit in response to provider referral for pharmacy case management and/or care coordination services.   Consent to Services:  The patient was given information about Chronic Care Management services, agreed to services, and gave verbal consent prior to initiation of services.  Please see initial visit note for detailed documentation.   Patient Care Team: Tysinger, Leward Quan as PCP - General (Family Medicine) Viona Gilmore, [facility] as Pharmacist (Pharmacist)  Recent office visits: 07/05/22 Tysinger, Camelia Eng, PA-C - Patient presented for Essential hypertension and other concerns. Changed Omeprazole. Education/ Demo on use Glucometer given in office   06/03/22 Tysinger, Camelia Eng, PA-C - Patient presented for Type 2 diabetes mellitus with complication without long-term current use of insulin and other concerns. No medication changes.  Recent consult visits: 06/16/22 Daryel November, MD Gertie Fey) - Patient presented for colonoscopy and endoscopy. No medication changes.  05/28/22 Adrian Prows, MD (Cardiology) -  Patient presented for CHB and other concerns. Stopped Latanoprost drops.   05/13/22 Clent Ridges) - Patient presented for Iron deficiency anemia unspecified iron deficiency anemia type and other concerns. Prescribed Sodium Sulfate- Mag. Stopped Hydrocortisone 2.5%.   05/07/22 Scronce, Ebbie Latus, RN (Cardio Research) - Patient presented for Research study visit. No other visit details available.   04/19/22 Gardiner Barefoot, DPM (podiatry): Patient presented for nail trim.  01/21/22 Hyman Hopes, MD (Ophthalmology)- Patient presented for Glaucoma Evaluation. No medication changes.  12/16/21 Gardiner Barefoot, DPM (podiatry): Patient presented for nail trim.  11/26/21 Adrian Prows, MD (Cardiology) - Patient presented for Encounter for care of pacemaker and other concerns. No medication changes.   10/26/21 Adrian Prows, MD (Cardiology) - Patient presented for Essential hypertension and other concerns. Prescribed Fenofibrate 48 mg daily.   10/21/21 Hyman Hopes, MD (Ophthalmology) - Patient presented for glaucoma and other concerns. No medication changes.   09/15/21 Bronson Ing, DPM (Podiatry) - Patient presented for Claudication and other concerns.No medication changes.   07/16/21 Hyman Hopes, MD (Ophthalmology) - Patient presented for Pseudophakia of left eye and other concerns. No medication changes.   Hospital visits: Medication Reconciliation was completed by comparing discharge summary, patient's EMR and Pharmacy list, and upon discussion with patient.   Patent presented to [facility] ED on 09/28/21 due to Hiccups and Shortness of breath. Patient was present for 8 hours.   New?Medications Started at [facility] Discharge:?? -started  chlorproMAZINE 25 MG tablet   Medication Changes at Hospital Discharge: -Changed  None   Medications Discontinued at Hospital Discharge: -Stopped  None    Medications that remain the same after  Hospital  Discharge:??  -All other medications will remain the same.       Medication Reconciliation was completed by comparing discharge summary, patient's EMR and Pharmacy list, and upon discussion with patient.   Patent presented to [facility] ED on 09/21/21 due to Opiate overdose. Patient was present for 4 hours.   New?Medications Started at [facility] Discharge:?? -started  None    Medication Changes at Hospital Discharge: -Changed  None   Medications Discontinued at Hospital Discharge: -Stopped  None    Medications that remain the same after Hospital Discharge:??  -All other medications will remain the same.       Patent presented to [facility] ED on 08/28/21 due to Laceration of right lower extremity. Patient was present for 3 hours.   New?Medications Started at [facility] Discharge:?? -started  None    Medication Changes at Hospital Discharge: -Changed  None   Medications Discontinued at Hospital Discharge: -Stopped  None    Medications that remain the same after Hospital Discharge:??  -All other medications will remain the same.     Objective:  Lab Results  Component Value Date   CREATININE 1.29 (H) 07/05/2022   BUN 14 07/05/2022   GFRNONAA >60 09/28/2021   GFRAA 63 12/11/2020   NA 142 07/05/2022   K 4.3 07/05/2022   CALCIUM 10.3 (H) 07/05/2022   CO2 20 07/05/2022   GLUCOSE 83 07/05/2022    Lab Results  Component Value Date/Time   HGBA1C 6.0 (H) 06/03/2022 11:12 AM   HGBA1C 6.6 (A) 01/27/2022 11:14 AM   HGBA1C 6.4 (H) 09/01/2021 11:53 AM   MICROALBUR 0.2 03/14/2017 11:02 AM   MICROALBUR 1.8 02/03/2016 12:01 AM    Last diabetic Eye exam:  Lab Results  Component Value Date/Time   HMDIABEYEEXA No Retinopathy 02/26/2021 12:00 AM    Last diabetic Foot exam: No results found for: "HMDIABFOOTEX"   Lab Results  Component Value Date   CHOL 113 10/29/2021   HDL 29 (L) 10/29/2021   LDLCALC 45 10/29/2021   TRIG 247 (H)  10/29/2021   CHOLHDL 4.0 09/09/2020       Latest Ref Rng & Units 06/03/2022   11:12 AM 01/27/2022   11:23 AM 09/28/2021    5:49 AM  Hepatic Function  Total Protein 6.0 - 8.5 g/dL 7.1  6.9  6.9   Albumin 3.8 - 4.8 g/dL 4.4  4.4  3.8   AST 0 - 40 IU/L _0 ALT 0 - 44 IU/L _1 Alk Phosphatase 44 - 121 IU/L 89  79  60   Total Bilirubin 0.0 - 1.2 mg/dL 0.4  0.3  0.9     Lab Results  Component Value Date/Time   TSH 2.350 09/01/2021 11:53 AM   TSH 2.16 06/14/2017 08:35 AM       Latest Ref Rng & Units 06/03/2022   11:12 AM 01/28/2022    3:59 PM 09/28/2021    6:04 AM  CBC  WBC 3.4 - 10.8 x10E3/uL 6.0  5.7    Hemoglobin 13.0 - 17.7 g/dL 13.9  11.1  12.2   Hematocrit 37.5 - 51.0 % 40.5  37.1  36.0   Platelets 150 - 450 x10E3/uL 168  229      Lab Results  Component Value Date/Time   VD25OH 32.2 09/17/2021 10:58 AM    Clinical ASCVD: Yes  The ASCVD Risk score (Arnett DK, et al., 2019) failed to  calculate for the following reasons:   The valid total cholesterol range is 130 to 320 mg/dL       06/03/2022   10:27 AM 01/27/2022   10:18 AM 11/13/2021    8:30 AM  Depression screen PHQ 2/9  Decreased Interest 0 0 0  Down, Depressed, Hopeless 0 0 0  PHQ - 2 Score 0 0 0     Social History   Tobacco Use  Smoking Status Former   Packs/day: 0.25   Years: 30.00   Total pack years: 7.50   Types: Cigarettes   Quit date: 09/28/2007   Years since quitting: 14.8  Smokeless Tobacco Never   BP Readings from Last 3 Encounters:  07/05/22 130/80  06/16/22 113/77  06/03/22 120/70   Pulse Readings from Last 3 Encounters:  07/05/22 68  06/16/22 (!) 58  06/03/22 70   Wt Readings from Last 3 Encounters:  07/05/22 187 lb 12.8 oz (85.2 kg)  06/16/22 190 lb (86.2 kg)  06/03/22 190 lb 6.4 oz (86.4 kg)   BMI Readings from Last 3 Encounters:  07/05/22 27.73 kg/m  06/16/22 28.06 kg/m  06/03/22 28.12 kg/m    Assessment/Interventions: Review of patient past medical  history, allergies, medications, health status, including review of consultants reports, laboratory and other test data, was performed as part of comprehensive evaluation and provision of chronic care management services.   SDOH:  (Social Determinants of Health) assessments and interventions performed: Yes (last 12/17/21)   SDOH Screenings   Alcohol Screen: Not on file  Depression (PHQ2-9): Low Risk  (06/03/2022)   Depression (PHQ2-9)    PHQ-2 Score: 0  Financial Resource Strain: Low Risk  (12/17/2021)   Overall Financial Resource Strain (CARDIA)    Difficulty of Paying Living Expenses: Not hard at all  Food Insecurity: No Food Insecurity (11/13/2021)   Hunger Vital Sign    Worried About Running Out of Food in the Last Year: Never true    Ran Out of Food in the Last Year: Never true  Housing: Not on file  Physical Activity: Inactive (11/13/2021)   Exercise Vital Sign    Days of Exercise per Week: 0 days    Minutes of Exercise per Session: 0 min  Social Connections: Not on file  Stress: No Stress Concern Present (11/13/2021)   [place]    Feeling of Stress : Not at all  Tobacco Use: Medium Risk (07/05/2022)   Patient History    Smoking Tobacco Use: Former    Smokeless Tobacco Use: Never    Passive Exposure: Not on file  Transportation Needs: No Transportation Needs (12/17/2021)   PRAPARE - Hydrologist (Medical): No    Lack of Transportation (Non-Medical): No    CCM Care Plan  No Known Allergies  Medications Reviewed Today     Reviewed by Viona Gilmore, [facility] (Pharmacist) on 07/29/22 at 1041  Med List Status: <None>   Medication Order Taking? Sig Documenting Provider Last Dose Status Informant  0.9 %  sodium chloride infusion [id number]   Daryel November, MD  Active   acetaminophen (TYLENOL) 500 MG tablet [id number]  Take 1,000 mg by mouth in the morning and at bedtime.  [provider]  Active Multiple Informants  amLODipine (NORVASC) 10 MG tablet [id number]  TAKE 1 TABLET BY MOUTH EVERY DAY  Patient taking differently: Take 10 mg by mouth daily.   Adrian Prows, MD  Active Multiple Informants  aspirin EC 81 MG tablet [id number]  Take 1 tablet (81 mg total) by mouth daily. Tysinger, Camelia Eng, PA-C  Active Multiple Informants  cholecalciferol (VITAMIN D3) 25 MCG (1000 UNIT) tablet [id number]  Take 1 tablet (1,000 Units total) by mouth daily. Tysinger, Camelia Eng, PA-C  Active Multiple Informants  fenofibrate (TRICOR) 48 MG tablet [id number]  Take 1 tablet (48 mg total) by mouth daily. Adrian Prows, MD  Active   ferrous gluconate (FERGON) 324 MG tablet [id number]  TAKE 1 TABLET (324 MG TOTAL) BY MOUTH 2 (TWO) TIMES DAILY WITH A MEAL. Tysinger, Camelia Eng, PA-C  Active   glucose blood test strip [id number]  1 each by Other route as needed for other. Use as instructed to check blood sugars daily Caryl Ada  Active   JARDIANCE 10 MG TABS tablet [id number] Yes TAKE 1 TABLET BY MOUTH EVERY DAY BEFORE BREAKFAST Tysinger, Camelia Eng, PA-C Taking Active   metFORMIN (GLUCOPHAGE) 500 MG tablet [id number]  TAKE ONE TABLET BY MOUTH TWICE DAILY WITH  MEAL  Patient taking differently: Take 500 mg by mouth 2 (two) times daily with a meal.   Tysinger, Camelia Eng, PA-C  Active Multiple Informants  metoprolol tartrate (LOPRESSOR) 50 MG tablet [id number]  Take 1 tablet (50 mg total) by mouth 2 (two) times daily. Tysinger, Camelia Eng, PA-C  Active Multiple Informants  Microlet Lancets MISC [id number]  1 each by Does not apply route daily. Use to check blood sugars daily Caryl Ada  Active   omeprazole (PRILOSEC) 40 MG capsule [id number] Yes Take 40 mg by mouth daily. [provider] Taking Active   rosuvastatin (CRESTOR) 20 MG tablet [id number]  TAKE 1 TABLET BY MOUTH EVERYDAY AT BEDTIME  Patient taking differently: Take 20 mg by mouth at bedtime.   Tysinger, Camelia Eng, PA-C   Active Multiple Informants  Tetrahydrozoline HCl (VISINE OP) [id number]  Apply 1 drop to eye daily as needed (dry eyes).  Patient not taking: Reported on 07/05/2022   [provider]  Active Multiple Informants  valsartan (DIOVAN) 160 MG tablet [id number]  Take 1 tablet (160 mg total) by mouth daily. Tysinger, Camelia Eng, PA-C  Active   vitamin B-12 (CYANOCOBALAMIN) 50 MCG tablet [id number]  Take 50 mcg by mouth daily. [provider]  Active Multiple Informants            Patient Active Problem List   Diagnosis Date Noted   Memory change 01/27/2022   Screen for colon cancer 01/27/2022   Accidental overdose 09/30/2021   Anemia 09/30/2021   Substance abuse ([place]) 09/30/2021   Abnormal lung scan 09/30/2021   Abnormal PFT 09/30/2021   Aortic atherosclerosis ([place]) 09/30/2021   Visit for wound check 09/07/2021   Nail hypertrophy 09/07/2021   Leg laceration, right, sequela 09/01/2021   Fatigue 09/01/2021   Needs flu shot 09/01/2021   Encounter for health maintenance examination in adult 09/09/2020   Hyperlipidemia associated with type 2 diabetes mellitus ([place]) 09/09/2020   Encounter for care of pacemaker 07/20/2019   Medicare annual wellness visit, subsequent 06/18/2019   Advanced directives, counseling/discussion 06/18/2019   PVD (peripheral vascular disease) ([place]) 06/15/2018   CHB (complete heart block) ([place]) 10/05/2017   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 10/03/2017   S/P AVR 09/29/2017   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   Pleural plaque 06/14/2017   Screening for prostate cancer 06/14/2017   Former smoker 06/14/2017   Vaccine counseling 03/14/2017  Poor diet 11/04/2016   Diabetic mononeuropathy associated with type 2 diabetes mellitus ([place]) 02/03/2016   Gastroesophageal reflux disease without esophagitis 02/03/2016   Essential hypertension 02/03/2016   Type 2 diabetes mellitus with complication, without long-term current  use of insulin ([place]) 02/03/2016   Need for prophylactic vaccination and inoculation against influenza 02/03/2016   Atherosclerosis of native artery of both lower extremities with intermittent claudication ([place]) 02/03/2016    Immunization History  Administered Date(s) Administered   Fluad Quad(high Dose 65+) 09/09/2020, 09/01/2021   Influenza, High Dose Seasonal PF 10/19/2018, 11/01/2019, 11/01/2019   Influenza,inj,Quad PF,6+ Mos 02/03/2016, 11/04/2016   PFIZER(Purple Top)SARS-COV-2 Vaccination 02/19/2020, 03/11/2020, 11/28/2020   Pneumococcal Conjugate-13 06/15/2018   Pneumococcal Polysaccharide-23 11/13/2021   Patient reports his wife was admitted to the hospital last night which has been stressful for him. She has issues with getting her oxygen up and she had a low level which is why she was admitted. Patient wants his wife to focus on herself and healing rather than on him.  Patient reports he is still taking antibiotics from when there were prescribed at the end of June. He is also taking omeprazole twice daily as well, despite only supposed to be increasing dose x 2 weeks.   Conditions to be addressed/monitored:  Hypertension, Hyperlipidemia, Diabetes, GERD, and Anemia  Conditions addressed this visit: Hypertension, diabetes, GERD  Care Plan : CCM Pharmacy Care Plan  Updates made by Viona Gilmore, [place] since 07/29/2022 12:00 AM     Problem: Problem: Hypertension, Hyperlipidemia, Diabetes, and GERD      Long-Range Goal: Patient-Specific Goal   Start Date: 12/17/2021  Expected End Date: 12/17/2022  Recent Progress: On track  Priority: High  Note:   Current Barriers:  Unable to independently monitor therapeutic efficacy Unable to achieve control of triglycerides  Unable to self administer medications as prescribed  Pharmacist Clinical Goal(s):  Patient will achieve adherence to monitoring guidelines and medication adherence to achieve therapeutic efficacy achieve  control of triglycerides as evidenced by next lipid panel  through collaboration with PharmD and provider.   Interventions: 1:1 collaboration with Tysinger, Camelia Eng, PA-C regarding development and update of comprehensive plan of care as evidenced by provider attestation and co-signature Inter-disciplinary care team collaboration (see longitudinal plan of care) Comprehensive medication review performed; medication list updated in electronic medical record  Hypertension (BP goal <130/80) -Controlled -Current treatment: Amlodipine 10 mg 1 tablet daily - Appropriate, Effective, Safe, Accessible Valsartan-HCTZ 160-12.5 mg 1 tablet daily - Appropriate, Effective, Safe, Accessible Metoprolol tartrate 50 mg 1 tablet twice daily - Appropriate, Effective, Safe, Accessible -Medications previously tried: n/a  -Current home readings:  unsure of readings (wife checks it every other day) - arm cuff, never brought it into office  -Current dietary habits: eating a lot of porkchops, a little bit of beef, some chicken (more than beef), fish every Friday; wife does cook with salt; he doesn't add much afterwards; a lot of canned product - uses low sodium; frozen vegetables -Current exercise habits: no formal exercise; walking and mowing the yard - could go to the [facility] without her (wants to stay with wife) -Denies hypotensive/hypertensive symptoms -Educated on BP goals and benefits of medications for prevention of heart attack, stroke and kidney damage; Importance of home blood pressure monitoring; Proper BP monitoring technique; Symptoms of hypotension and importance of maintaining adequate hydration; -Counseled to monitor BP at home weekly, document, and provide log at future appointments -Counseled on diet and exercise extensively Recommended to continue  current medication  Hyperlipidemia: (LDL goal < 70; Triglycerides < 150) -Uncontrolled -Current treatment: Rosuvastatin 20 mg 1 tablet daily -  Appropriate, Query effective, Safe, Accessible Fenofibrate 48 mg 1 tablet daily - Appropriate, Query effective, Safe, Accessible -Medications previously tried: none  -Current dietary patterns: eating fried foods often and drinking pepsi daily -Current exercise habits: no formal exercise -Educated on Cholesterol goals;  Benefits of statin for ASCVD risk reduction; Importance of limiting foods high in cholesterol; Exercise goal of 150 minutes per week; -Counseled on diet and exercise extensively Recommended to continue current medication  PVD/Atherosclerosis  (Goal: prevent heart events) -Controlled -Current treatment  Aspirin 81 mg 1 tablet daily - Appropriate, Effective, Safe, Accessible Rosuvastatin 20 mg 1 tablet daily - Appropriate, Effective, Safe, Accessible -Medications previously tried: none  -Recommended to continue current medication Counseled on benefits of aspirin therapy and avoiding use of other NSAIDs.  Diabetes (A1c goal <7%) -Controlled -Current medications: Jardiance 10 mg 1 tablet daily - Appropriate, Effective, Safe, Accessible Metformin 500 mg 1 tablet twice daily - Appropriate, Effective, Safe, Accessible -Medications previously tried: n/a  -Current home glucose readings fasting glucose: 118 (today); wife is checking at home but not often due to difficulty with new machine post prandial glucose: n/a -Denies hypoglycemic/hyperglycemic symptoms -Current meal patterns:  breakfast: eggs, grits, toast and wife drinks orange juice, black coffee  lunch: eating out almost every day - hamburger and french fries yesterday  dinner: green beans, green peas, porkchops snacks: little debbie's cookies drinks: regular pepsi and water (more pepsi) -Current exercise: no formal exercise -Educated on A1c and blood sugar goals; Exercise goal of 150 minutes per week; Benefits of routine self-monitoring of blood sugar; Carbohydrate counting and/or plate method -Counseled to  check feet daily and get yearly eye exams -Counseled on diet and exercise extensively Recommended to continue current medication Recommended drinking Pepsi every other day and adding in more water. Recommended alternating times of day for checking blood sugars.  GERD (Goal: minimize symptoms) -Controlled -Current treatment  Omeprazole 40 mg 1 capsule daily - Query Appropriate, Effective, Safe, Accessible -Medications previously tried: tums  -Counseled on avoiding eating late and night and elevating his head in bed. Recommended taking 40 mg capsules once daily as prescribed.  Anemia (Goal: Ferritin > 30 , HgB > 11) -Controlled -Current treatment  Ferrous gluconate 324 mg 1 tablet twice daily with a meal - Appropriate, Effective, Safe, Accessible -Medications previously tried: none  -Recommended to continue current medication  Health Maintenance -Vaccine gaps: tetanus, shingrix, COVID booster -Current therapy:  Latanoprost 0.005% 1 drop in both eyes at bedtime Visine as needed Acetaminophen 500 mg as needed - 2 tablets twice daily  Vitamin D 1000 units daily Vitamin B12 50 mcg -Educated on Herbal supplement research is limited and benefits usually cannot be proven Cost vs benefit of each product must be carefully weighed by individual consumer Supplements may interfere with prescription drugs -Patient is satisfied with current therapy and denies issues -Recommended restarting vitamin B12 due to neuropathy.  Patient Goals/Self-Care Activities Patient will:  - take medications as prescribed as evidenced by patient report and record review focus on medication adherence by setting an alarm to take medications check glucose a few times a week, document, and provide at future appointments check blood pressure weekly, document, and provide at future appointments target a minimum of 150 minutes of moderate intensity exercise weekly engage in dietary modifications by limiting sweet  intake and soda consumption  Follow Up Plan: Face to  Face appointment with care management team member scheduled for:  4 months        Medication Assistance: None required.  Patient affirms current coverage meets needs.  Compliance/Adherence/Medication fill history: Care Gaps: Shingrix, tetanus, COVID booster (wants to hold off on it), eye exam, influenza vaccine BP- 130/80 07/05/22 A1c - 6.0% 06/03/22  Star-Rating Drugs: Valsartan HCTZ - [facility]                       Jardiance 10 mg - [facility]14/[facility]  Metformin 500 mg - Last [facility]/29/[facility] Rosuvastatin 20 mg - [facility] 6/323 [facility]  [id number]: [phone]: [phone] Uses pill box? No - doesn't need Pt endorses 90% compliance - once a week (sometimes both are easy to forget) - getting busy and forgets   We discussed: Benefits of medication synchronization, packaging and delivery as well as enhanced pharmacist oversight with [employer]. Patient decided to: Continue current medication management strategy  Care Plan and Follow Up Patient Decision:  Patient agrees to Care Plan and Follow-up.  Plan: Face to Face appointment with care management team member scheduled for: 4 months  MJeni Salles PharmD, [phone]

## 2022-08-04 ENCOUNTER — Other Ambulatory Visit: Payer: Self-pay | Admitting: Gastroenterology

## 2022-08-06 ENCOUNTER — Other Ambulatory Visit: Payer: Self-pay | Admitting: Cardiology

## 2022-08-06 DIAGNOSIS — I10 Essential (primary) hypertension: Secondary | ICD-10-CM

## 2022-08-12 ENCOUNTER — Other Ambulatory Visit: Payer: Self-pay

## 2022-08-12 DIAGNOSIS — A048 Other specified bacterial intestinal infections: Secondary | ICD-10-CM

## 2022-08-12 DIAGNOSIS — I442 Atrioventricular block, complete: Secondary | ICD-10-CM | POA: Diagnosis not present

## 2022-08-12 DIAGNOSIS — Z45018 Encounter for adjustment and management of other part of cardiac pacemaker: Secondary | ICD-10-CM | POA: Diagnosis not present

## 2022-08-16 ENCOUNTER — Ambulatory Visit (INDEPENDENT_AMBULATORY_CARE_PROVIDER_SITE_OTHER): Payer: No Typology Code available for payment source | Admitting: Podiatry

## 2022-08-16 ENCOUNTER — Encounter: Payer: Self-pay | Admitting: Podiatry

## 2022-08-16 DIAGNOSIS — B351 Tinea unguium: Secondary | ICD-10-CM

## 2022-08-16 DIAGNOSIS — I739 Peripheral vascular disease, unspecified: Secondary | ICD-10-CM | POA: Diagnosis not present

## 2022-08-16 DIAGNOSIS — E1141 Type 2 diabetes mellitus with diabetic mononeuropathy: Secondary | ICD-10-CM | POA: Diagnosis not present

## 2022-08-16 DIAGNOSIS — M79609 Pain in unspecified limb: Secondary | ICD-10-CM | POA: Diagnosis not present

## 2022-08-16 NOTE — Progress Notes (Signed)
This patient returns to my office for at risk foot care.  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  This patient is unable to cut nails himself since the patient cannot reach his nails.These nails are painful walking and wearing shoes.  This patient presents for at risk foot care today.  General Appearance  Alert, conversant and in no acute stress.  Vascular  Dorsalis pedis and posterior tibial  pulses are palpable  bilaterally.  Capillary return is within normal limits  bilaterally. Temperature is within normal limits  bilaterally.  Neurologic  Senn-Weinstein monofilament wire test within normal limits  bilaterally. Muscle power within normal limits bilaterally.  Nails Thick disfigured discolored nails with subungual debris  from hallux to fifth toes bilaterally. No evidence of bacterial infection or drainage bilaterally.  Orthopedic  No limitations of motion  feet .  No crepitus or effusions noted.  No bony pathology or digital deformities noted.  Skin  normotropic skin with no porokeratosis noted bilaterally.  No signs of infections or ulcers noted.     Onychomycosis  Pain in right toes  Pain in left toes  Consent was obtained for treatment procedures.   Mechanical debridement of nails 1-5  bilaterally performed with a nail nipper.  Filed with dremel without incident.    Return office visit   4  months                   Told patient to return for periodic foot care and evaluation due to potential at risk complications.   Jennyfer Nickolson DPM  

## 2022-08-18 ENCOUNTER — Telehealth: Payer: Self-pay | Admitting: Pharmacist

## 2022-08-18 NOTE — Chronic Care Management (AMB) (Unsigned)
Chronic Care Management Pharmacy Assistant   Name: Jonathan Brown  MRN: [id number] DOB: 1951-07-27  Reason for Encounter: Disease State   Conditions to be addressed/monitored: DMII  Recent office visits:  None  Recent consult visits:  08/16/22 Helane Gunther, DPM (Podiatry) - Patient presented for Pain due to onychomycosis of nail and other concerns. No medication changes.  Hospital visits:  None in previous 6 months  Medications: Outpatient Encounter Medications as of 08/18/2022  Medication Sig   acetaminophen (TYLENOL) 500 MG tablet Take 1,000 mg by mouth in the morning and at bedtime.   amLODipine (NORVASC) 10 MG tablet TAKE 1 TABLET BY MOUTH EVERY DAY   aspirin EC 81 MG tablet Take 1 tablet (81 mg total) by mouth daily.   cholecalciferol (VITAMIN D3) 25 MCG (1000 UNIT) tablet Take 1 tablet (1,000 Units total) by mouth daily.   fenofibrate (TRICOR) 48 MG tablet Take 1 tablet (48 mg total) by mouth daily.   ferrous gluconate (FERGON) 324 MG tablet TAKE 1 TABLET (324 MG TOTAL) BY MOUTH 2 (TWO) TIMES DAILY WITH A MEAL.   glucose blood test strip 1 each by Other route as needed for other. Use as instructed to check blood sugars daily   JARDIANCE 10 MG TABS tablet TAKE 1 TABLET BY MOUTH EVERY DAY BEFORE BREAKFAST   metFORMIN (GLUCOPHAGE) 500 MG tablet TAKE ONE TABLET BY MOUTH TWICE DAILY WITH  MEAL (Patient taking differently: Take 500 mg by mouth 2 (two) times daily with a meal.)   metoprolol tartrate (LOPRESSOR) 50 MG tablet Take 1 tablet (50 mg total) by mouth 2 (two) times daily.   Microlet Lancets MISC 1 each by Does not apply route daily. Use to check blood sugars daily   omeprazole (PRILOSEC) 40 MG capsule Take 40 mg by mouth daily.   rosuvastatin (CRESTOR) 20 MG tablet TAKE 1 TABLET BY MOUTH EVERYDAY AT BEDTIME (Patient taking differently: Take 20 mg by mouth at bedtime.)   Tetrahydrozoline HCl (VISINE OP) Apply 1 drop to eye daily as needed (dry eyes).   valsartan  (DIOVAN) 160 MG tablet Take 1 tablet (160 mg total) by mouth daily.   vitamin B-12 (CYANOCOBALAMIN) 50 MCG tablet Take 50 mcg by mouth daily.   Facility-Administered Encounter Medications as of 08/18/2022  Medication   0.9 %  sodium chloride infusion   Notes: Call to patient per MP to follow up and see if further education on glucometer is needed and how its been going as wife was in hospital at time of prior appointment. Spoke to patient's wife she reports they have not been checking as he does not like the lancets they are painful. She inquired if he may be given a verio meter with pen needle that is more comfortable. Advised per MP that patients insurance will no longer cover that meter. Wife was in agreement.  She reports she used her meter for him on today and his reading was 135 but he had had something to drink prior and a big dinner last night. She at this time is not interested in further education on the meter,she reports its the lancets that are just large and uncomfortable for him.  Care Gaps: COVID Booster - Overdue Eye Exam - Overdue Flu Vaccine - Overdue Zoster Vaccine - Postponed TDAP - Postponed BP- 130/80 07/05/22 AWV-  11/22 Lab Results  Component Value Date   HGBA1C 6.0 (H) 06/03/2022    Star Rating Drugs: Valsartan HCTZ - Last filled 08/14/22 90 DS  at [facility]                       Jardiance 10 mg - Last filled 07/14/22 90 DS at [facility]  Metformin 500 mg - Last filled 05/29/22 90 [facility] at [facility] Rosuvastatin 20 mg - Last filled 6/323 90 DS at [facility]   Pamala Duffel [facility] Clinical Pharmacist Assistant [phone]

## 2022-08-26 DIAGNOSIS — D649 Anemia, unspecified: Secondary | ICD-10-CM | POA: Diagnosis not present

## 2022-08-26 DIAGNOSIS — Z7984 Long term (current) use of oral hypoglycemic drugs: Secondary | ICD-10-CM | POA: Diagnosis not present

## 2022-08-26 DIAGNOSIS — I119 Hypertensive heart disease without heart failure: Secondary | ICD-10-CM

## 2022-08-26 DIAGNOSIS — I739 Peripheral vascular disease, unspecified: Secondary | ICD-10-CM | POA: Diagnosis not present

## 2022-08-26 DIAGNOSIS — E118 Type 2 diabetes mellitus with unspecified complications: Secondary | ICD-10-CM | POA: Diagnosis not present

## 2022-08-30 ENCOUNTER — Other Ambulatory Visit: Payer: Self-pay | Admitting: Medical

## 2022-09-01 ENCOUNTER — Other Ambulatory Visit: Payer: Self-pay | Admitting: Gastroenterology

## 2022-09-19 ENCOUNTER — Other Ambulatory Visit: Payer: Self-pay | Admitting: Medical

## 2022-09-27 ENCOUNTER — Encounter: Payer: Self-pay | Admitting: *Deleted

## 2022-09-27 NOTE — Progress Notes (Signed)
[facility] Quality Team Note  Name: Jonathan Brown Date of Birth: [birth year] MRN: [id number] Date: 09/27/2022  [facility] Quality Team has reviewed this patient's chart, please see recommendations below:  AWV;  [facility] Quality Other; (Pt has open gaps for AWV and Urine Albumin Creatinine Ratio Test.  Pt has upcoming appt on 10/04/2022.  Can you see if provider can order urine lab test please?)

## 2022-09-28 NOTE — Progress Notes (Signed)
Please see [facility] note.

## 2022-10-04 ENCOUNTER — Ambulatory Visit (INDEPENDENT_AMBULATORY_CARE_PROVIDER_SITE_OTHER): Payer: No Typology Code available for payment source | Admitting: Medical

## 2022-10-04 VITALS — BP 120/80 | Wt 190.0 lb

## 2022-10-04 DIAGNOSIS — E785 Hyperlipidemia, unspecified: Secondary | ICD-10-CM

## 2022-10-04 DIAGNOSIS — R0602 Shortness of breath: Secondary | ICD-10-CM | POA: Diagnosis not present

## 2022-10-04 DIAGNOSIS — I700 Atherosclerosis of aorta: Secondary | ICD-10-CM | POA: Diagnosis not present

## 2022-10-04 DIAGNOSIS — Z952 Presence of prosthetic heart valve: Secondary | ICD-10-CM

## 2022-10-04 DIAGNOSIS — I70213 Atherosclerosis of native arteries of extremities with intermittent claudication, bilateral legs: Secondary | ICD-10-CM

## 2022-10-04 DIAGNOSIS — E118 Type 2 diabetes mellitus with unspecified complications: Secondary | ICD-10-CM

## 2022-10-04 DIAGNOSIS — I739 Peripheral vascular disease, unspecified: Secondary | ICD-10-CM

## 2022-10-04 DIAGNOSIS — R0989 Other specified symptoms and signs involving the circulatory and respiratory systems: Secondary | ICD-10-CM | POA: Diagnosis not present

## 2022-10-04 DIAGNOSIS — I10 Essential (primary) hypertension: Secondary | ICD-10-CM

## 2022-10-04 DIAGNOSIS — Z7185 Encounter for immunization safety counseling: Secondary | ICD-10-CM

## 2022-10-04 DIAGNOSIS — E1169 Type 2 diabetes mellitus with other specified complication: Secondary | ICD-10-CM

## 2022-10-04 DIAGNOSIS — Z23 Encounter for immunization: Secondary | ICD-10-CM

## 2022-10-04 LAB — POCT GLYCOSYLATED HEMOGLOBIN (HGB A1C): Hemoglobin A1C: 5.7 % — AB (ref 4.0–5.6)

## 2022-10-04 NOTE — Patient Instructions (Addendum)
You are due for Shingles vaccine, Tetanus Tdap vaccine and the new covid vaccine.  Please check with your pharmacy about getting these vaccines updated there.  Schedule to see your eye doctor soon as you are due for your yearly diabetic eye exam

## 2022-10-04 NOTE — Progress Notes (Signed)
Subjective:  Jonathan Brown is a 71 y.o. male who presents for Chief Complaint  Patient presents with   Diabetes    Diabetes, wants flu shot today., no concerns      Here for med check  Diabetes - does check sugars with his wife.  (He doesn't read).   Been seeing good numbers fasting, 120-130s.   No foot concerns.  Compliant with metformin and Jardiance  Compliant with medication in general regarding blood pressure, cholesterol  When discussing review of systems he does note shortness of breath in recent weeks even with some minimal activity.  No significant chest pain palpitations or syncope.  No edema.  We had stopped his fluid pill months ago due to abnormal kidney function and he denies any swelling in general  No other aggravating or relieving factors.    No other c/o.  Past Medical History:  Diagnosis Date   Anemia    Arthritis    Cataract 02/26/2021   Bilateral   CHB (complete heart block) (HCC) 10/05/2017   Diabetes mellitus without complication (HCC) 2014   type 2    Encounter for care of pacemaker 07/20/2019   Former smoker    GERD (gastroesophageal reflux disease)    History of colon polyps    Hyperlipidemia    Hypertension 2012   Open-angle glaucoma 02/26/2021   Pacemaker Medtronic Azure XT DR MRI [id number] Dual chamber pacemaker in situ 10/03/2017   PVD (peripheral vascular disease) (HCC)    S/P AVR    Aortic valve replacement using a 21-mm pericardial Magna Ease (09/29/2017 by Dr. 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     amLODipine (NORVASC) 10 MG tablet TAKE 1 TABLET BY MOUTH EVERY DAY 90 tablet 3   aspirin EC 81 MG tablet Take 1  tablet (81 mg total) by mouth daily. 90 tablet 3   cholecalciferol (VITAMIN D3) 25 MCG (1000 UNIT) tablet TAKE 1 TABLET BY MOUTH EVERY DAY 90 tablet 0   fenofibrate (TRICOR) 48 MG tablet Take 1 tablet (48 mg total) by mouth daily. 90 tablet 3   ferrous gluconate (FERGON) 324 MG tablet TAKE 1 TABLET (324 MG TOTAL) BY MOUTH 2 (TWO) TIMES DAILY WITH A MEAL. 180 tablet 1   glucose blood test strip 1 each by Other route as needed for other. Use as instructed to check blood sugars daily 100 each 5   JARDIANCE 10 MG TABS tablet TAKE 1 TABLET BY MOUTH EVERY DAY BEFORE BREAKFAST 90 tablet 0   metFORMIN (GLUCOPHAGE) 500 MG tablet TAKE ONE TABLET BY MOUTH TWICE DAILY WITH  MEAL (Patient taking differently: Take 500 mg by mouth 2 (two) times daily with a meal.) 180 tablet 3   metoprolol tartrate (LOPRESSOR) 50 MG tablet Take 1 tablet (50 mg total) by mouth 2 (two) times daily. 180 tablet 3   Microlet Lancets MISC 1 each by Does not apply route daily. Use to check blood sugars daily 100 each 5   omeprazole (PRILOSEC) 40 MG capsule Take 40 mg by mouth daily.     rosuvastatin (CRESTOR) 20 MG tablet TAKE 1 TABLET BY MOUTH  EVERYDAY AT BEDTIME (Patient taking differently: Take 20 mg by mouth at bedtime.) 90 tablet 3   valsartan (DIOVAN) 160 MG tablet TAKE 1 TABLET BY MOUTH EVERY DAY 90 tablet 0   vitamin B-12 (CYANOCOBALAMIN) 50 MCG tablet Take 50 mcg by mouth daily.     No current facility-administered medications on file prior to visit.    The following portions of the patient's history were reviewed and updated as appropriate: allergies, current medications, past family history, past medical history, past social history, past surgical history and problem list.  ROS Otherwise as in subjective above    Objective: BP 120/80   Wt 190 lb (86.2 kg)   BMI 28.06 kg/m   Wt Readings from Last 3 Encounters:  10/04/22 190 lb (86.2 kg)  07/05/22 187 lb 12.8 oz (85.2 kg)  06/16/22 190 lb (86.2 kg)    General  appearance: alert, no distress, well developed, well nourished Neck: supple, no lymphadenopathy, no thyromegaly, no masses, no JVD or bruits Heart: 2 out of 6 murmur heard in upper sternal borders, otherwise RRR, normal S1, S2 Lungs: CTA bilaterally, no wheezes, rhonchi, or rales Pulses: 2+ radial pulses, 1+ pedal pulses, normal cap refill Ext: no edema   Assessment: Encounter Diagnoses  Name Primary?   Type 2 diabetes mellitus with complication, without long-term current use of insulin (HCC) Yes   Needs flu shot    Vaccine counseling    PVD (peripheral vascular disease) ([place])    Essential hypertension    Hyperlipidemia associated with type 2 diabetes mellitus ([place])    Atherosclerosis of native artery of both lower extremities with intermittent claudication (HCC)    Aortic atherosclerosis (HCC)    S/P AVR    SOB (shortness of breath)    Decreased pedal pulses      Plan: Diabetes-hemoglobin A1c lab today.  Home readings have been normal.  Continue current medications, continue glucose monitoring  Decreased pulses in feet-he has screening test ordered in the chart history but he denies getting a call about the appointment.  I will reach out to his cardiologist about getting this test soon  Given his shortness of breath, history of status post AVR and history of atherosclerosis hypertension hyperlipidemia, I will reach out to cardiology to get him back in for reveal.  He apparently had echocardiogram and lower extremity arterial test ordered in June but the patient notes that he does not have any appointment scheduled for these.  Follow-up with cardiology soon  Hypertension, atherosclerosis, hyperlipidemia associate with diabetes-continue current medications for lipids and blood pressure  I reviewed his labs from last visit.   Counseled on the influenza virus vaccine.  Vaccine information sheet given.   High dose Influenza vaccine given after consent obtained.  Patient Instructions   You are due for Shingles vaccine, Tetanus Tdap vaccine and the new covid vaccine.  Please check with your pharmacy about getting these vaccines updated there.  Schedule to see your eye doctor soon as you are due for your yearly diabetic eye exam   Gorje was seen today for diabetes.  Diagnoses and all orders for this visit:  Type 2 diabetes mellitus with complication, without long-term current use of insulin (HCC) -     Microalbumin/Creatinine Ratio, Urine -     HgB A1c  Needs flu shot -     Flu Vaccine QUAD High Dose(Fluad)  Vaccine counseling  PVD (peripheral vascular disease) ([place])  Essential hypertension  Hyperlipidemia associated with type 2 diabetes mellitus ([place])  Atherosclerosis of native artery of both lower extremities with intermittent claudication (HCC)  Aortic atherosclerosis (HCC)  S/P AVR  SOB (shortness of breath)  Decreased pedal pulses    Follow up: 4 mo

## 2022-10-05 LAB — MICROALBUMIN / CREATININE URINE RATIO
Creatinine, Urine: 55.6 mg/dL
Microalb/Creat Ratio: <5 mg/g creat (ref 0–29)
Microalbumin, Urine: <3 ug/mL

## 2022-10-10 ENCOUNTER — Other Ambulatory Visit: Payer: Self-pay | Admitting: Medical

## 2022-11-09 ENCOUNTER — Other Ambulatory Visit: Payer: Self-pay | Admitting: Gastroenterology

## 2022-11-11 ENCOUNTER — Telehealth: Payer: Self-pay

## 2022-11-11 ENCOUNTER — Other Ambulatory Visit: Payer: Self-pay | Admitting: Medical

## 2022-11-11 DIAGNOSIS — I442 Atrioventricular block, complete: Secondary | ICD-10-CM | POA: Diagnosis not present

## 2022-11-11 DIAGNOSIS — I10 Essential (primary) hypertension: Secondary | ICD-10-CM

## 2022-11-11 DIAGNOSIS — Z45018 Encounter for adjustment and management of other part of cardiac pacemaker: Secondary | ICD-10-CM | POA: Diagnosis not present

## 2022-11-11 NOTE — Telephone Encounter (Signed)
Called patient to follow up as GI did not get in touch with him about omeprazole. Patient answered and is now aware to stop the omeprazole and take Pepcid OTC. Will remove omeprazole from medication list.

## 2022-11-11 NOTE — Telephone Encounter (Signed)
Left VM for patient to return call regarding the medication Omeprazole.

## 2022-11-11 NOTE — Addendum Note (Signed)
Addended by: Verner Chol on: 11/11/2022 02:45 PM   Modules accepted: Orders

## 2022-11-12 ENCOUNTER — Other Ambulatory Visit: Payer: Self-pay | Admitting: Cardiology

## 2022-11-12 DIAGNOSIS — E782 Mixed hyperlipidemia: Secondary | ICD-10-CM

## 2022-11-24 ENCOUNTER — Ambulatory Visit: Payer: HMO

## 2022-11-24 DIAGNOSIS — I70213 Atherosclerosis of native arteries of extremities with intermittent claudication, bilateral legs: Secondary | ICD-10-CM | POA: Diagnosis not present

## 2022-11-24 DIAGNOSIS — Z952 Presence of prosthetic heart valve: Secondary | ICD-10-CM | POA: Diagnosis not present

## 2022-11-24 DIAGNOSIS — I700 Atherosclerosis of aorta: Secondary | ICD-10-CM | POA: Diagnosis not present

## 2022-11-24 NOTE — Progress Notes (Unsigned)
Chief Complaint  Patient presents with   Pacemaker Check   Encounter for care of pacemaker  Pacemaker Medtronic Azure XT DR MRI [id number] Dual chamber pacemaker in situ  CHB (complete heart block) (HCC)  Scheduled  In office pacemaker check 11/25/22  Single (S)/Dual (D)/BV: D. Presenting ASVP @ 60/min. Pacemaker dependant:  Yes. Underlying No V escape. AP 18.6%, VP 100%.  AMS Episodes 6.  AT/AF burden <1% . Brief AT HVR 0  Longevity 4.4 Years. Magnet rate: >85%. Lead measurements: Stable. Histogram: Low (L)/normal (N)/high (H)  Normal. Patient activity Active.   Observations: Normal function. Changes: RV output decreased from 2.5V at 36mS to 2V at 0.75mS to improve battery life.  Dual-chamber pacemaker transmission 11/11/2022: AP 23%, VP 100%.  Longevity 3 years and 10 months.  Lead impedance and thresholds are normal.  Brief AT. There were no high ventricular rate episodes. Normal pacemaker function.   Yates Decamp, MD, [facility] 11/25/2022, 1:53 PM Office: [phone] Fax: [phone] Pager: [phone]

## 2022-11-25 ENCOUNTER — Other Ambulatory Visit: Payer: Self-pay

## 2022-11-25 ENCOUNTER — Ambulatory Visit: Payer: PPO | Admitting: Cardiology

## 2022-11-25 DIAGNOSIS — Z45018 Encounter for adjustment and management of other part of cardiac pacemaker: Secondary | ICD-10-CM | POA: Diagnosis not present

## 2022-11-25 DIAGNOSIS — I442 Atrioventricular block, complete: Secondary | ICD-10-CM | POA: Diagnosis not present

## 2022-11-25 DIAGNOSIS — Z950 Presence of cardiac pacemaker: Secondary | ICD-10-CM

## 2022-11-25 DIAGNOSIS — K219 Gastro-esophageal reflux disease without esophagitis: Secondary | ICD-10-CM

## 2022-11-25 MED ORDER — FAMOTIDINE 40 MG PO TABS: 40.0000 mg | ORAL_TABLET | Freq: Two times a day (BID) | ORAL | 3 refills | Status: DC | PRN

## 2022-11-25 NOTE — Telephone Encounter (Signed)
Patient stopped by the office to let me know that the Pepcid is not working. He thinks he is taking Pepcid but he did not bring the bottle and said the wrong name for the medication when asked again. Will route to GI to see if it's possible to send in a prescription for it instead of having him purchase it OTC because he has trouble reading bottles and getting the right medication. Will route for advice.

## 2022-11-26 ENCOUNTER — Other Ambulatory Visit: Payer: Self-pay | Admitting: Medical

## 2022-11-26 DIAGNOSIS — E118 Type 2 diabetes mellitus with unspecified complications: Secondary | ICD-10-CM

## 2022-11-27 ENCOUNTER — Other Ambulatory Visit: Payer: Self-pay | Admitting: Medical

## 2022-12-02 ENCOUNTER — Ambulatory Visit: Payer: PPO | Admitting: Cardiology

## 2022-12-02 ENCOUNTER — Encounter: Payer: Self-pay | Admitting: Cardiology

## 2022-12-02 VITALS — BP 136/74 | HR 67 | Resp 16 | Ht 69.0 in | Wt 189.6 lb

## 2022-12-02 DIAGNOSIS — Z950 Presence of cardiac pacemaker: Secondary | ICD-10-CM | POA: Diagnosis not present

## 2022-12-02 DIAGNOSIS — I70213 Atherosclerosis of native arteries of extremities with intermittent claudication, bilateral legs: Secondary | ICD-10-CM | POA: Diagnosis not present

## 2022-12-02 DIAGNOSIS — I10 Essential (primary) hypertension: Secondary | ICD-10-CM

## 2022-12-02 DIAGNOSIS — E782 Mixed hyperlipidemia: Secondary | ICD-10-CM | POA: Diagnosis not present

## 2022-12-02 DIAGNOSIS — Z952 Presence of prosthetic heart valve: Secondary | ICD-10-CM

## 2022-12-02 NOTE — Progress Notes (Signed)
Primary Physician/Referring:  Carlena Hurl, PA-C  Patient ID: Jonathan Brown, male    DOB: Sep 16, 1951, 71 y.o.   MRN: [id number]  Chief Complaint  Patient presents with   therosclerosis of native artery of both lower extremities w   Hypertension   Follow-up   HPI:    Jonathan Brown  is a 71 y.o.  AA male  patient status post aortic valve replacement using 21 mm pericardial Magna Ease valve for severe aortic stenosis 09/29/2017, Medtronic dual-chamber pacemaker for complete heart block status post aortic valve replacement 10/03/2017, hypertension, DM with chronic stage IIIa kidney disease, mixed hyperlipidemia, PAD. 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  Patient presented with dyspnea and hiccups after he had used cocaine and marijuana on a recreational basis on 09/28/2021, one time exposure and he has remained abstinant.   This is a 40-monthoffice visit, states that he is doing well, he has been walking on a daily basis and exercising on a daily basis and has noticed marked improvement in overall wellbeing, symptoms of claudication.  He has also made changes with his diet. Past Medical History:  Diagnosis Date   Anemia    Arthritis    Cataract 02/26/2021   Bilateral   CH[place] heart [place]) [place] 10/05[place]2017   Diabetes mellitus without complication ([place] [id number]  type 2    Encounter for care of pacemaker 07/20/2019   Former smoker    GERD (gastroesophageal reflux disease)    History of colon polyps    Hyperlipidemia    Hypertension 2012   Open-angle glaucoma 02/26/2021   Pacemaker Medtronic Azure XT DR MRI [id number] chamber pacemaker in situ 10/03/2017   PVD (peripheral vascular disease) ([facility]    S/P AVR    Aortic valve replacement using a 21-mm pericardial Magna Ease (09/29/2017 by Dr. 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     wife is able to read to him   Past Surgical History:  Procedure Laterality Date   AORTIC VALVE REPLACEMENT N/A 09/29/2017   Procedure: AORTIC VALVE REPLACEMENT (AVR) using 277mMagna Ease valve;  Surgeon: VaIvin PootMD;  [facility];  Laterality: N/A;   CARDIAC CATHETERIZATION  07/12/2017   COLONOSCOPY  2008   LEFT AND RIGHT HEART CATHETERIZATION WITH CORONARY ANGIOGRAM N/A 04/02/2014   Procedure: LEFT AND RIGHT HEART CATHETERIZATION WITH CORONARY ANGIOGRAM;  Surgeon: JaLaverda PageMD;  [facility];  Service: Cardiovascular;  Laterality: N/A;   PACEMAKER IMPLANT N/A 10/03/2017   Procedure: PACEMAKER IMPLANT;  Surgeon: TaEvans LanceMD;  [facility];  Service: Cardiovascular;  Laterality: N/A;   TEE WITHOUT CARDIOVERSION N/A 04/30/2014   TEE WITHOUT CARDIOVERSION N/A 09/29/2017   Procedure: TRANSESOPHAGEAL ECHOCARDIOGRAM (TEE);  Surgeon: VaPrescott GumPeCollier SalinaMD;  [facility] [facility];  Laterality: N/A;   Social History   Tobacco Use   Smoking status: Former    Packs/day: 0.25    Years: 30.00    Total pack years: 7.50    Types: Cigarettes    Quit date: 09/28/2007    Years since quitting: 15.1   Smokeless tobacco: Never  Substance Use Topics   Alcohol use: Yes    Comment: occasionally   Marital Status: Married  ROS  Review of Systems  Cardiovascular:  Positive for claudication (bilateral calf). Negative for chest pain, dyspnea on exertion and leg swelling.  Gastrointestinal:  Negative for melena.   Objective  Blood pressure 136/74, pulse 67, resp. rate 16, height _0  (1.753 m), weight 189 lb 9.6 oz (86 kg), SpO2 98 %.     12/02/2022   10:30 AM 10/04/2022    9:31 AM 07/05/2022    1:29 PM  Vitals with BMI  Height _1     Weight 189 lbs 10 oz 190 lbs 187 lbs 13 oz  BMI 16.60  63.01  Systolic 601 093 235  Diastolic 74 80 80  Pulse 67  68     Physical Exam Constitutional:       Appearance: He is well-developed.  Neck:     Thyroid: No thyromegaly.     Vascular: No JVD.  Cardiovascular:     Rate and Rhythm: Normal rate and regular rhythm.     Pulses:          Carotid pulses are 2+ on the right side with bruit and 2+ on the left side with bruit.      Femoral pulses are 2+ on the right side and 2+ on the left side.      Popliteal pulses are 2+ on the right side and 1+ on the left side.       Dorsalis pedis pulses are 0 on the right side and 0 on the left side.       Posterior tibial pulses are 2+ on the right side and 0 on the left side.     Heart sounds: Murmur heard.     Harsh midsystolic murmur is present with a grade of 3/6 radiating to the neck.     No gallop.     Comments: Skin is shiny and hair loss noted.   Pulmonary:     Effort: Pulmonary effort is normal.     Breath sounds: Normal breath sounds.  Abdominal:     General: Bowel sounds are normal.     Palpations: Abdomen is soft.  Musculoskeletal:     Right lower leg: Edema (2+ ankle) present.     Left lower leg: Edema (2+ ankle) present.    Laboratory examination:   Lab Results  Component Value Date   NA 142 07/05/2022   K 4.3 07/05/2022   CO2 20 07/05/2022   GLUCOSE 83 07/05/2022   BUN 14 07/05/2022   CREATININE 1.29 (H) 07/05/2022   CALCIUM 10.3 (H) 07/05/2022   EGFR 60 07/05/2022   GFRNONAA >60 09/28/2021    Recent Labs    01/27/22 1123 06/03/22 1112 07/05/22 1346  NA 141 138 142  K 4.3 4.1 4.3  CL 107* 104 106  CO2 _2 GLUCOSE 173* 108* 83  BUN _3 CREATININE 1.31* 1.58* 1.29*  CALCIUM 10.3* 10.4* 10.3*   CrCl cannot be calculated (Patient's most recent lab result is older than the maximum 21 days allowed.).     Latest Ref Rng & Units 07/05/2022    1:46 PM 06/03/2022   11:12 AM 01/27/2022   11:23 AM  CMP  Glucose 70 - 99 mg/dL 83  108  173   BUN 8 - 27 mg/dL _4 Creatinine 0.76 - 1.27 mg/dL 1.29  1.58  1.31   Sodium 134 - 144 mmol/L 142  138  141    Potassium 3.5 - 5.2 mmol/L 4.3  4.1  4.3   Chloride 96 - 106 mmol/L 106  104  107   CO2 20 - 29 mmol/L _0 Calcium 8.6 - 10.2 mg/dL 10.3  10.4  10.3   Total Protein 6.0 - 8.5 g/dL  7.1  6.9   Total Bilirubin 0.0 - 1.2 mg/dL  0.4  0.3   Alkaline Phos 44 - 121 IU/L  89  79   AST 0 - 40 IU/L  20  18   ALT 0 - 44 IU/L  25  14       Latest Ref Rng & Units 06/03/2022   11:12 AM 01/28/2022    3:59 PM 09/28/2021    6:04 AM  CBC  WBC 3.4 - 10.8 x10E3/uL 6.0  5.7    Hemoglobin 13.0 - 17.7 g/dL 13.9  11.1  12.2   Hematocrit 37.5 - 51.0 % 40.5  37.1  36.0   Platelets 150 - 450 x10E3/uL 168  229     Lab Results  Component Value Date   CHOL 113 10/29/2021   HDL 29 (L) 10/29/2021   LDLCALC 45 10/29/2021   TRIG 247 (H) 10/29/2021   CHOLHDL 4.0 09/09/2020     HEMOGLOBIN A1C Lab Results  Component Value Date   HGBA1C 5.7 (A) 10/04/2022   MPG 99.67 09/27/2017   TSH No results for input(s): "TSH" in the last 8760 hours.  Medications and allergies  No Known Allergies   Current Outpatient Medications:    famotidine (PEPCID) 40 MG tablet, Take 1 tablet (40 mg total) by mouth every 12 (twelve) hours as needed for heartburn or indigestion., Disp: 90 tablet, Rfl: 3   acetaminophen (TYLENOL) 500 MG tablet, Take 1,000 mg by mouth in the morning and at bedtime., Disp: , Rfl:    amLODipine (NORVASC) 10 MG tablet, TAKE 1 TABLET BY MOUTH EVERY DAY, Disp: 90 tablet, Rfl: 3   aspirin EC 81 MG tablet, Take 1 tablet (81 mg total) by mouth daily., Disp: 90 tablet, Rfl: 3   cholecalciferol (VITAMIN D3) 25 MCG (1000 UNIT) tablet, TAKE 1 TABLET BY MOUTH EVERY DAY, Disp: 90 tablet, Rfl: 0   fenofibrate (TRICOR) 48 MG tablet, TAKE 1 TABLET BY MOUTH EVERY DAY, Disp: 90 tablet, Rfl: 3   ferrous gluconate (FERGON) 324 MG tablet, TAKE 1 TABLET (324 MG TOTAL) BY MOUTH 2 (TWO) TIMES DAILY WITH A MEAL., Disp: 180 tablet, Rfl: 1   glucose blood test strip, 1 each by Other route as needed for other. Use as  instructed to check blood sugars daily, Disp: 100 each, Rfl: 5   JARDIANCE 10 MG TABS tablet, TAKE 1 TABLET BY MOUTH EVERY DAY BEFORE BREAKFAST, Disp: 90 tablet, Rfl: 1   metFORMIN (GLUCOPHAGE) 500 MG tablet, TAKE ONE TABLET BY MOUTH TWICE DAILY WITH MEAL, Disp: 180 tablet, Rfl: 0   metoprolol tartrate (LOPRESSOR) 50 MG tablet, TAKE 1 TABLET BY MOUTH TWICE A DAY, Disp: 180 tablet, Rfl: 0   Microlet Lancets MISC, 1 each by Does not apply route daily. Use to check blood sugars daily, Disp: 100 each, Rfl: 5   rosuvastatin (CRESTOR) 20 MG tablet, TAKE 1 TABLET BY MOUTH EVERYDAY AT BEDTIME, Disp: 90 tablet, Rfl: 0   valsartan (DIOVAN) 160 MG tablet, TAKE 1 TABLET BY MOUTH EVERY DAY, Disp: 90 tablet, Rfl: 0   vitamin B-12 (CYANOCOBALAMIN) 50 MCG tablet, Take 50 mcg by mouth daily., Disp: , Rfl:     Radiology:  No results found.  Cardiac Studies:   Right and left heart catheterization 07/12/2017:  Normal to mild Mild elevation in pulmonary artery pressure, normal LVEDP, PA pressure 35/12, mean 23 mmHg. Mild noncritical coronary artery disease, ostial ramus 30% calcific stenosis, otherwise 10-20% luminal irregularity in all 3 vessels. Mild disease in the RCA. Aortic valve could not be crossed. Heavily calcified, mild aortic root dilatation and AV appears to be bicuspid.  Carotid artery duplex  06/02/2020:  No hemodynamically significant arterial disease in the internal carotid artery bilaterally.  Antegrade right vertebral artery flow. Antegrade left vertebral artery flow.  Echocardiogram 06/02/2020:  Left ventricle cavity is normal in size. Moderate concentric hypertrophy of the left ventricle. Abnormal septal wall motion due to post-operative valve. Normal LV systolic function with visual EF 50-55%. Doppler evidence of grade I (impaired) diastolic dysfunction, normal LAP.  Left atrial cavity is moderately dilated.  S/p 21-mm pericardial Magna Ease valve  placement (2018). Normal functioning valve  without any paravalular leak, dehiscence. Mean PG 15 mmHg is acceptable. likely normal for post-op status.  Mild tricuspid regurgitation. No evidence of pulmonary hypertension.  Compared to previous study on 07/06/2017, aortic valve replacement is new.  Lower Extremity Arterial Duplex 11/24/2022: No hemodynamically significant stenosis are identified in the right lower extremity arterial system.  Mild velocity increase at the left distal superficial femoral artery of >50% with PSV/EDV ratio of 2.0 only.  This exam reveals mildly decreased perfusion of the right lower extremity, noted at the dorsalis pedis and post tibial artery level (ABI 0.96) with mildly abnormal biphasic waveform pattern at the ankle. This exam reveals normal perfusion of the left lower extremity (ABI 1.01) with mildly abnormal biphasic waveform pattern at the left ankle. Compared to the study done on 07/06/2017, no significant change in the left SFA stenosis, ABI has improved from 0.76 on the right and 0.76 on the left.   Device check:  Medtronic Azure XT DR MRI [id number] Dual chamber pacemaker in situ   Dual-chamber pacemaker transmission 11/11/2022: AP 23%, VP 100%.  Longevity 3 years and 10 months.  Lead impedance and thresholds are normal.  Brief AT. There were no high ventricular rate episodes. Normal pacemaker function.  Scheduled  In office pacemaker check 11/24/22  Single (S)/Dual (D)/BV: D. Presenting ASVP @ 60/min. Pacemaker dependant:  Yes. Underlying No V escape. AP 18.6%, VP 100%.  AMS Episodes 6.  AT/AF burden <1% . Brief AT HVR 0  Longevity 4.4 Years. Magnet rate: >85%. Lead measurements: Stable. Histogram: Low (L)/normal (N)/high (H)  Normal. Patient activity Active.   Observations: Normal function. Changes: RV output decreased from 2.5V at 6m to 2V at 0.458mto improve battery life.  EKG: EKG 05/28/2022: Normal sinus rhythm at rate of 69 bpm, ventricularly paced rhythm.  No further analysis.  No  significant change from 10/26/2021.  Assessment     ICD-10-CM   1. Atherosclerosis of native artery of both lower extremities with intermittent claudication ([facility] I70.213     2. S/P AVR with 21 mm Magna Ease Pericardial Tissue Valve,  09/18/2017: P.Jeralene HuffMD  Z95.2     3. Mixed hyperlipidemia  E78.2     4. Essential hypertension  I10     5. Pacemaker Medtronic Azure XT DR MRI [id number] chamber pacemaker in situ  Z95.0       No orders of the defined types were placed in this encounter.   Recommendations:    RiRANGEL ECHEVERRIis a [age].o. AA male  patient status post aortic valve replacement using 21 mm pericardial Magna Ease valve for severe aortic stenosis 09/29/2017, Medtronic dual-chamber pacemaker for complete heart block status post aortic valve replacement 10/03/2017, hypertension, DM with chronic stage IIIa kidney disease, mixed hyperlipidemia, PAD. 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  Patient presented with dyspnea and hiccups after he had used cocaine and marijuana on a recreational basis on 09/28/2021, one time exposure and he has remained abstinant.   1. S/P AVR with 21 mm Magna Ease Pericardial Tissue Valve,  09/18/2017: Jeralene Huff, MD Patient presents here for a 52-monthoffice visit, presently doing well and he has made significant lifestyle changes with regard to diet and also has been exercising regularly and has been walking and has not had any significant claudication symptoms.  No change in his physical exam.  Aortic valve appears to be functioning normally.  2. Atherosclerosis of native artery of both lower extremities with intermittent claudication (HCC) Peripheral arterial disease symptoms are stable, he has very minimal symptoms now with increasing physical activity he has had almost near resolution of symptoms of claudication.  Advised him to continue to remain active.  No change in his vascular exam.  There is no skin  breakdown.  Only if he develops CLI, he will need revascularization due to small vessel disease.  3. Mixed hyperlipidemia Lipids have been well-controlled with regard to LDL, triglycerides were elevated previously, he does need repeat lipid profile testing and he is seeing SDorothea Ogle PA-C tomorrow and he will have labs done there.  4. Essential hypertension Blood pressure is under good control.  No changes in the medications were done today.  Previously he has had stage IIIa chronic kidney disease, this is also improved and latest EGFR was 60 mL.  Since having made lifestyle changes and diet changes, overall there is improvement in diabetes control as well.  5. Pacemaker Medtronic Azure XT DR MRI [id number] chamber pacemaker in situ His pacemaker is functioning normally.  He has recently had pacemaker check and also remote check both revealed normal functioning pacemaker.  He will continue remote transmissions.  Overall stable from cardiac standpoint, unless new issues were to arise I will see him back in a year or sooner if problems.   JAdrian Prows MD, FSt Louis-[facility]12/06/2022, 11:25 AM Office: [phone]: [phone]

## 2022-12-03 ENCOUNTER — Ambulatory Visit (INDEPENDENT_AMBULATORY_CARE_PROVIDER_SITE_OTHER): Payer: PPO

## 2022-12-03 VITALS — BP 118/64 | HR 71 | Temp 97.5°F | Ht 67.5 in | Wt 190.6 lb

## 2022-12-03 DIAGNOSIS — Z0000 Encounter for general adult medical examination without abnormal findings: Secondary | ICD-10-CM

## 2022-12-03 NOTE — Patient Instructions (Addendum)
[name] , Thank you for taking time to come for your Medicare Wellness Visit. I appreciate your ongoing commitment to your health goals. Please review the following plan we discussed and let me know if I can assist you in the future.   These are the goals we discussed:  Goals      Manage My Medicine     Timeframe:  Long-Range Goal Priority:  Medium Start Date:                             Expected End Date:                       Follow Up Date 04/17/22    - keep a list of all the medicines I take; vitamins and herbals too - learn to read medicine labels - use a pillbox to sort medicine - use an alarm clock or phone to remind me to take my medicine    Why is this important?   These steps will help you keep on track with your medicines.   Notes:      Patient Stated     11/13/2021, exercise more     Patient Stated     12/03/2022, no goals        This is a list of the screening recommended for you and due dates:  Health Maintenance  Topic Date Due   DTaP/Tdap/Td vaccine (1 - Tdap) Never done   Zoster (Shingles) Vaccine (1 of 2) Never done   Eye exam for diabetics  02/26/2022   COVID-19 Vaccine (4 - 2023-24 season) 08/27/2022   Complete foot exam   01/27/2023   Hemoglobin A1C  04/05/2023   Yearly kidney function blood test for diabetes  07/06/2023   Yearly kidney health urinalysis for diabetes  10/05/2023   Medicare Annual Wellness Visit  12/04/2023   Pneumonia Vaccine  Completed   Flu Shot  Completed   Hepatitis C Screening: USPSTF Recommendation to screen - Ages 18-79 yo.  Completed   HPV Vaccine  Aged Out   Colon Cancer Screening  Discontinued   Cologuard (Stool DNA test)  Discontinued    Advanced directives: Advance directive discussed with you today. Even though you declined this today please call our office should you change your mind and we can give you the proper paperwork for you to fill out.  Conditions/risks identified: none  Next appointment: Follow up  in one year for your annual wellness visit.   Preventive Care 27 Years and Older, Male  Preventive care refers to lifestyle choices and visits with your health care provider that can promote health and wellness. What does preventive care include? A yearly physical exam. This is also called an annual well check. Dental exams once or twice a year. Routine eye exams. Ask your health care provider how often you should have your eyes checked. Personal lifestyle choices, including: Daily care of your teeth and gums. Regular physical activity. Eating a healthy diet. Avoiding tobacco and drug use. Limiting alcohol use. Practicing safe sex. Taking low doses of aspirin every day. Taking vitamin and mineral supplements as recommended by your health care provider. What happens during an annual well check? The services and screenings done by your health care provider during your annual well check will depend on your age, overall health, lifestyle risk factors, and family history of disease. Counseling  Your health care provider may ask you  questions about your: Alcohol use. Tobacco use. Drug use. Emotional well-being. Home and relationship well-being. Sexual activity. Eating habits. History of falls. Memory and ability to understand (cognition). Work and work Astronomer. Screening  You may have the following tests or measurements: Height, weight, and BMI. Blood pressure. Lipid and cholesterol levels. These may be checked every 5 years, or more frequently if you are over 13 years old. Skin check. Lung cancer screening. You may have this screening every year starting at age 68 if you have a 30-pack-year history of smoking and currently smoke or have quit within the past 15 years. Fecal occult blood test (FOBT) of the stool. You may have this test every year starting at age 21. Flexible sigmoidoscopy or colonoscopy. You may have a sigmoidoscopy every 5 years or a colonoscopy every 10 years  starting at age 78. Prostate cancer screening. Recommendations will vary depending on your family history and other risks. Hepatitis C blood test. Hepatitis B blood test. Sexually transmitted disease (STD) testing. Diabetes screening. This is done by checking your blood sugar (glucose) after you have not eaten for a while (fasting). You may have this done every 1-3 years. Abdominal aortic aneurysm (AAA) screening. You may need this if you are a current or former smoker. Osteoporosis. You may be screened starting at age 81 if you are at high risk. Talk with your health care provider about your test results, treatment options, and if necessary, the need for more tests. Vaccines  Your health care provider may recommend certain vaccines, such as: Influenza vaccine. This is recommended every year. Tetanus, diphtheria, and acellular pertussis (Tdap, Td) vaccine. You may need a Td booster every 10 years. Zoster vaccine. You may need this after age 41. Pneumococcal 13-valent conjugate (PCV13) vaccine. One dose is recommended after age 69. Pneumococcal polysaccharide (PPSV23) vaccine. One dose is recommended after age 39. Talk to your health care provider about which screenings and vaccines you need and how often you need them. This information is not intended to replace advice given to you by your health care provider. Make sure you discuss any questions you have with your health care provider. Document Released: 01/09/2016 Document Revised: 09/01/2016 Document Reviewed: 10/14/2015 Elsevier Interactive Patient Education  2017 [employer].  Fall Prevention in the Home Falls can cause injuries. They can happen to people of all ages. There are many things you can do to make your home safe and to help prevent falls. What can I do on the outside of my home? Regularly fix the edges of walkways and driveways and fix any cracks. Remove anything that might make you trip as you walk through a door, such as  a raised step or threshold. Trim any bushes or trees on the path to your home. Use bright outdoor lighting. Clear any walking paths of anything that might make someone trip, such as rocks or tools. Regularly check to see if handrails are loose or broken. Make sure that both sides of any steps have handrails. Any raised decks and porches should have guardrails on the edges. Have any leaves, snow, or ice cleared regularly. Use sand or salt on walking paths during winter. Clean up any spills in your garage right away. This includes oil or grease spills. What can I do in the bathroom? Use night lights. Install grab bars by the toilet and in the tub and shower. Do not use towel bars as grab bars. Use non-skid mats or decals in the tub or shower. If you  need to sit down in the shower, use a plastic, non-slip stool. Keep the floor dry. Clean up any water that spills on the floor as soon as it happens. Remove soap buildup in the tub or shower regularly. Attach bath mats securely with double-sided non-slip rug tape. Do not have throw rugs and other things on the floor that can make you trip. What can I do in the bedroom? Use night lights. Make sure that you have a light by your bed that is easy to reach. Do not use any sheets or blankets that are too big for your bed. They should not hang down onto the floor. Have a firm chair that has side arms. You can use this for support while you get dressed. Do not have throw rugs and other things on the floor that can make you trip. What can I do in the kitchen? Clean up any spills right away. Avoid walking on wet floors. Keep items that you use a lot in easy-to-reach places. If you need to reach something above you, use a strong step stool that has a grab bar. Keep electrical cords out of the way. Do not use floor polish or wax that makes floors slippery. If you must use wax, use non-skid floor wax. Do not have throw rugs and other things on the floor  that can make you trip. What can I do with my stairs? Do not leave any items on the stairs. Make sure that there are handrails on both sides of the stairs and use them. Fix handrails that are broken or loose. Make sure that handrails are as long as the stairways. Check any carpeting to make sure that it is firmly attached to the stairs. Fix any carpet that is loose or worn. Avoid having throw rugs at the top or bottom of the stairs. If you do have throw rugs, attach them to the floor with carpet tape. Make sure that you have a light switch at the top of the stairs and the bottom of the stairs. If you do not have them, ask someone to add them for you. What else can I do to help prevent falls? Wear shoes that: Do not have high heels. Have rubber bottoms. Are comfortable and fit you well. Are closed at the toe. Do not wear sandals. If you use a stepladder: Make sure that it is fully opened. Do not climb a closed stepladder. Make sure that both sides of the stepladder are locked into place. Ask someone to hold it for you, if possible. Clearly mark and make sure that you can see: Any grab bars or handrails. First and last steps. Where the edge of each step is. Use tools that help you move around (mobility aids) if they are needed. These include: Canes. Walkers. Scooters. Crutches. Turn on the lights when you go into a dark area. Replace any light bulbs as soon as they burn out. Set up your furniture so you have a clear path. Avoid moving your furniture around. If any of your floors are uneven, fix them. If there are any pets around you, be aware of where they are. Review your medicines with your doctor. Some medicines can make you feel dizzy. This can increase your chance of falling. Ask your doctor what other things that you can do to help prevent falls. This information is not intended to replace advice given to you by your health care provider. Make sure you discuss any questions you  have with your  health care provider. Document Released: 10/09/2009 Document Revised: 05/20/2016 Document Reviewed: 01/17/2015 Elsevier Interactive Patient Education  2017 [employer].

## 2022-12-03 NOTE — Progress Notes (Signed)
Subjective:   Jonathan FinnerRichard G Brown is a 71 y.o. male who presents for Medicare Annual/Subsequent preventive examination.  Review of Systems     Cardiac Risk Factors include: advanced age (>5355men, 69>65 women);diabetes mellitus;dyslipidemia;hypertension;male gender     Objective:    Today's Vitals   12/03/22 0853  BP: 118/64  Pulse: 71  Temp: (!) 97.5 F (36.4 C)  TempSrc: Oral  SpO2: 99%  Weight: 190 lb 9.6 oz (86.5 kg)  Height: 5' 7.5" (1.715 m)   Body mass index is 29.41 kg/m.     12/03/2022    9:04 AM 11/13/2021    8:29 AM 08/28/2021    3:13 PM 11/15/2017   12:02 PM 09/30/2017    3:00 PM 09/27/2017    8:21 AM 07/12/2017    7:49 AM  Advanced Directives  Does Patient Have a Medical Advance Directive? No No No No No No No  Would patient like information on creating a medical advance directive? No - Patient declined No - Patient declined   No - Patient declined No - Patient declined No - Patient declined    Current Medications (verified) Outpatient Encounter Medications as of 12/03/2022  Medication Sig   acetaminophen (TYLENOL) 500 MG tablet Take 1,000 mg by mouth in the morning and at bedtime.   amLODipine (NORVASC) 10 MG tablet TAKE 1 TABLET BY MOUTH EVERY DAY   aspirin EC 81 MG tablet Take 1 tablet (81 mg total) by mouth daily.   cholecalciferol (VITAMIN D3) 25 MCG (1000 UNIT) tablet TAKE 1 TABLET BY MOUTH EVERY DAY   famotidine (PEPCID) 40 MG tablet Take 1 tablet (40 mg total) by mouth every 12 (twelve) hours as needed for heartburn or indigestion.   fenofibrate (TRICOR) 48 MG tablet TAKE 1 TABLET BY MOUTH EVERY DAY   ferrous gluconate (FERGON) 324 MG tablet TAKE 1 TABLET (324 MG TOTAL) BY MOUTH 2 (TWO) TIMES DAILY WITH A MEAL.   glucose blood test strip 1 each by Other route as needed for other. Use as instructed to check blood sugars daily   JARDIANCE 10 MG TABS tablet TAKE 1 TABLET BY MOUTH EVERY DAY BEFORE BREAKFAST   metFORMIN (GLUCOPHAGE) 500 MG tablet TAKE ONE  TABLET BY MOUTH TWICE DAILY WITH MEAL   metoprolol tartrate (LOPRESSOR) 50 MG tablet TAKE 1 TABLET BY MOUTH TWICE A DAY   Microlet Lancets MISC 1 each by Does not apply route daily. Use to check blood sugars daily   Multiple Vitamins-Minerals (CENTRUM SILVER 50+MEN PO) Take 1 tablet by mouth daily.   rosuvastatin (CRESTOR) 20 MG tablet TAKE 1 TABLET BY MOUTH EVERYDAY AT BEDTIME   valsartan (DIOVAN) 160 MG tablet TAKE 1 TABLET BY MOUTH EVERY DAY   vitamin B-12 (CYANOCOBALAMIN) 50 MCG tablet Take 50 mcg by mouth daily.   No facility-administered encounter medications on file as of 12/03/2022.    Allergies (verified) Patient has no known allergies.   History: Past Medical History:  Diagnosis Date   Anemia    Arthritis    Cataract 02/26/2021   Bilateral   CHB (complete heart block) (HCC) 10/05/2017   Diabetes mellitus without complication (HCC) 2014   type 2    Encounter for care of pacemaker 07/20/2019   Former smoker    GERD (gastroesophageal reflux disease)    History of colon polyps    Hyperlipidemia    Hypertension 2012   Open-angle glaucoma 02/26/2021   Pacemaker Medtronic Azure XT DR MRI [id number] Dual chamber pacemaker in situ 10/03/2017  PVD (peripheral vascular disease) (HCC)    S/P AVR    Aortic valve replacement using a 21-mm pericardial Magna Ease (09/29/2017 by Dr. 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    wife is able to read to him   Past Surgical History:  Procedure Laterality Date   AORTIC VALVE REPLACEMENT N/A 09/29/2017   Procedure: AORTIC VALVE REPLACEMENT (AVR) using 21mm Magna Ease valve;  Surgeon: Kerin Perna, MD;  Location: [facility] OR;  Service: Open Heart Surgery;  Laterality: N/A;   CARDIAC CATHETERIZATION  07/12/2017   COLONOSCOPY  2008   LEFT AND RIGHT HEART CATHETERIZATION WITH CORONARY ANGIOGRAM N/A 04/02/2014   Procedure: LEFT AND RIGHT HEART  CATHETERIZATION WITH CORONARY ANGIOGRAM;  Surgeon: Pamella Pert, MD;  Location: [facility] CATH LAB;  Service: Cardiovascular;  Laterality: N/A;   PACEMAKER IMPLANT N/A 10/03/2017   Procedure: PACEMAKER IMPLANT;  Surgeon: Marinus Maw, MD;  Location: [facility] INVASIVE CV LAB;  Service: Cardiovascular;  Laterality: N/A;   TEE WITHOUT CARDIOVERSION N/A 04/30/2014   TEE WITHOUT CARDIOVERSION N/A 09/29/2017   Procedure: TRANSESOPHAGEAL ECHOCARDIOGRAM (TEE);  Surgeon: Donata Clay, Theron Arista, MD;  Location: [facility] OR;  Service: Open Heart Surgery;  Laterality: N/A;   Family History  Problem Relation Age of Onset   Other Mother        died of old age   Heart disease Father    Sleep apnea Sister    Diabetes Brother        diabetic coma x 3 years   Alcohol abuse Brother    Heart disease Brother 85       MI   Cancer Neg Hx    Stroke Neg Hx    Rectal cancer Neg Hx    Esophageal cancer Neg Hx    Stomach cancer Neg Hx    Social History   Socioeconomic History   Marital status: Married    Spouse name: Not on file   Number of children: 0   Years of education: Not on file   Highest education level: Not on file  Occupational History   Not on file  Tobacco Use   Smoking status: Former    Packs/day: 0.25    Years: 30.00    Total pack years: 7.50    Types: Cigarettes    Quit date: 09/28/2007    Years since quitting: 15.1   Smokeless tobacco: Never  Vaping Use   Vaping Use: Never used  Substance and Sexual Activity   Alcohol use: Yes    Comment: occasionally   Drug use: Yes    Frequency: 7.0 times per week    Types: Marijuana    Comment: patient says he smokes marajuana once a day   Sexual activity: Yes  Other Topics Concern   Not on file  Social History Narrative   Married, walks for exercise, step- son lives in [place], [place].  05/2019   Social Determinants of Health   Financial Resource Strain: Low Risk  (12/03/2022)   Overall Financial Resource Strain (CARDIA)    Difficulty of Paying Living  Expenses: Not hard at all  Food Insecurity: No Food Insecurity (12/03/2022)   Hunger Vital Sign    Worried About Running Out of Food in the Last Year: Never true    Ran Out of Food in the Last Year: Never true  Transportation Needs: No Transportation Needs (12/17/2021)  PRAPARE - Administrator, Civil Service (Medical): No    Lack of Transportation (Non-Medical): No  Physical Activity: Inactive (12/03/2022)   Exercise Vital Sign    Days of Exercise per Week: 0 days    Minutes of Exercise per Session: 0 min  Stress: No Stress Concern Present (12/03/2022)   [employer] of Occupational Health - Occupational Stress Questionnaire    Feeling of Stress : Not at all  Social Connections: Not on file    Tobacco Counseling Counseling given: Not Answered   Clinical Intake:  Pre-visit preparation completed: Yes  Pain : No/denies pain     Nutritional Status: BMI 25 -29 Overweight Nutritional Risks: None Diabetes: Yes  How often do you need to have someone help you when you read instructions, pamphlets, or other written materials from your doctor or pharmacy?: 1 - Never What is the last grade level you completed in school?: 12th grade  Diabetic? Yes Nutrition Risk Assessment:  Has the patient had any N/V/D within the last 2 months?  No  Does the patient have any non-healing wounds?  No  Has the patient had any unintentional weight loss or weight gain?  No   Diabetes:  Is the patient diabetic?  Yes  If diabetic, was a CBG obtained today?  No  Did the patient bring in their glucometer from home?  No  How often do you monitor your CBG's? Every other day.   Financial Strains and Diabetes Management:  Are you having any financial strains with the device, your supplies or your medication? No .  Does the patient want to be seen by Chronic Care Management for management of their diabetes?  No  Would the patient like to be referred to a Nutritionist or for Diabetic  Management?  No   Diabetic Exams:  Diabetic Eye Exam: Overdue for diabetic eye exam. Pt has been advised about the importance in completing this exam. Patient advised to call and schedule an eye exam. Diabetic Foot Exam: Completed 01/27/2022   Interpreter Needed?: No  Information entered by :: NAllen LPN   Activities of Daily Living    12/03/2022    9:07 AM  In your present state of health, do you have any difficulty performing the following activities:  Hearing? 0  Vision? 1  Comment little blurry in left eye  Difficulty concentrating or making decisions? 0  Walking or climbing stairs? 0  Dressing or bathing? 0  Doing errands, shopping? 0  Preparing Food and eating ? N  Using the Toilet? N  In the past six months, have you accidently leaked urine? Y  Do you have problems with loss of bowel control? N  Managing your Medications? N  Managing your Finances? N  Housekeeping or managing your Housekeeping? N    Patient Care Team: Tysinger, Kermit Balo, PA-C as PCP - General (Family Medicine) Verner Chol, [facility] as Pharmacist (Pharmacist)  Indicate any recent Medical Services you may have received from other than [facility] providers in the past year (date may be approximate).     Assessment:   This is a routine wellness examination for Jonathan Brown.  Hearing/Vision screen Vision Screening - Comments:: Regular eye exams, Dr. Nile Riggs  Dietary issues and exercise activities discussed: Current Exercise Habits: The patient does not participate in regular exercise at present   Goals Addressed             This Visit's Progress    Patient Stated  12/03/2022, no goals       Depression Screen    12/03/2022    9:06 AM 06/03/2022   10:27 AM 01/27/2022   10:18 AM 11/13/2021    8:30 AM 09/01/2021   11:07 AM 12/11/2020    9:20 AM 09/09/2020   12:01 PM  PHQ 2/9 Scores  PHQ - 2 Score 0 0 0 0 0 0 0  PHQ- 9 Score 2          Fall Risk    12/03/2022    9:06 AM 06/03/2022   10:27 AM  01/27/2022   10:17 AM 11/13/2021    8:30 AM 09/01/2021   11:06 AM  Fall Risk   Falls in the past year? 0 0 0 0 0  Number falls in past yr: 0 0 0  0  Injury with Fall? 0 0 0  0  Risk for fall due to : Medication side effect No Fall Risks No Fall Risks Medication side effect No Fall Risks  Follow up Falls prevention discussed;Education provided;Falls evaluation completed Falls evaluation completed Falls evaluation completed Falls evaluation completed;Education provided;Falls prevention discussed Falls evaluation completed    FALL RISK PREVENTION PERTAINING TO THE HOME:  Any stairs in or around the home? No  If so, are there any without handrails? N/a Home free of loose throw rugs in walkways, pet beds, electrical cords, etc? Yes  Adequate lighting in your home to reduce risk of falls? Yes   ASSISTIVE DEVICES UTILIZED TO PREVENT FALLS:  Life alert? No  Use of a cane, walker or w/c? No  Grab bars in the bathroom? No  Shower chair or bench in shower? No  Elevated toilet seat or a handicapped toilet? No   TIMED UP AND GO:  Was the test performed? Yes .  Length of time to ambulate 10 feet: 5 sec.   Gait steady and fast without use of assistive device  Cognitive Function:        12/03/2022    9:09 AM 11/13/2021    8:31 AM  6CIT Screen  What Year? 4 points 0 points  What month? 3 points 0 points  What time? 0 points 0 points  Count back from 20 0 points 0 points  Months in reverse 4 points 4 points  Repeat phrase 10 points 8 points  Total Score 21 points 12 points    Immunizations Immunization History  Administered Date(s) Administered   Fluad Quad(high Dose 65+) 09/09/2020, 09/01/2021, 10/04/2022   Influenza, High Dose Seasonal PF 10/19/2018, 11/01/2019, 11/01/2019   Influenza,inj,Quad PF,6+ Mos 02/03/2016, 11/04/2016   PFIZER(Purple Top)SARS-COV-2 Vaccination 02/19/2020, 03/11/2020, 11/28/2020   Pneumococcal Conjugate-13 06/15/2018   Pneumococcal Polysaccharide-23  11/13/2021    TDAP status: Due, Education has been provided regarding the importance of this vaccine. Advised may receive this vaccine at local pharmacy or Health Dept. Aware to provide a copy of the vaccination record if obtained from local pharmacy or Health Dept. Verbalized acceptance and understanding.  Flu Vaccine status: Up to date  Pneumococcal vaccine status: Up to date  Covid-19 vaccine status: Completed vaccines  Qualifies for Shingles Vaccine? Yes   Zostavax completed No   Shingrix Completed?: No.    Education has been provided regarding the importance of this vaccine. Patient has been advised to call insurance company to determine out of pocket expense if they have not yet received this vaccine. Advised may also receive vaccine at local pharmacy or Health Dept. Verbalized acceptance and understanding.  Screening Tests Health Maintenance  Topic Date Due   DTaP/Tdap/Td (1 - Tdap) Never done   Zoster Vaccines- Shingrix (1 of 2) Never done   OPHTHALMOLOGY EXAM  02/26/2022   COVID-19 Vaccine (4 - 2023-24 season) 08/27/2022   FOOT EXAM  01/27/2023   HEMOGLOBIN A1C  04/05/2023   Diabetic kidney evaluation - GFR measurement  07/06/2023   Diabetic kidney evaluation - Urine ACR  10/05/2023   Medicare Annual Wellness (AWV)  12/04/2023   Pneumonia Vaccine 2+ Years old  Completed   INFLUENZA VACCINE  Completed   Hepatitis C Screening  Completed   HPV VACCINES  Aged Out   COLONOSCOPY (Pts 45-11yrs Insurance coverage will need to be confirmed)  Discontinued   Fecal DNA (Cologuard)  Discontinued    Health Maintenance  Health Maintenance Due  Topic Date Due   DTaP/Tdap/Td (1 - Tdap) Never done   Zoster Vaccines- Shingrix (1 of 2) Never done   OPHTHALMOLOGY EXAM  02/26/2022   COVID-19 Vaccine (4 - 2023-24 season) 08/27/2022    Colorectal cancer screening: No longer required.   Lung Cancer Screening: (Low Dose CT Chest recommended if Age 11-80 years, 30 pack-year currently  smoking OR have quit w/in 15years.) does not qualify.   Lung Cancer Screening Referral: no  Additional Screening:  Hepatitis C Screening: does qualify; Completed 06/14/2017  Vision Screening: Recommended annual ophthalmology exams for early detection of glaucoma and other disorders of the eye. Is the patient up to date with their annual eye exam?  No  Who is the provider or what is the name of the office in which the patient attends annual eye exams? Dr. Nile Riggs If pt is not established with a provider, would they like to be referred to a provider to establish care? No .   Dental Screening: Recommended annual dental exams for proper oral hygiene  Community Resource Referral / Chronic Care Management: CRR required this visit?  No   CCM required this visit?  No      Plan:     I have personally reviewed and noted the following in the patient's chart:   Medical and social history Use of alcohol, tobacco or illicit drugs  Current medications and supplements including opioid prescriptions. Patient is not currently taking opioid prescriptions. Functional ability and status Nutritional status Physical activity Advanced directives List of other physicians Hospitalizations, surgeries, and ER visits in previous 12 months Vitals Screenings to include cognitive, depression, and falls Referrals and appointments  In addition, I have reviewed and discussed with patient certain preventive protocols, quality metrics, and best practice recommendations. A written personalized care plan for preventive services as well as general preventive health recommendations were provided to patient.     Barb Merino, LPN   80/07/8109   Nurse Notes: none

## 2022-12-13 ENCOUNTER — Telehealth: Payer: Self-pay | Admitting: Pharmacist

## 2022-12-13 NOTE — Chronic Care Management (AMB) (Unsigned)
    Chronic Care Management Pharmacy Assistant   Name: Jonathan Brown  MRN: [id number] DOB: Dec 06, 1951  Reason for Encounter: Pepcid Tolerance per MP   Recent office visits:  12/03/22 Barb Merino, LPN - Patient presented for Medicare Annual Wellness Exam. No medication changes.  10/04/22 Tysinger, Kermit Balo, PA-C - Patient presented for Type 2 diabetes mellitus with complication without long term current use of insulin and other concerns. Stopped Sodium chloride. Stopped Visine.   Recent consult visits:  12/02/22 Yates Decamp, MD (Cardiology) - Patient presented for Atherosclerosis of native artery of both lower extremities with intermittent claudication and other concerns. No medication changes.  Hospital visits:  None in previous 6 months  Medications: Outpatient Encounter Medications as of 12/13/2022  Medication Sig   acetaminophen (TYLENOL) 500 MG tablet Take 1,000 mg by mouth in the morning and at bedtime.   amLODipine (NORVASC) 10 MG tablet TAKE 1 TABLET BY MOUTH EVERY DAY   aspirin EC 81 MG tablet Take 1 tablet (81 mg total) by mouth daily.   cholecalciferol (VITAMIN D3) 25 MCG (1000 UNIT) tablet TAKE 1 TABLET BY MOUTH EVERY DAY   famotidine (PEPCID) 40 MG tablet Take 1 tablet (40 mg total) by mouth every 12 (twelve) hours as needed for heartburn or indigestion.   fenofibrate (TRICOR) 48 MG tablet TAKE 1 TABLET BY MOUTH EVERY DAY   ferrous gluconate (FERGON) 324 MG tablet TAKE 1 TABLET (324 MG TOTAL) BY MOUTH 2 (TWO) TIMES DAILY WITH A MEAL.   glucose blood test strip 1 each by Other route as needed for other. Use as instructed to check blood sugars daily   JARDIANCE 10 MG TABS tablet TAKE 1 TABLET BY MOUTH EVERY DAY BEFORE BREAKFAST   metFORMIN (GLUCOPHAGE) 500 MG tablet TAKE ONE TABLET BY MOUTH TWICE DAILY WITH MEAL   metoprolol tartrate (LOPRESSOR) 50 MG tablet TAKE 1 TABLET BY MOUTH TWICE A DAY   Microlet Lancets MISC 1 each by Does not apply route daily. Use to check  blood sugars daily   Multiple Vitamins-Minerals (CENTRUM SILVER 50+MEN PO) Take 1 tablet by mouth daily.   rosuvastatin (CRESTOR) 20 MG tablet TAKE 1 TABLET BY MOUTH EVERYDAY AT BEDTIME   valsartan (DIOVAN) 160 MG tablet TAKE 1 TABLET BY MOUTH EVERY DAY   vitamin B-12 (CYANOCOBALAMIN) 50 MCG tablet Take 50 mcg by mouth daily.   No facility-administered encounter medications on file as of 12/13/2022.   Notes: Call to patient to see if the Pepcid that he was recently prescribed is working for him, and if he has been having any symptoms of GERD per MP. Patient reports that it has been helping him tremendously. He denies an symptoms of chest pain/burning, reflux, or difficulty swallowing.  Care Gaps: TDAP  -Overdue Zoster Vaccine - Overdue Eye Exam - Overdue COVID Booster - Overdue AWV- 11/22 Lab Results  Component Value Date   HGBA1C 5.7 (A) 10/04/2022    Star Rating Drugs: Valsartan HCTZ - Last filled [facility]/14/22 [facility]                       Jardiance 10 mg - Last filled [facility]/14/22 [facility]  Metformin 500 mg - Last filled 05/29/22 [facility] Rosuvastatin 20 mg - Last filled 6/323 [facility] at [facility]    Pamala Duffel [facility] Clinical Pharmacist Assistant [phone]

## 2022-12-21 ENCOUNTER — Ambulatory Visit: Payer: No Typology Code available for payment source | Admitting: Podiatry

## 2023-01-03 DIAGNOSIS — M19079 Primary osteoarthritis, unspecified ankle and foot: Secondary | ICD-10-CM | POA: Diagnosis not present

## 2023-01-03 DIAGNOSIS — M2062 Acquired deformities of toe(s), unspecified, left foot: Secondary | ICD-10-CM | POA: Diagnosis not present

## 2023-01-03 DIAGNOSIS — L603 Nail dystrophy: Secondary | ICD-10-CM | POA: Diagnosis not present

## 2023-01-03 DIAGNOSIS — M19071 Primary osteoarthritis, right ankle and foot: Secondary | ICD-10-CM | POA: Diagnosis not present

## 2023-01-03 DIAGNOSIS — M19072 Primary osteoarthritis, left ankle and foot: Secondary | ICD-10-CM | POA: Diagnosis not present

## 2023-01-03 DIAGNOSIS — M79671 Pain in right foot: Secondary | ICD-10-CM | POA: Diagnosis not present

## 2023-01-03 DIAGNOSIS — M2061 Acquired deformities of toe(s), unspecified, right foot: Secondary | ICD-10-CM | POA: Diagnosis not present

## 2023-01-03 DIAGNOSIS — M79672 Pain in left foot: Secondary | ICD-10-CM | POA: Diagnosis not present

## 2023-01-17 ENCOUNTER — Ambulatory Visit: Payer: PPO | Admitting: Podiatry

## 2023-01-17 ENCOUNTER — Encounter: Payer: Self-pay | Admitting: Podiatry

## 2023-01-17 DIAGNOSIS — I739 Peripheral vascular disease, unspecified: Secondary | ICD-10-CM

## 2023-01-17 DIAGNOSIS — B351 Tinea unguium: Secondary | ICD-10-CM

## 2023-01-17 DIAGNOSIS — E1141 Type 2 diabetes mellitus with diabetic mononeuropathy: Secondary | ICD-10-CM

## 2023-01-17 DIAGNOSIS — M79609 Pain in unspecified limb: Secondary | ICD-10-CM | POA: Diagnosis not present

## 2023-01-17 NOTE — Progress Notes (Signed)
This patient returns to my office for at risk foot care.  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  This patient is unable to cut nails himself since the patient cannot reach his nails.These nails are painful walking and wearing shoes.  This patient presents for at risk foot care today.  General Appearance  Alert, conversant and in no acute stress.  Vascular  Dorsalis pedis and posterior tibial  pulses are palpable  bilaterally.  Capillary return is within normal limits  bilaterally. Temperature is within normal limits  bilaterally.  Neurologic  Senn-Weinstein monofilament wire test within normal limits  bilaterally. Muscle power within normal limits bilaterally.  Nails Thick disfigured discolored nails with subungual debris  from hallux to fifth toes bilaterally. No evidence of bacterial infection or drainage bilaterally.  Orthopedic  No limitations of motion  feet .  No crepitus or effusions noted.  No bony pathology or digital deformities noted.  Skin  normotropic skin with no porokeratosis noted bilaterally.  No signs of infections or ulcers noted.     Onychomycosis  Pain in right toes  Pain in left toes  Consent was obtained for treatment procedures.   Mechanical debridement of nails 1-5  bilaterally performed with a nail nipper.  Filed with dremel without incident.    Return office visit   4  months                   Told patient to return for periodic foot care and evaluation due to potential at risk complications.   Dinisha Cai DPM  

## 2023-02-03 ENCOUNTER — Ambulatory Visit (INDEPENDENT_AMBULATORY_CARE_PROVIDER_SITE_OTHER): Payer: PPO | Admitting: Medical

## 2023-02-03 ENCOUNTER — Encounter: Payer: Self-pay | Admitting: Medical

## 2023-02-03 VITALS — BP 120/78 | HR 86 | Ht 68.0 in | Wt 190.4 lb

## 2023-02-03 DIAGNOSIS — E1141 Type 2 diabetes mellitus with diabetic mononeuropathy: Secondary | ICD-10-CM

## 2023-02-03 DIAGNOSIS — E785 Hyperlipidemia, unspecified: Secondary | ICD-10-CM | POA: Diagnosis not present

## 2023-02-03 DIAGNOSIS — I70213 Atherosclerosis of native arteries of extremities with intermittent claudication, bilateral legs: Secondary | ICD-10-CM

## 2023-02-03 DIAGNOSIS — Z87891 Personal history of nicotine dependence: Secondary | ICD-10-CM

## 2023-02-03 DIAGNOSIS — R9389 Abnormal findings on diagnostic imaging of other specified body structures: Secondary | ICD-10-CM

## 2023-02-03 DIAGNOSIS — K219 Gastro-esophageal reflux disease without esophagitis: Secondary | ICD-10-CM | POA: Diagnosis not present

## 2023-02-03 DIAGNOSIS — Z7189 Other specified counseling: Secondary | ICD-10-CM | POA: Diagnosis not present

## 2023-02-03 DIAGNOSIS — Z0000 Encounter for general adult medical examination without abnormal findings: Secondary | ICD-10-CM | POA: Diagnosis not present

## 2023-02-03 DIAGNOSIS — E1169 Type 2 diabetes mellitus with other specified complication: Secondary | ICD-10-CM | POA: Diagnosis not present

## 2023-02-03 DIAGNOSIS — Z7185 Encounter for immunization safety counseling: Secondary | ICD-10-CM

## 2023-02-03 DIAGNOSIS — Z950 Presence of cardiac pacemaker: Secondary | ICD-10-CM | POA: Diagnosis not present

## 2023-02-03 DIAGNOSIS — Z550 Illiteracy and low-level literacy: Secondary | ICD-10-CM | POA: Diagnosis not present

## 2023-02-03 DIAGNOSIS — I10 Essential (primary) hypertension: Secondary | ICD-10-CM

## 2023-02-03 DIAGNOSIS — E118 Type 2 diabetes mellitus with unspecified complications: Secondary | ICD-10-CM

## 2023-02-03 DIAGNOSIS — Z952 Presence of prosthetic heart valve: Secondary | ICD-10-CM

## 2023-02-03 DIAGNOSIS — L602 Onychogryphosis: Secondary | ICD-10-CM | POA: Diagnosis not present

## 2023-02-03 DIAGNOSIS — I739 Peripheral vascular disease, unspecified: Secondary | ICD-10-CM

## 2023-02-03 DIAGNOSIS — I700 Atherosclerosis of aorta: Secondary | ICD-10-CM | POA: Diagnosis not present

## 2023-02-03 DIAGNOSIS — J929 Pleural plaque without asbestos: Secondary | ICD-10-CM

## 2023-02-03 LAB — POCT URINALYSIS DIP (PROADVANTAGE DEVICE)
Bilirubin, UA: NEGATIVE
Blood, UA: NEGATIVE
Glucose, UA: >=1000 mg/dL — AB
Ketones, POC UA: NEGATIVE mg/dL
Leukocytes, UA: NEGATIVE
Nitrite, UA: NEGATIVE
Protein Ur, POC: NEGATIVE mg/dL
Specific Gravity, Urine: 1.015
Urobilinogen, Ur: NEGATIVE
pH, UA: 6 (ref 5.0–8.0)

## 2023-02-03 MED ORDER — OMEPRAZOLE 40 MG PO CPDR: 40.0000 mg | DELAYED_RELEASE_CAPSULE | Freq: Every day | ORAL | 0 refills | Status: DC

## 2023-02-03 NOTE — Progress Notes (Signed)
Pt is scheduled for Monday May 6thth at 8:50pm with Dr. Gershon Crane. I will mail this to patient

## 2023-02-03 NOTE — Progress Notes (Signed)
Subjective:   HPI  Jonathan Brown is a 72 y.o. male who presents for Chief Complaint  Patient presents with   fasting cpe    Fasting cpe, had awv in Dec 2023. No concerns    Patient Care Team: Audine Mangione, Camelia Eng, PA-C as PCP - General (Family Medicine) Pa, [facility] Gardiner Barefoot, DPM as Consulting Physician (Podiatry) Adrian Prows, MD as Consulting Physician (Cardiology) Daryel November, MD as Consulting Physician (Gastroenterology) Sees dentist Sees eye doctor   Concerns: Having some issues with GERD.  Famotidine isn't working. Wants to go back on omeprazole he used prior  Has some trouble with insomnia.  Compliant with medicaiton in general   Reviewed their medical, surgical, family, social, medication, and allergy history and updated chart as appropriate.  Past Medical History:  Diagnosis Date   Anemia    Arthritis    Cataract 02/26/2021   Bilateral   CHB (complete heart block) ([place]) 10/05/2017   Diabetes mellitus without complication ([place]) 1610   type 2    Encounter for care of pacemaker 07/20/2019   Former smoker    GERD (gastroesophageal reflux disease)    History of colon polyps    Hyperlipidemia    Hypertension 2012   Open-angle glaucoma 02/26/2021   Pacemaker Medtronic Azure XT DR MRI [id number] Dual chamber pacemaker in situ 10/03/2017   PVD (peripheral vascular disease) ([place])    S/P AVR    Aortic valve replacement using a 21-mm pericardial Magna Ease (09/29/2017 by Dr. 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    wife is able to read to him    Past Surgical History:  Procedure Laterality Date   AORTIC VALVE REPLACEMENT N/A 09/29/2017   Procedure: AORTIC VALVE REPLACEMENT (AVR) using 27mm Magna Ease valve;  Surgeon: Ivin Poot, MD;  Location: [place];  Service: Open Heart Surgery;  Laterality: N/A;   CARDIAC CATHETERIZATION   07/12/2017   COLONOSCOPY  2008   LEFT AND RIGHT HEART CATHETERIZATION WITH CORONARY ANGIOGRAM N/A 04/02/2014   Procedure: LEFT AND RIGHT HEART CATHETERIZATION WITH CORONARY ANGIOGRAM;  Surgeon: Laverda Page, MD;  Location: [facility] CATH LAB;  Service: Cardiovascular;  Laterality: N/A;   PACEMAKER IMPLANT N/A 10/03/2017   Procedure: PACEMAKER IMPLANT;  Surgeon: Evans Lance, MD;  Location: [place] CV LAB;  Service: Cardiovascular;  Laterality: N/A;   TEE WITHOUT CARDIOVERSION N/A 04/30/2014   TEE WITHOUT CARDIOVERSION N/A 09/29/2017   Procedure: TRANSESOPHAGEAL ECHOCARDIOGRAM (TEE);  Surgeon: Prescott Gum, Collier Salina, MD;  Location: [place];  Service: Open Heart Surgery;  Laterality: N/A;    Family History  Problem Relation Age of Onset   Other Mother        died of old age   Heart disease Father    Sleep apnea Sister    Diabetes Brother        diabetic coma x 3 years   Alcohol abuse Brother    Heart disease Brother 68       MI   Cancer Neg Hx    Stroke Neg Hx    Rectal cancer Neg Hx    Esophageal cancer Neg Hx    Stomach cancer Neg Hx      Current Outpatient Medications:    acetaminophen (TYLENOL) 500 MG tablet, Take 1,000 mg by mouth in the morning and at bedtime., Disp: , Rfl:  amLODipine (NORVASC) 10 MG tablet, TAKE 1 TABLET BY MOUTH EVERY DAY, Disp: 90 tablet, Rfl: 3   fenofibrate (TRICOR) 48 MG tablet, TAKE 1 TABLET BY MOUTH EVERY DAY, Disp: 90 tablet, Rfl: 3   ferrous gluconate (FERGON) 324 MG tablet, TAKE 1 TABLET (324 MG TOTAL) BY MOUTH 2 (TWO) TIMES DAILY WITH A MEAL., Disp: 180 tablet, Rfl: 1   glucose blood test strip, 1 each by Other route as needed for other. Use as instructed to check blood sugars daily, Disp: 100 each, Rfl: 5   JARDIANCE 10 MG TABS tablet, TAKE 1 TABLET BY MOUTH EVERY DAY BEFORE BREAKFAST, Disp: 90 tablet, Rfl: 1   metFORMIN (GLUCOPHAGE) 500 MG tablet, TAKE ONE TABLET BY MOUTH TWICE DAILY WITH MEAL, Disp: 180 tablet, Rfl: 0   metoprolol tartrate  (LOPRESSOR) 50 MG tablet, TAKE 1 TABLET BY MOUTH TWICE A DAY, Disp: 180 tablet, Rfl: 0   Microlet Lancets MISC, 1 each by Does not apply route daily. Use to check blood sugars daily, Disp: 100 each, Rfl: 5   Multiple Vitamins-Minerals (CENTRUM SILVER 50+MEN PO), Take 1 tablet by mouth daily., Disp: , Rfl:    omeprazole (PRILOSEC) 40 MG capsule, Take 1 capsule (40 mg total) by mouth daily., Disp: 90 capsule, Rfl: 0   rosuvastatin (CRESTOR) 20 MG tablet, TAKE 1 TABLET BY MOUTH EVERYDAY AT BEDTIME, Disp: 90 tablet, Rfl: 0   valsartan (DIOVAN) 160 MG tablet, TAKE 1 TABLET BY MOUTH EVERY DAY, Disp: 90 tablet, Rfl: 0   vitamin B-12 (CYANOCOBALAMIN) 50 MCG tablet, Take 50 mcg by mouth daily., Disp: , Rfl:   No Known Allergies  Review of Systems  Constitutional:  Negative for chills, fever, malaise/fatigue and weight loss.  HENT:  Negative for congestion, ear pain, hearing loss, sore throat and tinnitus.   Eyes:  Negative for blurred vision, pain and redness.  Respiratory:  Negative for cough, hemoptysis and shortness of breath.   Cardiovascular:  Negative for chest pain, palpitations, orthopnea, claudication and leg swelling.  Gastrointestinal:  Positive for heartburn. Negative for abdominal pain, blood in stool, constipation, diarrhea, nausea and vomiting.  Genitourinary:  Negative for dysuria, flank pain, frequency, hematuria and urgency.  Musculoskeletal:  Negative for falls, joint pain and myalgias.  Skin:  Negative for itching and rash.  Neurological:  Negative for dizziness, tingling, speech change, weakness and headaches.  Endo/Heme/Allergies:  Negative for polydipsia. Does not bruise/bleed easily.  Psychiatric/Behavioral:  Negative for depression and memory loss. The patient has insomnia. The patient is not nervous/anxious.         02/03/2023    8:19 AM 12/03/2022    9:06 AM 06/03/2022   10:27 AM 01/27/2022   10:18 AM 11/13/2021    8:30 AM  Depression screen PHQ 2/9  Decreased Interest 0  0 0 0 0  Down, Depressed, Hopeless 0 0 0 0 0  PHQ - 2 Score 0 0 0 0 0  Altered sleeping  2     Tired, decreased energy  0     Change in appetite  0     Feeling bad or failure about yourself   0     Trouble concentrating  0     Moving slowly or fidgety/restless  0     Suicidal thoughts  0     PHQ-9 Score  2     Difficult doing work/chores  Not difficult at all           Objective:  BP 120/78  Pulse 86   Ht 5\' 8"  (1.727 m)   Wt 190 lb 6.4 oz (86.4 kg)   BMI 28.95 kg/m   General appearance: alert, no distress, WD/WN, African American male Skin: somewhat dry skin throughout, no worrisome lesions HEENT: normocephalic, conjunctiva/corneas normal, sclerae anicteric, PERRLA, EOMi, nares patent, no discharge or erythema, pharynx normal Oral cavity: MMM, tongue normal, teeth in good repair Neck: supple, no lymphadenopathy, no thyromegaly, no masses, normal ROM, no bruits Chest: vertical surgical scar, non tender, normal shape and expansion Heart: 2/6 brief systolic murmur heard in upper sternal borders, otherwise RRR, normal S1, S2 Lungs: CTA bilaterally, no wheezes, rhonchi, or rales Abdomen: +bs, soft, non tender, non distended, no masses, no hepatomegaly, no splenomegaly, no bruits Back: non tender, normal ROM, no scoliosis Musculoskeletal: upper extremities non tender, no obvious deformity, normal ROM throughout, lower extremities non tender, no obvious deformity, normal ROM throughout Extremities: no edema, no cyanosis, no clubbing Pulses: 2+ symmetric, upper and lower extremities, normal cap refill Neurological: alert, oriented x 3, CN2-12 intact, strength normal upper extremities and lower extremities, sensation normal throughout, DTRs 2+ throughout, no cerebellar signs, gait normal Psychiatric: normal affect, behavior normal, pleasant  GU/rectal - deferred, declined  Diabetic Foot Exam - Simple   Simple Foot Form Diabetic Foot exam was performed with the following findings:  Yes 02/03/2023  9:05 AM  Visual Inspection See comments: Yes Sensation Testing Intact to touch and monofilament testing bilaterally: Yes Pulse Check See comments: Yes Comments 1+ pedal pulses, flat feet, hypertrophic nails throughout but trimmed      Assessment and Plan :   Encounter Diagnoses  Name Primary?   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    S/P AVR    Pacemaker Medtronic Azure XT DR MRI [id number] Dual chamber pacemaker in situ    Former smoker    Advanced directives, counseling/discussion    Nail hypertrophy    Type 2 diabetes mellitus with complication, without long-term current use of insulin ([place])    Hyperlipidemia associated with type 2 diabetes mellitus ([place])    Diabetic mononeuropathy associated with type 2 diabetes mellitus (H[facility])    Gastroesophageal reflux disease without esophagitis    Essential hypertension    PVD (peripheral vascular disease) (HCC)    Atherosclerosis of native artery of both lower extremities with intermittent claudication (HCC)    Aortic atherosclerosis (HCC)    Pleural plaque    Abnormal CT of the chest     This visit was a preventative care visit, also known as wellness visit or routine physical.   Topics typically include healthy lifestyle, diet, exercise, preventative care, vaccinations, sick and well care, proper use of emergency dept and after hours care, as well as other concerns.     Recommendations: Continue to return yearly for your annual wellness and preventative care visits.  This gives [place] a chance to discuss healthy lifestyle, exercise, vaccinations, review your chart record, and perform screenings where appropriate.  I recommend you see your eye doctor yearly for routine vision care. We will call Dr. Gershon Crane office to get him in soon for yearly diabetic eye exam  I recommend you see your dentist yearly for routine dental care including hygiene visits twice  yearly.   Vaccination recommendations were reviewed Immunization History  Administered Date(s) Administered   Fluad Quad(high Dose 65+) 09/09/2020, 09/01/2021, 10/04/2022   Influenza, High Dose Seasonal PF 10/19/2018, 11/01/2019, 11/01/2019  Influenza,inj,Quad PF,6+ Mos 02/03/2016, 11/04/2016   PFIZER(Purple Top)SARS-COV-2 Vaccination 02/19/2020, 03/11/2020, 11/28/2020   Pneumococcal Conjugate-13 06/15/2018   Pneumococcal Polysaccharide-23 11/13/2021    I recommend you get Tdap and Shingrix vaccine at your pharmacy soon   Screening for cancer: Colon cancer screening I reviewed your colonoscopy on file that is up to date from 05/2022  We generally stop doing PSA blood test at age 32  Skin cancer screening: Check your skin regularly for new changes, growing lesions, or other lesions of concern Come in for evaluation if you have skin lesions of concern.  Lung cancer screening: If you have a greater than 20 pack year history of tobacco use, then you may qualify for lung cancer screening with a chest CT scan.   Please call your insurance company to inquire about coverage for this test.  CT angio chest 09/28/21: IMPRESSION: 1. Negative for pulmonary embolism or other acute finding. 2. 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   Aortic Atherosclerosis (ICD10-I70.0).  Plan updated CT scan of chest given prior asbestos plaques.  Let me know if agreeable.  Last scan 2022.   We currently don't have screenings for other cancers besides breast, cervical, colon, and lung cancers.  If you have a strong family history of cancer or have other cancer screening concerns, please let me know.    Bone health: Get at least 150 minutes of aerobic exercise weekly Get weight bearing exercise at least once weekly Bone density test:  A bone density test is an imaging test that uses a type of X-ray to measure the amount of calcium and other minerals in your  bones. The test may be used to diagnose or screen you for a condition that causes weak or thin bones (osteoporosis), predict your risk for a broken bone (fracture), or determine how well your osteoporosis treatment is working. The bone density test is recommended for females 50 and older, or females or males [id number] if certain risk factors such as thyroid disease, long term use of steroids such as for asthma or rheumatological issues, vitamin D deficiency, estrogen deficiency, family history of osteoporosis, self or family history of fragility fracture in first degree relative.    Heart health: Get at least 150 minutes of aerobic exercise weekly Limit alcohol It is important to maintain a healthy blood pressure and healthy cholesterol numbers  Heart disease screening: Screening for heart disease includes screening for blood pressure, fasting lipids, glucose/diabetes screening, BMI height to weight ratio, reviewed of smoking status, physical activity, and diet.    Goals include blood pressure 120/80 or less, maintaining a healthy lipid/cholesterol profile, preventing diabetes or keeping diabetes numbers under good control, not smoking or using tobacco products, exercising most days per week or at least 150 minutes per week of exercise, and eating healthy variety of fruits and vegetables, healthy oils, and avoiding unhealthy food choices like fried food, fast food, high sugar and high cholesterol foods.    Follow up as planned with cardiology  05/2020 carotid US with minimal plaque  11/24/22 showing PVD but some improvement since 2018 study    Medical care options: I recommend you continue to seek care here first for routine care.  We try really hard to have available appointments Monday through Friday daytime hours for sick visits, acute visits, and physicals.  Urgent care should be used for after hours and weekends for significant issues that cannot wait till the next day.  The emergency  department should be used  for significant potentially life-threatening emergencies.  The emergency department is expensive, can often have long wait times for less significant concerns, so try to utilize primary care, urgent care, or telemedicine when possible to avoid unnecessary trips to the emergency department.  Virtual visits and telemedicine have been introduced since the pandemic started in 2020, and can be convenient ways to receive medical care.  We offer virtual appointments as well to assist you in a variety of options to seek medical care.   Advanced Directives - please work on this as we don't have this on file for you: I recommend you consider completing a Health Care Power of Attorney and Living Will.   These documents respect your wishes and help alleviate burdens on your loved ones if you were to become terminally ill or be in a position to need those documents enforced.    You can complete Advanced Directives yourself, have them notarized, then have copies made for our office, for you and for anybody you feel should have them in safe keeping.  Or, you can have an attorney prepare these documents.   If you haven't updated your Last Will and Testament in a while, it may be worthwhile having an attorney prepare these documents together and save on some costs.       Separate significant issues discussed: Continue current medications, continue regular exercise, but avoid heavy lifting or other tree work recently advised by your cardiologist.  Genella Rife - stop famotidine and begin back on Omeprazole    Graison was seen today for fasting cpe.  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  S/P AVR  Pacemaker Medtronic  Azure XT DR MRI [id number] Dual chamber pacemaker in situ  Former smoker -     CT CHEST NODULE FOLLOW UP LOW DOSE W/O; Future  Advanced directives, counseling/discussion  Nail hypertrophy  Type 2 diabetes mellitus with complication, without long-term current use of insulin (HCC) -     Hemoglobin A1c  Hyperlipidemia associated with type 2 diabetes mellitus (HCC) -     NMR, lipoprofile  Diabetic mononeuropathy associated with type 2 diabetes mellitus (HCC)  Gastroesophageal reflux disease without esophagitis  Essential hypertension  PVD (peripheral vascular disease) (HCC)  Atherosclerosis of native artery of both lower extremities with intermittent claudication ([facility]CC) -     NMR, lipoprofile  Aortic atherosclerosis (HCC) -     NMR, lipoprofile  Pleural plaque -     CT CHEST NODULE FOLLOW UP LOW DOSE W/O; Future  Abnormal CT of the chest -     CT CHEST NODULE FOLLOW UP LOW DOSE W/O; Future  Other orders -     omeprazole (PRILOSEC) 40 MG capsule; Take 1 capsule (40 mg total) by mouth daily.     Follow-up pending labs, yearly for physical

## 2023-02-03 NOTE — Patient Instructions (Signed)
Get your tetanus and shingles vaccines at your pharmacy  Consider repeat chest CT for screening given prior abnormal scan in 2022.  Let me know if you want to do this  See eye doctor soon  We will call with labs and recommendations

## 2023-02-04 LAB — COMPREHENSIVE METABOLIC PANEL
ALT: 25 IU/L (ref 0–44)
AST: 23 IU/L (ref 0–40)
Albumin/Globulin Ratio: 1.6 (ref 1.2–2.2)
Albumin: 4.4 g/dL (ref 3.8–4.8)
Alkaline Phosphatase: 81 IU/L (ref 44–121)
BUN/Creatinine Ratio: 12 (ref 10–24)
BUN: 18 mg/dL (ref 8–27)
Bilirubin Total: 0.4 mg/dL (ref 0.0–1.2)
CO2: 22 mmol/L (ref 20–29)
Calcium: 10.6 mg/dL — ABNORMAL HIGH (ref 8.6–10.2)
Chloride: 104 mmol/L (ref 96–106)
Creatinine, Ser: 1.49 mg/dL — ABNORMAL HIGH (ref 0.76–1.27)
Globulin, Total: 2.7 g/dL (ref 1.5–4.5)
Glucose: 130 mg/dL — ABNORMAL HIGH (ref 70–99)
Potassium: 4.1 mmol/L (ref 3.5–5.2)
Sodium: 141 mmol/L (ref 134–144)
Total Protein: 7.1 g/dL (ref 6.0–8.5)
eGFR: 50 mL/min/{1.73_m2} — ABNORMAL LOW (ref >59)

## 2023-02-04 LAB — NMR, LIPOPROFILE
Cholesterol, Total: 190 mg/dL (ref 100–199)
HDL Particle Number: 32.6 umol/L (ref >=30.5)
HDL-C: 42 mg/dL (ref >39)
LDL Particle Number: 1660 nmol/L — ABNORMAL HIGH (ref <1000)
LDL Size: 20.1 nm — ABNORMAL LOW (ref >20.5)
LDL-C (NIH Calc): 122 mg/dL — ABNORMAL HIGH (ref 0–99)
LP-IR Score: 79 — ABNORMAL HIGH (ref <=45)
Small LDL Particle Number: 1081 nmol/L — ABNORMAL HIGH (ref <=527)
Triglycerides: 146 mg/dL (ref 0–149)

## 2023-02-04 LAB — CBC WITH DIFFERENTIAL/PLATELET
Basophils Absolute: 0.1 10*3/uL (ref 0.0–0.2)
Basos: 1 %
EOS (ABSOLUTE): 0.3 10*3/uL (ref 0.0–0.4)
Eos: 4 %
Hematocrit: 45.9 % (ref 37.5–51.0)
Hemoglobin: 15.7 g/dL (ref 13.0–17.7)
Immature Grans (Abs): 0 10*3/uL (ref 0.0–0.1)
Immature Granulocytes: 1 %
Lymphocytes Absolute: 1.8 10*3/uL (ref 0.7–3.1)
Lymphs: 31 %
MCH: 30.8 pg (ref 26.6–33.0)
MCHC: 34.2 g/dL (ref 31.5–35.7)
MCV: 90 fL (ref 79–97)
Monocytes Absolute: 0.6 10*3/uL (ref 0.1–0.9)
Monocytes: 10 %
Neutrophils Absolute: 3.2 10*3/uL (ref 1.4–7.0)
Neutrophils: 53 %
Platelets: 181 10*3/uL (ref 150–450)
RBC: 5.09 x10E6/uL (ref 4.14–5.80)
RDW: 11.7 % (ref 11.6–15.4)
WBC: 6 10*3/uL (ref 3.4–10.8)

## 2023-02-04 LAB — HEMOGLOBIN A1C
Est. average glucose Bld gHb Est-mCnc: 131 mg/dL
Hgb A1c MFr Bld: 6.2 % — ABNORMAL HIGH (ref 4.8–5.6)

## 2023-02-04 NOTE — Progress Notes (Signed)
Still pending cholesterol lab  Kidney marker shows chronic kidney disease but stable, calcium little elevated, diabetes marker stable, blood counts okay  He can cut the iron back ferrous gluconate to once a day instead of twice a day  Given the kidney marker lets have him do metformin 500 mg in the morning but 1/2 tablet in the evening instead of 1 tablet twice a day  Schedule 4 month follow-up fasting

## 2023-02-07 ENCOUNTER — Other Ambulatory Visit: Payer: Self-pay | Admitting: Medical

## 2023-02-07 ENCOUNTER — Telehealth: Payer: Self-pay | Admitting: Internal Medicine

## 2023-02-07 MED ORDER — REPATHA 140 MG/ML ~~LOC~~ SOSY: 140.0000 mg | PREFILLED_SYRINGE | SUBCUTANEOUS | 5 refills | Status: DC

## 2023-02-07 NOTE — Progress Notes (Signed)
Your cholesterol numbers are not at goal  In addition to your current cholesterol medicine, the recommendation will be to add an injectable medicine every 2 weeks to help lower the cholesterol to goal.  Example would be Repatha or Praluent.  This is a pen device that can be used at home, relatively easy to use.  If agreeable and if insurance will cover this, I would like to send this to the pharmacy to give this a try  If agreeable I would probably stop his Tricor or fenofibrate for the time being to see how this works and cannot put too much pressure on the liver  We would recheck labs in 3 months fasting after using this regimen  Let me know if agreeable

## 2023-02-07 NOTE — Telephone Encounter (Signed)
got a fax from [facility] about Jonathan Brown they do not do low dose CT chest anymore. is there someone else you can fax it too

## 2023-02-08 NOTE — Telephone Encounter (Signed)
Caryn has resent to [employer]

## 2023-02-10 DIAGNOSIS — I442 Atrioventricular block, complete: Secondary | ICD-10-CM | POA: Diagnosis not present

## 2023-02-10 DIAGNOSIS — Z45018 Encounter for adjustment and management of other part of cardiac pacemaker: Secondary | ICD-10-CM | POA: Diagnosis not present

## 2023-02-17 ENCOUNTER — Other Ambulatory Visit: Payer: Self-pay | Admitting: Medical

## 2023-02-17 DIAGNOSIS — E118 Type 2 diabetes mellitus with unspecified complications: Secondary | ICD-10-CM

## 2023-02-18 ENCOUNTER — Telehealth: Payer: Self-pay | Admitting: Internal Medicine

## 2023-02-18 NOTE — Telephone Encounter (Signed)
Pt came by and states this his injectable medication doesn't think is covered by his insurance. I advised him I would send to you for verification

## 2023-03-03 NOTE — Telephone Encounter (Signed)
P.A. completed for Repatha & chart notes sent in

## 2023-03-03 NOTE — Telephone Encounter (Signed)
[place] not covered requires P.A., they will fax rejection

## 2023-03-14 ENCOUNTER — Other Ambulatory Visit: Payer: Self-pay | Admitting: Medical

## 2023-03-14 DIAGNOSIS — M2062 Acquired deformities of toe(s), unspecified, left foot: Secondary | ICD-10-CM | POA: Diagnosis not present

## 2023-03-14 DIAGNOSIS — M19079 Primary osteoarthritis, unspecified ankle and foot: Secondary | ICD-10-CM | POA: Diagnosis not present

## 2023-03-14 DIAGNOSIS — M7741 Metatarsalgia, right foot: Secondary | ICD-10-CM | POA: Diagnosis not present

## 2023-03-14 DIAGNOSIS — R269 Unspecified abnormalities of gait and mobility: Secondary | ICD-10-CM | POA: Diagnosis not present

## 2023-03-14 DIAGNOSIS — M7742 Metatarsalgia, left foot: Secondary | ICD-10-CM | POA: Diagnosis not present

## 2023-03-14 NOTE — Telephone Encounter (Signed)
Has an appt in June

## 2023-03-15 ENCOUNTER — Telehealth: Payer: Self-pay

## 2023-03-15 NOTE — Progress Notes (Signed)
Patient ID: Jonathan Brown, male   DOB: Oct 30, 1951, 72 y.o.   MRN: [id number] Care Management & Coordination Services Pharmacy Team  Reason for Encounter: Diabetes  Contacted patient to discuss diabetes disease state. Unsuccessful outreach. Left voicemail for patient to return call.  Current antihyperglycemic regimen:  Jardiance 10 mg 1 tablet daily  Metformin 500 mg 1 tablet twice daily      Chart Updates:  Recent office visits:  None  Recent consult visits:  01/17/23 Gardiner Barefoot, DPM (Podiatry) - Patient presented for Pain due to onychomycosis of nail and other concerns. No medication changes.  Hospital visits:  None in previous 6 months  Medications: Outpatient Encounter Medications as of 03/15/2023  Medication Sig   acetaminophen (TYLENOL) 500 MG tablet Take 1,000 mg by mouth in the morning and at bedtime.   amLODipine (NORVASC) 10 MG tablet TAKE 1 TABLET BY MOUTH EVERY DAY   Evolocumab (REPATHA) 140 MG/ML SOSY Inject 140 mg into the skin every 14 (fourteen) days.   fenofibrate (TRICOR) 48 MG tablet TAKE 1 TABLET BY MOUTH EVERY DAY   ferrous gluconate (FERGON) 324 MG tablet TAKE 1 TABLET (324 MG TOTAL) BY MOUTH 2 (TWO) TIMES DAILY WITH A MEAL.   glucose blood test strip 1 each by Other route as needed for other. Use as instructed to check blood sugars daily   JARDIANCE 10 MG TABS tablet TAKE 1 TABLET BY MOUTH EVERY DAY BEFORE BREAKFAST   metFORMIN (GLUCOPHAGE) 500 MG tablet TAKE ONE TABLET BY MOUTH TWICE DAILY WITH MEAL   metoprolol tartrate (LOPRESSOR) 50 MG tablet TAKE 1 TABLET BY MOUTH TWICE A DAY   Microlet Lancets MISC 1 each by Does not apply route daily. Use to check blood sugars daily   Multiple Vitamins-Minerals (CENTRUM SILVER 50+MEN PO) Take 1 tablet by mouth daily.   omeprazole (PRILOSEC) 40 MG capsule Take 1 capsule (40 mg total) by mouth daily.   rosuvastatin (CRESTOR) 20 MG tablet TAKE 1 TABLET BY MOUTH EVERYDAY AT BEDTIME   valsartan (DIOVAN) 160 MG  tablet TAKE 1 TABLET BY MOUTH EVERY DAY   vitamin B-12 (CYANOCOBALAMIN) 50 MCG tablet Take 50 mcg by mouth daily.   No facility-administered encounter medications on file as of 03/15/2023.    Recent Relevant Labs: Lab Results  Component Value Date/Time   HGBA1C 6.2 (H) 02/03/2023 08:44 AM   HGBA1C 5.7 (A) 10/04/2022 02:38 PM   HGBA1C 6.0 (H) 06/03/2022 11:12 AM   MICROALBUR 0.2 03/14/2017 11:02 AM   MICROALBUR 1.8 02/03/2016 12:01 AM    Kidney Function Lab Results  Component Value Date/Time   CREATININE 1.49 (H) 02/03/2023 08:44 AM   CREATININE 1.29 (H) 07/05/2022 01:46 PM   CREATININE 1.07 03/14/2017 11:02 AM   CREATININE 1.05 06/23/2016 03:50 PM   GFRNONAA >60 09/28/2021 05:49 AM   GFRAA 63 12/11/2020 10:08 AM    Star Rating Drugs:  Valsartan 160 mg  - [facility]22 [facility]                  Jardiance 10 mg - Last filled [facility]/12/23 [facility]  Metformin 500 mg - Last filled [facility]/18/23 [facility] Rosuvastatin 20 mg - Last filled 11/26/22 [facility] Verified   Care Gaps: [place]- 02/03/23     Ned Clines [place] Clinical Pharmacist Assistant [phone]

## 2023-04-08 ENCOUNTER — Other Ambulatory Visit: Payer: Self-pay | Admitting: Medical

## 2023-04-15 ENCOUNTER — Telehealth: Payer: Self-pay | Admitting: Medical

## 2023-04-15 NOTE — Telephone Encounter (Signed)
P.A. REPATHA completed & approved til 09/03/23

## 2023-05-06 ENCOUNTER — Telehealth: Payer: Self-pay | Admitting: Internal Medicine

## 2023-05-06 DIAGNOSIS — E782 Mixed hyperlipidemia: Secondary | ICD-10-CM

## 2023-05-06 NOTE — Telephone Encounter (Signed)
Pt called and state that they are having issues with using repatha and they just can't inject this. They want to know if you can put him back on pill form. He is due 17th for his repatha injection.

## 2023-05-09 MED ORDER — ROSUVASTATIN CALCIUM 20 MG PO TABS: ORAL_TABLET | ORAL | 1 refills | Status: DC

## 2023-05-09 MED ORDER — FERROUS GLUCONATE 324 (38 FE) MG PO TABS: 324.0000 mg | ORAL_TABLET | Freq: Two times a day (BID) | ORAL | 1 refills | Status: DC

## 2023-05-09 MED ORDER — VALSARTAN 160 MG PO TABS: 160.0000 mg | ORAL_TABLET | Freq: Every day | ORAL | 1 refills | Status: DC

## 2023-05-09 MED ORDER — OMEPRAZOLE 40 MG PO CPDR: 40.0000 mg | DELAYED_RELEASE_CAPSULE | Freq: Every day | ORAL | 1 refills | Status: DC

## 2023-05-09 MED ORDER — FENOFIBRATE 48 MG PO TABS: 48.0000 mg | ORAL_TABLET | Freq: Every day | ORAL | 1 refills | Status: DC

## 2023-05-09 MED ORDER — METOPROLOL TARTRATE 50 MG PO TABS: 50.0000 mg | ORAL_TABLET | Freq: Two times a day (BID) | ORAL | 1 refills | Status: DC

## 2023-05-09 NOTE — Telephone Encounter (Signed)
Pt was notified and states that the device is too complicated for them. Pt would like to go with pills

## 2023-05-09 NOTE — Addendum Note (Signed)
Addended by: Herminio Commons A on: 05/09/2023 04:54 PM   Modules accepted: Orders

## 2023-05-09 NOTE — Telephone Encounter (Addendum)
Pt was needing refills on meds as well. Per shane ok to refill for 6 months on meds. Pt was advised to d/c reptha

## 2023-05-10 ENCOUNTER — Telehealth: Payer: Self-pay

## 2023-05-10 NOTE — Progress Notes (Signed)
Patient ID: Jonathan Brown, male   DOB: January 23, 1951, 72 y.o.   MRN: [id number]  Care Management & Coordination Services Pharmacy Team  Reason for Encounter: Diabetes  Contacted patient to discuss diabetes disease state. Spoke with WIfe on 05/11/2023   Current antihyperglycemic regimen:  Jardiance 10 mg 1 tablet daily  Metformin 500 mg 1 tablet twice daily   Patient verbally confirms he is taking the above medications as directed. Yes  What diet changes have been made to improve diabetes control? Wife reports no changes   Have there been any recent hospitalizations or ED visits since last visit with PharmD? No  Patient denies hypoglycemic symptoms, including None  Patient denies hyperglycemic symptoms, including none  How often are you checking your blood sugar? Not at all per wife  Wife reports they are still without a device to use for checking of his blood sugars. Wife states they had been using an old one of hers that she can not locate at present. They would like a glucometer that is as simple as possible and not complicated with a small lancet or he will be hesitant to check. Patient is due to follow up with PCP 06/06/23 forwarded to pharmacist to see if one can be ordered for him prior to visit.   05/25/23 [facility] diabetes testing supplies as one touch is preferred with insurance was advised they are still waiting on the products to come in as they have been ordered.    Adherence Review: Is the patient currently on a STATIN medication? Yes Is the patient currently on ACE/ARB medication? No Does the patient have >5 day gap between last estimated fill dates? Yes    Chart Updates:  Recent office visits:  None   Recent consult visits:  None   Hospital visits:  None in previous 6 months  Medications: Outpatient Encounter Medications as of 05/10/2023  Medication Sig   acetaminophen (TYLENOL) 500 MG tablet Take 1,000 mg by mouth in the morning and  at bedtime.   amLODipine (NORVASC) 10 MG tablet TAKE 1 TABLET BY MOUTH EVERY DAY   fenofibrate (TRICOR) 48 MG tablet Take 1 tablet (48 mg total) by mouth daily.   ferrous gluconate (FERGON) 324 MG tablet Take 1 tablet (324 mg total) by mouth 2 (two) times daily with a meal.   glucose blood test strip 1 each by Other route as needed for other. Use as instructed to check blood sugars daily   JARDIANCE 10 MG TABS tablet TAKE 1 TABLET BY MOUTH EVERY DAY BEFORE BREAKFAST   metFORMIN (GLUCOPHAGE) 500 MG tablet TAKE ONE TABLET BY MOUTH TWICE DAILY WITH MEAL   metoprolol tartrate (LOPRESSOR) 50 MG tablet Take 1 tablet (50 mg total) by mouth 2 (two) times daily.   Microlet Lancets MISC 1 each by Does not apply route daily. Use to check blood sugars daily   Multiple Vitamins-Minerals (CENTRUM SILVER 50+MEN PO) Take 1 tablet by mouth daily.   omeprazole (PRILOSEC) 40 MG capsule Take 1 capsule (40 mg total) by mouth daily.   rosuvastatin (CRESTOR) 20 MG tablet TAKE 1 TABLET BY MOUTH EVERYDAY AT BEDTIME   valsartan (DIOVAN) 160 MG tablet Take 1 tablet (160 mg total) by mouth daily.   vitamin B-12 (CYANOCOBALAMIN) 50 MCG tablet Take 50 mcg by mouth daily.   No facility-administered encounter medications on file as of 05/10/2023.    Recent Relevant Labs: Lab Results  Component Value Date/Time   HGBA1C 6.2 (H)  02/03/2023 08:44 AM   HGBA1C 5.7 (A) 10/04/2022 02:38 PM   HGBA1C 6.0 (H) 06/03/2022 11:12 AM   MICROALBUR 0.2 03/14/2017 11:02 AM   MICROALBUR 1.8 02/03/2016 12:01 AM    Kidney Function Lab Results  Component Value Date/Time   CREATININE 1.49 (H) 02/03/2023 08:44 AM   CREATININE 1.29 (H) 07/05/2022 01:46 PM   CREATININE 1.07 03/14/2017 11:02 AM   CREATININE 1.05 06/23/2016 03:50 PM   GFRNONAA >60 09/28/2021 05:49 AM   GFRAA 63 12/11/2020 10:08 AM    Star Rating Drugs:  Valsartan 160 mg  - [facility]                  Rosuvastatin 20 mg - Last [facility] Jardiance 10 mg - Last [facility]/23 [facility]  Metformin 500 mg - Last [facility]/18/23 [facility]      Care Gaps: Eye Exam - Overdue COVID Booster - Overdue Zoster Vaccine - Overdue AWV - 02/03/23  Pamala Duffel CMA Clinical Pharmacist Assistant [phone]

## 2023-05-11 ENCOUNTER — Other Ambulatory Visit: Payer: Self-pay | Admitting: Medical

## 2023-05-11 DIAGNOSIS — E118 Type 2 diabetes mellitus with unspecified complications: Secondary | ICD-10-CM

## 2023-05-12 ENCOUNTER — Telehealth: Payer: Self-pay | Admitting: Internal Medicine

## 2023-05-12 DIAGNOSIS — I442 Atrioventricular block, complete: Secondary | ICD-10-CM | POA: Diagnosis not present

## 2023-05-12 DIAGNOSIS — Z45018 Encounter for adjustment and management of other part of cardiac pacemaker: Secondary | ICD-10-CM | POA: Diagnosis not present

## 2023-05-12 MED ORDER — LANCETS MISC. MISC: 1 refills | Status: DC

## 2023-05-12 MED ORDER — BLOOD GLUCOSE TEST VI STRP: ORAL_STRIP | 1 refills | Status: DC

## 2023-05-12 MED ORDER — BLOOD GLUCOSE MONITORING SUPPL DEVI: 0 refills | Status: DC

## 2023-05-12 MED ORDER — LANCET DEVICE MISC: 0 refills | Status: DC

## 2023-05-12 NOTE — Telephone Encounter (Signed)
-----   Message from Sherrill Raring, [facility] sent at 05/12/2023  8:51 AM EDT ----- Regarding: Diabetic Meter and Supplies Hello,  Patient is requesting a prescription for Accu Chek Guide Meter and the test strips/lancets to use with it.  [id number] [place], [place] - [address]  Phone: [phone] Fax: [phone]   Thank you, Sherrill Raring Clinical Pharmacist [phone]

## 2023-05-12 NOTE — Telephone Encounter (Signed)
done

## 2023-05-18 ENCOUNTER — Ambulatory Visit: Payer: PPO | Admitting: Podiatry

## 2023-05-19 ENCOUNTER — Telehealth: Payer: Self-pay | Admitting: Medical

## 2023-05-19 DIAGNOSIS — E118 Type 2 diabetes mellitus with unspecified complications: Secondary | ICD-10-CM

## 2023-05-19 NOTE — Telephone Encounter (Signed)
Recv'd rejection for all the diabetic supplies & monitor.  Called pharmacy and One touch is preferred, they could not verbally take the change.  Please [facility] ALL [facility]

## 2023-05-20 MED ORDER — BLOOD GLUCOSE TEST VI STRP
ORAL_STRIP | 1 refills | Status: DC
Start: 2023-05-20 — End: 2024-09-21

## 2023-05-20 MED ORDER — LANCET DEVICE MISC
0 refills | Status: DC
Start: 2023-05-20 — End: 2024-09-21

## 2023-05-20 MED ORDER — BLOOD GLUCOSE MONITORING SUPPL DEVI: 0 refills | Status: DC

## 2023-05-20 MED ORDER — LANCETS MISC. MISC
1 refills | Status: DC
Start: 2023-05-20 — End: 2024-09-21

## 2023-05-20 NOTE — Telephone Encounter (Signed)
Resent in to get onetouch meter and one touch lancets and one touch test strips

## 2023-05-24 ENCOUNTER — Other Ambulatory Visit: Payer: Self-pay | Admitting: Internal Medicine

## 2023-05-25 ENCOUNTER — Ambulatory Visit (INDEPENDENT_AMBULATORY_CARE_PROVIDER_SITE_OTHER): Payer: PPO | Admitting: Podiatry

## 2023-05-25 ENCOUNTER — Encounter: Payer: Self-pay | Admitting: Podiatry

## 2023-05-25 DIAGNOSIS — E1141 Type 2 diabetes mellitus with diabetic mononeuropathy: Secondary | ICD-10-CM | POA: Diagnosis not present

## 2023-05-25 DIAGNOSIS — B351 Tinea unguium: Secondary | ICD-10-CM

## 2023-05-25 DIAGNOSIS — M79609 Pain in unspecified limb: Secondary | ICD-10-CM | POA: Diagnosis not present

## 2023-05-25 DIAGNOSIS — I739 Peripheral vascular disease, unspecified: Secondary | ICD-10-CM

## 2023-05-25 NOTE — Progress Notes (Signed)
This patient returns to my office for at risk foot care.  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  This patient is unable to cut nails himself since the patient cannot reach his nails.These nails are painful walking and wearing shoes.  This patient presents for at risk foot care today.  General Appearance  Alert, conversant and in no acute stress.  Vascular  Dorsalis pedis and posterior tibial  pulses are  weakly palpable  bilaterally.  Capillary return is within normal limits  bilaterally. Temperature is within normal limits  bilaterally.  Neurologic  Senn-Weinstein monofilament wire test within normal limits  bilaterally. Muscle power within normal limits bilaterally.  Nails Thick disfigured discolored nails with subungual debris  from hallux to fifth toes bilaterally. No evidence of bacterial infection or drainage bilaterally.  Orthopedic  No limitations of motion  feet .  No crepitus or effusions noted.  No bony pathology or digital deformities noted.  Skin  normotropic skin with no porokeratosis noted bilaterally.  No signs of infections or ulcers noted.     Onychomycosis  Pain in right toes  Pain in left toes  Consent was obtained for treatment procedures.   Mechanical debridement of nails 1-5  bilaterally performed with a nail nipper.  Filed with dremel without incident.    Return office visit    3 months                 Told patient to return for periodic foot care and evaluation due to potential at risk complications.   Helane Gunther DPM

## 2023-06-06 ENCOUNTER — Encounter: Payer: PPO | Admitting: Medical

## 2023-06-20 ENCOUNTER — Encounter: Payer: Self-pay | Admitting: Medical

## 2023-08-04 ENCOUNTER — Other Ambulatory Visit: Payer: Self-pay | Admitting: Cardiology

## 2023-08-04 DIAGNOSIS — I10 Essential (primary) hypertension: Secondary | ICD-10-CM

## 2023-08-05 DIAGNOSIS — H35033 Hypertensive retinopathy, bilateral: Secondary | ICD-10-CM | POA: Diagnosis not present

## 2023-08-05 DIAGNOSIS — H401111 Primary open-angle glaucoma, right eye, mild stage: Secondary | ICD-10-CM | POA: Diagnosis not present

## 2023-08-05 DIAGNOSIS — H04123 Dry eye syndrome of bilateral lacrimal glands: Secondary | ICD-10-CM | POA: Diagnosis not present

## 2023-08-05 DIAGNOSIS — H401123 Primary open-angle glaucoma, left eye, severe stage: Secondary | ICD-10-CM | POA: Diagnosis not present

## 2023-08-11 DIAGNOSIS — Z45018 Encounter for adjustment and management of other part of cardiac pacemaker: Secondary | ICD-10-CM | POA: Diagnosis not present

## 2023-08-11 DIAGNOSIS — I442 Atrioventricular block, complete: Secondary | ICD-10-CM | POA: Diagnosis not present

## 2023-08-12 DIAGNOSIS — H25811 Combined forms of age-related cataract, right eye: Secondary | ICD-10-CM | POA: Diagnosis not present

## 2023-08-12 DIAGNOSIS — H524 Presbyopia: Secondary | ICD-10-CM | POA: Diagnosis not present

## 2023-08-13 ENCOUNTER — Other Ambulatory Visit: Payer: Self-pay | Admitting: Medical

## 2023-08-13 DIAGNOSIS — E118 Type 2 diabetes mellitus with unspecified complications: Secondary | ICD-10-CM

## 2023-08-15 NOTE — Telephone Encounter (Signed)
Will call patient after 10am when patient gets up

## 2023-08-15 NOTE — Telephone Encounter (Signed)
Left message for pt to call back to schedule a visit 

## 2023-08-16 NOTE — Telephone Encounter (Signed)
Pt doesn't need a refill. Pt has an appt next week

## 2023-08-23 ENCOUNTER — Ambulatory Visit: Payer: PPO | Admitting: Medical

## 2023-08-23 VITALS — BP 138/78 | HR 79 | Wt 189.6 lb

## 2023-08-23 DIAGNOSIS — H269 Unspecified cataract: Secondary | ICD-10-CM

## 2023-08-23 DIAGNOSIS — Z87891 Personal history of nicotine dependence: Secondary | ICD-10-CM

## 2023-08-23 DIAGNOSIS — E1169 Type 2 diabetes mellitus with other specified complication: Secondary | ICD-10-CM

## 2023-08-23 DIAGNOSIS — R0689 Other abnormalities of breathing: Secondary | ICD-10-CM

## 2023-08-23 DIAGNOSIS — I70213 Atherosclerosis of native arteries of extremities with intermittent claudication, bilateral legs: Secondary | ICD-10-CM

## 2023-08-23 DIAGNOSIS — Q845 Enlarged and hypertrophic nails: Secondary | ICD-10-CM

## 2023-08-23 DIAGNOSIS — E785 Hyperlipidemia, unspecified: Secondary | ICD-10-CM

## 2023-08-23 DIAGNOSIS — D649 Anemia, unspecified: Secondary | ICD-10-CM

## 2023-08-23 DIAGNOSIS — E118 Type 2 diabetes mellitus with unspecified complications: Secondary | ICD-10-CM

## 2023-08-23 DIAGNOSIS — I10 Essential (primary) hypertension: Secondary | ICD-10-CM | POA: Diagnosis not present

## 2023-08-23 DIAGNOSIS — I739 Peripheral vascular disease, unspecified: Secondary | ICD-10-CM

## 2023-08-23 DIAGNOSIS — Z23 Encounter for immunization: Secondary | ICD-10-CM | POA: Diagnosis not present

## 2023-08-23 DIAGNOSIS — R9389 Abnormal findings on diagnostic imaging of other specified body structures: Secondary | ICD-10-CM | POA: Diagnosis not present

## 2023-08-23 MED ORDER — EMPAGLIFLOZIN 10 MG PO TABS: 10.0000 mg | ORAL_TABLET | Freq: Every day | ORAL | 2 refills | Status: DC

## 2023-08-23 MED ORDER — FERROUS GLUCONATE 324 (38 FE) MG PO TABS: 324.0000 mg | ORAL_TABLET | Freq: Every day | ORAL | 2 refills | Status: DC

## 2023-08-23 MED ORDER — ROSUVASTATIN CALCIUM 40 MG PO TABS: 40.0000 mg | ORAL_TABLET | Freq: Every day | ORAL | 2 refills | Status: DC

## 2023-08-23 MED ORDER — METFORMIN HCL 500 MG PO TABS
ORAL_TABLET | ORAL | 2 refills | Status: DC
Start: 2023-08-23 — End: 2024-05-28

## 2023-08-23 NOTE — Progress Notes (Signed)
Subjective:  Jonathan Brown is a 72 y.o. male who presents for Chief Complaint  Patient presents with   med check    Med check, flu shot     Here for med check. He is alone today.  Back in February made several recommendations.  His kidney marker at that time warranted making some changes in his medication.  At that time we changed metformin 500 mg to one half in the evening and 1 in the morning instead of 1 twice a day.  At that visit we had him cut back on iron to once a day instead of twice a day.  At that visit we also recommend a every 2-week injectable medicine such as Repatha for lipids.  Looking back in the record they never called back to discuss this with the nurse so he is currently still on the same lipid medicine he was on prior  He notes compliance with current medication.  He is having cataract surgery soon and sees podiatry soon for toenails.  No other aggravating or relieving factors.    No other c/o.  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     amLODipine (NORVASC) 10 MG tablet TAKE 1 TABLET BY MOUTH EVERY DAY 90 tablet 3   Blood Glucose Monitoring Suppl DEVI Test 1-2 times day. Needs onetouch meter 1 each 0   fenofibrate (TRICOR) 48 MG tablet Take 1 tablet (48 mg total) by mouth daily. 90 tablet 1   Glucose Blood (BLOOD GLUCOSE TEST STRIPS) STRP Test 1-2 times daily. Uses onetouch meter 100 strip 1   Lancet Device MISC Use device to put lancet in 1 each 0   Lancets Misc. MISC 1-2 times a day. Needs onetouch lancets 100 each 1   metoprolol tartrate (LOPRESSOR) 50 MG tablet Take 1 tablet (50 mg total) by mouth 2 (two) times daily. 180 tablet 1   Multiple Vitamins-Minerals (CENTRUM SILVER 50+MEN PO) Take 1 tablet by mouth daily.     omeprazole (PRILOSEC) 40 MG capsule Take 1 capsule (40 mg total) by mouth daily. 90 capsule 1   valsartan (DIOVAN) 160 MG tablet Take 1 tablet (160 mg total) by mouth daily. 90 tablet 1   vitamin B-12 (CYANOCOBALAMIN) 50 MCG tablet Take 50 mcg by mouth daily.     No current facility-administered medications on file prior to visit.     The following portions  of the patient's history were reviewed and updated as appropriate: allergies, current medications, past family history, past medical history, past social history, past surgical history and problem list.  ROS Otherwise as in subjective above  Objective: BP 138/78   Pulse 79   Wt 189 lb 9.6 oz (86 kg)   BMI 28.83 kg/m   BP Readings from Last 3 Encounters:  08/23/23 138/78  02/03/23 120/78  12/03/22 118/64   Wt Readings from Last 3 Encounters:  08/23/23 189 lb 9.6 oz (86 kg)  02/03/23 190 lb 6.4 oz (86.4 kg)  12/03/22 190 lb 9.6 oz (86.5 kg)   General appearance: alert, no distress, well developed, well nourished Heart: RRR, normal S1, S2, no murmurs Lungs: CTA bilaterally, no wheezes, rhonchi, or rales Pulses: 2+ radial pulses, 2+ pedal pulses, normal cap refill Ext: no edema  Diabetic Foot Exam - Simple    Simple Foot Form Diabetic Foot exam was performed with the following findings: Yes 08/23/2023 10:40 AM  Visual Inspection See comments: Yes Sensation Testing Intact to touch and monofilament testing bilaterally: Yes Pulse Check See comments: Yes Comments 1+ pedal pulses,hypertrophic toenails bilateral     Assessment: Encounter Diagnoses  Name Primary?   Type 2 diabetes mellitus with complication, without long-term current use of insulin (HCC) Yes   PVD (peripheral vascular disease) (HCC)    Essential hypertension    Hyperlipidemia associated with type 2 diabetes mellitus (HCC)    Atherosclerosis of native artery of both lower extremities with intermittent claudication (HCC)    Anemia, unspecified type    Serum calcium elevated    Need for influenza vaccination    Cataract, unspecified cataract type, unspecified laterality    Enlarged and hypertrophic nails    Decreased lung sounds    Former smoker    Abnormal CT of the chest      Plan: Diabetes Continue metformin 500 mg  1 in morning, 1/2 in evening Continue Jardiance 10 mg daily Continue glucose testing   High cholesterol increase Crestor to 40 mg daily Fenofibrate 48 mg daily   Hypertension Continue Amlodipine 10mg  daily Continue Metoprolol 50 mg twice daily Continue Valsartan 160 mg daily   Anemia You can change your Fergon ferrous gluconate to once daily instead of twice daily   Counseled on the influenza virus vaccine.  Vaccine information sheet given.  Influenza vaccine given after consent obtained.   Cataract - follow up with eye doctor soon as planned   Hypertrophic nails - sees podiatry this week for routine diabetic care and nail care.  Referral for CT chest given prior abnormal chest CT and former smoker.  No current dyspnea on exertion or breathing issues   Jonathan Brown was seen today for med check.  Diagnoses and all orders for this visit:  Type 2 diabetes mellitus with complication,  without long-term current use of insulin (HCC) -     metFORMIN (GLUCOPHAGE) 500 MG tablet; 1 tablet in morning, 1/2 tablet in evening -     Hemoglobin A1c  PVD (peripheral vascular disease) (HCC)  Essential hypertension  Hyperlipidemia associated with type 2 diabetes mellitus (HCC)  Atherosclerosis of native artery of both lower extremities with intermittent claudication (HCC)  Anemia, unspecified type  Serum calcium elevated -     PTH, Intact and Calcium -     Basic metabolic panel -     TSH  Need for influenza vaccination  Cataract, unspecified cataract type, unspecified laterality  Enlarged and hypertrophic nails  Decreased lung sounds -  CT Chest W Contrast; Future  Former smoker -     CT Chest W Contrast; Future  Abnormal CT of the chest -     CT Chest W Contrast; Future  Other orders -     ferrous gluconate (FERGON) 324 MG tablet; Take 1 tablet (324 mg total) by mouth daily with breakfast. -     empagliflozin (JARDIANCE) 10 MG TABS tablet; Take 1 tablet (10 mg total) by mouth daily. -     rosuvastatin (CRESTOR) 40 MG tablet; Take 1 tablet (40 mg total) by mouth daily.   Follow up: pending labs

## 2023-08-23 NOTE — Addendum Note (Signed)
Addended by: Herminio Commons A on: 08/23/2023 10:52 AM   Modules accepted: Orders

## 2023-08-23 NOTE — Patient Instructions (Signed)
Diabetes Continue metformin 500 mg  1 in morning, 1/2 in evening Continue Jardiance 10 mg daily Continue glucose testing   High cholesterol increase Crestor to 40 mg daily Fenofibrate 48 mg daily   Hypertension Continue Amlodipine 10mg  daily Continue Metoprolol 50 mg twice daily Continue Valsartan 160 mg daily   Anemia You can change your Fergon ferrous gluconate to once daily instead of twice daily   Counseled on the influenza virus vaccine.  Vaccine information sheet given.  Influenza vaccine given after consent obtained.   Cataract - follow up with eye doctor soon as planned   Hypertrophic nails - sees podiatry this week for routine diabetic care and nail care.  Referral for CT chest given prior abnormal chest CT and former smoker.  No current dyspnea on exertion or breathing issues

## 2023-08-24 LAB — BASIC METABOLIC PANEL
BUN/Creatinine Ratio: 11 (ref 10–24)
BUN: 14 mg/dL (ref 8–27)
CO2: 17 mmol/L — ABNORMAL LOW (ref 20–29)
Calcium: 10.2 mg/dL (ref 8.6–10.2)
Chloride: 110 mmol/L — ABNORMAL HIGH (ref 96–106)
Creatinine, Ser: 1.26 mg/dL (ref 0.76–1.27)
Glucose: 146 mg/dL — ABNORMAL HIGH (ref 70–99)
Potassium: 4.2 mmol/L (ref 3.5–5.2)
Sodium: 142 mmol/L (ref 134–144)
eGFR: 61 mL/min/{1.73_m2} (ref >59)

## 2023-08-24 LAB — PTH, INTACT AND CALCIUM: PTH: 50 pg/mL (ref 15–65)

## 2023-08-24 LAB — HEMOGLOBIN A1C
Est. average glucose Bld gHb Est-mCnc: 117 mg/dL
Hgb A1c MFr Bld: 5.7 % — ABNORMAL HIGH (ref 4.8–5.6)

## 2023-08-24 LAB — TSH: TSH: 1.51 u[IU]/mL (ref 0.450–4.500)

## 2023-08-24 NOTE — Progress Notes (Signed)
Labs are stable.  Continue current medication.  Expect a phone call about the CT chest test

## 2023-08-25 ENCOUNTER — Encounter: Payer: Self-pay | Admitting: Podiatry

## 2023-08-25 ENCOUNTER — Ambulatory Visit (INDEPENDENT_AMBULATORY_CARE_PROVIDER_SITE_OTHER): Payer: PPO | Admitting: Podiatry

## 2023-08-25 DIAGNOSIS — M79609 Pain in unspecified limb: Secondary | ICD-10-CM

## 2023-08-25 DIAGNOSIS — E1141 Type 2 diabetes mellitus with diabetic mononeuropathy: Secondary | ICD-10-CM

## 2023-08-25 DIAGNOSIS — I739 Peripheral vascular disease, unspecified: Secondary | ICD-10-CM | POA: Diagnosis not present

## 2023-08-25 DIAGNOSIS — B351 Tinea unguium: Secondary | ICD-10-CM

## 2023-08-25 NOTE — Progress Notes (Signed)
This patient returns to my office for at risk foot care.  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  This patient is unable to cut nails himself since the patient cannot reach his nails.These nails are painful walking and wearing shoes.  This patient presents for at risk foot care today.  General Appearance  Alert, conversant and in no acute stress.  Vascular  Dorsalis pedis and posterior tibial  pulses are  weakly palpable  bilaterally.  Capillary return is within normal limits  bilaterally. Temperature is within normal limits  bilaterally.  Neurologic  Senn-Weinstein monofilament wire test within normal limits  bilaterally. Muscle power within normal limits bilaterally.  Nails Thick disfigured discolored nails with subungual debris  from hallux to fifth toes bilaterally. No evidence of bacterial infection or drainage bilaterally.  Orthopedic  No limitations of motion  feet .  No crepitus or effusions noted.  No bony pathology or digital deformities noted.  Skin  normotropic skin with no porokeratosis noted bilaterally.  No signs of infections or ulcers noted.     Onychomycosis  Pain in right toes  Pain in left toes  Consent was obtained for treatment procedures.   Mechanical debridement of nails 1-5  bilaterally performed with a nail nipper.  Filed with dremel without incident.    Return office visit    3 months                 Told patient to return for periodic foot care and evaluation due to potential at risk complications.   Helane Gunther DPM

## 2023-09-05 DIAGNOSIS — H401123 Primary open-angle glaucoma, left eye, severe stage: Secondary | ICD-10-CM | POA: Diagnosis not present

## 2023-09-05 DIAGNOSIS — H401111 Primary open-angle glaucoma, right eye, mild stage: Secondary | ICD-10-CM | POA: Diagnosis not present

## 2023-09-22 ENCOUNTER — Ambulatory Visit
Admission: RE | Admit: 2023-09-22 | Discharge: 2023-09-22 | Disposition: A | Discharge status: Home / Self Care | Payer: PPO | Source: Ambulatory Visit | Attending: Medical | Admitting: Medical

## 2023-09-22 DIAGNOSIS — R9389 Abnormal findings on diagnostic imaging of other specified body structures: Secondary | ICD-10-CM

## 2023-09-22 DIAGNOSIS — Z77090 Contact with and (suspected) exposure to asbestos: Secondary | ICD-10-CM | POA: Diagnosis not present

## 2023-09-22 DIAGNOSIS — Z87891 Personal history of nicotine dependence: Secondary | ICD-10-CM

## 2023-09-22 DIAGNOSIS — R0689 Other abnormalities of breathing: Secondary | ICD-10-CM

## 2023-09-22 MED ORDER — IOPAMIDOL (ISOVUE-300) INJECTION 61%
500.0000 mL | Freq: Once | INTRAVENOUS | Status: AC | PRN
  Administered 2023-09-22: 80 mL via INTRAVENOUS

## 2023-10-05 DIAGNOSIS — H401123 Primary open-angle glaucoma, left eye, severe stage: Secondary | ICD-10-CM | POA: Diagnosis not present

## 2023-10-05 DIAGNOSIS — H5709 Other anomalies of pupillary function: Secondary | ICD-10-CM | POA: Diagnosis not present

## 2023-10-05 DIAGNOSIS — H401111 Primary open-angle glaucoma, right eye, mild stage: Secondary | ICD-10-CM | POA: Diagnosis not present

## 2023-10-13 ENCOUNTER — Other Ambulatory Visit: Payer: Self-pay | Admitting: Internal Medicine

## 2023-10-13 DIAGNOSIS — R0689 Other abnormalities of breathing: Secondary | ICD-10-CM

## 2023-10-13 DIAGNOSIS — R9389 Abnormal findings on diagnostic imaging of other specified body structures: Secondary | ICD-10-CM

## 2023-10-13 NOTE — Progress Notes (Signed)
Scan similar to last time we checked shows calcified pleural plaques suggesting prior asbestos exposure.  There is emphysema changes of the lungs and cholesterol plaque buildup in the aorta  Continue current medications  If agreeable, it may be worthwhile to do a baseline consult with pulmonology to discuss the lung findings to see if they have any other recommendations for future planning or future concerns given the asbestos findings  Let me know if agreeable to referral

## 2023-10-18 DIAGNOSIS — H268 Other specified cataract: Secondary | ICD-10-CM | POA: Diagnosis not present

## 2023-10-18 DIAGNOSIS — H25811 Combined forms of age-related cataract, right eye: Secondary | ICD-10-CM | POA: Diagnosis not present

## 2023-10-18 DIAGNOSIS — H2511 Age-related nuclear cataract, right eye: Secondary | ICD-10-CM | POA: Diagnosis not present

## 2023-11-04 ENCOUNTER — Ambulatory Visit (INDEPENDENT_AMBULATORY_CARE_PROVIDER_SITE_OTHER): Payer: PPO

## 2023-11-04 DIAGNOSIS — I442 Atrioventricular block, complete: Secondary | ICD-10-CM

## 2023-11-15 NOTE — Progress Notes (Signed)
Remote pacemaker transmission.   

## 2023-11-18 ENCOUNTER — Ambulatory Visit: Payer: PPO | Admitting: Podiatry

## 2023-11-22 ENCOUNTER — Encounter: Payer: Self-pay | Admitting: Podiatry

## 2023-11-22 ENCOUNTER — Ambulatory Visit (INDEPENDENT_AMBULATORY_CARE_PROVIDER_SITE_OTHER): Payer: PPO | Admitting: Podiatry

## 2023-11-22 VITALS — Ht 68.0 in | Wt 189.6 lb

## 2023-11-22 DIAGNOSIS — M79609 Pain in unspecified limb: Secondary | ICD-10-CM | POA: Diagnosis not present

## 2023-11-22 DIAGNOSIS — I739 Peripheral vascular disease, unspecified: Secondary | ICD-10-CM | POA: Diagnosis not present

## 2023-11-22 DIAGNOSIS — B351 Tinea unguium: Secondary | ICD-10-CM | POA: Diagnosis not present

## 2023-11-22 NOTE — Progress Notes (Signed)
This patient returns to my office for at risk foot care.  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  This patient is unable to cut nails himself since the patient cannot reach his nails.These nails are painful walking and wearing shoes.  This patient presents for at risk foot care today.  General Appearance  Alert, conversant and in no acute stress.  Vascular  Dorsalis pedis and posterior tibial  pulses are  weakly palpable  bilaterally.  Capillary return is within normal limits  bilaterally. Temperature is within normal limits  bilaterally.  Neurologic  Senn-Weinstein monofilament wire test within normal limits  bilaterally. Muscle power within normal limits bilaterally.  Nails Thick disfigured discolored nails with subungual debris  from hallux to fifth toes bilaterally. No evidence of bacterial infection or drainage bilaterally.  Orthopedic  No limitations of motion  feet .  No crepitus or effusions noted.  No bony pathology or digital deformities noted.  Skin  normotropic skin with no porokeratosis noted bilaterally.  No signs of infections or ulcers noted.     Onychomycosis  Pain in right toes  Pain in left toes  Consent was obtained for treatment procedures.   Mechanical debridement of nails 1-5  bilaterally performed with a nail nipper.  Filed with dremel without incident.    Return office visit    3 months                 Told patient to return for periodic foot care and evaluation due to potential at risk complications.   Helane Gunther DPM

## 2023-11-27 ENCOUNTER — Other Ambulatory Visit: Payer: Self-pay | Admitting: Medical

## 2023-12-08 ENCOUNTER — Ambulatory Visit: Payer: PPO | Attending: Cardiology | Admitting: Cardiology

## 2023-12-08 ENCOUNTER — Encounter: Payer: Self-pay | Admitting: Cardiology

## 2023-12-08 VITALS — BP 136/74 | HR 64 | Resp 16 | Ht 68.0 in | Wt 191.8 lb

## 2023-12-08 DIAGNOSIS — Z952 Presence of prosthetic heart valve: Secondary | ICD-10-CM

## 2023-12-08 DIAGNOSIS — E782 Mixed hyperlipidemia: Secondary | ICD-10-CM | POA: Diagnosis not present

## 2023-12-08 DIAGNOSIS — I70213 Atherosclerosis of native arteries of extremities with intermittent claudication, bilateral legs: Secondary | ICD-10-CM

## 2023-12-08 DIAGNOSIS — I442 Atrioventricular block, complete: Secondary | ICD-10-CM

## 2023-12-08 MED ORDER — EZETIMIBE 10 MG PO TABS: 10.0000 mg | ORAL_TABLET | Freq: Every day | ORAL | 3 refills | Status: AC

## 2023-12-08 NOTE — Patient Instructions (Signed)
Medication Instructions:  Your physician has recommended you make the following change in your medication: Start ezetimibe 10 mg by mouth daily  *If you need a refill on your cardiac medications before your next appointment, please call your pharmacy*   Lab Work: None--Dr Environmental health practitioner will check lab work If you have labs (blood work) drawn today and your tests are completely normal, you will receive your results only by: MyChart Message (if you have MyChart) OR A paper copy in the mail If you have any lab test that is abnormal or we need to change your treatment, we will call you to review the results.   Testing/Procedures: none   Follow-Up: At [facility], you and your health needs are our priority.  As part of our continuing mission to provide you with exceptional heart care, we have created designated Provider Care Teams.  These Care Teams include your primary Cardiologist (physician) and Advanced Practice Providers (APPs -  Physician Assistants and Nurse Practitioners) who all work together to provide you with the care you need, when you need it.  We recommend signing up for the patient portal called "MyChart".  Sign up information is provided on this After Visit Summary.  MyChart is used to connect with patients for Virtual Visits (Telemedicine).  Patients are able to view lab/test results, encounter notes, upcoming appointments, etc.  Non-urgent messages can be sent to your provider as well.   To learn more about what you can do with MyChart, go to [email or web address].    Your next appointment:   12 month(s)  Provider:   Yates Decamp, MD     Other Instructions

## 2023-12-08 NOTE — Progress Notes (Signed)
Cardiology Office Note:  .   Date:  12/08/2023  ID:  Jonathan Brown, DOB 1951-01-21, MRN [id number] PCP: Genia Del  [facility] Providers Cardiologist:  Yates Decamp, MD   History of Present Illness: .   Jonathan Brown is a 72 y.o. AA male patient status post aortic valve replacement using 21 mm pericardial Magna Ease valve for severe aortic stenosis 09/29/2017, Medtronic dual-chamber pacemaker for complete heart block status post aortic valve replacement 10/03/2017, hypertension, DM with chronic stage IIIa kidney disease, mixed hyperlipidemia, PAD. 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   Discussed the use of AI scribe software for clinical note transcription with the patient, who gave verbal consent to proceed.  History of Present Illness   Jonathan Brown, a 72 year old with a history of heart block, aortic valve replacement, primary hypertension, hypercholesterolemia, and diabetes mellitus, presents for a routine follow-up. He reports feeling well and has been stable over the past six months. He denies any chest pain or shortness of breath and has been able to maintain his usual activities without any limitations. He also denies any leg cramps or discomfort while walking. He has been managing his diabetes with the help of his primary care provider, Derinda Late.       Review of Systems  Cardiovascular:  Negative for chest pain, dyspnea on exertion and leg swelling.    Labs  + Lab Results  Component Value Date   CHOL 113 10/29/2021   HDL 29 (L) 10/29/2021   LDLCALC 45 10/29/2021   TRIG 247 (H) 10/29/2021   CHOLHDL 4.0 09/09/2020   Lab Results  Component Value Date   NA 142 08/23/2023   K 4.2 08/23/2023   CO2 17 (L) 08/23/2023   GLUCOSE 146 (H) 08/23/2023   BUN 14 08/23/2023   CREATININE 1.26 08/23/2023   CALCIUM 10.2 08/23/2023   EGFR 61 08/23/2023   GFRNONAA >60 09/28/2021      Latest Ref Rng & Units 08/23/2023    10:43 AM 02/03/2023    8:44 AM 07/05/2022    1:46 PM  BMP  Glucose 70 - 99 mg/dL 010  272  83   BUN 8 - 27 mg/dL 14  18  14    Creatinine 0.76 - 1.27 mg/dL 5.36  6.44  0.34   BUN/Creat Ratio 10 - 24 11  12  11    Sodium 134 - 144 mmol/L 142  141  142   Potassium 3.5 - 5.2 mmol/L 4.2  4.1  4.3   Chloride 96 - 106 mmol/L 110  104  106   CO2 20 - 29 mmol/L 17  22  20    Calcium 8.6 - 10.2 mg/dL 74.2  59.5  63.8       Latest Ref Rng & Units 02/03/2023    8:44 AM 06/03/2022   11:12 AM 01/28/2022    3:59 PM  CBC  WBC 3.4 - 10.8 x10E3/uL 6.0  6.0  5.7   Hemoglobin 13.0 - 17.7 g/dL 75.6  43.3  29.5   Hematocrit 37.5 - 51.0 % 45.9  40.5  37.1   Platelets 150 - 450 x10E3/uL 181  168  229    NMR lipid profile 02/03/2023:  Total cholesterol 190, triglycerides 146, HDL 42, LDL 122.  Physical Exam:   VS:  BP 136/74 (BP Location: Left Arm, Patient Position: Sitting, Cuff Size: Normal)   Pulse 64   Resp 16   Ht 5\' 8"  (1.727  m)   Wt 191 lb 12.8 oz (87 kg)   SpO2 96%   BMI 29.16 kg/m    Wt Readings from Last 3 Encounters:  12/08/23 191 lb 12.8 oz (87 kg)  11/22/23 189 lb 9.6 oz (86 kg)  08/23/23 189 lb 9.6 oz (86 kg)     Physical Exam Neck:     Vascular: Carotid bruit (bilateral) present. No JVD.  Cardiovascular:     Rate and Rhythm: Normal rate and regular rhythm.     Pulses: Intact distal pulses.          Dorsalis pedis pulses are 0 on the right side and 0 on the left side.       Posterior tibial pulses are 2+ on the right side and 2+ on the left side.     Heart sounds: Murmur heard.     Harsh midsystolic murmur is present with a grade of 2/6 at the upper right sternal border radiating to the neck.     No gallop.  Pulmonary:     Effort: Pulmonary effort is normal.     Breath sounds: Normal breath sounds.  Abdominal:     General: Bowel sounds are normal.     Palpations: Abdomen is soft.  Musculoskeletal:     Right lower leg: No edema.     Left lower leg: No edema.     Studies  Reviewed: Marland Kitchen    ECHOCARDIOGRAM COMPLETE 11/24/2022  Normal LV systolic function with visual EF 55-60%. Left ventricle cavity is normal in size. Mild concentric hypertrophy of the left ventricle. Normal global wall motion. Doppler evidence of grade II (pseudonormal) diastolic dysfunction, elevated LAP. Calculated EF 62%. S/p 21-mm pericardial Magna Ease valve replacement (2018). Trace aortic regurgitation. AVA (VTI) measures 1.5 cm^2. AV Mean Grad measures 21.5 mmHg. AV Pk Vel measures 3.45 m/s. Trace mitral regurgitation. Mild mitral valve leaflet thickening with mild calcification. Mildly restricted mitral valve leaflets. Structurally normal tricuspid valve.  Mild tricuspid regurgitation. Mild pulmonary hypertension. RVSP measures 34 mmHg. The aortic root is normal. Mildly dilated ascending aorta at 4 cm. Compared to 05/2020, diastolic dysfunction has progressed. AV mean PG has increased from 15 mm Hg.   Lower Extremity Arterial Duplex 11/24/2022: No hemodynamically significant stenosis are identified in the right lower extremity arterial system.  Mild velocity increase at the left distal superficial femoral artery of >50% with PSV/EDV ratio of 2.0 only.  This exam reveals mildly decreased perfusion of the right lower extremity, noted at the dorsalis pedis and post tibial artery level (ABI 0.96) with mildly abnormal biphasic waveform pattern at the ankle. This exam reveals normal perfusion of the left lower extremity (ABI 1.01) with mildly abnormal biphasic waveform pattern at the left ankle. Compared to the study done on 07/06/2017, no significant change in the left SFA stenosis, ABI has improved from 0.76 on the right and 0.76 on the left.  EKG:    EKG Interpretation Date/Time:  Thursday December 08 2023 10:39:41 EST Ventricular Rate:  67 PR Interval:  184 QRS Duration:  170 QT Interval:  440 QTC Calculation: 464 R Axis:   -17  Text Interpretation: EKG 12/08/2023: Atrially sensed and  ventricularly paced rhythm at rate of 67 bpm.  No further analysis.  No significant change from 05/28/2022. Confirmed by Delrae Rend [phone]) on 12/08/2023 11:00:42 AM    EKG 05/28/2022: Normal sinus rhythm at rate of 69 bpm, ventricularly paced rhythm. No further analysis.   Medications and allergies    No  Known Allergies   Current Outpatient Medications:    acetaminophen (TYLENOL) 500 MG tablet, Take 1,000 mg by mouth in the morning and at bedtime., Disp: , Rfl:    amLODipine (NORVASC) 10 MG tablet, TAKE 1 TABLET BY MOUTH EVERY DAY, Disp: 90 tablet, Rfl: 3   aspirin EC 81 MG tablet, Take 81 mg by mouth daily., Disp: , Rfl:    Blood Glucose Monitoring Suppl DEVI, Test 1-2 times day. Needs onetouch meter, Disp: 1 each, Rfl: 0   Cholecalciferol 10 MCG (400 UNIT) CAPS, Take 1 capsule by mouth daily., Disp: , Rfl:    empagliflozin (JARDIANCE) 10 MG TABS tablet, Take 1 tablet (10 mg total) by mouth daily., Disp: 90 tablet, Rfl: 2   ezetimibe (ZETIA) 10 MG tablet, Take 1 tablet (10 mg total) by mouth daily., Disp: 90 tablet, Rfl: 3   fenofibrate (TRICOR) 48 MG tablet, Take 1 tablet (48 mg total) by mouth daily., Disp: 90 tablet, Rfl: 1   ferrous gluconate (FERGON) 324 MG tablet, Take 1 tablet (324 mg total) by mouth daily with breakfast., Disp: 90 tablet, Rfl: 2   Glucose Blood (BLOOD GLUCOSE TEST STRIPS) STRP, Test 1-2 times daily. Uses onetouch meter, Disp: 100 strip, Rfl: 1   Lancet Device MISC, Use device to put lancet in, Disp: 1 each, Rfl: 0   Lancets Misc. MISC, 1-2 times a day. Needs onetouch lancets, Disp: 100 each, Rfl: 1   metFORMIN (GLUCOPHAGE) 500 MG tablet, 1 tablet in morning, 1/2 tablet in evening, Disp: 180 tablet, Rfl: 2   metoprolol tartrate (LOPRESSOR) 50 MG tablet, Take 1 tablet (50 mg total) by mouth 2 (two) times daily., Disp: 180 tablet, Rfl: 1   Multiple Vitamins-Minerals (CENTRUM SILVER 50+MEN PO), Take 1 tablet by mouth daily., Disp: , Rfl:    omeprazole (PRILOSEC) 40  MG capsule, Take 1 capsule (40 mg total) by mouth daily., Disp: 90 capsule, Rfl: 1   rosuvastatin (CRESTOR) 40 MG tablet, Take 1 tablet (40 mg total) by mouth daily., Disp: 90 tablet, Rfl: 2   valsartan (DIOVAN) 160 MG tablet, TAKE 1 TABLET BY MOUTH EVERY DAY, Disp: 90 tablet, Rfl: 0   vitamin B-12 (CYANOCOBALAMIN) 50 MCG tablet, Take 50 mcg by mouth daily., Disp: , Rfl:    ASSESSMENT AND PLAN: .      ICD-10-CM   1. CHB (complete heart block) (HCC)  I44.2 EKG 12-Lead    2. S/P AVR with 21 mm Magna Ease Pericardial Tissue Valve,  09/18/2017: Karsten Ro, MD  Z95.2     3. Atherosclerosis of native artery of both lower extremities with intermittent claudication (HCC)  I70.213     4. Mixed hyperlipidemia  E78.2 ezetimibe (ZETIA) 10 MG tablet      CHB (complete heart block) (HCC) Patient has dual-chamber pacemaker, being followed in our pacemaker clinic.  EKG reveals ventricularly paced rhythm.  Assessment and Plan    Bioprosthetic Aortic Valve Replacement Stable with slight narrowing compared to last year. No symptoms of shortness of breath or chest pain. -No need for repeat echocardiogram unless symptoms develop.  Hypercholesterolemia LDL elevated despite current medication. -Add Ezetimibe (Zetia) 10mg  daily to current regimen. -Repeat cholesterol panel at next diabetes follow-up appointment with his PCP.  Peripheral Artery Disease (Claudication) No current symptoms, good pulses in extremities. -Continue current management.  Hypertension Well controlled on current regimen of Amlodipine, Valsartan, and Metoprolol. -Continue current medications.  Diabetes Mellitus Not discussed in detail during this visit. -Continue current management and follow-up with  Derinda Late.  Follow-up in 1 year unless symptoms develop.      Signed,  Yates Decamp, MD, [facility] 12/08/2023, 11:17 AM [facility] [address] [place], [place] [address] Phone: [phone]. Fax:   [phone]

## 2023-12-12 ENCOUNTER — Ambulatory Visit (INDEPENDENT_AMBULATORY_CARE_PROVIDER_SITE_OTHER): Payer: PPO | Admitting: Medical

## 2023-12-12 VITALS — BP 120/62 | HR 83 | Wt 190.6 lb

## 2023-12-12 DIAGNOSIS — I10 Essential (primary) hypertension: Secondary | ICD-10-CM

## 2023-12-12 DIAGNOSIS — E1141 Type 2 diabetes mellitus with diabetic mononeuropathy: Secondary | ICD-10-CM

## 2023-12-12 DIAGNOSIS — Z8639 Personal history of other endocrine, nutritional and metabolic disease: Secondary | ICD-10-CM | POA: Diagnosis not present

## 2023-12-12 DIAGNOSIS — E1169 Type 2 diabetes mellitus with other specified complication: Secondary | ICD-10-CM | POA: Diagnosis not present

## 2023-12-12 DIAGNOSIS — E785 Hyperlipidemia, unspecified: Secondary | ICD-10-CM | POA: Diagnosis not present

## 2023-12-12 DIAGNOSIS — E118 Type 2 diabetes mellitus with unspecified complications: Secondary | ICD-10-CM | POA: Diagnosis not present

## 2023-12-12 DIAGNOSIS — I739 Peripheral vascular disease, unspecified: Secondary | ICD-10-CM | POA: Diagnosis not present

## 2023-12-12 DIAGNOSIS — I70213 Atherosclerosis of native arteries of extremities with intermittent claudication, bilateral legs: Secondary | ICD-10-CM

## 2023-12-12 DIAGNOSIS — Z952 Presence of prosthetic heart valve: Secondary | ICD-10-CM

## 2023-12-12 LAB — LIPID PANEL

## 2023-12-12 NOTE — Progress Notes (Signed)
Subjective:  Jonathan Brown is a 72 y.o. male who presents for Chief Complaint  Patient presents with   Medical Management of Chronic Issues    Check cholesterol and blood sugar. Started on Ezetimibe 10mg  for the 1st time Friday and got dizziness with it so didn't take it anymore.      Here for med check.    Doesn't drink a lot of water.  Drinks a fair amount of soda.     Diabetes-compliant with medications, metformin 500 mg 1 tablet morning, 1/2 tablet at night, Jardiance 10 mg daily  Hyperlipidemia-currently on Crestor and fenofibrate but cardiology added Zetia last week.  He took of 1 day and felt dizzy and did not feel good.  So he did not take any more of this..  There is symptoms resolved  Hypertension-compliant with metoprolol, amlodipine and valsartan  History of iron deficiency-he continues on iron twice daily  He has no other issues  No other aggravating or relieving factors.    No other c/o.  Past Medical History:  Diagnosis Date   Anemia    Arthritis    Cataract 02/26/2021   Bilateral   CHB (complete heart block) (HCC) 10/05/2017   Diabetes mellitus without complication (HCC) 2014   type 2    Encounter for care of pacemaker 07/20/2019   Former smoker    GERD (gastroesophageal reflux disease)    History of colon polyps    Hyperlipidemia    Hypertension 2012   Open-angle glaucoma 02/26/2021   Pacemaker Medtronic Azure XT DR MRI [id number] Dual chamber pacemaker in situ 10/03/2017   PVD (peripheral vascular disease) (HCC)    S/P AVR    Aortic valve replacement using a 21-mm pericardial Magna Ease (09/29/2017 by Dr. 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     amLODipine (NORVASC) 10 MG tablet TAKE 1 TABLET BY MOUTH EVERY DAY 90 tablet 3   aspirin EC 81 MG tablet Take 81 mg by mouth daily.     empagliflozin (JARDIANCE) 10 MG TABS tablet Take 1 tablet (10 mg total) by mouth daily. 90 tablet 2   fenofibrate (TRICOR) 48 MG tablet Take 1 tablet (48 mg total) by mouth daily. 90 tablet 1   ferrous gluconate (FERGON) 324 MG tablet Take 1 tablet (324 mg total) by mouth daily with breakfast. (Patient taking differently: Take 324 mg by mouth 2 (two) times daily with a meal.) 90 tablet 2   metFORMIN (GLUCOPHAGE) 500 MG tablet 1 tablet in morning, 1/2 tablet in evening 180 tablet 2   metoprolol tartrate (LOPRESSOR) 50 MG tablet Take 1 tablet (50 mg total) by mouth 2 (two) times daily. 180 tablet 1   Multiple Vitamins-Minerals (CENTRUM SILVER 50+MEN PO) Take 1 tablet by mouth daily.     omeprazole (PRILOSEC) 40 MG capsule Take 1 capsule (40 mg total) by mouth daily. 90 capsule 1   rosuvastatin (CRESTOR) 40 MG tablet Take 1 tablet (40 mg total) by mouth daily. 90 tablet 2   valsartan (DIOVAN) 160 MG tablet TAKE 1 TABLET BY MOUTH EVERY DAY 90 tablet 0   Blood Glucose Monitoring  Suppl DEVI Test 1-2 times day. Needs onetouch meter 1 each 0   ezetimibe (ZETIA) 10 MG tablet Take 1 tablet (10 mg total) by mouth daily. (Patient not taking: Reported on 12/12/2023) 90 tablet 3   Glucose Blood (BLOOD GLUCOSE TEST STRIPS) STRP Test 1-2 times daily. Uses onetouch meter 100 strip 1   Lancet Device MISC Use device to put lancet in 1 each 0   Lancets Misc. MISC 1-2 times a day. Needs onetouch lancets 100 each 1   vitamin B-12 (CYANOCOBALAMIN) 50 MCG tablet Take 50 mcg by mouth daily. (Patient not taking: Reported on 12/12/2023)     No current facility-administered medications on file prior to visit.    The following portions of the patient's history were reviewed and updated as appropriate: allergies, current medications, past family history, past medical  history, past social history, past surgical history and problem list.  ROS Otherwise as in subjective above   Objective: BP 120/62   Pulse 83   Wt 190 lb 9.6 oz (86.5 kg)   BMI 28.98 kg/m   Wt Readings from Last 3 Encounters:  12/12/23 190 lb 9.6 oz (86.5 kg)  12/08/23 191 lb 12.8 oz (87 kg)  11/22/23 189 lb 9.6 oz (86 kg)   BP Readings from Last 3 Encounters:  12/12/23 120/62  12/08/23 136/74  08/23/23 138/78    General appearance: alert, no distress, well developed, well nourished Neck: supple, no lymphadenopathy, no thyromegaly, no masses Heart: 2 out of 3 holosystolic murmur heard in upper sternal borders, otherwise normal S1 and S2 regular rate and rhythm Lungs: CTA bilaterally, no wheezes, rhonchi, or rales Pulses: 2+ radial pulses, 1+ pedal pulses, normal cap refill Ext: no edema   Assessment: Encounter Diagnoses  Name Primary?   Type 2 diabetes mellitus with complication, without long-term current use of insulin (HCC) Yes   Hyperlipidemia associated with type 2 diabetes mellitus (HCC)    Essential hypertension    PVD (peripheral vascular disease) (HCC)    S/P AVR    Atherosclerosis of native artery of both lower extremities with intermittent claudication (HCC)    Diabetic mononeuropathy associated with type 2 diabetes mellitus (HCC)    History of iron deficiency      Plan: Diabetes Updated labs today Continue metformin 500 mg 1 tab the morning, one half tab afternoon, continue Jardiance 10 mg daily Unfortunately he has been drinking a lot of soda.  Advised against sugary drinks.  Try to eat healthy fruits vegetables and lean meat.  increase water intake.  Dyslipidemia I reviewed his recent cardiology visit He currently is rosuvastatin Crestor 40 mg daily, fenofibrate 48 mg daily and they added Zetia.  However after he took the Zetia for 1 day he had side effects and symptoms that concern him so he stopped this medication after 1 day Updated labs  today Continue aspirin 81 mg daily  History of iron deficiency currently on iron twice daily.  Updated labs today  Hypertension Continue amlodipine 10 mg daily, metoprolol 50 mg twice daily, valsartan 160 mg daily      Kerrion was seen today for medical management of chronic issues.  Diagnoses and all orders for this visit:  Type 2 diabetes mellitus with complication, without long-term current use of insulin (HCC) -     Hemoglobin A1c -     Comprehensive metabolic panel -     Microalbumin/Creatinine Ratio, Urine  Hyperlipidemia associated with type 2 diabetes mellitus (HCC) -     Lipid  panel -     Comprehensive metabolic panel  Essential hypertension  PVD (peripheral vascular disease) (HCC)  S/P AVR  Atherosclerosis of native artery of both lower extremities with intermittent claudication (HCC)  Diabetic mononeuropathy associated with type 2 diabetes mellitus (HCC)  History of iron deficiency -     Iron, TIBC and Ferritin Panel -     CBC    Follow up: pending labs

## 2023-12-13 LAB — CBC
Hematocrit: 43.3 % (ref 37.5–51.0)
Hemoglobin: 14.7 g/dL (ref 13.0–17.7)
MCH: 31.3 pg (ref 26.6–33.0)
MCHC: 33.9 g/dL (ref 31.5–35.7)
MCV: 92 fL (ref 79–97)
Platelets: 156 10*3/uL (ref 150–450)
RBC: 4.7 x10E6/uL (ref 4.14–5.80)
RDW: 11.2 % — ABNORMAL LOW (ref 11.6–15.4)
WBC: 5.1 10*3/uL (ref 3.4–10.8)

## 2023-12-13 LAB — COMPREHENSIVE METABOLIC PANEL
ALT: 29 IU/L (ref 0–44)
AST: 25 IU/L (ref 0–40)
Albumin: 4.2 g/dL (ref 3.8–4.8)
Alkaline Phosphatase: 95 IU/L (ref 44–121)
BUN/Creatinine Ratio: 16 (ref 10–24)
BUN: 16 mg/dL (ref 8–27)
Bilirubin Total: 0.4 mg/dL (ref 0.0–1.2)
CO2: 19 mmol/L — ABNORMAL LOW (ref 20–29)
Calcium: 10.2 mg/dL (ref 8.6–10.2)
Chloride: 110 mmol/L — ABNORMAL HIGH (ref 96–106)
Creatinine, Ser: 1.02 mg/dL (ref 0.76–1.27)
Globulin, Total: 2.7 g/dL (ref 1.5–4.5)
Glucose: 136 mg/dL — ABNORMAL HIGH (ref 70–99)
Potassium: 4 mmol/L (ref 3.5–5.2)
Sodium: 143 mmol/L (ref 134–144)
Total Protein: 6.9 g/dL (ref 6.0–8.5)
eGFR: 78 mL/min/{1.73_m2} (ref >59)

## 2023-12-13 LAB — IRON,TIBC AND FERRITIN PANEL
Ferritin: 243 ng/mL (ref 30–400)
Iron Saturation: 27 % (ref 15–55)
Iron: 91 ug/dL (ref 38–169)
Total Iron Binding Capacity: 340 ug/dL (ref 250–450)
UIBC: 249 ug/dL (ref 111–343)

## 2023-12-13 LAB — LIPID PANEL
Cholesterol, Total: 165 mg/dL (ref 100–199)
HDL: 41 mg/dL (ref >39)
LDL CALC COMMENT:: 4 ratio (ref 0.0–5.0)
LDL Chol Calc (NIH): 103 mg/dL — ABNORMAL HIGH (ref 0–99)
Triglycerides: 116 mg/dL (ref 0–149)
VLDL Cholesterol Cal: 21 mg/dL (ref 5–40)

## 2023-12-13 LAB — MICROALBUMIN / CREATININE URINE RATIO
Creatinine, Urine: 80.6 mg/dL
Microalb/Creat Ratio: <4 mg/g{creat} (ref 0–29)
Microalbumin, Urine: <3 ug/mL

## 2023-12-13 LAB — HEMOGLOBIN A1C
Est. average glucose Bld gHb Est-mCnc: 123 mg/dL
Hgb A1c MFr Bld: 5.9 % — ABNORMAL HIGH (ref 4.8–5.6)

## 2023-12-13 NOTE — Progress Notes (Signed)
Microalbumin kidney marker okay

## 2023-12-13 NOTE — Progress Notes (Signed)
Diabetes marker stable at 5.9%.  Cholesterol needs to look better.  The LDL is 103 and should be less than 50.  Blood counts okay.  Rest of labs are okay  I would recommend trying the Zetia again the doing 1/2 tablet daily for a week and see if you tolerate this.  This is the medicine that you tried once and got a little bit of side effects from.  If tolerating 1/2 tablet a day for a week then we could try going up on the dose  If you do not want to try this again denied recommend continuing rosuvastatin Crestor but adding a medicine such as Repatha once every 2 weeks injection to get the LDL to goal.    We also had discussed the fenofibrate that you are taking.  This is for triglycerides and HDL.  I do believe this is helping so I would continue this medication  Continue rest of medicines as usual

## 2024-01-09 ENCOUNTER — Other Ambulatory Visit: Payer: Self-pay | Admitting: Medical

## 2024-01-09 DIAGNOSIS — E782 Mixed hyperlipidemia: Secondary | ICD-10-CM

## 2024-02-03 ENCOUNTER — Ambulatory Visit: Payer: PPO | Attending: Internal Medicine

## 2024-02-03 DIAGNOSIS — I442 Atrioventricular block, complete: Secondary | ICD-10-CM | POA: Diagnosis not present

## 2024-02-04 LAB — CUP PACEART REMOTE DEVICE CHECK
Battery Remaining Longevity: 41 mo
Battery Voltage: 2.94 V
Brady Statistic AP VP Percent: 19.02 %
Brady Statistic AP VS Percent: 0 %
Brady Statistic AS VP Percent: 80.97 %
Brady Statistic AS VS Percent: 0.01 %
Brady Statistic RA Percent Paced: 19 %
Brady Statistic RV Percent Paced: 99.99 %
Date Time Interrogation Session: 20250207014416
Implantable Lead Connection Status: 753985
Implantable Lead Connection Status: 753985
Implantable Lead Implant Date: 20181008
Implantable Lead Implant Date: 20181008
Implantable Lead Location: 753859
Implantable Lead Location: 753860
Implantable Lead Model: 3830
Implantable Lead Model: 5076
Implantable Pulse Generator Implant Date: 20181008
Lead Channel Impedance Value: 323 Ohm
Lead Channel Impedance Value: 342 Ohm
Lead Channel Impedance Value: 456 Ohm
Lead Channel Impedance Value: 475 Ohm
Lead Channel Pacing Threshold Amplitude: 0.875 V
Lead Channel Pacing Threshold Amplitude: 1 V
Lead Channel Pacing Threshold Pulse Width: 0.4 ms
Lead Channel Pacing Threshold Pulse Width: 0.4 ms
Lead Channel Sensing Intrinsic Amplitude: 3.875 mV
Lead Channel Sensing Intrinsic Amplitude: 3.875 mV
Lead Channel Sensing Intrinsic Amplitude: 4.375 mV
Lead Channel Sensing Intrinsic Amplitude: 4.375 mV
Lead Channel Setting Pacing Amplitude: 2 V
Lead Channel Setting Pacing Amplitude: 2 V
Lead Channel Setting Pacing Pulse Width: 0.4 ms
Lead Channel Setting Sensing Sensitivity: 2 mV
Zone Setting Status: 755011
Zone Setting Status: 755011

## 2024-02-21 DIAGNOSIS — H401123 Primary open-angle glaucoma, left eye, severe stage: Secondary | ICD-10-CM | POA: Diagnosis not present

## 2024-02-21 DIAGNOSIS — H401111 Primary open-angle glaucoma, right eye, mild stage: Secondary | ICD-10-CM | POA: Diagnosis not present

## 2024-02-22 ENCOUNTER — Other Ambulatory Visit: Payer: Self-pay | Admitting: Medical

## 2024-02-22 ENCOUNTER — Ambulatory Visit: Payer: PPO | Admitting: Podiatry

## 2024-02-22 ENCOUNTER — Encounter: Payer: Self-pay | Admitting: Podiatry

## 2024-02-22 DIAGNOSIS — E1141 Type 2 diabetes mellitus with diabetic mononeuropathy: Secondary | ICD-10-CM | POA: Diagnosis not present

## 2024-02-22 DIAGNOSIS — I739 Peripheral vascular disease, unspecified: Secondary | ICD-10-CM

## 2024-02-22 DIAGNOSIS — M79609 Pain in unspecified limb: Secondary | ICD-10-CM | POA: Diagnosis not present

## 2024-02-22 DIAGNOSIS — B351 Tinea unguium: Secondary | ICD-10-CM | POA: Diagnosis not present

## 2024-02-22 NOTE — Progress Notes (Signed)
 This patient returns to my office for at risk foot care.  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  This patient is unable to cut nails himself since the patient cannot reach his nails.These nails are painful walking and wearing shoes.  This patient presents for at risk foot care today.  General Appearance  Alert, conversant and in no acute stress.  Vascular  Dorsalis pedis and posterior tibial  pulses are  weakly palpable  bilaterally.  Capillary return is within normal limits  bilaterally. Temperature is within normal limits  bilaterally.  Neurologic  Senn-Weinstein monofilament wire test within normal limits  bilaterally. Muscle power within normal limits bilaterally.  Nails Thick disfigured discolored nails with subungual debris  from hallux to fifth toes bilaterally. No evidence of bacterial infection or drainage bilaterally.  Orthopedic  No limitations of motion  feet .  No crepitus or effusions noted.  No bony pathology or digital deformities noted.  Skin  normotropic skin with no porokeratosis noted bilaterally.  No signs of infections or ulcers noted.     Onychomycosis  Pain in right toes  Pain in left toes  Consent was obtained for treatment procedures.   Mechanical debridement of nails 1-5  bilaterally performed with a nail nipper.  Filed with dremel without incident.    Return office visit    3 months                 Told patient to return for periodic foot care and evaluation due to potential at risk complications.   Helane Gunther DPM

## 2024-03-04 ENCOUNTER — Other Ambulatory Visit: Payer: Self-pay | Admitting: Medical

## 2024-03-06 DIAGNOSIS — H401111 Primary open-angle glaucoma, right eye, mild stage: Secondary | ICD-10-CM | POA: Diagnosis not present

## 2024-03-06 DIAGNOSIS — H401123 Primary open-angle glaucoma, left eye, severe stage: Secondary | ICD-10-CM | POA: Diagnosis not present

## 2024-03-06 NOTE — Addendum Note (Signed)
 Addended by: Elease Etienne A on: 03/06/2024 11:33 AM   Modules accepted: Orders

## 2024-03-06 NOTE — Progress Notes (Signed)
 Remote pacemaker transmission.

## 2024-03-20 DIAGNOSIS — H401123 Primary open-angle glaucoma, left eye, severe stage: Secondary | ICD-10-CM | POA: Diagnosis not present

## 2024-03-20 DIAGNOSIS — H401111 Primary open-angle glaucoma, right eye, mild stage: Secondary | ICD-10-CM | POA: Diagnosis not present

## 2024-03-28 ENCOUNTER — Other Ambulatory Visit: Payer: Self-pay | Admitting: Medical

## 2024-03-28 ENCOUNTER — Telehealth: Payer: Self-pay | Admitting: Medical

## 2024-03-28 MED ORDER — FERROUS GLUCONATE 324 (38 FE) MG PO TABS: 324.0000 mg | ORAL_TABLET | Freq: Two times a day (BID) | ORAL | 0 refills | Status: DC

## 2024-03-28 NOTE — Telephone Encounter (Signed)
 Pt requesting a refill on Ferrous to [id number] - [place], [place] - [address]

## 2024-05-04 ENCOUNTER — Ambulatory Visit (INDEPENDENT_AMBULATORY_CARE_PROVIDER_SITE_OTHER): Payer: PPO

## 2024-05-04 DIAGNOSIS — I442 Atrioventricular block, complete: Secondary | ICD-10-CM | POA: Diagnosis not present

## 2024-05-04 LAB — CUP PACEART REMOTE DEVICE CHECK
Battery Remaining Longevity: 38 mo
Battery Voltage: 2.93 V
Brady Statistic AP VP Percent: 23.25 %
Brady Statistic AP VS Percent: 0 %
Brady Statistic AS VP Percent: 76.73 %
Brady Statistic AS VS Percent: 0.02 %
Brady Statistic RA Percent Paced: 23.24 %
Brady Statistic RV Percent Paced: 99.98 %
Date Time Interrogation Session: 20250509024317
Implantable Lead Connection Status: 753985
Implantable Lead Connection Status: 753985
Implantable Lead Implant Date: 20181008
Implantable Lead Implant Date: 20181008
Implantable Lead Location: 753859
Implantable Lead Location: 753860
Implantable Lead Model: 3830
Implantable Lead Model: 5076
Implantable Pulse Generator Implant Date: 20181008
Lead Channel Impedance Value: 304 Ohm
Lead Channel Impedance Value: 323 Ohm
Lead Channel Impedance Value: 437 Ohm
Lead Channel Impedance Value: 437 Ohm
Lead Channel Pacing Threshold Amplitude: 0.875 V
Lead Channel Pacing Threshold Amplitude: 1 V
Lead Channel Pacing Threshold Pulse Width: 0.4 ms
Lead Channel Pacing Threshold Pulse Width: 0.4 ms
Lead Channel Sensing Intrinsic Amplitude: 2.875 mV
Lead Channel Sensing Intrinsic Amplitude: 2.875 mV
Lead Channel Sensing Intrinsic Amplitude: 4.375 mV
Lead Channel Sensing Intrinsic Amplitude: 4.375 mV
Lead Channel Setting Pacing Amplitude: 2 V
Lead Channel Setting Pacing Amplitude: 2 V
Lead Channel Setting Pacing Pulse Width: 0.4 ms
Lead Channel Setting Sensing Sensitivity: 2 mV
Zone Setting Status: 755011
Zone Setting Status: 755011

## 2024-05-08 ENCOUNTER — Telehealth: Payer: Self-pay | Admitting: Medical

## 2024-05-08 NOTE — Telephone Encounter (Signed)
 Called pt regarding needs appt for [facility] Metrics Gaps  Left message need diabetes check or CPE asap

## 2024-05-22 NOTE — Telephone Encounter (Signed)
 CPE scheduled for tomorrow

## 2024-05-23 ENCOUNTER — Encounter: Payer: Self-pay | Admitting: Medical

## 2024-05-23 ENCOUNTER — Ambulatory Visit (INDEPENDENT_AMBULATORY_CARE_PROVIDER_SITE_OTHER): Admitting: Medical

## 2024-05-23 VITALS — BP 124/70 | HR 66 | Ht 67.5 in | Wt 190.4 lb

## 2024-05-23 DIAGNOSIS — Z7185 Encounter for immunization safety counseling: Secondary | ICD-10-CM

## 2024-05-23 DIAGNOSIS — I700 Atherosclerosis of aorta: Secondary | ICD-10-CM

## 2024-05-23 DIAGNOSIS — Z23 Encounter for immunization: Secondary | ICD-10-CM

## 2024-05-23 DIAGNOSIS — Z125 Encounter for screening for malignant neoplasm of prostate: Secondary | ICD-10-CM | POA: Diagnosis not present

## 2024-05-23 DIAGNOSIS — E785 Hyperlipidemia, unspecified: Secondary | ICD-10-CM | POA: Diagnosis not present

## 2024-05-23 DIAGNOSIS — D649 Anemia, unspecified: Secondary | ICD-10-CM

## 2024-05-23 DIAGNOSIS — Z87891 Personal history of nicotine dependence: Secondary | ICD-10-CM

## 2024-05-23 DIAGNOSIS — R9389 Abnormal findings on diagnostic imaging of other specified body structures: Secondary | ICD-10-CM | POA: Diagnosis not present

## 2024-05-23 DIAGNOSIS — I10 Essential (primary) hypertension: Secondary | ICD-10-CM

## 2024-05-23 DIAGNOSIS — I70213 Atherosclerosis of native arteries of extremities with intermittent claudication, bilateral legs: Secondary | ICD-10-CM | POA: Diagnosis not present

## 2024-05-23 DIAGNOSIS — Z0000 Encounter for general adult medical examination without abnormal findings: Secondary | ICD-10-CM | POA: Insufficient documentation

## 2024-05-23 DIAGNOSIS — I739 Peripheral vascular disease, unspecified: Secondary | ICD-10-CM

## 2024-05-23 DIAGNOSIS — E118 Type 2 diabetes mellitus with unspecified complications: Secondary | ICD-10-CM

## 2024-05-23 DIAGNOSIS — Z950 Presence of cardiac pacemaker: Secondary | ICD-10-CM

## 2024-05-23 DIAGNOSIS — E1169 Type 2 diabetes mellitus with other specified complication: Secondary | ICD-10-CM | POA: Diagnosis not present

## 2024-05-23 DIAGNOSIS — Z7189 Other specified counseling: Secondary | ICD-10-CM

## 2024-05-23 LAB — MICROSCOPIC EXAMINATION

## 2024-05-23 NOTE — Progress Notes (Signed)
 Requested from Dr. [place] Frazier.   Patient told me [facility] on [address] as I showed him a picture of the provider. Requested from him as well just incase he saw him too.

## 2024-05-23 NOTE — Progress Notes (Signed)
Subjective:    Jonathan Brown is a 73 y.o. male who presents for Preventative Services visit and chronic medical problems/med check visit.    Chief Complaint  Patient presents with   Annual Exam    Fasting cpe, drank glass full of soda, no concerns    Primary Care Provider Simran Mannis, Christiane Cowing, PA-C here for primary care  Patient Care Team: Jaree Dwight, Newt Barefoot as PCP - General (Family Medicine) Dr. [place] Frazier, ophthalmology Ruffin Cotton, DPM as Consulting Physician (Podiatry) Knox Perl, MD as Consulting Physician (Cardiology) Elois Hair, MD as Consulting Physician (Gastroenterology) Sees dentist Sees eye doctor  Drank soda an hour ago, otherwise fasting.   Medical Services you may have received from other than [facility] providers in the past year (date may be approximate) none  Exercise Current exercise habits: mowing and yard work, manages yard work, walking   Nutrition/Diet Current diet: fruits, vegetables, some junk as well  Depression Screen    05/23/2024    2:34 PM  Depression screen PHQ 2/9  Decreased Interest 0  Down, Depressed, Hopeless 0  PHQ - 2 Score 0    Activities of Daily Living Screen/Functional Status Survey Is the patient deaf or have difficulty hearing?: No Does the patient have difficulty seeing, even when wearing glasses/contacts?: No Does the patient have difficulty concentrating, remembering, or making decisions?: No Does the patient have difficulty walking or climbing stairs?: No Does the patient have difficulty dressing or bathing?: No Does the patient have difficulty doing errands alone such as visiting a doctor's office or shopping?: No  Can patient draw a clock face showing 3:15 oclock, no, illiterate   Fall Risk Screen    05/23/2024    2:26 PM 02/03/2023    8:19 AM 12/03/2022    9:06 AM 06/03/2022   10:27 AM 01/27/2022   10:17 AM  Fall Risk   Falls in the past year? 0 0 0 0 0  Number falls in past yr: 0 0 0 0 0  Injury  with Fall? 0 0 0 0 0  Risk for fall due to : No Fall Risks No Fall Risks Medication side effect No Fall Risks No Fall Risks  Follow up Falls evaluation completed Falls evaluation completed Falls prevention discussed;Education provided;Falls evaluation completed Falls evaluation completed Falls evaluation completed    Gait Assessment: Normal gait observed yes  Advanced directives Does patient have a Health Care Power of Attorney? No Does patient have a Living Will? No  Past Medical History:  Diagnosis Date   Anemia    Arthritis    Cataract 02/26/2021   Bilateral   CHB (complete heart block) (HCC) 10/05/2017   Diabetes mellitus without complication (HCC) 2014   type 2    Encounter for care of pacemaker 07/20/2019   Former smoker    GERD (gastroesophageal reflux disease)    History of colon polyps    Hyperlipidemia    Hypertension 2012   Open-angle glaucoma 02/26/2021   Pacemaker Medtronic Azure XT DR MRI [id number] Dual chamber pacemaker in situ 10/03/2017   PVD (peripheral vascular disease) (HCC)    S/P AVR    Aortic valve replacement using a 21-mm pericardial Magna Ease (09/29/2017 by Dr. 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    wife is able to read to him    Past Surgical History:  Procedure Laterality Date  AORTIC VALVE REPLACEMENT N/A 09/29/2017   Procedure: AORTIC VALVE REPLACEMENT (AVR) using 21mm Magna Ease valve;  Surgeon: Heriberto London, MD;  Location: [facility] OR;  Service: Open Heart Surgery;  Laterality: N/A;   CARDIAC CATHETERIZATION  07/12/2017   COLONOSCOPY  2008   LEFT AND RIGHT HEART CATHETERIZATION WITH CORONARY ANGIOGRAM N/A 04/02/2014   Procedure: LEFT AND RIGHT HEART CATHETERIZATION WITH CORONARY ANGIOGRAM;  Surgeon: Jessica Morn, MD;  Location: [facility] CATH LAB;  Service: Cardiovascular;  Laterality: N/A;   PACEMAKER IMPLANT N/A 10/03/2017   Procedure:  PACEMAKER IMPLANT;  Surgeon: Tammie Fall, MD;  Location: [facility] INVASIVE CV LAB;  Service: Cardiovascular;  Laterality: N/A;   TEE WITHOUT CARDIOVERSION N/A 04/30/2014   TEE WITHOUT CARDIOVERSION N/A 09/29/2017   Procedure: TRANSESOPHAGEAL ECHOCARDIOGRAM (TEE);  Surgeon: Matt Song, Donata Fryer, MD;  Location: [facility] OR;  Service: Open Heart Surgery;  Laterality: N/A;    Social History   Socioeconomic History   Marital status: Married    Spouse name: Not on file   Number of children: 0   Years of education: Not on file   Highest education level: Not on file  Occupational History   Not on file  Tobacco Use   Smoking status: Former    Current packs/day: 0.00    Average packs/day: 0.3 packs/day for 30.0 years (7.5 ttl pk-yrs)    Types: Cigarettes    Start date: 09/27/1977    Quit date: 09/28/2007    Years since quitting: 16.6   Smokeless tobacco: Never  Vaping Use   Vaping status: Never Used  Substance and Sexual Activity   Alcohol use: Yes    Comment: occasionally   Drug use: Yes    Frequency: 7.0 times per week    Types: Marijuana    Comment: patient says he smokes marajuana once a day   Sexual activity: Yes  Other Topics Concern   Not on file  Social History Narrative   Married, walks for exercise, step- son lives in [place], [place].  05/2019   Social Drivers of Health   Financial Resource Strain: Low Risk  (05/23/2024)   Overall Financial Resource Strain (CARDIA)    Difficulty of Paying Living Expenses: Not hard at all  Food Insecurity: No Food Insecurity (12/03/2022)   Hunger Vital Sign    Worried About Running Out of Food in the Last Year: Never true    Ran Out of Food in the Last Year: Never true  Transportation Needs: No Transportation Needs (12/17/2021)   PRAPARE - Administrator, Civil Service (Medical): No    Lack of Transportation (Non-Medical): No  Physical Activity: Sufficiently Active (05/23/2024)   Exercise Vital Sign    Days of Exercise per Week: 3 days     Minutes of Exercise per Session: 90 min  Stress: No Stress Concern Present (05/23/2024)   [employer] of Occupational Health - Occupational Stress Questionnaire    Feeling of Stress : Not at all  Social Connections: Moderately Integrated (05/23/2024)   Social Connection and Isolation Panel [NHANES]    Frequency of Communication with Friends and Family: Twice a week    Frequency of Social Gatherings with Friends and Family: More than three times a week    Attends Religious Services: More than 4 times per year    Active Member of [employer] or Organizations: No    Attends Banker Meetings: Never    Marital Status: Married  Catering manager Violence: Not At Risk (05/23/2024)  Humiliation, Afraid, Rape, and Kick questionnaire    Fear of Current or Ex-Partner: No    Emotionally Abused: No    Physically Abused: No    Sexually Abused: No    Family History  Problem Relation Age of Onset   Other Mother        died of old age   Heart disease Father    Sleep apnea Sister    Diabetes Brother        diabetic coma x 3 years   Alcohol abuse Brother    Heart disease Brother 65       MI   Cancer Neg Hx    Stroke Neg Hx    Rectal cancer Neg Hx    Esophageal cancer Neg Hx    Stomach cancer Neg Hx      Current Outpatient Medications:    acetaminophen  (TYLENOL ) 500 MG tablet, Take 1,000 mg by mouth in the morning and at bedtime., Disp: , Rfl:    amLODipine  (NORVASC ) 10 MG tablet, TAKE 1 TABLET BY MOUTH EVERY DAY, Disp: 90 tablet, Rfl: 3   aspirin  EC 81 MG tablet, Take 81 mg by mouth daily., Disp: , Rfl:    empagliflozin  (JARDIANCE ) 10 MG TABS tablet, Take 1 tablet (10 mg total) by mouth daily., Disp: 90 tablet, Rfl: 2   ezetimibe  (ZETIA ) 10 MG tablet, Take 1 tablet (10 mg total) by mouth daily., Disp: 90 tablet, Rfl: 3   fenofibrate  (TRICOR ) 48 MG tablet, TAKE 1 TABLET BY MOUTH EVERY DAY, Disp: 90 tablet, Rfl: 3   ferrous gluconate  (FERGON) 324 MG tablet, Take 1 tablet (324 mg  total) by mouth 2 (two) times daily with a meal., Disp: 180 tablet, Rfl: 0   metFORMIN  (GLUCOPHAGE ) 500 MG tablet, 1 tablet in morning, 1/2 tablet in evening, Disp: 180 tablet, Rfl: 2   metoprolol  tartrate (LOPRESSOR ) 50 MG tablet, TAKE 1 TABLET BY MOUTH TWICE A DAY, Disp: 180 tablet, Rfl: 1   Multiple Vitamins-Minerals (CENTRUM SILVER 50+MEN PO), Take 1 tablet by mouth daily., Disp: , Rfl:    omeprazole  (PRILOSEC) 40 MG capsule, TAKE 1 CAPSULE (40 MG TOTAL) BY MOUTH DAILY., Disp: 90 capsule, Rfl: 3   rosuvastatin  (CRESTOR ) 40 MG tablet, Take 1 tablet (40 mg total) by mouth daily., Disp: 90 tablet, Rfl: 2   valsartan  (DIOVAN ) 160 MG tablet, TAKE 1 TABLET BY MOUTH EVERY DAY, Disp: 90 tablet, Rfl: 1   vitamin B-12 (CYANOCOBALAMIN) 50 MCG tablet, Take 50 mcg by mouth daily., Disp: , Rfl:    Blood Glucose Monitoring Suppl DEVI, Test 1-2 times day. Needs onetouch meter, Disp: 1 each, Rfl: 0   Glucose Blood (BLOOD GLUCOSE TEST STRIPS) STRP, Test 1-2 times daily. Uses onetouch meter, Disp: 100 strip, Rfl: 1   Lancet Device MISC, Use device to put lancet in, Disp: 1 each, Rfl: 0   Lancets Misc. MISC, 1-2 times a day. Needs onetouch lancets, Disp: 100 each, Rfl: 1  No Known Allergies  History reviewed: allergies, current medications, past family history, past medical history, past social history, past surgical history and problem list  Chronic issues discussed: HTN - compliant with medication  Hyperlipidemia - compliant with medications including changes made last visit  Taking iron daily  Taking aspirin  daily  Former smoker, quit years ago  Acute issues discussed: None    Objective:     Biometrics BP 124/70   Pulse 66   Ht 5' 7.5" (1.715 m)   Wt 190 lb 6.4 oz (  86.4 kg)   BMI 29.38 kg/m   Wt Readings from Last 3 Encounters:  05/23/24 190 lb 6.4 oz (86.4 kg)  12/12/23 190 lb 9.6 oz (86.5 kg)  12/08/23 191 lb 12.8 oz (87 kg)    Gen: wd, wn, nad HEENT: normocephalic, sclerae  anicteric, nares patent, no discharge or erythema, pharynx normal Oral cavity: MMM, no lesions Neck: supple, no lymphadenopathy, no thyromegaly, no masses Chest: vertical surgical scar Heart: RRR, normal S1, S2, no murmurs Lungs: CTA bilaterally, no wheezes, rhonchi, or rales Abdomen: +bs, soft, non tender, non distended, no masses, no hepatomegaly, no splenomegaly Musculoskeletal: nontender, no swelling, no obvious deformity Extremities: no edema, no cyanosis, no clubbing Pulses: 2+ symmetric, upper and lower extremities, normal cap refill Neurological: alert, oriented x 3, CN2-12 intact, strength normal upper extremities and lower extremities, sensation normal throughout, DTRs 2+ throughout, no cerebellar signs, gait normal Psychiatric: normal affect, behavior normal, pleasant  GU/rectal - deferred    Assessment:   Encounter Diagnoses  Name Primary?   Routine general medical examination at a health care facility Yes   Type 2 diabetes mellitus with complication, without long-term current use of insulin  (HCC)    Encounter for subsequent annual wellness visit (AWV) in Medicare patient    Abnormal CT of the chest    Encounter for health maintenance examination in adult    Pacemaker Medtronic Azure XT DR MRI [id number] Dual chamber pacemaker in situ    Hyperlipidemia associated with type 2 diabetes mellitus (HCC)    Former smoker    Essential hypertension    Advanced directives, counseling/discussion    Anemia, unspecified type    Aortic atherosclerosis (HCC)    Atherosclerosis of native artery of both lower extremities with intermittent claudication (HCC)    Vaccine counseling    PVD (peripheral vascular disease) (HCC)    Screening for prostate cancer      Plan:   A preventative services visit was completed today.  During the course of the visit today, we discussed and counseled about appropriate screening and preventive services.  A health risk assessment was established today  that included a review of current medications, allergies, social history, family history, medical and preventative health history, biometrics, and preventative screenings to identify potential safety concerns or impairments.  A personalized plan was printed today for your records and use.   Personalized health advice and education was given today to reduce health risks and promote self management and wellness.  Information regarding end of life planning was discussed today.  Conditions/risks identified: No new issues  Chronic problems discussed today: Abnormal chest CT - he didn't return calls about referral in fall.  New referral today for pulmonology consult  Dyslipidemia - updated labs today.   Continue fenofibrate  48mg  daily, zetia  10mg  daily, crestor  40mg  daily  HTN - continue Amlodipine  10mg  daily, valsartan  160mg  daily, lopressor  50mg  BID  Hx/o iron deficiency - continue fergon daily, CBC labs today  Diabetes - continue Metformin  500mg  1 in morning, 1/2 tablet in evening, jardiance  10mg  daily    Acute problems discussed today: none  Recommendations: I recommend a yearly ophthalmology/optometry visit for glaucoma screening and eye checkup I recommended a yearly dental visit for hygiene and checkup  Advanced Directives: I recommend you consider completing a Health Care Power of Attorney and Living Will.   These documents respect your wishes and help alleviate burdens on your loved ones if you were to become terminally ill or be in a position to need those  documents enforced.    You can complete Advanced Directives yourself, have them notarized, then have copies made for our office, for you and for anybody you feel should have them in safe keeping.  Or, you can have an attorney prepare these documents.   If you haven't updated your Last Will and Testament in a while, it may be worthwhile having an attorney prepare these documents together and save on some costs.      Cancer  screens: Up to date on colonoscopy from 05/2022, no polyps or other worrisome findings.  Discussed PSA testing, prostate cancer screening.  Updated PSA today  Yearly CT lung cancer screen given abnormal CT chest 2/204   Referrals today: Pulmonology regarding abnormal CT chest fall 2024 showing possible asbestos plaques    Immunizations: Immunization History  Administered Date(s) Administered   Fluad Quad(high Dose 65+) 09/09/2020, 09/01/2021, 10/04/2022   Fluad Trivalent(High Dose 65+) 08/23/2023   Influenza, High Dose Seasonal PF 10/19/2018, 11/01/2019, 11/01/2019   Influenza,inj,Quad PF,6+ Mos 02/03/2016, 11/04/2016   PFIZER(Purple Top)SARS-COV-2 Vaccination 02/19/2020, 03/11/2020, 11/28/2020   Pneumococcal Conjugate-13 06/15/2018   Pneumococcal Polysaccharide-23 11/13/2021   Zoster Recombinant(Shingrix) 03/10/2023, 07/30/2023    I recommended a yearly influenza vaccine, typically in September when the vaccine is usually available  I recommend Tetanus Tdap vaccine at your pharmacy  Consider updated Prevnar 20 pneumococcal vaccine  Counseled on the pneumococcal vaccine.   Pneumococcal vaccine Prevnar 20 given after consent obtained.   Woody was seen today for annual exam.  Diagnoses and all orders for this visit:  Routine general medical examination at a health care facility -     PSA -     Ambulatory referral to Pulmonology -     Lipid panel -     Urinalysis, Routine w reflex microscopic -     CBC  Type 2 diabetes mellitus with complication, without long-term current use of insulin  (HCC) -     Hemoglobin A1c -     Comprehensive metabolic panel -     Microalbumin / creatinine urine ratio  Encounter for subsequent annual wellness visit (AWV) in Medicare patient  Abnormal CT of the chest -     Ambulatory referral to Pulmonology  Encounter for health maintenance examination in adult  Pacemaker Medtronic Azure XT DR MRI [id number] Dual chamber pacemaker in  situ  Hyperlipidemia associated with type 2 diabetes mellitus (HCC) -     Lipid panel  Former smoker -     Ambulatory referral to Pulmonology  Essential hypertension  Advanced directives, counseling/discussion  Anemia, unspecified type  Aortic atherosclerosis (HCC) -     Lipid panel  Atherosclerosis of native artery of both lower extremities with intermittent claudication (HCC)  Vaccine counseling  PVD (peripheral vascular disease) (HCC)  Screening for prostate cancer -     PSA    Medicare Attestation A preventative services visit was completed today.  During the course of the visit the patient was educated and counseled about appropriate screening and preventive services.  A health risk assessment was established with the patient that included a review of current medications, allergies, social history, family history, medical and preventative health history, biometrics, and preventative screenings to identify potential safety concerns or impairments.  A personalized plan was printed today for the patient's records and use.   Personalized health advice and education was given today to reduce health risks and promote self management and wellness.  Information regarding end of life planning was discussed today.  Lovett Ruck, PA-C  05/23/2024   

## 2024-05-23 NOTE — Patient Instructions (Signed)
 A personalized plan was printed today for your records and use.   Personalized health advice and education was given today to reduce health risks and promote self management and wellness.  Information regarding end of life planning was discussed today.  Conditions/risks identified: No new issues  Chronic problems discussed today: Abnormal chest CT - he didn't return calls about referral in fall.  New referral today for pulmonology consult  Dyslipidemia - updated labs today.   Continue fenofibrate  48mg  daily, zetia  10mg  daily, crestor  40mg  daily  HTN - continue Amlodipine  10mg  daily, valsartan  160mg  daily, lopressor  50mg  BID  Hx/o iron deficiency - continue fergon daily, CBC labs today  Diabetes - continue Metformin  500mg  1 in morning, 1/2 tablet in evening, jardiance  10mg  daily    Acute problems discussed today: none  Recommendations: I recommend a yearly ophthalmology/optometry visit for glaucoma screening and eye checkup I recommended a yearly dental visit for hygiene and checkup  Advanced Directives: I recommend you consider completing a Health Care Power of Attorney and Living Will.   These documents respect your wishes and help alleviate burdens on your loved ones if you were to become terminally ill or be in a position to need those documents enforced.    You can complete Advanced Directives yourself, have them notarized, then have copies made for our office, for you and for anybody you feel should have them in safe keeping.  Or, you can have an attorney prepare these documents.   If you haven't updated your Last Will and Testament in a while, it may be worthwhile having an attorney prepare these documents together and save on some costs.      Cancer screens: Up to date on colonoscopy from 05/2022, no polyps or other worrisome findings.  Discussed PSA testing, prostate cancer screening.  Updated PSA today  Yearly CT lung cancer screen given abnormal CT chest  2/204   Referrals today: Pulmonology regarding abnormal CT chest fall 2024 showing possible asbestos plaques    Immunizations: Immunization History  Administered Date(s) Administered   Fluad Quad(high Dose 65+) 09/09/2020, 09/01/2021, 10/04/2022   Fluad Trivalent(High Dose 65+) 08/23/2023   Influenza, High Dose Seasonal PF 10/19/2018, 11/01/2019, 11/01/2019   Influenza,inj,Quad PF,6+ Mos 02/03/2016, 11/04/2016   PFIZER(Purple Top)SARS-COV-2 Vaccination 02/19/2020, 03/11/2020, 11/28/2020   Pneumococcal Conjugate-13 06/15/2018   Pneumococcal Polysaccharide-23 11/13/2021   Zoster Recombinant(Shingrix) 03/10/2023, 07/30/2023    I recommended a yearly influenza vaccine, typically in September when the vaccine is usually available  I recommend Tetanus Tdap vaccine at your pharmacy  Consider updated Prevnar 20 pneumococcal vaccine  Counseled on the pneumococcal vaccine.   Pneumococcal vaccine Prevnar 20 given after consent obtained.

## 2024-05-24 ENCOUNTER — Ambulatory Visit (INDEPENDENT_AMBULATORY_CARE_PROVIDER_SITE_OTHER): Payer: PPO | Admitting: Podiatry

## 2024-05-24 ENCOUNTER — Encounter: Payer: Self-pay | Admitting: Podiatry

## 2024-05-24 DIAGNOSIS — M79609 Pain in unspecified limb: Secondary | ICD-10-CM

## 2024-05-24 DIAGNOSIS — I739 Peripheral vascular disease, unspecified: Secondary | ICD-10-CM | POA: Diagnosis not present

## 2024-05-24 DIAGNOSIS — E118 Type 2 diabetes mellitus with unspecified complications: Secondary | ICD-10-CM | POA: Diagnosis not present

## 2024-05-24 DIAGNOSIS — B351 Tinea unguium: Secondary | ICD-10-CM

## 2024-05-24 LAB — MICROSCOPIC EXAMINATION
Bacteria, UA: NONE SEEN
Casts: NONE SEEN /LPF
Epithelial Cells (non renal): NONE SEEN /HPF (ref 0–10)
RBC, Urine: NONE SEEN /HPF (ref 0–2)
WBC, UA: NONE SEEN /HPF (ref 0–5)

## 2024-05-24 LAB — URINALYSIS, ROUTINE W REFLEX MICROSCOPIC
Bilirubin, UA: NEGATIVE
Ketones, UA: NEGATIVE
Leukocytes,UA: NEGATIVE
Nitrite, UA: NEGATIVE
Protein,UA: NEGATIVE
Specific Gravity, UA: >=1.03 — AB (ref 1.005–1.030)
Urobilinogen, Ur: 0.2 mg/dL (ref 0.2–1.0)
pH, UA: 5.5 (ref 5.0–7.5)

## 2024-05-24 LAB — LIPID PANEL

## 2024-05-24 NOTE — Progress Notes (Signed)
 This patient returns to my office for at risk foot care.  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  This patient is unable to cut nails himself since the patient cannot reach his nails.These nails are painful walking and wearing shoes.  This patient presents for at risk foot care today.  General Appearance  Alert, conversant and in no acute stress.  Vascular  Dorsalis pedis and posterior tibial  pulses are  weakly palpable  bilaterally.  Capillary return is within normal limits  bilaterally. Temperature is within normal limits  bilaterally.  Neurologic  Senn-Weinstein monofilament wire test within normal limits  bilaterally. Muscle power within normal limits bilaterally.  Nails Thick disfigured discolored nails with subungual debris  from hallux to fifth toes bilaterally. No evidence of bacterial infection or drainage bilaterally.  Orthopedic  No limitations of motion  feet .  No crepitus or effusions noted.  No bony pathology or digital deformities noted.  Skin  normotropic skin with no porokeratosis noted bilaterally.  No signs of infections or ulcers noted.     Onychomycosis  Pain in right toes  Pain in left toes  Consent was obtained for treatment procedures.   Mechanical debridement of nails 1-5  bilaterally performed with a nail nipper.  Filed with dremel without incident.    Return office visit    3 months                 Told patient to return for periodic foot care and evaluation due to potential at risk complications.   Helane Gunther DPM

## 2024-05-26 LAB — COMPREHENSIVE METABOLIC PANEL WITH GFR
ALT: 18 IU/L (ref 0–44)
AST: 19 IU/L (ref 0–40)
Albumin: 4.4 g/dL (ref 3.8–4.8)
Alkaline Phosphatase: 109 IU/L (ref 44–121)
BUN/Creatinine Ratio: 12 (ref 10–24)
BUN: 13 mg/dL (ref 8–27)
Bilirubin Total: 0.2 mg/dL (ref 0.0–1.2)
Calcium: 10 mg/dL (ref 8.6–10.2)
Chloride: 106 mmol/L (ref 96–106)
Creatinine, Ser: 1.11 mg/dL (ref 0.76–1.27)
Globulin, Total: 2.1 g/dL (ref 1.5–4.5)
Glucose: 85 mg/dL (ref 70–99)
Potassium: 4.1 mmol/L (ref 3.5–5.2)
Sodium: 143 mmol/L (ref 134–144)
Total Protein: 6.5 g/dL (ref 6.0–8.5)
eGFR: 71 mL/min/{1.73_m2} (ref >59)

## 2024-05-26 LAB — CBC
Hematocrit: 43.1 % (ref 37.5–51.0)
Hemoglobin: 14.1 g/dL (ref 13.0–17.7)
MCH: 30.3 pg (ref 26.6–33.0)
MCHC: 32.7 g/dL (ref 31.5–35.7)
MCV: 93 fL (ref 79–97)
Platelets: 158 10*3/uL (ref 150–450)
RBC: 4.65 x10E6/uL (ref 4.14–5.80)
RDW: 12 % (ref 11.6–15.4)
WBC: 6.2 10*3/uL (ref 3.4–10.8)

## 2024-05-26 LAB — LIPID PANEL
Chol/HDL Ratio: 3.3 ratio (ref 0.0–5.0)
Cholesterol, Total: 108 mg/dL (ref 100–199)
HDL: 33 mg/dL — ABNORMAL LOW (ref >39)
LDL Chol Calc (NIH): 45 mg/dL (ref 0–99)
Triglycerides: 185 mg/dL — ABNORMAL HIGH (ref 0–149)
VLDL Cholesterol Cal: 30 mg/dL (ref 5–40)

## 2024-05-26 LAB — HEMOGLOBIN A1C
Est. average glucose Bld gHb Est-mCnc: 126 mg/dL
Hgb A1c MFr Bld: 6 % — ABNORMAL HIGH (ref 4.8–5.6)

## 2024-05-26 LAB — MICROALBUMIN / CREATININE URINE RATIO
Creatinine, Urine: 79.9 mg/dL
Microalb/Creat Ratio: <4 mg/g{creat} (ref 0–29)
Microalbumin, Urine: <3 ug/mL

## 2024-05-26 LAB — PSA: Prostate Specific Ag, Serum: 1.5 ng/mL (ref 0.0–4.0)

## 2024-05-28 ENCOUNTER — Other Ambulatory Visit: Payer: Self-pay | Admitting: Medical

## 2024-05-28 ENCOUNTER — Ambulatory Visit: Payer: Self-pay | Admitting: Medical

## 2024-05-28 DIAGNOSIS — E118 Type 2 diabetes mellitus with unspecified complications: Secondary | ICD-10-CM

## 2024-05-28 MED ORDER — EMPAGLIFLOZIN 10 MG PO TABS: 10.0000 mg | ORAL_TABLET | Freq: Every day | ORAL | 2 refills | Status: AC

## 2024-05-28 MED ORDER — ROSUVASTATIN CALCIUM 40 MG PO TABS: 40.0000 mg | ORAL_TABLET | Freq: Every day | ORAL | 2 refills | Status: AC

## 2024-05-28 MED ORDER — FERROUS GLUCONATE 324 (38 FE) MG PO TABS: 324.0000 mg | ORAL_TABLET | Freq: Every day | ORAL | 1 refills | Status: AC

## 2024-05-28 MED ORDER — METFORMIN HCL 500 MG PO TABS: ORAL_TABLET | ORAL | 2 refills | Status: AC

## 2024-05-28 NOTE — Progress Notes (Signed)
 Diabetes marker stable at 6.0.  Cholesterol looks okay.  Microalbumin kidney marker okay.  Prostate marker okay.  Blood count okay.  Electrolytes and kidney and liver are okay.  Continue current medications.  Make sure you answer the phone calls regarding pulmonology consult

## 2024-06-08 NOTE — Progress Notes (Signed)
 Remote pacemaker transmission.

## 2024-08-03 ENCOUNTER — Ambulatory Visit: Payer: PPO

## 2024-08-03 DIAGNOSIS — I442 Atrioventricular block, complete: Secondary | ICD-10-CM | POA: Diagnosis not present

## 2024-08-03 LAB — CUP PACEART REMOTE DEVICE CHECK
Battery Remaining Longevity: 33 mo
Battery Voltage: 2.92 V
Brady Statistic AP VP Percent: 21.44 %
Brady Statistic AP VS Percent: 0 %
Brady Statistic AS VP Percent: 78.52 %
Brady Statistic AS VS Percent: 0.04 %
Brady Statistic RA Percent Paced: 21.43 %
Brady Statistic RV Percent Paced: 99.96 %
Date Time Interrogation Session: 20250808035526
Implantable Lead Connection Status: 753985
Implantable Lead Connection Status: 753985
Implantable Lead Implant Date: 20181008
Implantable Lead Implant Date: 20181008
Implantable Lead Location: 753859
Implantable Lead Location: 753860
Implantable Lead Model: 3830
Implantable Lead Model: 5076
Implantable Pulse Generator Implant Date: 20181008
Lead Channel Impedance Value: 323 Ohm
Lead Channel Impedance Value: 323 Ohm
Lead Channel Impedance Value: 456 Ohm
Lead Channel Impedance Value: 475 Ohm
Lead Channel Pacing Threshold Amplitude: 0.875 V
Lead Channel Pacing Threshold Amplitude: 1 V
Lead Channel Pacing Threshold Pulse Width: 0.4 ms
Lead Channel Pacing Threshold Pulse Width: 0.4 ms
Lead Channel Sensing Intrinsic Amplitude: 11.5 mV
Lead Channel Sensing Intrinsic Amplitude: 11.5 mV
Lead Channel Sensing Intrinsic Amplitude: 3.375 mV
Lead Channel Sensing Intrinsic Amplitude: 3.375 mV
Lead Channel Setting Pacing Amplitude: 2 V
Lead Channel Setting Pacing Amplitude: 2 V
Lead Channel Setting Pacing Pulse Width: 0.4 ms
Lead Channel Setting Sensing Sensitivity: 2 mV
Zone Setting Status: 755011
Zone Setting Status: 755011

## 2024-08-04 ENCOUNTER — Ambulatory Visit: Payer: Self-pay | Admitting: Internal Medicine

## 2024-08-12 NOTE — Progress Notes (Unsigned)
  Electrophysiology Office Note:   Date:  08/15/2024  ID:  Jonathan Brown, DOB 30-Oct-1951, MRN [id number]  Primary Cardiologist: Gordy Bergamo, MD Primary Heart Failure: None Electrophysiologist: Danelle Birmingham, MD       History of Present Illness:   Jonathan Brown is a 73 y.o. male with h/o AV disease s/p replacement complicated by CHB requiring PPM seen today for routine electrophysiology followup.   Last seen in 2019 by Dr. Birmingham. He had been followed by Dr. Bergamo for device at [facility].    Since last being seen in our clinic the patient reports doing very well. No device related concerns.  Notes he is DM and continues to work and watches his legs closely as he is afraid of wounds (has an open abrasion on RLE).    He denies chest pain, palpitations, dyspnea, PND, orthopnea, nausea, vomiting, dizziness, syncope, edema, weight gain, or early satiety.   Review of systems complete and found to be negative unless listed in HPI.    EP Information / Studies Reviewed:    EKG is ordered today. Personal review as below.  EKG Interpretation Date/Time:  Wednesday August 15 2024 10:14:57 EDT Ventricular Rate:  79 PR Interval:  190 QRS Duration:  178 QT Interval:  430 QTC Calculation: 493 R Axis:   -7  Text Interpretation: Atrial-sensed ventricular-paced rhythm Confirmed by Aniceto Jarvis ([id number]) on 08/15/2024 11:12:45 AM   PPM Interrogation-  reviewed in detail today,  See PACEART report.  Device History: Medtronic Dual Chamber PPM implanted 10/03/2017 for CHB           Physical Exam:   VS:  BP 132/70   Pulse 79   Ht 5' 7.5 (1.715 m)   Wt 191 lb (86.6 kg)   SpO2 96%   BMI 29.47 kg/m    Wt Readings from Last 3 Encounters:  08/15/24 191 lb (86.6 kg)  05/23/24 190 lb 6.4 oz (86.4 kg)  12/12/23 190 lb 9.6 oz (86.5 kg)     GEN: Well nourished, well developed in no acute distress NECK: No JVD; No carotid bruits CARDIAC: Regular rate and rhythm, no murmurs, rubs, gallops, PPM  site wnl, no tethering   RESPIRATORY:  Clear to auscultation without rales, wheezing or rhonchi  ABDOMEN: Soft, non-tender, non-distended EXTREMITIES:  No edema; No deformity   ASSESSMENT AND PLAN:    CHB s/p Medtronic PPM  -Normal PPM function -dependent in V at 40 bpm  -See Pace Art report -No changes today -no episodes on device since 2024  Hypertension  -elevated on initial assessment > repeat WNL  VHD: AS s/p AVR  -per Cardiology   Disposition:   Follow up with Dr. Birmingham in 12 months  Signed, Jarvis Aniceto, NP-C, AGACNP-BC [facility] - Electrophysiology  08/15/2024, 11:14 AM

## 2024-08-15 ENCOUNTER — Ambulatory Visit: Attending: Pulmonary Disease | Admitting: Pulmonary Disease

## 2024-08-15 ENCOUNTER — Encounter: Payer: Self-pay | Admitting: Pulmonary Disease

## 2024-08-15 VITALS — BP 132/70 | HR 79 | Ht 67.5 in | Wt 191.0 lb

## 2024-08-15 DIAGNOSIS — Z952 Presence of prosthetic heart valve: Secondary | ICD-10-CM

## 2024-08-15 DIAGNOSIS — Z950 Presence of cardiac pacemaker: Secondary | ICD-10-CM

## 2024-08-15 DIAGNOSIS — I10 Essential (primary) hypertension: Secondary | ICD-10-CM | POA: Diagnosis not present

## 2024-08-15 DIAGNOSIS — I442 Atrioventricular block, complete: Secondary | ICD-10-CM | POA: Diagnosis not present

## 2024-08-15 LAB — CUP PACEART INCLINIC DEVICE CHECK
Date Time Interrogation Session: 20250820111854
Implantable Lead Connection Status: 753985
Implantable Lead Connection Status: 753985
Implantable Lead Implant Date: 20181008
Implantable Lead Implant Date: 20181008
Implantable Lead Location: 753859
Implantable Lead Location: 753860
Implantable Lead Model: 3830
Implantable Lead Model: 5076
Implantable Pulse Generator Implant Date: 20181008

## 2024-08-15 NOTE — Patient Instructions (Signed)
 Medication Instructions:  Your physician recommends that you continue on your current medications as directed. Please refer to the Current Medication list given to you today.  *If you need a refill on your cardiac medications before your next appointment, please call your pharmacy*  Lab Work: None ordered If you have labs (blood work) drawn today and your tests are completely normal, you will receive your results only by: MyChart Message (if you have MyChart) OR A paper copy in the mail If you have any lab test that is abnormal or we need to change your treatment, we will call you to review the results.  Follow-Up: At [facility], you and your health needs are our priority.  As part of our continuing mission to provide you with exceptional heart care, our providers are all part of one team.  This team includes your primary Cardiologist (physician) and Advanced Practice Providers or APPs (Physician Assistants and Nurse Practitioners) who all work together to provide you with the care you need, when you need it.  Your next appointment:   1 year(s)  Provider:   Daphne Barrack, NP

## 2024-08-22 ENCOUNTER — Ambulatory Visit

## 2024-08-22 VITALS — BP 150/78 | HR 76 | Ht 69.0 in | Wt 190.8 lb

## 2024-08-22 DIAGNOSIS — J929 Pleural plaque without asbestos: Secondary | ICD-10-CM | POA: Diagnosis not present

## 2024-08-22 DIAGNOSIS — I700 Atherosclerosis of aorta: Secondary | ICD-10-CM | POA: Diagnosis not present

## 2024-08-22 DIAGNOSIS — I5030 Unspecified diastolic (congestive) heart failure: Secondary | ICD-10-CM

## 2024-08-22 DIAGNOSIS — R0600 Dyspnea, unspecified: Secondary | ICD-10-CM | POA: Diagnosis not present

## 2024-08-22 DIAGNOSIS — J441 Chronic obstructive pulmonary disease with (acute) exacerbation: Secondary | ICD-10-CM | POA: Diagnosis not present

## 2024-08-22 LAB — BRAIN NATRIURETIC PEPTIDE: Pro B Natriuretic peptide (BNP): 33 pg/mL (ref 0.0–100.0)

## 2024-08-22 MED ORDER — ALBUTEROL SULFATE HFA 108 (90 BASE) MCG/ACT IN AERS
2.0000 | INHALATION_SPRAY | Freq: Four times a day (QID) | RESPIRATORY_TRACT | 6 refills | Status: AC | PRN
Start: 2024-08-22 — End: ?

## 2024-08-22 MED ORDER — UMECLIDINIUM-VILANTEROL 62.5-25 MCG/ACT IN AEPB: 1.0000 | INHALATION_SPRAY | Freq: Every day | RESPIRATORY_TRACT | 3 refills | Status: AC

## 2024-08-22 NOTE — Assessment & Plan Note (Signed)
 Stable since 2020. ?hx of asbestos. Monitor.

## 2024-08-22 NOTE — Progress Notes (Addendum)
 New Patient Pulmonology Office Visit   Subjective:  Patient ID: Jonathan Brown, male    DOB: [birth year]  MRN: [id number]  Referred by: Bulah Alm RAMAN, PA-C  CC:  Chief Complaint  Patient presents with   Consult    PCP referred, pt states PCP wanted to check for emphysema.  Pt reports no coughing or SOB.     HPI Jonathan Brown is a 73 y.o. male with hypertension, PVD, GERD, DM2, pleural plaque, AV disease status post replacement complicated by CHB needing PPM, CAD s/p PCI. Presenting for evaluation of emphysema and pleural plaques.   No cough. Has some nasal congestion. Intermittent wheezing, chest tightness since PCI. Does not feel sob.  No fam hx of asthma. Has pollen allergies. No prior inhaler.   Lung Health: Functional status: 30 min for 1 block and will get SOB.  ET has reduced for last 3 years.  Covid vaccine: 2 Influenza vaccine: na Pneumonococcal vaccine: with PCP.  Smoking: 1/2 pk/week x 10 yrs. 0.7 pkyr. Quit >30 yrs ago.  Occupational exposure/pets: used to work in Museum/gallery curator. Polished wood. Did not wear mask initially. No pets. No mold.    CT chest 08/2023: Reviewed by me.  Underlying emphysema.  Scattered pleural plaques.  Per read stable as before.  Has some mediastinal lymphadenopathy which per read is stable.  Stable since 2020 per read.  PFT: 2018: No obstructive or restrictive lung disease.  DLCO mildly decreased. Spiro 2022: suggestive of COPD.  Prior TTE 2018: EF 45 and 50%.  Mild to moderate TR. TTE 2023: EF 55-60%, mild LVH, grade II DD. RVSP 34  Allergies: Patient has no known allergies.  Current Outpatient Medications:    acetaminophen  (TYLENOL ) 500 MG tablet, Take 1,000 mg by mouth in the morning and at bedtime., Disp: , Rfl:    albuterol  (VENTOLIN  HFA) 108 (90 Base) MCG/ACT inhaler, Inhale 2 puffs into the lungs every 6 (six) hours as needed for wheezing or shortness of breath., Disp: 8 g, Rfl: 6   amLODipine  (NORVASC ) 10 MG  tablet, TAKE 1 TABLET BY MOUTH EVERY DAY, Disp: 90 tablet, Rfl: 3   aspirin  EC 81 MG tablet, Take 81 mg by mouth daily., Disp: , Rfl:    Blood Glucose Monitoring Suppl DEVI, Test 1-2 times day. Needs onetouch meter, Disp: 1 each, Rfl: 0   empagliflozin  (JARDIANCE ) 10 MG TABS tablet, Take 1 tablet (10 mg total) by mouth daily., Disp: 90 tablet, Rfl: 2   ezetimibe  (ZETIA ) 10 MG tablet, Take 1 tablet (10 mg total) by mouth daily., Disp: 90 tablet, Rfl: 3   fenofibrate  (TRICOR ) 48 MG tablet, TAKE 1 TABLET BY MOUTH EVERY DAY, Disp: 90 tablet, Rfl: 3   ferrous gluconate  (FERGON) 324 MG tablet, Take 1 tablet (324 mg total) by mouth daily with breakfast., Disp: 90 tablet, Rfl: 1   Glucose Blood (BLOOD GLUCOSE TEST STRIPS) STRP, Test 1-2 times daily. Uses onetouch meter, Disp: 100 strip, Rfl: 1   Lancet Device MISC, Use device to put lancet in, Disp: 1 each, Rfl: 0   Lancets Misc. MISC, 1-2 times a day. Needs onetouch lancets, Disp: 100 each, Rfl: 1   metFORMIN  (GLUCOPHAGE ) 500 MG tablet, 1 tablet in morning, 1/2 tablet in evening, Disp: 180 tablet, Rfl: 2   metoprolol  tartrate (LOPRESSOR ) 50 MG tablet, TAKE 1 TABLET BY MOUTH TWICE A DAY, Disp: 180 tablet, Rfl: 1   Multiple Vitamins-Minerals (CENTRUM SILVER 50+MEN PO), Take 1 tablet by mouth daily., Disp: ,  Rfl:    omeprazole  (PRILOSEC) 40 MG capsule, TAKE 1 CAPSULE (40 MG TOTAL) BY MOUTH DAILY., Disp: 90 capsule, Rfl: 3   rosuvastatin  (CRESTOR ) 40 MG tablet, Take 1 tablet (40 mg total) by mouth daily., Disp: 90 tablet, Rfl: 2   umeclidinium-vilanterol (ANORO ELLIPTA ) 62.5-25 MCG/ACT AEPB, Inhale 1 puff into the lungs daily., Disp: 1 each, Rfl: 3   valsartan  (DIOVAN ) 160 MG tablet, TAKE 1 TABLET BY MOUTH EVERY DAY, Disp: 90 tablet, Rfl: 1   vitamin B-12 (CYANOCOBALAMIN) 50 MCG tablet, Take 50 mcg by mouth daily., Disp: , Rfl:  Past Medical History:  Diagnosis Date   Anemia    Arthritis    Cataract 02/26/2021   Bilateral   CHB (complete heart block)  (HCC) 10/05/2017   Diabetes mellitus without complication (HCC) 2014   type 2    Encounter for care of pacemaker 07/20/2019   Former smoker    GERD (gastroesophageal reflux disease)    History of colon polyps    Hyperlipidemia    Hypertension 2012   Open-angle glaucoma 02/26/2021   Pacemaker Medtronic Azure XT DR MRI [id number] Dual chamber pacemaker in situ 10/03/2017   PVD (peripheral vascular disease) (HCC)    S/P AVR    Aortic valve replacement using a 21-mm pericardial Magna Ease (09/29/2017 by Dr. 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    wife is able to read to him   Past Surgical History:  Procedure Laterality Date   AORTIC VALVE REPLACEMENT N/A 09/29/2017   Procedure: AORTIC VALVE REPLACEMENT (AVR) using 21mm Magna Ease valve;  Surgeon: Fleeta Hanford Coy, MD;  Location: [facility] OR;  Service: Open Heart Surgery;  Laterality: N/A;   CARDIAC CATHETERIZATION  07/12/2017   COLONOSCOPY  2008   LEFT AND RIGHT HEART CATHETERIZATION WITH CORONARY ANGIOGRAM N/A 04/02/2014   Procedure: LEFT AND RIGHT HEART CATHETERIZATION WITH CORONARY ANGIOGRAM;  Surgeon: Erick JONELLE Bergamo, MD;  Location: [facility] CATH LAB;  Service: Cardiovascular;  Laterality: N/A;   PACEMAKER IMPLANT N/A 10/03/2017   Procedure: PACEMAKER IMPLANT;  Surgeon: Waddell Danelle ORN, MD;  Location: [facility] INVASIVE CV LAB;  Service: Cardiovascular;  Laterality: N/A;   TEE WITHOUT CARDIOVERSION N/A 04/30/2014   TEE WITHOUT CARDIOVERSION N/A 09/29/2017   Procedure: TRANSESOPHAGEAL ECHOCARDIOGRAM (TEE);  Surgeon: Fleeta Hanford, Coy, MD;  Location: [facility] OR;  Service: Open Heart Surgery;  Laterality: N/A;   Family History  Problem Relation Age of Onset   Other Mother        died of old age   Heart disease Father    Sleep apnea Sister    Diabetes Brother        diabetic coma x 3 years   Alcohol abuse Brother    Heart disease Brother 2       MI    Cancer Neg Hx    Stroke Neg Hx    Rectal cancer Neg Hx    Esophageal cancer Neg Hx    Stomach cancer Neg Hx    Social History   Socioeconomic History   Marital status: Married    Spouse name: Not on file   Number of children: 0   Years of education: Not on file   Highest education level: Not on file  Occupational History   Not on file  Tobacco Use   Smoking status: Former    Current packs/day: 0.00  Average packs/day: 0.3 packs/day for 30.0 years (7.5 ttl pk-yrs)    Types: Cigarettes    Start date: 09/27/1977    Quit date: 09/28/2007    Years since quitting: 16.9   Smokeless tobacco: Never   Tobacco comments:    No cigarettes but still smokes marijuana everyday.   Vaping Use   Vaping status: Never Used  Substance and Sexual Activity   Alcohol use: Yes    Comment: occasionally   Drug use: Yes    Frequency: 7.0 times per week    Types: Marijuana    Comment: patient says he smokes marajuana once a day   Sexual activity: Yes  Other Topics Concern   Not on file  Social History Narrative   Married, walks for exercise, step- son lives in [place], [place].  05/2019   Social Drivers of Health   Financial Resource Strain: Low Risk  (05/23/2024)   Overall Financial Resource Strain (CARDIA)    Difficulty of Paying Living Expenses: Not hard at all  Food Insecurity: No Food Insecurity (12/03/2022)   Hunger Vital Sign    Worried About Running Out of Food in the Last Year: Never true    Ran Out of Food in the Last Year: Never true  Transportation Needs: No Transportation Needs (12/17/2021)   PRAPARE - Administrator, Civil Service (Medical): No    Lack of Transportation (Non-Medical): No  Physical Activity: Sufficiently Active (05/23/2024)   Exercise Vital Sign    Days of Exercise per Week: 3 days    Minutes of Exercise per Session: 90 min  Stress: No Stress Concern Present (05/23/2024)   [employer] of Occupational Health - Occupational Stress Questionnaire     Feeling of Stress : Not at all  Social Connections: Moderately Integrated (05/23/2024)   Social Connection and Isolation Panel    Frequency of Communication with Friends and Family: Twice a week    Frequency of Social Gatherings with Friends and Family: More than three times a week    Attends Religious Services: More than 4 times per year    Active Member of [employer] or Organizations: No    Attends Banker Meetings: Never    Marital Status: Married  Catering manager Violence: Not At Risk (05/23/2024)   Humiliation, Afraid, Rape, and Kick questionnaire    Fear of Current or Ex-Partner: No    Emotionally Abused: No    Physically Abused: No    Sexually Abused: No       Objective:  BP (!) 150/78   Pulse 76   Ht 5' 9 (1.753 m)   Wt 190 lb 12.8 oz (86.5 kg)   SpO2 98% Comment: RA  BMI 28.18 kg/m  BMI Readings from Last 3 Encounters:  08/22/24 28.18 kg/m  08/15/24 29.47 kg/m  05/23/24 29.38 kg/m    General: Distress/not in distress patient appearing ill/healthy Lungs: Prolonged expiratory breath sounds with some wheezing. Heart: regular rate rhythm, no murmur appreciated.  Abdomen: non tender, non distended. Normal BS.  Neuro: axox3.  Moving all extremiteis.  Pitting leg edema b/l 1-2+   Diagnostic Review:  Last metabolic panel Lab Results  Component Value Date   GLUCOSE 85 05/23/2024   NA 143 05/23/2024   K 4.1 05/23/2024   CL 106 05/23/2024   CO2 CANCELED 05/23/2024   BUN 13 05/23/2024   CREATININE 1.11 05/23/2024   EGFR 71 05/23/2024   CALCIUM  10.0 05/23/2024   PHOS 2.5 (L) 09/01/2021   PROT 6.5  05/23/2024   ALBUMIN  4.4 05/23/2024   LABGLOB 2.1 05/23/2024   AGRATIO 1.6 02/03/2023   BILITOT 0.2 05/23/2024   ALKPHOS 109 05/23/2024   AST 19 05/23/2024   ALT 18 05/23/2024   ANIONGAP 11 09/28/2021       Assessment & Plan:   Assessment & Plan COPD with acute exacerbation (HCC) Based on CT scan and prior spirometry you have COPD.  Get CXR.  -  Take Anoro Ellipta  1 puff daily -Take albuterol  as needed. PFT Diastolic congestive heart failure, unspecified HF chronicity (HCC) BNP If low DLCO disproportionately will repeat ECHO.  Pleural plaque Stable since 2020. ?hx of asbestos. Monitor.   Orders Placed This Encounter  Procedures   DG Chest 2 View   B Nat Peptide   Pulmonary function test      Return in about 3 months (around 11/22/2024) for sch after PFTs.   Marian Grandt, MD

## 2024-08-22 NOTE — Patient Instructions (Addendum)
 Based on CT scan and prior spirometry you have COPD.  Get CXR.  - Take Anoro Ellipta  1 puff daily -Take albuterol  as needed.  Please get blood work today.  It is to evaluate if you have any fluid on your lung.

## 2024-08-24 ENCOUNTER — Ambulatory Visit (INDEPENDENT_AMBULATORY_CARE_PROVIDER_SITE_OTHER): Admitting: Podiatry

## 2024-08-24 ENCOUNTER — Encounter: Payer: Self-pay | Admitting: Podiatry

## 2024-08-24 DIAGNOSIS — B351 Tinea unguium: Secondary | ICD-10-CM

## 2024-08-24 DIAGNOSIS — M79609 Pain in unspecified limb: Secondary | ICD-10-CM | POA: Diagnosis not present

## 2024-08-24 DIAGNOSIS — I739 Peripheral vascular disease, unspecified: Secondary | ICD-10-CM | POA: Diagnosis not present

## 2024-08-24 DIAGNOSIS — E118 Type 2 diabetes mellitus with unspecified complications: Secondary | ICD-10-CM

## 2024-08-24 NOTE — Progress Notes (Signed)
 This patient returns to my office for at risk foot care.  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  This patient is unable to cut nails himself since the patient cannot reach his nails.These nails are painful walking and wearing shoes.  This patient presents for at risk foot care today.  General Appearance  Alert, conversant and in no acute stress.  Vascular  Dorsalis pedis and posterior tibial  pulses are  weakly palpable  bilaterally.  Capillary return is within normal limits  bilaterally. Temperature is within normal limits  bilaterally.  Neurologic  Senn-Weinstein monofilament wire test within normal limits  bilaterally. Muscle power within normal limits bilaterally.  Nails Thick disfigured discolored nails with subungual debris  from hallux to fifth toes bilaterally. No evidence of bacterial infection or drainage bilaterally.  Orthopedic  No limitations of motion  feet .  No crepitus or effusions noted.  No bony pathology or digital deformities noted.  Skin  normotropic skin with no porokeratosis noted bilaterally.  No signs of infections or ulcers noted.     Onychomycosis  Pain in right toes  Pain in left toes  Consent was obtained for treatment procedures.   Mechanical debridement of nails 1-5  bilaterally performed with a nail nipper.  Filed with dremel without incident.    Return office visit    3 months                 Told patient to return for periodic foot care and evaluation due to potential at risk complications.   Helane Gunther DPM

## 2024-09-02 ENCOUNTER — Ambulatory Visit: Payer: Self-pay

## 2024-09-21 ENCOUNTER — Emergency Department (HOSPITAL_COMMUNITY)

## 2024-09-21 ENCOUNTER — Encounter (HOSPITAL_COMMUNITY): Payer: Self-pay

## 2024-09-21 ENCOUNTER — Other Ambulatory Visit: Payer: Self-pay

## 2024-09-21 ENCOUNTER — Telehealth: Payer: Self-pay | Admitting: Internal Medicine

## 2024-09-21 ENCOUNTER — Observation Stay (HOSPITAL_COMMUNITY)
Admission: EM | Admit: 2024-09-21 | Discharge: 2024-09-24 | Disposition: A | Discharge status: Home / Self Care | Attending: Surgery | Admitting: Surgery

## 2024-09-21 DIAGNOSIS — S0990XA Unspecified injury of head, initial encounter: Secondary | ICD-10-CM | POA: Diagnosis not present

## 2024-09-21 DIAGNOSIS — J942 Hemothorax: Principal | ICD-10-CM

## 2024-09-21 DIAGNOSIS — E1141 Type 2 diabetes mellitus with diabetic mononeuropathy: Secondary | ICD-10-CM | POA: Diagnosis not present

## 2024-09-21 DIAGNOSIS — S271XXA Traumatic hemothorax, initial encounter: Secondary | ICD-10-CM | POA: Diagnosis not present

## 2024-09-21 DIAGNOSIS — I442 Atrioventricular block, complete: Secondary | ICD-10-CM | POA: Insufficient documentation

## 2024-09-21 DIAGNOSIS — E785 Hyperlipidemia, unspecified: Secondary | ICD-10-CM | POA: Diagnosis not present

## 2024-09-21 DIAGNOSIS — Y92410 Unspecified street and highway as the place of occurrence of the external cause: Secondary | ICD-10-CM | POA: Insufficient documentation

## 2024-09-21 DIAGNOSIS — I10 Essential (primary) hypertension: Secondary | ICD-10-CM | POA: Diagnosis not present

## 2024-09-21 DIAGNOSIS — S2242XA Multiple fractures of ribs, left side, initial encounter for closed fracture: Secondary | ICD-10-CM | POA: Diagnosis not present

## 2024-09-21 DIAGNOSIS — K683 Retroperitoneal hematoma: Secondary | ICD-10-CM

## 2024-09-21 DIAGNOSIS — Z79899 Other long term (current) drug therapy: Secondary | ICD-10-CM | POA: Diagnosis not present

## 2024-09-21 DIAGNOSIS — T797XXA Traumatic subcutaneous emphysema, initial encounter: Secondary | ICD-10-CM | POA: Insufficient documentation

## 2024-09-21 DIAGNOSIS — Z950 Presence of cardiac pacemaker: Secondary | ICD-10-CM | POA: Insufficient documentation

## 2024-09-21 DIAGNOSIS — R1012 Left upper quadrant pain: Secondary | ICD-10-CM | POA: Diagnosis not present

## 2024-09-21 DIAGNOSIS — I700 Atherosclerosis of aorta: Secondary | ICD-10-CM | POA: Insufficient documentation

## 2024-09-21 DIAGNOSIS — J90 Pleural effusion, not elsewhere classified: Secondary | ICD-10-CM | POA: Insufficient documentation

## 2024-09-21 DIAGNOSIS — M47812 Spondylosis without myelopathy or radiculopathy, cervical region: Secondary | ICD-10-CM | POA: Diagnosis not present

## 2024-09-21 DIAGNOSIS — S3991XA Unspecified injury of abdomen, initial encounter: Secondary | ICD-10-CM | POA: Diagnosis not present

## 2024-09-21 DIAGNOSIS — M4312 Spondylolisthesis, cervical region: Secondary | ICD-10-CM | POA: Insufficient documentation

## 2024-09-21 DIAGNOSIS — Z7982 Long term (current) use of aspirin: Secondary | ICD-10-CM | POA: Insufficient documentation

## 2024-09-21 DIAGNOSIS — J449 Chronic obstructive pulmonary disease, unspecified: Secondary | ICD-10-CM | POA: Diagnosis not present

## 2024-09-21 DIAGNOSIS — Z7984 Long term (current) use of oral hypoglycemic drugs: Secondary | ICD-10-CM | POA: Diagnosis not present

## 2024-09-21 DIAGNOSIS — J982 Interstitial emphysema: Secondary | ICD-10-CM | POA: Diagnosis not present

## 2024-09-21 DIAGNOSIS — S36892A Contusion of other intra-abdominal organs, initial encounter: Secondary | ICD-10-CM | POA: Insufficient documentation

## 2024-09-21 DIAGNOSIS — S199XXA Unspecified injury of neck, initial encounter: Secondary | ICD-10-CM | POA: Diagnosis not present

## 2024-09-21 DIAGNOSIS — E119 Type 2 diabetes mellitus without complications: Secondary | ICD-10-CM | POA: Insufficient documentation

## 2024-09-21 DIAGNOSIS — S2249XA Multiple fractures of ribs, unspecified side, initial encounter for closed fracture: Secondary | ICD-10-CM | POA: Diagnosis present

## 2024-09-21 DIAGNOSIS — Z041 Encounter for examination and observation following transport accident: Secondary | ICD-10-CM | POA: Diagnosis not present

## 2024-09-21 LAB — URINALYSIS, ROUTINE W REFLEX MICROSCOPIC
Bacteria, UA: NONE SEEN
Bilirubin Urine: NEGATIVE
Glucose, UA: >=500 mg/dL — AB
Ketones, ur: NEGATIVE mg/dL
Leukocytes,Ua: NEGATIVE
Nitrite: NEGATIVE
Protein, ur: NEGATIVE mg/dL
Specific Gravity, Urine: 1.016 (ref 1.005–1.030)
pH: 6 (ref 5.0–8.0)

## 2024-09-21 LAB — COMPREHENSIVE METABOLIC PANEL WITH GFR
ALT: 24 U/L (ref 0–44)
AST: 25 U/L (ref 15–41)
Albumin: 4 g/dL (ref 3.5–5.0)
Alkaline Phosphatase: 94 U/L (ref 38–126)
Anion gap: 10 (ref 5–15)
BUN: 15 mg/dL (ref 8–23)
CO2: 23 mmol/L (ref 22–32)
Calcium: 9.9 mg/dL (ref 8.9–10.3)
Chloride: 106 mmol/L (ref 98–111)
Creatinine, Ser: 1.27 mg/dL — ABNORMAL HIGH (ref 0.61–1.24)
GFR, Estimated: >60 mL/min (ref >60)
Glucose, Bld: 153 mg/dL — ABNORMAL HIGH (ref 70–99)
Potassium: 4.3 mmol/L (ref 3.5–5.1)
Sodium: 139 mmol/L (ref 135–145)
Total Bilirubin: 0.8 mg/dL (ref 0.0–1.2)
Total Protein: 7.1 g/dL (ref 6.5–8.1)

## 2024-09-21 LAB — CREATININE, SERUM
Creatinine, Ser: 1.12 mg/dL (ref 0.61–1.24)
GFR, Estimated: >60 mL/min (ref >60)

## 2024-09-21 LAB — CBC
HCT: 47.1 % (ref 39.0–52.0)
HCT: 42.2 % (ref 39.0–52.0)
Hemoglobin: 15.5 g/dL (ref 13.0–17.0)
Hemoglobin: 14.1 g/dL (ref 13.0–17.0)
MCH: 30.1 pg (ref 26.0–34.0)
MCH: 30.2 pg (ref 26.0–34.0)
MCHC: 32.9 g/dL (ref 30.0–36.0)
MCHC: 33.4 g/dL (ref 30.0–36.0)
MCV: 91.5 fL (ref 80.0–100.0)
MCV: 90.4 fL (ref 80.0–100.0)
Platelets: 164 K/uL (ref 150–400)
Platelets: 156 K/uL (ref 150–400)
RBC: 5.15 MIL/uL (ref 4.22–5.81)
RBC: 4.67 MIL/uL (ref 4.22–5.81)
RDW: 12.1 % (ref 11.5–15.5)
RDW: 12.2 % (ref 11.5–15.5)
WBC: 6.5 K/uL (ref 4.0–10.5)
WBC: 12.9 K/uL — ABNORMAL HIGH (ref 4.0–10.5)
nRBC: 0 % (ref 0.0–0.2)
nRBC: 0 % (ref 0.0–0.2)

## 2024-09-21 LAB — I-STAT CHEM 8, ED
BUN: 18 mg/dL (ref 8–23)
Calcium, Ion: 1.35 mmol/L (ref 1.15–1.40)
Chloride: 107 mmol/L (ref 98–111)
Creatinine, Ser: 1.2 mg/dL (ref 0.61–1.24)
Glucose, Bld: 154 mg/dL — ABNORMAL HIGH (ref 70–99)
HCT: 45 % (ref 39.0–52.0)
Hemoglobin: 15.3 g/dL (ref 13.0–17.0)
Potassium: 4.3 mmol/L (ref 3.5–5.1)
Sodium: 143 mmol/L (ref 135–145)
TCO2: 22 mmol/L (ref 22–32)

## 2024-09-21 LAB — I-STAT CG4 LACTIC ACID, ED: Lactic Acid, Venous: 2.6 mmol/L (ref 0.5–1.9)

## 2024-09-21 LAB — GLUCOSE, CAPILLARY
Glucose-Capillary: 203 mg/dL — ABNORMAL HIGH (ref 70–99)
Glucose-Capillary: 139 mg/dL — ABNORMAL HIGH (ref 70–99)

## 2024-09-21 LAB — SAMPLE TO BLOOD BANK

## 2024-09-21 LAB — PROTIME-INR
INR: 1 (ref 0.8–1.2)
Prothrombin Time: 13.3 s (ref 11.4–15.2)

## 2024-09-21 LAB — ETHANOL: Alcohol, Ethyl (B): <15 mg/dL (ref <15)

## 2024-09-21 LAB — CBG MONITORING, ED: Glucose-Capillary: 121 mg/dL — ABNORMAL HIGH (ref 70–99)

## 2024-09-21 MED ORDER — ENOXAPARIN SODIUM 30 MG/0.3ML IJ SOSY
30.0000 mg | PREFILLED_SYRINGE | Freq: Two times a day (BID) | INTRAMUSCULAR | Status: DC
  Administered 2024-09-23 – 2024-09-24 (×3): 30 mg via SUBCUTANEOUS
  Filled 2024-09-21 (×3): qty 0.3

## 2024-09-21 MED ORDER — ONDANSETRON HCL 4 MG/2ML IJ SOLN
4.0000 mg | Freq: Once | INTRAMUSCULAR | Status: AC
  Administered 2024-09-21: 4 mg via INTRAVENOUS
  Filled 2024-09-21: qty 2

## 2024-09-21 MED ORDER — ONDANSETRON HCL 4 MG/2ML IJ SOLN: 4.0000 mg | Freq: Four times a day (QID) | INTRAMUSCULAR | Status: DC | PRN

## 2024-09-21 MED ORDER — METHOCARBAMOL 500 MG PO TABS
500.0000 mg | ORAL_TABLET | Freq: Three times a day (TID) | ORAL | Status: AC
  Administered 2024-09-21 – 2024-09-24 (×9): 500 mg via ORAL
  Filled 2024-09-21 (×10): qty 1

## 2024-09-21 MED ORDER — ASPIRIN 81 MG PO TBEC
81.0000 mg | DELAYED_RELEASE_TABLET | Freq: Every day | ORAL | Status: DC
  Administered 2024-09-21 – 2024-09-24 (×4): 81 mg via ORAL
  Filled 2024-09-21 (×4): qty 1

## 2024-09-21 MED ORDER — METOPROLOL TARTRATE 5 MG/5ML IV SOLN: 5.0000 mg | Freq: Four times a day (QID) | INTRAVENOUS | Status: DC | PRN

## 2024-09-21 MED ORDER — DOCUSATE SODIUM 100 MG PO CAPS
100.0000 mg | ORAL_CAPSULE | Freq: Two times a day (BID) | ORAL | Status: DC
Start: 2024-09-21 — End: 2024-09-24
  Administered 2024-09-21 – 2024-09-24 (×7): 100 mg via ORAL
  Filled 2024-09-21 (×7): qty 1

## 2024-09-21 MED ORDER — UMECLIDINIUM-VILANTEROL 62.5-25 MCG/ACT IN AEPB
1.0000 | INHALATION_SPRAY | Freq: Every day | RESPIRATORY_TRACT | Status: DC
  Administered 2024-09-22 – 2024-09-24 (×3): 1 via RESPIRATORY_TRACT
  Filled 2024-09-21: qty 14

## 2024-09-21 MED ORDER — ONDANSETRON 4 MG PO TBDP: 4.0000 mg | ORAL_TABLET | Freq: Four times a day (QID) | ORAL | Status: DC | PRN

## 2024-09-21 MED ORDER — EZETIMIBE 10 MG PO TABS
10.0000 mg | ORAL_TABLET | Freq: Every day | ORAL | Status: DC
Start: 2024-09-21 — End: 2024-09-24
  Administered 2024-09-21 – 2024-09-24 (×4): 10 mg via ORAL
  Filled 2024-09-21 (×4): qty 1

## 2024-09-21 MED ORDER — ACETAMINOPHEN 500 MG PO TABS
1000.0000 mg | ORAL_TABLET | Freq: Four times a day (QID) | ORAL | Status: DC
Start: 2024-09-21 — End: 2024-09-24
  Administered 2024-09-21 – 2024-09-24 (×9): 1000 mg via ORAL
  Filled 2024-09-21 (×9): qty 2

## 2024-09-21 MED ORDER — OXYCODONE HCL 5 MG PO TABS
5.0000 mg | ORAL_TABLET | ORAL | Status: DC | PRN
  Administered 2024-09-21 – 2024-09-22 (×2): 5 mg via ORAL
  Filled 2024-09-21 (×3): qty 1

## 2024-09-21 MED ORDER — MORPHINE SULFATE (PF) 2 MG/ML IV SOLN: 2.0000 mg | INTRAVENOUS | Status: DC | PRN

## 2024-09-21 MED ORDER — MELATONIN 3 MG PO TABS: 3.0000 mg | ORAL_TABLET | Freq: Every evening | ORAL | Status: DC | PRN

## 2024-09-21 MED ORDER — POLYETHYLENE GLYCOL 3350 17 G PO PACK: 17.0000 g | PACK | Freq: Every day | ORAL | Status: DC | PRN

## 2024-09-21 MED ORDER — HYDRALAZINE HCL 20 MG/ML IJ SOLN: 10.0000 mg | INTRAMUSCULAR | Status: DC | PRN

## 2024-09-21 MED ORDER — IPRATROPIUM-ALBUTEROL 0.5-2.5 (3) MG/3ML IN SOLN: 3.0000 mL | Freq: Four times a day (QID) | RESPIRATORY_TRACT | Status: DC | PRN

## 2024-09-21 MED ORDER — AMLODIPINE BESYLATE 10 MG PO TABS
10.0000 mg | ORAL_TABLET | Freq: Every day | ORAL | Status: DC
  Administered 2024-09-21 – 2024-09-24 (×4): 10 mg via ORAL
  Filled 2024-09-21: qty 1
  Filled 2024-09-21: qty 2
  Filled 2024-09-21 (×2): qty 1

## 2024-09-21 MED ORDER — METOPROLOL TARTRATE 50 MG PO TABS
50.0000 mg | ORAL_TABLET | Freq: Two times a day (BID) | ORAL | Status: DC
  Administered 2024-09-21 – 2024-09-24 (×7): 50 mg via ORAL
  Filled 2024-09-21: qty 2
  Filled 2024-09-21 (×6): qty 1

## 2024-09-21 MED ORDER — INSULIN ASPART 100 UNIT/ML IJ SOLN
0.0000 [IU] | Freq: Three times a day (TID) | INTRAMUSCULAR | Status: DC
  Administered 2024-09-21: 5 [IU] via SUBCUTANEOUS
  Administered 2024-09-21: 2 [IU] via SUBCUTANEOUS
  Administered 2024-09-22 (×2): 3 [IU] via SUBCUTANEOUS
  Administered 2024-09-22: 5 [IU] via SUBCUTANEOUS
  Administered 2024-09-23: 2 [IU] via SUBCUTANEOUS
  Administered 2024-09-23: 5 [IU] via SUBCUTANEOUS
  Administered 2024-09-24: 3 [IU] via SUBCUTANEOUS

## 2024-09-21 MED ORDER — MORPHINE SULFATE (PF) 4 MG/ML IV SOLN
4.0000 mg | Freq: Once | INTRAVENOUS | Status: AC
  Administered 2024-09-21: 4 mg via INTRAVENOUS
  Filled 2024-09-21: qty 1

## 2024-09-21 MED ORDER — IOHEXOL 350 MG/ML SOLN
75.0000 mL | Freq: Once | INTRAVENOUS | Status: AC | PRN
  Administered 2024-09-21: 75 mL via INTRAVENOUS

## 2024-09-21 MED ORDER — METHOCARBAMOL 1000 MG/10ML IJ SOLN: 500.0000 mg | Freq: Three times a day (TID) | INTRAMUSCULAR | Status: AC
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
## 2024-09-21 NOTE — ED Notes (Addendum)
Trauma Provider at bedside.  

## 2024-09-21 NOTE — ED Notes (Signed)
 Patient transported to CT
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
## 2024-09-21 NOTE — Progress Notes (Signed)
 Remote PPM Transmission
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
## 2024-09-22 ENCOUNTER — Observation Stay (HOSPITAL_COMMUNITY)

## 2024-09-22 DIAGNOSIS — J942 Hemothorax: Secondary | ICD-10-CM | POA: Diagnosis not present

## 2024-09-22 DIAGNOSIS — S2242XA Multiple fractures of ribs, left side, initial encounter for closed fracture: Secondary | ICD-10-CM | POA: Diagnosis not present

## 2024-09-22 DIAGNOSIS — R918 Other nonspecific abnormal finding of lung field: Secondary | ICD-10-CM | POA: Diagnosis not present

## 2024-09-22 DIAGNOSIS — Z950 Presence of cardiac pacemaker: Secondary | ICD-10-CM | POA: Diagnosis not present

## 2024-09-22 DIAGNOSIS — E1141 Type 2 diabetes mellitus with diabetic mononeuropathy: Secondary | ICD-10-CM | POA: Diagnosis not present

## 2024-09-22 LAB — BASIC METABOLIC PANEL WITH GFR
Anion gap: 11 (ref 5–15)
BUN: 20 mg/dL (ref 8–23)
CO2: 21 mmol/L — ABNORMAL LOW (ref 22–32)
Calcium: 10 mg/dL (ref 8.9–10.3)
Chloride: 108 mmol/L (ref 98–111)
Creatinine, Ser: 1.2 mg/dL (ref 0.61–1.24)
GFR, Estimated: >60 mL/min (ref >60)
Glucose, Bld: 142 mg/dL — ABNORMAL HIGH (ref 70–99)
Potassium: 3.8 mmol/L (ref 3.5–5.1)
Sodium: 140 mmol/L (ref 135–145)

## 2024-09-22 LAB — GLUCOSE, CAPILLARY
Glucose-Capillary: 126 mg/dL — ABNORMAL HIGH (ref 70–99)
Glucose-Capillary: 141 mg/dL — ABNORMAL HIGH (ref 70–99)
Glucose-Capillary: 146 mg/dL — ABNORMAL HIGH (ref 70–99)
Glucose-Capillary: 164 mg/dL — ABNORMAL HIGH (ref 70–99)
Glucose-Capillary: 198 mg/dL — ABNORMAL HIGH (ref 70–99)
Glucose-Capillary: 212 mg/dL — ABNORMAL HIGH (ref 70–99)

## 2024-09-22 LAB — CBC
HCT: 42.7 % (ref 39.0–52.0)
Hemoglobin: 14.2 g/dL (ref 13.0–17.0)
MCH: 30 pg (ref 26.0–34.0)
MCHC: 33.3 g/dL (ref 30.0–36.0)
MCV: 90.3 fL (ref 80.0–100.0)
Platelets: 170 K/uL (ref 150–400)
RBC: 4.73 MIL/uL (ref 4.22–5.81)
RDW: 12.2 % (ref 11.5–15.5)
WBC: 10.8 K/uL — ABNORMAL HIGH (ref 4.0–10.5)
nRBC: 0 % (ref 0.0–0.2)
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
## 2024-09-22 NOTE — Plan of Care (Signed)
  Problem: Coping: Goal: Ability to adjust to condition or change in health will improve Outcome: Progressing   Problem: Fluid Volume: Goal: Ability to maintain a balanced intake and output will improve Outcome: Progressing   Problem: Metabolic: Goal: Ability to maintain appropriate glucose levels will improve Outcome: Progressing   Problem: Health Behavior/Discharge Planning: Goal: Ability to manage health-related needs will improve Outcome: Progressing   Problem: Clinical Measurements: Goal: Will remain free from infection Outcome: Progressing Goal: Diagnostic test results will improve Outcome: Progressing

## 2024-09-22 NOTE — Care Management Obs Status (Signed)
 MEDICARE OBSERVATION STATUS NOTIFICATION   Patient Details  Name: Jonathan Brown MRN: [id number] Date of Birth: July 27, 1951   Medicare Observation Status Notification Given:  Yes    Robynn Eileen Hoose, RN 09/22/2024, 10:07 AM

## 2024-09-22 NOTE — Plan of Care (Signed)
  Problem: Pain Managment: Goal: General experience of comfort will improve and/or be controlled Outcome: Progressing
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
## 2024-09-22 NOTE — Plan of Care (Signed)
  Problem: Pain Managment: Goal: General experience of comfort will improve and/or be controlled Outcome: Progressing   Problem: Safety: Goal: Ability to remain free from injury will improve Outcome: Progressing

## 2024-09-23 ENCOUNTER — Observation Stay (HOSPITAL_COMMUNITY)

## 2024-09-23 DIAGNOSIS — Z950 Presence of cardiac pacemaker: Secondary | ICD-10-CM | POA: Diagnosis not present

## 2024-09-23 DIAGNOSIS — S2242XA Multiple fractures of ribs, left side, initial encounter for closed fracture: Secondary | ICD-10-CM | POA: Diagnosis not present

## 2024-09-23 DIAGNOSIS — E1141 Type 2 diabetes mellitus with diabetic mononeuropathy: Secondary | ICD-10-CM | POA: Diagnosis not present

## 2024-09-23 DIAGNOSIS — S2249XA Multiple fractures of ribs, unspecified side, initial encounter for closed fracture: Secondary | ICD-10-CM | POA: Diagnosis not present

## 2024-09-23 DIAGNOSIS — R062 Wheezing: Secondary | ICD-10-CM | POA: Diagnosis not present

## 2024-09-23 DIAGNOSIS — J90 Pleural effusion, not elsewhere classified: Secondary | ICD-10-CM | POA: Diagnosis not present

## 2024-09-23 LAB — GLUCOSE, CAPILLARY
Glucose-Capillary: 202 mg/dL — ABNORMAL HIGH (ref 70–99)
Glucose-Capillary: 125 mg/dL — ABNORMAL HIGH (ref 70–99)
Glucose-Capillary: 102 mg/dL — ABNORMAL HIGH (ref 70–99)
Glucose-Capillary: 133 mg/dL — ABNORMAL HIGH (ref 70–99)

## 2024-09-23 LAB — BASIC METABOLIC PANEL WITH GFR
Anion gap: 10 (ref 5–15)
BUN: 23 mg/dL (ref 8–23)
CO2: 20 mmol/L — ABNORMAL LOW (ref 22–32)
Calcium: 9.6 mg/dL (ref 8.9–10.3)
Chloride: 109 mmol/L (ref 98–111)
Creatinine, Ser: 1.11 mg/dL (ref 0.61–1.24)
GFR, Estimated: >60 mL/min (ref >60)
Glucose, Bld: 129 mg/dL — ABNORMAL HIGH (ref 70–99)
Potassium: 3.7 mmol/L (ref 3.5–5.1)
Sodium: 139 mmol/L (ref 135–145)

## 2024-09-23 LAB — CBC
HCT: 38.1 % — ABNORMAL LOW (ref 39.0–52.0)
Hemoglobin: 12.9 g/dL — ABNORMAL LOW (ref 13.0–17.0)
MCH: 30.1 pg (ref 26.0–34.0)
MCHC: 33.9 g/dL (ref 30.0–36.0)
MCV: 88.8 fL (ref 80.0–100.0)
Platelets: 142 K/uL — ABNORMAL LOW (ref 150–400)
RBC: 4.29 MIL/uL (ref 4.22–5.81)
RDW: 12.4 % (ref 11.5–15.5)
WBC: 10.3 K/uL (ref 4.0–10.5)
nRBC: 0 % (ref 0.0–0.2)

## 2024-09-23 NOTE — Progress Notes (Signed)
 Incentive spirometer at bedside. Reinforce teaching with return demonstration successfully by the patient.

## 2024-09-23 NOTE — Plan of Care (Signed)
  Problem: Coping: Goal: Ability to adjust to condition or change in health will improve Outcome: Progressing   Problem: Activity: Goal: Risk for activity intolerance will decrease Outcome: Progressing   Problem: Pain Managment: Goal: General experience of comfort will improve and/or be controlled Outcome: Progressing   Problem: Safety: Goal: Ability to remain free from injury will improve Outcome: Progressing
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
## 2024-09-23 NOTE — Progress Notes (Signed)
 Transition of Care [facility]) - CAGE-AID Screening   Patient Details  Name: Jonathan Brown MRN: [id number] Date of Birth: 1951-03-15   Darice CHRISTELLA Rouleau, RN Trauma Response Nurse Phone Number: [phone] 09/23/2024, 5:31 PM  CAGE-AID Screening:    Have You Ever Felt You Ought to Cut Down on Your Drinking or Drug Use?: No Have People Annoyed You By Critizing Your Drinking Or Drug Use?: No Have You Felt Bad Or Guilty About Your Drinking Or Drug Use?: No Have You Ever Had a Drink or Used Drugs First Thing In The Morning to Steady Your Nerves or to Get Rid of a Hangover?: No CAGE-AID Score: 0  Substance Abuse Education Offered: (S) No (No services needed. Drinks 1 drink a month per pt.)

## 2024-09-24 ENCOUNTER — Other Ambulatory Visit (HOSPITAL_COMMUNITY): Payer: Self-pay

## 2024-09-24 DIAGNOSIS — Z950 Presence of cardiac pacemaker: Secondary | ICD-10-CM | POA: Diagnosis not present

## 2024-09-24 DIAGNOSIS — E1141 Type 2 diabetes mellitus with diabetic mononeuropathy: Secondary | ICD-10-CM | POA: Diagnosis not present

## 2024-09-24 LAB — GLUCOSE, CAPILLARY: Glucose-Capillary: 167 mg/dL — ABNORMAL HIGH (ref 70–99)

## 2024-09-24 MED ORDER — ACETAMINOPHEN 500 MG PO TABS
1000.0000 mg | ORAL_TABLET | Freq: Four times a day (QID) | ORAL | 0 refills | Status: AC | PRN
  Filled 2024-09-24: qty 30, 4d supply, fill #0

## 2024-09-24 MED ORDER — OXYCODONE HCL 5 MG PO TABS
5.0000 mg | ORAL_TABLET | ORAL | 0 refills | Status: AC | PRN
  Filled 2024-09-24: qty 30, 5d supply, fill #0

## 2024-09-24 MED ORDER — METHOCARBAMOL 500 MG PO TABS
500.0000 mg | ORAL_TABLET | Freq: Three times a day (TID) | ORAL | 0 refills | Status: AC | PRN
  Filled 2024-09-24: qty 40, 14d supply, fill #0

## 2024-09-24 NOTE — Progress Notes (Signed)
 Discharge Nurse Summary: DC order noted per MD. DC RN at bedside with patient. Patient agreeable with discharge plan, agreeable to transport to dc lounge to await TOC meds and family pickup. AVS printed/reviewed. PIV removed. Telemonitor not present on assessment. No DME needs. TOC meds pending pickup. CP/Edu resolved. Patient dressed and ready to go. All belongings accounted for. Patient wheeled downstairs to [employer].  Rosario EMERSON Lund, RN

## 2024-09-24 NOTE — Plan of Care (Signed)
  Problem: Pain Managment: Goal: General experience of comfort will improve and/or be controlled Outcome: Progressing   Problem: Safety: Goal: Ability to remain free from injury will improve Outcome: Progressing
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
## 2024-09-24 NOTE — Progress Notes (Signed)
 Called back pt wife back [phone] and let her know  that the plan so far is for her husband to go home today. She stated that she just got off the phone with the doctor and their niece will be picking him up later for discharge.

## 2024-10-02 ENCOUNTER — Ambulatory Visit (INDEPENDENT_AMBULATORY_CARE_PROVIDER_SITE_OTHER): Admitting: Medical

## 2024-10-02 VITALS — BP 110/62 | HR 86 | Wt 183.2 lb

## 2024-10-02 DIAGNOSIS — Z23 Encounter for immunization: Secondary | ICD-10-CM

## 2024-10-02 DIAGNOSIS — R0789 Other chest pain: Secondary | ICD-10-CM | POA: Diagnosis not present

## 2024-10-02 DIAGNOSIS — K683 Retroperitoneal hematoma: Secondary | ICD-10-CM | POA: Diagnosis not present

## 2024-10-02 DIAGNOSIS — S2242XD Multiple fractures of ribs, left side, subsequent encounter for fracture with routine healing: Secondary | ICD-10-CM | POA: Diagnosis not present

## 2024-10-02 NOTE — Patient Instructions (Signed)
 You have 3 medications to help with the pain from the broken ribs  Use the Acetaminophen  Tylenol  500mg , 1 tablet 3 times daily for another week such as morning, lunch and evening, then as needed  Use the Robaxin  muscle relaxer 500mg  at bedtime, or up to twice daily for muscle soreness and stiffness  Use the Oxycodone  5mg  one or twice daily if the pain is more severe.  Use this at bedtime to help with the pain that seems to be worse at bedtime  After another week, then just use the medicaiton AS NEEDED

## 2024-10-02 NOTE — Addendum Note (Signed)
 Addended by: BULAH ALM RAMAN on: 10/02/2024 01:09 PM   Modules accepted: Level of Service
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
## 2024-10-23 ENCOUNTER — Telehealth: Payer: Self-pay

## 2024-10-23 ENCOUNTER — Telehealth: Payer: Self-pay | Admitting: Internal Medicine

## 2024-10-23 MED ORDER — VALSARTAN 160 MG PO TABS: 160.0000 mg | ORAL_TABLET | Freq: Every day | ORAL | 1 refills | Status: AC

## 2024-10-23 NOTE — Telephone Encounter (Signed)
-----   Message from Jon VEAR Lindau sent at 10/23/2024  3:33 PM EDT ----- Hello,  Pharmacy requesting refill on behalf of the patient for the following prescription:  Valsartan  160mg  1 tablet by mouth daily  Pharmacy Info: [id number] - [place], [place] - [address] P: [phone] F: [phone]  Thank you! Jon VEAR Lindau, PharmD Clinical Pharmacist [phone]

## 2024-10-23 NOTE — Progress Notes (Signed)
   10/23/2024  Patient ID: Charlie KANDICE Gunner, male   DOB: April 16, 1951, 73 y.o.   MRN: [id number]  Pharmacy Quality Measure Review  This patient is appearing on a report for being at risk of failing the adherence measure for hypertension (ACEi/ARB) medications this calendar year.   Medication: Valsartan  160mg  Last fill date: 06/21/24 for 90 day supply  Out of refills at pharmacy. Will collaborate with provider to facilitate refill needs.  Jon VEAR Lindau, PharmD Clinical Pharmacist [phone]

## 2024-10-23 NOTE — Telephone Encounter (Signed)
 Sent in medication

## 2024-10-25 ENCOUNTER — Other Ambulatory Visit: Payer: Self-pay | Admitting: Cardiology

## 2024-10-25 DIAGNOSIS — I10 Essential (primary) hypertension: Secondary | ICD-10-CM

## 2024-11-02 ENCOUNTER — Ambulatory Visit: Payer: PPO

## 2024-11-02 DIAGNOSIS — I442 Atrioventricular block, complete: Secondary | ICD-10-CM

## 2024-11-04 LAB — CUP PACEART REMOTE DEVICE CHECK
Battery Remaining Longevity: 32 mo
Battery Voltage: 2.91 V
Brady Statistic AP VP Percent: 8.22 %
Brady Statistic AP VS Percent: 0 %
Brady Statistic AS VP Percent: 91.75 %
Brady Statistic AS VS Percent: 0.03 %
Brady Statistic RA Percent Paced: 8.22 %
Brady Statistic RV Percent Paced: 99.97 %
Date Time Interrogation Session: 20251107014549
Implantable Lead Connection Status: 753985
Implantable Lead Connection Status: 753985
Implantable Lead Implant Date: 20181008
Implantable Lead Implant Date: 20181008
Implantable Lead Location: 753859
Implantable Lead Location: 753860
Implantable Lead Model: 3830
Implantable Lead Model: 5076
Implantable Pulse Generator Implant Date: 20181008
Lead Channel Impedance Value: 304 Ohm
Lead Channel Impedance Value: 323 Ohm
Lead Channel Impedance Value: 456 Ohm
Lead Channel Impedance Value: 494 Ohm
Lead Channel Pacing Threshold Amplitude: 0.75 V
Lead Channel Pacing Threshold Amplitude: 1 V
Lead Channel Pacing Threshold Pulse Width: 0.4 ms
Lead Channel Pacing Threshold Pulse Width: 0.4 ms
Lead Channel Sensing Intrinsic Amplitude: 11.5 mV
Lead Channel Sensing Intrinsic Amplitude: 11.5 mV
Lead Channel Sensing Intrinsic Amplitude: 2.75 mV
Lead Channel Sensing Intrinsic Amplitude: 2.75 mV
Lead Channel Setting Pacing Amplitude: 2 V
Lead Channel Setting Pacing Amplitude: 2 V
Lead Channel Setting Pacing Pulse Width: 0.4 ms
Lead Channel Setting Sensing Sensitivity: 2 mV
Zone Setting Status: 755011
Zone Setting Status: 755011

## 2024-11-05 ENCOUNTER — Ambulatory Visit: Payer: Self-pay | Admitting: Internal Medicine

## 2024-11-06 ENCOUNTER — Telehealth: Payer: Self-pay

## 2024-11-06 NOTE — Progress Notes (Signed)
   11/06/2024  Patient ID: Jonathan Brown, male   DOB: October 11, 1951, 73 y.o.   MRN: [id number]  Pharmacy Quality Measure Review  This patient is appearing on a report for being at risk of failing the adherence measure for cholesterol (statin) medications this calendar year.   Medication: Rosuvastatin  Last fill date: 06/21/24 for 90 day supply  Left voicemail for patient to return my call at their convenience.  Jon VEAR Lindau, PharmD Clinical Pharmacist [phone]

## 2024-11-06 NOTE — Progress Notes (Signed)
 Remote PPM Transmission

## 2024-11-26 ENCOUNTER — Encounter

## 2024-11-26 ENCOUNTER — Ambulatory Visit

## 2024-11-28 ENCOUNTER — Ambulatory Visit: Admitting: Podiatry

## 2024-11-28 ENCOUNTER — Encounter: Payer: Self-pay | Admitting: Podiatry

## 2024-11-28 DIAGNOSIS — M79609 Pain in unspecified limb: Secondary | ICD-10-CM | POA: Diagnosis not present

## 2024-11-28 DIAGNOSIS — B351 Tinea unguium: Secondary | ICD-10-CM

## 2024-11-28 DIAGNOSIS — I739 Peripheral vascular disease, unspecified: Secondary | ICD-10-CM | POA: Diagnosis not present

## 2024-11-28 DIAGNOSIS — E118 Type 2 diabetes mellitus with unspecified complications: Secondary | ICD-10-CM | POA: Diagnosis not present

## 2024-11-28 NOTE — Progress Notes (Signed)
 This patient returns to my office for at risk foot care.  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  This patient is unable to cut nails himself since the patient cannot reach his nails.These nails are painful walking and wearing shoes.  This patient presents for at risk foot care today.  General Appearance  Alert, conversant and in no acute stress.  Vascular  Dorsalis pedis and posterior tibial  pulses are  weakly palpable  bilaterally.  Capillary return is within normal limits  bilaterally. Temperature is within normal limits  bilaterally.  Neurologic  Senn-Weinstein monofilament wire test within normal limits  bilaterally. Muscle power within normal limits bilaterally.  Nails Thick disfigured discolored nails with subungual debris  from hallux to fifth toes bilaterally. No evidence of bacterial infection or drainage bilaterally.  Orthopedic  No limitations of motion  feet .  No crepitus or effusions noted.  No bony pathology or digital deformities noted.  Skin  normotropic skin with no porokeratosis noted bilaterally.  No signs of infections or ulcers noted.     Onychomycosis  Pain in right toes  Pain in left toes  Consent was obtained for treatment procedures.   Mechanical debridement of nails 1-5  bilaterally performed with a nail nipper.  Filed with dremel without incident.    Return office visit    3 months                 Told patient to return for periodic foot care and evaluation due to potential at risk complications.   Helane Gunther DPM

## 2024-11-30 ENCOUNTER — Ambulatory Visit: Attending: Cardiology | Admitting: Cardiology

## 2024-11-30 ENCOUNTER — Encounter: Payer: Self-pay | Admitting: Cardiology

## 2024-11-30 VITALS — BP 136/70 | HR 79 | Ht 69.0 in | Wt 182.6 lb

## 2024-11-30 DIAGNOSIS — Z952 Presence of prosthetic heart valve: Secondary | ICD-10-CM | POA: Diagnosis not present

## 2024-11-30 DIAGNOSIS — I442 Atrioventricular block, complete: Secondary | ICD-10-CM

## 2024-11-30 DIAGNOSIS — I10 Essential (primary) hypertension: Secondary | ICD-10-CM | POA: Diagnosis not present

## 2024-11-30 NOTE — Progress Notes (Unsigned)
 Cardiology Office Note:  .   Date:  12/01/2024  ID:  Jonathan Brown, DOB April 20, 1951, MRN [id number] PCP: Bulah Alm GORMAN DEVONNA  [facility] Providers Cardiologist:  Gordy Bergamo, MD Electrophysiologist:  Danelle Birmingham, MD   History of Present Illness: .   Jonathan Brown is a 73 y.o. AA male patient status post aortic valve replacement using 21 mm pericardial Magna Ease valve for severe aortic stenosis 09/29/2017, Medtronic dual-chamber pacemaker for complete heart block status post aortic valve replacement 10/03/2017, hypertension, DM with chronic stage IIIa kidney disease, mixed hyperlipidemia, PAD. 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    He is asymptomatic    Discussed the use of AI scribe software for clinical note transcription with the patient, who gave verbal consent to proceed.  History of Present Illness Jonathan Brown is a 73 year old male with a history of pacemaker and aortic valve replacement who presents for cardiovascular follow-up.  His pacemaker is functioning well with about three years of battery life remaining.  A recent echocardiogram showed his bioprosthetic aortic valve is functioning well. He takes amlodipine  10 mg daily, metoprolol  50 mg twice daily, and valsartan  160 mg daily for blood pressure, as well as Crestor  40 mg daily, Zetia  10 mg daily, and baby aspirin  for his valve. His LDL was 45 in May.  He notes some memory issues, including forgetting names earlier today.  He reports decreased energy and strength, with difficulty rising from a chair and reduced ability to carry out activities compared with prior years.  Cardiac Studies relevent.    ECHOCARDIOGRAM COMPLETE 11/24/2022  Normal LV systolic function with visual EF 55-60%. Left ventricle cavity is normal in size. Mild concentric hypertrophy of the left ventricle. Normal global wall motion. Doppler evidence of grade II (pseudonormal) diastolic dysfunction,  elevated LAP. Calculated EF 62%. S/p 21-mm pericardial Magna Ease valve replacement (2018). Trace aortic regurgitation. AVA (VTI) measures 1.5 cm^2. AV Mean Grad measures 21.5 mmHg. AV Pk Vel measures 3.45 m/s.    The aortic root is normal. Mildly dilated ascending aorta at 4 cm.   CARDIAC CATHETERIZATION 07/12/2017    Labs   Lab Results  Component Value Date   CHOL 108 05/23/2024   HDL 33 (L) 05/23/2024   LDLCALC 45 05/23/2024   TRIG 185 (H) 05/23/2024   CHOLHDL 3.3 05/23/2024   No results found for: LIPOA  Recent Labs    09/21/24 0831 09/21/24 0839 09/21/24 1152 09/22/24 0530 09/23/24 0251  NA 139 143  --  140 139  K 4.3 4.3  --  3.8 3.7  CL 106 107  --  108 109  CO2 23  --   --  21* 20*  GLUCOSE 153* 154*  --  142* 129*  BUN 15 18  --  20 23  CREATININE 1.27* 1.20 1.12 1.20 1.11  CALCIUM  9.9  --   --  10.0 9.6  GFRNONAA >60  --  >60 >60 >60    Lab Results  Component Value Date   ALT 24 09/21/2024   AST 25 09/21/2024   ALKPHOS 94 09/21/2024   BILITOT 0.8 09/21/2024      Latest Ref Rng & Units 09/23/2024    2:51 AM 09/22/2024    5:30 AM 09/21/2024   11:52 AM  CBC  WBC 4.0 - 10.5 K/uL 10.3  10.8  12.9   Hemoglobin 13.0 - 17.0 g/dL 87.0  85.7  85.8   Hematocrit  39.0 - 52.0 % 38.1  42.7  42.2   Platelets 150 - 400 K/uL 142  170  156    Lab Results  Component Value Date   HGBA1C 6.0 (H) 05/23/2024    Lab Results  Component Value Date   TSH 1.510 08/23/2023     ROS  Review of Systems  Cardiovascular:  Negative for chest pain, dyspnea on exertion and leg swelling.   Physical Exam:   VS:  BP 136/70 (BP Location: Left Arm, Patient Position: Sitting, Cuff Size: Normal)   Pulse 79   Ht 5' 9 (1.753 m)   Wt 182 lb 9.6 oz (82.8 kg)   SpO2 96%   BMI 26.97 kg/m    Wt Readings from Last 3 Encounters:  11/30/24 182 lb 9.6 oz (82.8 kg)  10/02/24 183 lb 3.2 oz (83.1 kg)  09/21/24 190 lb 7.6 oz (86.4 kg)    BP Readings from Last 3 Encounters:  11/30/24  136/70  10/02/24 110/62  09/24/24 (!) 146/69   Physical Exam Neck:     Vascular: No JVD.  Cardiovascular:     Rate and Rhythm: Normal rate and regular rhythm.     Pulses: Intact distal pulses.          Carotid pulses are  on the right side with bruit and  on the left side with bruit.      Dorsalis pedis pulses are 0 on the right side and 0 on the left side.       Posterior tibial pulses are 2+ on the right side and 2+ on the left side.     Heart sounds: S1 normal and S2 normal. Murmur heard.     Early systolic murmur is present with a grade of 2/6 at the upper right sternal border.     No gallop.  Pulmonary:     Effort: Pulmonary effort is normal.     Breath sounds: Normal breath sounds.  Abdominal:     General: Bowel sounds are normal.     Palpations: Abdomen is soft.  Musculoskeletal:     Right lower leg: No edema.     Left lower leg: No edema.    EKG:         ASSESSMENT AND PLAN: .      ICD-10-CM   1. Essential hypertension  I10     2. S/P AVR with 21 mm Magna Ease Pericardial Tissue Valve,  09/18/2017: MYRTIS Grace, MD  Z95.2     3. CHB (complete heart block) (HCC)  I44.2       Remote pacemaker interrogation 11/05/2024: A sensed, V paced rhythm.  Battery parameters are normal.  1 high rate episode for 6 minutes and 54 seconds consistent with brief atrial tachycardia.  Battery life 2 years and 8 months. Assessment & Plan Status post aortic valve replacement with prosthetic valve Echocardiogram shows the prosthetic valve is functioning well with good heart function. - Continue current management.  Complete heart block with pacemaker Pacemaker is functioning well with approximately three years of battery life remaining. - Continue annual follow-up with electrophysiology for pacemaker management. - No AF episodes.   Heart failure with preserved ejection fraction Echocardiogram indicates good heart function with preserved ejection fraction, but the heart is stiff  and not relaxing well. - Encouraged daily exercise, such as walking or using a stationary bicycle, for 20-30 minutes continuously.  Essential hypertension Blood pressure is well controlled with current medication regimen. - Continue amlodipine  10 mg once  daily. - Continue metoprolol  50 mg twice daily. - Continue valsartan  160 mg once daily.  Hyperlipidemia Cholesterol levels are well controlled with current medication regimen. LDL was 45 in May of this year. - Continue Crestor  40 mg once daily. - Continue Zetia  10 mg once daily.   Follow up: With APP for Prosthetic AV, Hypertensino and hyperlipidemia and complete heart block and see me PRN  Signed,  Gordy Bergamo, MD, [facility] 12/01/2024, 8:50 AM [facility] [address] [place], [place] [address] Phone: [phone]. Fax:  [phone]

## 2024-11-30 NOTE — Patient Instructions (Signed)
 Medication Instructions:  Your physician recommends that you continue on your current medications as directed. Please refer to the Current Medication list given to you today.  *If you need a refill on your cardiac medications before your next appointment, please call your pharmacy*  Follow-Up: At [facility], you and your health needs are our priority.  As part of our continuing mission to provide you with exceptional heart care, our providers are all part of one team.  This team includes your primary Cardiologist (physician) and Advanced Practice Providers or APPs (Physician Assistants and Nurse Practitioners) who all work together to provide you with the care you need, when you need it.  Your next appointment:   1 year(s)  Provider:   One of our Advanced Practice Providers (APPs): Morse Clause, PA-C  Lamarr Satterfield, NP Miriam Shams, NP  Olivia Pavy, PA-C Josefa Beauvais, NP  Leontine Salen, PA-C Orren Fabry, PA-C  [place], PA-C Ernest Dick, NP  Damien Braver, NP Jon Hails, PA-C  Waddell Donath, PA-C    Dayna Dunn, PA-C  Scott Weaver, PA-C Lum Louis, NP Katlyn West, NP Callie Goodrich, PA-C  Xika Zhao, NP Sheng Haley, PA-C    Kathleen Johnson, PA-C

## 2024-12-01 ENCOUNTER — Other Ambulatory Visit: Payer: Self-pay | Admitting: Medical

## 2025-01-19 ENCOUNTER — Other Ambulatory Visit: Payer: Self-pay | Admitting: Cardiology

## 2025-01-19 DIAGNOSIS — I10 Essential (primary) hypertension: Secondary | ICD-10-CM

## 2025-01-29 ENCOUNTER — Other Ambulatory Visit: Payer: Self-pay | Admitting: Medical

## 2025-01-29 DIAGNOSIS — E782 Mixed hyperlipidemia: Secondary | ICD-10-CM

## 2025-01-29 NOTE — Telephone Encounter (Signed)
 Pt has upcoming appt this month.

## 2025-02-05 ENCOUNTER — Ambulatory Visit

## 2025-02-05 ENCOUNTER — Encounter

## 2025-02-06 ENCOUNTER — Ambulatory Visit: Admitting: Medical

## 2025-02-26 ENCOUNTER — Ambulatory Visit: Admitting: Podiatry
